# Patient Record
Sex: Female | Born: 1955 | Race: White | Hispanic: No | Marital: Married | Smoking: Current every day smoker
Health system: Southern US, Community
[De-identification: ages and names within clinical notes are randomized; demographics above are authoritative.]

## PROBLEM LIST (undated history)

## (undated) DIAGNOSIS — J302 Other seasonal allergic rhinitis: Secondary | ICD-10-CM

## (undated) DIAGNOSIS — L409 Psoriasis, unspecified: Secondary | ICD-10-CM

## (undated) DIAGNOSIS — Z85828 Personal history of other malignant neoplasm of skin: Secondary | ICD-10-CM

## (undated) DIAGNOSIS — E782 Mixed hyperlipidemia: Secondary | ICD-10-CM

## (undated) DIAGNOSIS — R112 Nausea with vomiting, unspecified: Secondary | ICD-10-CM

## (undated) DIAGNOSIS — E669 Obesity, unspecified: Secondary | ICD-10-CM

## (undated) DIAGNOSIS — F32A Depression, unspecified: Secondary | ICD-10-CM

## (undated) DIAGNOSIS — G35D Multiple sclerosis, unspecified: Secondary | ICD-10-CM

## (undated) DIAGNOSIS — F419 Anxiety disorder, unspecified: Secondary | ICD-10-CM

## (undated) DIAGNOSIS — M1990 Unspecified osteoarthritis, unspecified site: Secondary | ICD-10-CM

## (undated) DIAGNOSIS — T8859XA Other complications of anesthesia, initial encounter: Secondary | ICD-10-CM

## (undated) DIAGNOSIS — G4730 Sleep apnea, unspecified: Secondary | ICD-10-CM

## (undated) DIAGNOSIS — K8020 Calculus of gallbladder without cholecystitis without obstruction: Secondary | ICD-10-CM

## (undated) DIAGNOSIS — G43909 Migraine, unspecified, not intractable, without status migrainosus: Secondary | ICD-10-CM

## (undated) DIAGNOSIS — T7840XA Allergy, unspecified, initial encounter: Secondary | ICD-10-CM

## (undated) DIAGNOSIS — E119 Type 2 diabetes mellitus without complications: Secondary | ICD-10-CM

## (undated) DIAGNOSIS — K219 Gastro-esophageal reflux disease without esophagitis: Secondary | ICD-10-CM

## (undated) DIAGNOSIS — J449 Chronic obstructive pulmonary disease, unspecified: Secondary | ICD-10-CM

## (undated) DIAGNOSIS — R531 Weakness: Secondary | ICD-10-CM

## (undated) DIAGNOSIS — Z9889 Other specified postprocedural states: Secondary | ICD-10-CM

## (undated) DIAGNOSIS — G629 Polyneuropathy, unspecified: Secondary | ICD-10-CM

## (undated) DIAGNOSIS — G35 Multiple sclerosis: Secondary | ICD-10-CM

## (undated) DIAGNOSIS — G709 Myoneural disorder, unspecified: Secondary | ICD-10-CM

## (undated) DIAGNOSIS — R7303 Prediabetes: Secondary | ICD-10-CM

## (undated) DIAGNOSIS — C801 Malignant (primary) neoplasm, unspecified: Secondary | ICD-10-CM

## (undated) HISTORY — DX: Multiple sclerosis, unspecified: G35.D

## (undated) HISTORY — DX: Gastro-esophageal reflux disease without esophagitis: K21.9

## (undated) HISTORY — DX: Mixed hyperlipidemia: E78.2

## (undated) HISTORY — DX: Other seasonal allergic rhinitis: J30.2

## (undated) HISTORY — DX: Migraine, unspecified, not intractable, without status migrainosus: G43.909

## (undated) HISTORY — DX: Calculus of gallbladder without cholecystitis without obstruction: K80.20

## (undated) HISTORY — DX: Chronic obstructive pulmonary disease, unspecified: J44.9

## (undated) HISTORY — DX: Sleep apnea, unspecified: G47.30

## (undated) HISTORY — DX: Obesity, unspecified: E66.9

## (undated) HISTORY — DX: Allergy, unspecified, initial encounter: T78.40XA

## (undated) HISTORY — DX: Multiple sclerosis: G35

## (undated) HISTORY — DX: Depression, unspecified: F32.A

## (undated) HISTORY — PX: OTHER SURGICAL HISTORY: SHX169

## (undated) HISTORY — DX: Prediabetes: R73.03

## (undated) SURGERY — COLONOSCOPY
Anesthesia: Moderate Sedation

---

## 1982-08-11 HISTORY — PX: ABDOMINAL HYSTERECTOMY: SHX81

## 1983-08-12 HISTORY — PX: OTHER SURGICAL HISTORY: SHX169

## 1988-08-11 HISTORY — PX: OTHER SURGICAL HISTORY: SHX169

## 1997-08-11 HISTORY — PX: CHOLECYSTECTOMY: SHX55

## 1998-05-07 ENCOUNTER — Inpatient Hospital Stay (HOSPITAL_COMMUNITY): Admission: EM | Admit: 1998-05-07 | Discharge: 1998-05-10 | Payer: Self-pay | Admitting: Emergency Medicine

## 1998-11-21 ENCOUNTER — Encounter (HOSPITAL_COMMUNITY): Admission: RE | Admit: 1998-11-21 | Discharge: 1999-02-19 | Payer: Self-pay | Admitting: *Deleted

## 2000-07-25 ENCOUNTER — Encounter: Payer: Self-pay | Admitting: *Deleted

## 2000-07-25 ENCOUNTER — Ambulatory Visit (HOSPITAL_COMMUNITY): Admission: RE | Admit: 2000-07-25 | Discharge: 2000-07-25 | Payer: Self-pay | Admitting: *Deleted

## 2001-08-11 HISTORY — PX: KNEE ARTHROSCOPY: SUR90

## 2003-08-25 ENCOUNTER — Ambulatory Visit (HOSPITAL_COMMUNITY): Admission: RE | Admit: 2003-08-25 | Discharge: 2003-08-25 | Payer: Self-pay | Admitting: Internal Medicine

## 2004-08-13 ENCOUNTER — Emergency Department (HOSPITAL_COMMUNITY): Admission: EM | Admit: 2004-08-13 | Discharge: 2004-08-13 | Payer: Self-pay | Admitting: Emergency Medicine

## 2004-09-19 ENCOUNTER — Ambulatory Visit: Payer: Self-pay | Admitting: Family Medicine

## 2004-10-03 ENCOUNTER — Ambulatory Visit: Payer: Self-pay | Admitting: Family Medicine

## 2004-10-08 ENCOUNTER — Ambulatory Visit: Payer: Self-pay | Admitting: Family Medicine

## 2004-10-11 ENCOUNTER — Ambulatory Visit: Payer: Self-pay | Admitting: Family Medicine

## 2004-10-24 ENCOUNTER — Ambulatory Visit: Payer: Self-pay | Admitting: Family Medicine

## 2005-12-10 ENCOUNTER — Encounter: Payer: Self-pay | Admitting: *Deleted

## 2006-08-11 HISTORY — PX: SHOULDER SURGERY: SHX246

## 2006-12-27 ENCOUNTER — Encounter: Admission: RE | Admit: 2006-12-27 | Discharge: 2006-12-27 | Payer: Self-pay | Admitting: Neurology

## 2007-06-17 ENCOUNTER — Ambulatory Visit: Payer: Self-pay | Admitting: *Deleted

## 2007-07-29 ENCOUNTER — Ambulatory Visit: Payer: Self-pay | Admitting: *Deleted

## 2008-08-11 HISTORY — PX: OTHER SURGICAL HISTORY: SHX169

## 2008-08-14 ENCOUNTER — Encounter: Admission: RE | Admit: 2008-08-14 | Discharge: 2008-08-14 | Payer: Self-pay | Admitting: Sports Medicine

## 2008-09-14 ENCOUNTER — Inpatient Hospital Stay (HOSPITAL_COMMUNITY): Admission: RE | Admit: 2008-09-14 | Discharge: 2008-09-17 | Payer: Self-pay | Admitting: Orthopedic Surgery

## 2010-11-26 LAB — CROSSMATCH
ABO/RH(D): O POS
Antibody Screen: NEGATIVE

## 2010-11-26 LAB — COMPREHENSIVE METABOLIC PANEL
Albumin: 3.5 g/dL (ref 3.5–5.2)
BUN: 8 mg/dL (ref 6–23)
Calcium: 9.2 mg/dL (ref 8.4–10.5)
Chloride: 106 mEq/L (ref 96–112)
Creatinine, Ser: 0.87 mg/dL (ref 0.4–1.2)
GFR calc Af Amer: >60 mL/min (ref >60)
Total Bilirubin: 0.5 mg/dL (ref 0.3–1.2)
Total Protein: 5.9 g/dL — ABNORMAL LOW (ref 6.0–8.3)

## 2010-11-26 LAB — BASIC METABOLIC PANEL
BUN: 6 mg/dL (ref 6–23)
BUN: 6 mg/dL (ref 6–23)
Chloride: 100 mEq/L (ref 96–112)
Chloride: 100 mEq/L (ref 96–112)
Chloride: 99 mEq/L (ref 96–112)
Creatinine, Ser: 0.75 mg/dL (ref 0.4–1.2)
Creatinine, Ser: 0.76 mg/dL (ref 0.4–1.2)
GFR calc Af Amer: >60 mL/min (ref >60)
GFR calc non Af Amer: >60 mL/min (ref >60)
Glucose, Bld: 137 mg/dL — ABNORMAL HIGH (ref 70–99)
Potassium: 3.9 mEq/L (ref 3.5–5.1)

## 2010-11-26 LAB — CBC
HCT: 33.6 % — ABNORMAL LOW (ref 36.0–46.0)
HCT: 43.2 % (ref 36.0–46.0)
MCHC: 33.3 g/dL (ref 30.0–36.0)
MCV: 90.5 fL (ref 78.0–100.0)
MCV: 90.5 fL (ref 78.0–100.0)
MCV: 90.6 fL (ref 78.0–100.0)
MCV: 90.9 fL (ref 78.0–100.0)
Platelets: 187 10*3/uL (ref 150–400)
Platelets: 188 10*3/uL (ref 150–400)
Platelets: 208 10*3/uL (ref 150–400)
RBC: 3.6 MIL/uL — ABNORMAL LOW (ref 3.87–5.11)
RDW: 12 % (ref 11.5–15.5)
RDW: 12.4 % (ref 11.5–15.5)
WBC: 7.9 10*3/uL (ref 4.0–10.5)
WBC: 8.8 10*3/uL (ref 4.0–10.5)

## 2010-11-26 LAB — URINALYSIS, ROUTINE W REFLEX MICROSCOPIC
Bilirubin Urine: NEGATIVE
Hgb urine dipstick: NEGATIVE
Protein, ur: NEGATIVE mg/dL
Urobilinogen, UA: 0.2 mg/dL (ref 0.0–1.0)

## 2010-11-26 LAB — DIFFERENTIAL
Basophils Absolute: 0 10*3/uL (ref 0.0–0.1)
Lymphocytes Relative: 33 % (ref 12–46)
Lymphs Abs: 1.9 10*3/uL (ref 0.7–4.0)
Monocytes Absolute: 0.3 10*3/uL (ref 0.1–1.0)
Neutro Abs: 3.3 10*3/uL (ref 1.7–7.7)

## 2010-11-26 LAB — APTT: aPTT: 30 seconds (ref 24–37)

## 2010-12-09 ENCOUNTER — Emergency Department (HOSPITAL_COMMUNITY): Payer: 59

## 2010-12-09 ENCOUNTER — Inpatient Hospital Stay (HOSPITAL_COMMUNITY): Payer: 59

## 2010-12-09 ENCOUNTER — Encounter (HOSPITAL_COMMUNITY): Payer: Self-pay | Admitting: Radiology

## 2010-12-09 ENCOUNTER — Inpatient Hospital Stay (HOSPITAL_COMMUNITY)
Admission: EM | Admit: 2010-12-09 | Discharge: 2010-12-10 | DRG: 313 | Disposition: A | Discharge status: Home / Self Care | Payer: 59 | Attending: Internal Medicine | Admitting: Internal Medicine

## 2010-12-09 DIAGNOSIS — Z7982 Long term (current) use of aspirin: Secondary | ICD-10-CM

## 2010-12-09 DIAGNOSIS — R110 Nausea: Secondary | ICD-10-CM | POA: Diagnosis present

## 2010-12-09 DIAGNOSIS — Z880 Allergy status to penicillin: Secondary | ICD-10-CM

## 2010-12-09 DIAGNOSIS — J4489 Other specified chronic obstructive pulmonary disease: Secondary | ICD-10-CM | POA: Diagnosis present

## 2010-12-09 DIAGNOSIS — K219 Gastro-esophageal reflux disease without esophagitis: Secondary | ICD-10-CM | POA: Diagnosis present

## 2010-12-09 DIAGNOSIS — R0789 Other chest pain: Principal | ICD-10-CM | POA: Diagnosis present

## 2010-12-09 DIAGNOSIS — Z87891 Personal history of nicotine dependence: Secondary | ICD-10-CM

## 2010-12-09 DIAGNOSIS — E7800 Pure hypercholesterolemia, unspecified: Secondary | ICD-10-CM | POA: Diagnosis present

## 2010-12-09 DIAGNOSIS — Z96659 Presence of unspecified artificial knee joint: Secondary | ICD-10-CM

## 2010-12-09 DIAGNOSIS — J449 Chronic obstructive pulmonary disease, unspecified: Secondary | ICD-10-CM | POA: Diagnosis present

## 2010-12-09 LAB — CBC
HCT: 38.1 % (ref 36.0–46.0)
Hemoglobin: 13 g/dL (ref 12.0–15.0)
MCV: 87.8 fL (ref 78.0–100.0)
RBC: 4.34 MIL/uL (ref 3.87–5.11)
RDW: 11.8 % (ref 11.5–15.5)
WBC: 7.9 10*3/uL (ref 4.0–10.5)

## 2010-12-09 LAB — COMPREHENSIVE METABOLIC PANEL
Alkaline Phosphatase: 69 U/L (ref 39–117)
BUN: 9 mg/dL (ref 6–23)
CO2: 25 mEq/L (ref 19–32)
Chloride: 106 mEq/L (ref 96–112)
GFR calc non Af Amer: >60 mL/min (ref >60)
Glucose, Bld: 99 mg/dL (ref 70–99)
Potassium: 3.5 mEq/L (ref 3.5–5.1)
Total Bilirubin: 0.4 mg/dL (ref 0.3–1.2)

## 2010-12-09 LAB — CARDIAC PANEL(CRET KIN+CKTOT+MB+TROPI)
Relative Index: INVALID (ref 0.0–2.5)
Total CK: 34 U/L (ref 7–177)
Troponin I: 0.02 ng/mL (ref 0.00–0.06)

## 2010-12-09 LAB — URINALYSIS, ROUTINE W REFLEX MICROSCOPIC
Glucose, UA: NEGATIVE mg/dL
Hgb urine dipstick: NEGATIVE
Ketones, ur: NEGATIVE mg/dL
Protein, ur: NEGATIVE mg/dL
pH: 8 (ref 5.0–8.0)

## 2010-12-09 LAB — CK TOTAL AND CKMB (NOT AT ARMC): CK, MB: 0.6 ng/mL (ref 0.3–4.0)

## 2010-12-09 LAB — POCT CARDIAC MARKERS: Myoglobin, poc: 43.2 ng/mL (ref 12–200)

## 2010-12-09 LAB — RAPID URINE DRUG SCREEN, HOSP PERFORMED
Barbiturates: NOT DETECTED
Benzodiazepines: NOT DETECTED
Cocaine: NOT DETECTED

## 2010-12-09 LAB — TROPONIN I: Troponin I: 0.01 ng/mL (ref 0.00–0.06)

## 2010-12-09 LAB — HEMOGLOBIN A1C
Hgb A1c MFr Bld: 6.5 % — ABNORMAL HIGH (ref <5.7)
Mean Plasma Glucose: 140 mg/dL — ABNORMAL HIGH (ref <117)

## 2010-12-09 MED ORDER — IOHEXOL 300 MG/ML  SOLN
100.0000 mL | Freq: Once | INTRAMUSCULAR | Status: AC | PRN
  Administered 2010-12-09: 100 mL via INTRAVENOUS

## 2010-12-09 NOTE — H&P (Signed)
[name], [name]               ACCOUNT NO.:  [id number]  MEDICAL RECORD NO.:  [id number]           PATIENT TYPE:  E  LOCATION:  [facility]                         FACILITY:  [facility]  PHYSICIAN:  Michelle Stable, MD   DATE OF BIRTH:  [birth year]  DATE OF ADMISSION:  12/09/2010 DATE OF DISCHARGE:                             HISTORY & PHYSICAL   PRIMARY CARE PHYSICIAN:  [name], M.D. from [facility].  CHIEF COMPLAINT:  Chest pain.  HISTORY OF PRESENT ILLNESS:  [name] is a 55 year old woman with past medical history significant for prior tobacco use (approximately 30 pack year history) who presents with chief complaint of chest pain.  The patient states that pain began on Saturday, two days prior to admission. She states it only lasted a couple of minutes.  This apparently happened on and off throughout the week and up until this morning.  She states this morning she woke up and developed the chest pain.  The chest pain is described as a brick sitting on her chest.  It is currently approximately a 7/10 and has been down as low as 4/10.  Although the waxing and waning has been constant all day today, she has not noticed any aggravating or relieving factors.  She did have some shortness of breath associated this morning.  She also has what she describes to be a funny feeling in her left arm and further described as heaviness.  She did report throwing up her breakfast this morning, but has not had any nausea or vomiting, otherwise.  Of note, the patient has not had these symptoms before.  She has not noticed any aggravating or relieving factors as mentioned.  Furthermore, she denies any current abdominal pain, palpitations, nausea, vomiting, shortness of breath, lower extremity pain.  PAST MEDICAL HISTORY: 1. Osteoarthritis status post right total knee replacement     approximately 2 years ago. 2. Questionable history of MS. 3. Admission to [facility] in  November 2011 with diagnosis of     respiratory failure.  The patient states that her physicians have     told her they think it is COPD and are currently treating her for     this, although there are no PFTs or echocardiogram done per the     patient's report.  MEDICATIONS: 1. Daliresp 500 mcg p.o. daily. 2. Neurontin 600 mg p.o. four times a day. 3. Lasix questionable 20 mg p.o. daily. 4. Nexium 40 mg p.o. daily. 5. Multivitamin p.o. daily.  ALLERGIES:  PENICILLIN.  SOCIAL HISTORY:  The patient is married and lives in [place]. Her husband's name is [name] and his cell phone numbers [phone] and home phone [phone].  The patient has a history of tobacco use, but quit in December 2011.  Again, she smoked a pack per day since her teenage years and has approximately a 30 pack-year history.  She denies any alcohol or drug use.  FAMILY HISTORY:  Positive for atrial fibrillation and question of congestive heart failure on her father side.  Both of her grandmothers (both maternal and paternal) had history of strokes.  REVIEW OF  SYSTEMS:  As per HPI.  All others reviewed and negative.  PHYSICAL EXAMINATION:  VITAL SIGNS:  Temperature 98.2, blood pressure 124/75, heart rate of 66, respirations 20, oxygen saturation 99% on room air. GENERAL:  This is an obese woman lying in bed who appears uncomfortable, but in no acute distress. HEENT:  Head is normocephalic and atraumatic.  Pupils are equally round and reactive to light and accommodation.  Extraocular movements are intact.  Sclerae anicteric.  Mucous membranes are moist. NECK:  Thick and unable to assess for JVD.  There is no carotid bruit. There is no palpable thyromegaly. HEART:  There is a normal S1 and S2 with a regular rate and rhythm. There are no murmurs, gallops, or rubs. LUNGS:  There is good air movement bilaterally and clear to auscultation. ABDOMEN:  Positive bowel sounds, soft, nontender, nondistended  with no guarding, abdomen is obese. EXTREMITIES:  There is no cyanosis, clubbing, or edema.  There is a remote scar over the right knee status post total knee replacement.  Of note, there is no calf tenderness or Homans sign. NEUROLOGIC:  The patient is awake, alert, and oriented x3.  Cranial nerves II through XII are intact.  Motor is intact.  Sensation is intact.  LABORATORY DATA:  WBC 7.9, hemoglobin 13, platelets 219.  Sodium 139, potassium 3.5, chloride 106, bicarb 25, glucose 99, BUN 9, creatinine 0.74.  LFTs are within normal limits.  Point-of-care cardiac enzymes with a CK-MB less than 1.0, troponin-I less than 0.05, myoglobin 43.2. Urinalysis is negative.  IMAGING: 1. Chest x-ray, impression, no acute findings. 2. Category electrocardiogram, impression, normal sinus rhythm without     any acute ischemic findings.  ASSESSMENT AND PLAN: 1. Chest pain.  The patient describes some classic chest pain     description as noted above.  Overall, she is low risk for acute     coronary syndrome given her past medical history, sex, and age.     Currently, patient has had two doses of nitroglycerin with some     improvement, but no resolution of symptoms.  At this point, we will     give her another dose in the emergency department along with some     morphine.  If chest pain fully resolves, we will go ahead and admit     to telemetry bed, to rule out acute coronary syndrome.  If chest     pain does not resolve, we will go ahead and admit to step-down unit     with a heparin drip and nitroglycerin.  We will go ahead and     initiate aspirin 325 mg daily.  We will go ahead and risk stratify     with a D-dimer if she is low risk for PE, but has been having some     shortness of breath she reports for a few months.  I will go ahead     and obtain a 2-D echocardiogram which will assess the left     ventricular function along with the PA pressures given this     reported history of  shortness of breath along with history of     respiratory failure and questionable COPD.  Currently there are no     other clear etiologies to explain the chest pain. 2. Questionable chronic obstructive pulmonary disease.  The patient     reports that she has had this diagnosis, but without any PFTs.  She     is currently on  Daliresp 500 mcg, but she is not on any inhalers     per her report.  At this point, we will hold off on the Daliresp as     we will likely be using nitroglycerin if her chest pain continues. 3. Code Status.  Patient states she has a DNR at home.  However, this     is as an outpatient and in the setting of an irreversible process.      After discussion, she will be a FULL Code while inpatient with limited     trial if reversible process. Husband knows her wishes and has papers     (advance directives) at home and they are agreeable. Her husband's name      is [name] and his cell phone numbers [phone] and home phone [phone].    Michelle Stable, MD     MA/MEDQ  D:  12/09/2010  T:  12/09/2010  Job:  [id number]  cc:   [name], M.D.  Electronically Signed by Michelle Stable MD on 12/09/2010 02:72:53 PM

## 2010-12-10 LAB — LIPID PANEL
Cholesterol: 218 mg/dL — ABNORMAL HIGH (ref 0–200)
HDL: 34 mg/dL — ABNORMAL LOW (ref >39)
LDL Cholesterol: 139 mg/dL — ABNORMAL HIGH (ref 0–99)
Total CHOL/HDL Ratio: 6.4 RATIO
Triglycerides: 225 mg/dL — ABNORMAL HIGH (ref <150)
VLDL: 45 mg/dL — ABNORMAL HIGH (ref 0–40)

## 2010-12-10 LAB — CARDIAC PANEL(CRET KIN+CKTOT+MB+TROPI): Relative Index: INVALID (ref 0.0–2.5)

## 2010-12-20 ENCOUNTER — Encounter: Payer: 59 | Admitting: Cardiovascular Disease

## 2010-12-22 NOTE — Discharge Summary (Signed)
[name], [name]               ACCOUNT NO.:  [id number]  MEDICAL RECORD NO.:  [id number]           PATIENT TYPE:  I  LOCATION:  3729                         FACILITY:  [facility]  PHYSICIAN:  Osvaldo Shipper, MD     DATE OF BIRTH:  [birth year]  DATE OF ADMISSION:  12/09/2010 DATE OF DISCHARGE:  12/10/2010                              DISCHARGE SUMMARY   PRIMARY CARE PHYSICIAN:  Ernestina Penna, MD  No consultations obtained during this admission.  IMAGING STUDIES: 1. Chest x-ray, which showed no acute findings. 2. CT angio of the chest did not show any PE. 3. Echocardiogram was done, however, report is pending.  PERTINENT LABS:  CBC was unremarkable as was the CMET.  HbA1c was 6.5. D-dimer 0.84.  Cardiac enzymes were negative x3.  BNP was 43.  Total cholesterol 218, triglyceride 225, LDL 139, HDL 34.  TSH 3.669.  Urine drug screen negative.  UA was unremarkable.  DISCHARGE DIAGNOSES: 1. Chest pain, etiology unclear, ruled out for acute coronary     syndrome. 2. Hypercholesterolemia requiring treatment. 3. History of acid reflux disease. 4. History of chronic obstructive pulmonary disease.  BRIEF HOSPITAL COURSE:  Briefly, this is a 55 year old Caucasian female who presented to the hospital with complaints of chest pain, which had been ongoing for about 3 days or so.  The patient presented to the ED with these complaints.  The patient underwent an EKG, which did not show any acute abnormalities.  The patient was subsequently admitted because of her few risk factors.  The patient has ruled out acute coronary syndrome.  Her D-dimer was mildly elevated for which she underwent CT angio, which did not show any acute PE.  EKG was repeated, which is essentially unchanged from the first EKG.  Overnight, her pain has resolved.  She got some Phenergan for nausea overnight.  She does have high cholesterol based on the levels reported above.  Because she is ruled out for acute coronary  syndrome and her chest pain is improved, she is considered stable for discharge.  I have asked her to follow up with Dr. Christell Constant so that he can consider referral to a cardiologist for a stress test.  Rest of her medical issues are stable and she tells me that her acid reflux is well controlled with Nexium.  PHYSICAL EXAMINATION:  GENERAL:  On the day of discharge, the patient is feeling better.  She is complaining of feeling tired at times, but denies any chest pain. VITAL SIGNS:  All stable.  Blood pressure is 108/71. LUNGS:  Clear to auscultation bilaterally with no wheezing, rales, or rhonchi. CARDIOVASCULAR:  S1 and S2 is normal.  Regular.  No S3, S4, rubs, murmurs, or bruit. ABDOMEN:  Soft, nontender, and nondistended.  Bowel sounds are present. No masses or organomegaly is appreciated. NEUROLOGIC:  She is alert and oriented x3.  No focal neurological deficits are present.  DISCHARGE MEDICATIONS: 1. Aspirin 81 mg daily. 2. Simvastatin 20 mg daily. 3. Albuterol inhaler 2 puffs every 4 hours as needed for shortness of breath. 4. Daliresp 1 tablet p.o. daily. 5. Gabapentin 600 mg  four times daily. 6. Lasix 20 mg daily. 7. Multivitamins over-the-counter 1 tablet daily. 8. Nexium 40 mg daily.  DIET:  Heart-healthy.  Physical activity as tolerated.  No exertion until seen by Dr. Christell Constant.  Once again Dr. Christell Constant to please note that echocardiogram was done, however, results are not available.  It was done late in the afternoon, so results cannot be available until tomorrow.  Please follow up.  TOTAL TIME ON THIS DISCHARGE ENCOUNTER:  35 minutes.   Osvaldo Shipper, MD     GK/MEDQ  D:  12/10/2010  T:  12/11/2010  Job:  [id number]  cc:   Ernestina Penna, M.D.  Electronically Signed by Osvaldo Shipper MD on 12/22/2010 10:12:31 PM

## 2010-12-24 NOTE — Consult Note (Signed)
VASCULAR SURGERY CONSULTATION   [name], DELEO H  DOB:  21-May-1956                                       06/17/2007  ZOXWR#:[id number]   REFERRING PHYSICIAN:  Ernestina Penna, M.D., [facility].   CHIEF COMPLAINT:  Bilateral numbness of the feet.   HISTORY:  Patient is a 55 year old female who describes a burning pain  and discomfort of the feet along with numbness with ambulation.  This is  more severe in her right leg than the left.  It has been present for  approximately a year and a half.  She can walk approximately 100 yards  prior to developing this discomfort.  The pain is relieved by rest and  sitting down.  She is able to walk with discomfort up to a 1-1/2 to 2  miles.  She does note some ankle swelling.   Risk factors for cardiovascular disease include tobacco use, 3/4 pack  daily.  No history of heart disease, hyperlipidemia, diabetes, or  hypertension.  She does have a history of multiple sclerosis.  Also, a  C2-3 disk problem.   PAST MEDICAL HISTORY:  1. Multiple sclerosis.  2. Degenerative cervical disk disease, C2-3.   MEDICATIONS:  1. Lexapro 10 mg daily.  2. Multivitamin 1 tablet daily.  3. Coenzyme Q 1 tablet daily.  4. Joint Solutions 3 tablets daily.   FAMILY HISTORY:  Mother is living at age 76, generally well.  Father  deceased, age 39, with a history of malabsorption, atrial fibrillation,  and congestive heart failure.  She has two brothers and one sister, all  generally well.   Patient is married with two children.  She is on long term disability  due to MS.  She works part-time as a Research scientist (medical).  She smokes 3/4 of a  pack of cigarettes daily.  Does not consume alcohol.   ALLERGIES:  PENICILLIN, ASPIRIN.   REVIEW OF SYSTEMS:  Positive findings include weight gain, reflux,  intermittent diarrhea, and constipation.  Pain in her feet with lying  flat.  Chronic headaches, joint discomfort, muscle pain.   Depression and  nervousness.  Denies cardiac, pulmonary, genitourinary, hearing, vision,  or bleeding problems.  (Refer to patient encounter form).   PHYSICAL EXAMINATION:  Moderately obese 55 year old female who appears  her stated age.  No acute distress.  No cyanosis, pallor, or jaundice.  Vital signs:  BP 122/73, pulse 83 per minute, respirations 18 per  minute, O2 sat 95%.  HEENT:  Mouth and throat are clear.  Normocephalic.  Extraocular movements are intact.  Neck:  Supple.  No thyromegaly or  adenopathy.  Cardiovascular:  Normal heart sounds without murmurs.  No  carotid bruits.  Lower extremity pulses:  Femoral and popliteal 2+  bilaterally.  Dorsalis pedis and posterior tibial 1+ bilaterally.  Chest:  Air entry equal bilaterally.  No rales or rhonchi.  Distant  breath sounds.  Abdomen:  Soft.  No distention.  No tenderness.  No  masses or organomegaly.  Normal bowel sounds without bruits.  Extremities:  Normal range of motion.  Ankle edema 1+ bilaterally.  Neurologic:  Patient is alert and oriented.  Cranial nerves are normal.  Strength is equal bilaterally.  Reflexes are 1+ bilaterally.   INVESTIGATIONS:  Lower extremity Doppler evaluation carried out today  revealed ankle brachial indices of  0.89, right leg, 0.97, left leg.  Waveforms are biphasic in the tibials bilaterally.  Toe waveforms are  normal bilaterally.   IMPRESSION:  1. Bilateral exercise-induced leg pain and numbness, relieved by rest.  2. Multiple sclerosis.  3. Degenerative cervical disk disease, C2-3.   RECOMMENDATIONS:  Although the patient has normal Doppler and evaluation  at rest, symptoms are very suspicious for possible claudication.  Will  schedule for CT angiogram of the abdomen with bilateral lower runoff  arteriograms.  This can be performed at [facility]  with return visit to see me.  Following this, we will plan intervention  if significant lesion is identified.   Patient counseled  regarding smoking cessation.   Balinda Quails, M.D.  Electronically Signed  PGH/MEDQ  D:  06/17/2007  T:  06/18/2007  Job:  [id number]

## 2010-12-24 NOTE — Op Note (Signed)
[name], [name]               ACCOUNT NO.:  [id number]   MEDICAL RECORD NO.:  [id number]          PATIENT TYPE:  INP   LOCATION:  0001                         [facility]ACILITY[facility]   PHYSICIAN:  John L. Rendall, M.D.  DATE OF BIRTH:  [birth year]   DATE OF PROCEDURE:  09/14/2008  DATE OF DISCHARGE:                               OPERATIVE REPORT   PREOPERATIVE DIAGNOSIS:  End-stage osteoarthritis, right knee.   SURGICAL PROCEDURES:  Right LCS total knee replacement.   POSTOPERATIVE DIAGNOSIS:  End-stage osteoarthritis, right knee.   SURGEON:  John L. Rendall, M.D.   ASSISTANT:  Skip Mayer, [facility]   ANESTHESIA:  Spinal with femoral nerve block.   PATHOLOGY:  The patient has had years of medial knee pain and has  radiographic evidence of bone against bone medially.  This has been  resistant to all conservative measures and pain is the indication for  the procedure.  The computer was used because of the varus seen.   PROCEDURE:  Under spinal anesthesia the right leg was prepared with  DuraPrep and draped as a sterile field with a leg holder.  Standard prep  and drape is done and the sterile tourniquet was placed proximally.  The  leg was wrapped out with the Esmarch and tourniquet is used at 350 mmHg.  Midline incision is made.  The patella was everted.  The knee is  debrided in preparation for computer mapping.  Once the debridement is  complete, two Schanz pins were placed in the superior medial tibia  through accessory stab wounds and in the distal medial femur.  The  arrays were set up, the femoral head was identified.  Medial and lateral  malleoli are identified.  Proximal tibia and distal femur are mapped.  Overall alignment was then checked.  There is a small varus deformity  and small flexion contracture.  At this point the tibial guide is used  for proximal tibial resection 10 mm off the high side.  Once this was  completed the tensioner is used and the ligaments were  balanced within  approximately 1 degree of anatomic alignment.  The first femoral cut was  then made taking off the anterior and posterior flare of the distal  femur.  This is done within 1 degree of anatomic rotation.  The distal  femoral cut is then made, flexion and extension gaps were balanced at a  slightly snug 12.5 mm.  Debridement of the menisci and cruciates was  done.  Spurs were taken off the back of the femoral condyle.  The  recessing guide is then used.  Once the femur is complete, attention was  turned to the tibia.  It is sized as a #4.  Center peg hole with keel  was placed.  Trial reduction of a #4 tibia, 10 bearing and standard plus  femur reveals slight laxity in extension and flexion 12.5 bearing  actually gives excellent stability and alignment, both in flexion and  extension.  It within 1 degree of anatomic alignment on the computer and  within a degree of full extension.  At this  point, the arrays were taken  down, the bony surfaces were prepared with pulse irrigation.  The  patella was osteotomized.  Permanent components were obtained and  cemented in place.  Once the cement was hardened the tourniquet is let  down at approximately an hour and 5 minutes.  Multiple small vessels  were cauterized.  The knee was then closed in layers with #1 Tycron, #1  Vicryl, 2-0 Vicryl and skin clips.  The patient tolerated the procedure  well and returned to recovery in good condition.  A medium Hemovac drain  was inserted.  Blood loss less 100 mL.      John L. Rendall, M.D.  Electronically Signed     JLR/MEDQ  D:  09/14/2008  T:  09/14/2008  Job:  [id number]

## 2010-12-24 NOTE — Assessment & Plan Note (Signed)
OFFICE VISIT   [name], [name]  DOB:  May 31, 1956                                       07/29/2007  ZOXWR#:[id number]   The patient returns today with the results of her CT aortogram with  bilateral lower extremity runoff.  This revealed no evidence of  significant arterial occlusive disease.  Minimal disease is present in  the right common femoral artery.   The patient was informed of these results.   She continues to have some numbness in the right foot.   BP 103/71, pulse 82 per minute and regular, respirations 18 per minute.   I have informed the patient of these results and have recommended no  further workup.  Return p.r.n.   Balinda Quails, M.D.  Electronically Signed   PGH/MEDQ  D:  07/29/2007  T:  07/30/2007  Job:  [id number]   cc:   Ernestina Penna, M.D.

## 2010-12-27 NOTE — H&P (Signed)
[name], [name]                           ACCOUNT NO.:  [id number]   MEDICAL RECORD NO.:  [id number]                  PATIENT TYPE:   LOCATION:                                       FACILITY:   PHYSICIAN:  Lionel December, M.D.                 DATE OF BIRTH:  February 09, 1956   DATE OF ADMISSION:  DATE OF DISCHARGE:                                HISTORY & PHYSICAL   PHYSICIAN:  Self referral.   CHIEF COMPLAINT:  Abdominal pain.   HISTORY OF PRESENT ILLNESS:  [name] is a 55 year old Caucasian female who  has a two- to three-month history of epigastric abdominal pain associated  with abdominal bloating and discomfort.  She has had these symptoms  intermittently for the last 2-3 months.  Symptoms seem to be worse  postprandially.  She also notes some epigastric burning and nausea but no  vomiting.  She has had chronic reflux for more than 5 years.  She is on  Prilosec OTC with good control of her typical reflux symptoms.  Prilosec  seems to help her morning abdominal pain and nausea but symptoms gradually  get worse throughout the day.  She denies any dysphagia, odynophagia,  melena, or rectal bleeding. She typically has anywhere from 0-3 bowel  movements daily.  She feels like she may have hemorrhoids.  Denies any  NSAIDs or aspirin.  She has never had an EGD or colonoscopy.   CURRENT MEDICATIONS:  1. Prilosec OTC one daily.  2. Multivitamin daily.   ALLERGIES:  1. PENICILLIN.  2. ASPIRIN.   PAST MEDICAL HISTORY:  Multiple sclerosis.   PAST SURGICAL HISTORY:  1. Hysterectomy.  2. Left breast lumpectomy, benign.  3. Left carpal tunnel release.  4. She had a multi release from the right wrist.  5. She had a cholecystectomy for possible biliary dyskinesia.  No history of     stones.   FAMILY HISTORY:  Mother has dementia.  Paternal grandmother had rectal  cancer but doing well.  Maternal uncle died of esophageal cancer.  Father  has history of peptic ulcer disease status  post gastrectomy with two-thirds  of his stomach removed.  She has a brother with hepatitis C.   SOCIAL HISTORY:  She has been married for 28 years, has two children.  She  is a homemaker, is also on disability.  She smokes a half a pack of  cigarettes daily and has smoked for over 30 years.  Denies any alcohol use.   REVIEW OF SYSTEMS:  Please see HPI for GI.  GENERAL:  Intentional weight  loss of 50 pounds in the last 6 months, on Weight [employer].  CARDIOPULMONARY:  Denies any chest pain or shortness of breath.   PHYSICAL EXAMINATION:  VITAL SIGNS:  Weight 208, height 5 feet 11 inches.  Temperature 97.5, blood pressure 126/70, pulse 72.  GENERAL:  Pleasant, well-nourished, well-developed Caucasian female in no  acute distress.  SKIN:  Warm and dry, no jaundice.  HEENT:  Conjunctivae are pink, sclerae nonicteric.  Oropharyngeal mucosa  moist and pink; no lesions, erythema, or exudate.  No lymphadenopathy or  thyromegaly.  CHEST:  Lungs are clear to auscultation.  CARDIAC:  Regular rate and rhythm, normal S1, S2.  No murmurs, rubs, or  gallops.  ABDOMEN:  Positive bowel sounds, soft, nondistended.  She has mild  epigastric tenderness to deep palpation.  No organomegaly or masses.  RECTAL:  Revealed small external hemorrhoids.  No masses of the rectal  vault.  Secretions are hemoccult negative.  EXTREMITIES:  No edema.   IMPRESSION:  The patient is a 55 year old Caucasian female with two- to  three-month history of epigastric pain, worse postprandially.  She also has  chronic gastroesophageal reflux disease.  Symptoms may be due to peptic  ulcer disease or poorly controlled reflux.  Symptoms less likely due to a  biliary etiology.  Recommend upper endoscopy at this time to further  evaluate acute symptoms as well as chronic reflux (i.e. Barrett's  esophagus).   She also has a hemorrhoid on examination which is not erythematous and non  thrombosed.   PLAN:  1. EGD in the near  future.  2. CBC, LFTs, lipase.  3. Continue Prilosec for now.  4. Anusol HC suppositories #14 one per rectum q.h.s. for 14 days, 0 refills.  5. Recommend colonoscopy at age 55.     _____________________________________  ___________________________________________  Tana Coast, P.A.                      Lionel December, M.D.   LL/MEDQ  D:  08/22/2003  T:  08/22/2003  Job:  [id number]

## 2010-12-27 NOTE — Discharge Summary (Signed)
NAMEJANELI, [name]               ACCOUNT NO.:  [id number]   MEDICAL RECORD NO.:  [id number]          PATIENT TYPE:  INP   LOCATION:  1614                         FACILITY:  [facility]   PHYSICIAN:  John L. Rendall, M.D.  DATE OF BIRTH:  July 14, 1956   DATE OF ADMISSION:  09/14/2008  DATE OF DISCHARGE:  09/17/2008                               DISCHARGE SUMMARY   ADMISSION DIAGNOSES:  1. End-stage osteoarthritis right knee.  2. History of multiple sclerosis.  3. Gastroesophageal reflux disease.  4. Tobacco abuse.  5. History of migraines.   DISCHARGE DIAGNOSES:  1. End-stage osteoarthritis right knee status post right total knee      arthroplasty.  2. Acute blood loss anemia secondary to surgery.  3. Hypokalemia.  4. History of multiple sclerosis.  5. Gastroesophageal reflux disease.  6. Tobacco abuse.  7. History of migraines.   PROCEDURE:  Arthroplasty with computer navigation by Dr. Jonny Ruiz L.  Rendall assisted by, Skip Mayer, PA-C.  She had an LCS complete  primary femoral component cemented size standard plus right placed with  a DePuy NBT keel tibial tray cemented size 4.  An LCS complete RP insert  size standard plus 12.5-mm thickness and an LCS complete metal back  patella cemented size standard plus.   COMPLICATIONS:  None.   CONSULTS:  1. Physical therapy consult September 15, 2008.  2. Occupational therapy consult September 16, 2008.   HISTORY OF PRESENT ILLNESS:  This 55 year old white female patient  presented to Dr. Priscille Kluver with a 25-year history of sudden onset  progressive right knee pain.  The pain is constant ache diffuse about  the knee without radiation.  It increases with walking and decreases  with rest and elevation.  Occasional ibuprofen does help, but knee pops,  grinds, swells, keeps her up at night.  She has failed conservative  treatment and, because of that, she is presenting for a right knee  replacement.   HOSPITAL COURSE:  Ms. Ruehl tolerated  her surgical procedure well  without immediate complications.  She was transferred to the orthopedic  floor.  On postop day #1 T-max 98, vitals were stable.  Hemoglobin 11.2,  hematocrit 33.6.  Hemovac was draining a fair amount of drainage.  She  was started on therapy per protocol.   Postop day #2 T-max 100.5, vitals stable.  Hemoglobin 11.2, hematocrit  32.9.  Dressing was changed.  Hemovac discontinued.  Continued on  therapy per protocol and switched to p.o. pain meds.   Postop day #3 she was still doing well, had some blurred vision which  she attributed to her multiple sclerosis.  vitals were stable.  Hemoglobin stable 11 with hematocrit of 32.6.  Tolerating therapy and  ambulating well.  Because of that she was discharged home later that  day.   DISCHARGE INSTRUCTIONS:   DIET:  She can resume her regular prehospitalization diet.   MEDICATIONS:  1. She may resume her home meds as follows.  Prilosec 20 mg p.o. q.      a.m.  2. Additional meds at this time include Arixtra 2.5 mg subcu  q. 8 p.m.      with last dose on February 10.  3. She is to begin a daily aspirin baby aspirin once a day on February      11.  4. Robaxin 500 mg 1 p.o. q. 68 hours p.r.n. spasms.  5. Percocet 5/325 one to two p.o. q.4 to 6 h p.r.n. pain.  6. Colace 100 mg p.o. b.i.d.  7. MiraLax for constipation p.r.n.   ACTIVITY:  She out of bed weightbearing as tolerated on the right leg  with use of walker.  No lifting or driving for 6 weeks and increase  activity slowly.  She is to have home CPM 0 to 90 degrees 6 to 8 hours a  day and increase 5 to 10 degrees a day.  Please see the blue total knee  discharge sheet for further activity instructions.   WOUND CARE:  She is to keep the incision clean and dry.  Change the  dressing daily and see the blue discharge sheet for further wound care  instructions.   FOLLOW UP:  She needs to follow up with Dr. Priscille Kluver in our office in 10  to 14 days and is to  call [phone] for that appointment.   LABORATORY DATA:  Hemoglobin/hematocrit ranged from 14.4 and 43.2 on the  1st to 11 and 32.6 on the 7th.  Glucose ranged from 122 on the 1st to  137 on the 5th to 136 on the 7th.  Potassium went to a low of 3.3 on the  7th.   Calcium ranged from 9.2 on the first to 8.2 on the 6th to 8.4 on the 7th  and UA on the 1st showed small leukocyte esterase.  All other laboratory  studies were within normal limits.      Legrand Pitts Duffy, P.A.      John L. Rendall, M.D.  Electronically Signed    KED/MEDQ  D:  10/18/2008  T:  10/18/2008  Job:  [id number]

## 2010-12-27 NOTE — Op Note (Signed)
NAMENAOME, BRIGANDI                         ACCOUNT NO.:  [id number]   MEDICAL RECORD NO.:  [id number]                   PATIENT TYPE:  AMB   LOCATION:  DAY                                  FACILITY:  [facility]   PHYSICIAN:  Lionel December, M.D.                 DATE OF BIRTH:  [birth year]   DATE OF PROCEDURE:  08/25/2003  DATE OF DISCHARGE:                                 OPERATIVE REPORT   PROCEDURE:  Esophagogastroduodenoscopy.   ENDOSCOPIST:  Lionel December, M.D.   INDICATIONS:  [name] is a 55 year old Caucasian female with a several year  history of GERD who has been experiencing epigastric pain for the last 3-4  months.  She is on OTC Prilosec but not any better.  She was seen in the  office recently and had normal CBCs, LFTs, and lipase.  She is status post  cholecystectomy.  She is undergoing diagnostic EGD.  The procedure and risks  were reviewed with the patient and informed consent was obtained.   PREOPERATIVE MEDICATIONS:  Cetacaine spray for oropharyngeal topical  anesthesia, Demerol 50 mg IV and Versed 8 mg IV in divided dose.   FINDINGS:  Procedure performed in endoscopy suite.  The patient's vital  signs and O2 saturation were monitored during the procedure and remained  stable.  The patient was placed in the left lateral recumbent position and  Olympus videoscope was passed via the oropharynx without any difficulty into  the esophagus.   ESOPHAGUS:  Mucosa of the esophagus was normal.  Distally she had a single  erosion, just proximal to the gastroesophageal junction.  The  gastroesophageal junction was wavy with 2 tongues of gastric-type mucosa  less than a centimeter in length.  There was a 3 cm size sliding hiatal  hernia.   STOMACH:  It was empty and distended very well with insufflation.  The folds  of the proximal stomach were normal.  Examination of the mucosa revealed  patchy erythema and granularity at the antrum, but no erosions or ulcers  were noted.  The  pyloric channel was patent.  Angularis, fundus and cardia  were examined by retroflexing the scope and were normal.   DUODENUM:  Examination of the bulb and postbulbar duodenum was normal.   Endoscope was withdrawn.  The patient tolerated the procedure well.   FINAL DIAGNOSES:  1. Erosive reflux esophagitis with small sliding hiatal hernia.  2  Wave GE junction with 2 tongues of gastric-type mucosa.  1. Nonerosive antral gastritis.   RECOMMENDATIONS:  1. H. pylori serology will be checked.  We will increase her Prilosec to 20     mg b.i.d..  2. Levosin SL t.i.d. p.r.n.  3. If H. pylori is negative and she does not improve with this treatment     plan, and she does not improve with therapy we will bring her back for     abdominal CT.  I would also recommend repeating the EGD 5 years from now     to make sure that she has not developed full-blown Barrett's esophagus.      ___________________________________________                                            Lionel December, M.D.   NR/MEDQ  D:  08/25/2003  T:  08/25/2003  Job:  [id number]   cc:   Colon Flattery, MD  [address]  [place]  [place] [address]  Fax: [phone]

## 2011-01-07 ENCOUNTER — Ambulatory Visit (INDEPENDENT_AMBULATORY_CARE_PROVIDER_SITE_OTHER): Payer: 59 | Admitting: Internal Medicine

## 2011-01-07 ENCOUNTER — Other Ambulatory Visit (INDEPENDENT_AMBULATORY_CARE_PROVIDER_SITE_OTHER): Payer: Self-pay | Admitting: Internal Medicine

## 2011-01-07 DIAGNOSIS — R1310 Dysphagia, unspecified: Secondary | ICD-10-CM

## 2011-01-07 DIAGNOSIS — T17308A Unspecified foreign body in larynx causing other injury, initial encounter: Secondary | ICD-10-CM

## 2011-01-10 ENCOUNTER — Ambulatory Visit (HOSPITAL_COMMUNITY)
Admission: RE | Admit: 2011-01-10 | Discharge: 2011-01-10 | Disposition: A | Discharge status: Home / Self Care | Payer: Medicare HMO | Source: Ambulatory Visit | Attending: Internal Medicine | Admitting: Internal Medicine

## 2011-01-10 DIAGNOSIS — K219 Gastro-esophageal reflux disease without esophagitis: Secondary | ICD-10-CM | POA: Insufficient documentation

## 2011-01-10 DIAGNOSIS — R1310 Dysphagia, unspecified: Secondary | ICD-10-CM | POA: Insufficient documentation

## 2011-01-10 DIAGNOSIS — T17308A Unspecified foreign body in larynx causing other injury, initial encounter: Secondary | ICD-10-CM

## 2011-01-21 ENCOUNTER — Ambulatory Visit (HOSPITAL_COMMUNITY)
Admission: RE | Admit: 2011-01-21 | Discharge: 2011-01-21 | Disposition: A | Discharge status: Home / Self Care | Payer: Medicare HMO | Source: Ambulatory Visit | Attending: Internal Medicine | Admitting: Internal Medicine

## 2011-01-21 DIAGNOSIS — IMO0001 Reserved for inherently not codable concepts without codable children: Secondary | ICD-10-CM | POA: Insufficient documentation

## 2011-01-21 DIAGNOSIS — R1310 Dysphagia, unspecified: Secondary | ICD-10-CM | POA: Insufficient documentation

## 2012-03-22 ENCOUNTER — Other Ambulatory Visit: Payer: Self-pay | Admitting: Orthopedic Surgery

## 2012-03-22 ENCOUNTER — Other Ambulatory Visit (HOSPITAL_COMMUNITY): Payer: Self-pay | Admitting: Pulmonary Disease

## 2012-03-22 DIAGNOSIS — J449 Chronic obstructive pulmonary disease, unspecified: Secondary | ICD-10-CM

## 2012-03-22 MED ORDER — BUPIVACAINE 0.25 % ON-Q PUMP SINGLE CATH 300ML: 300.0000 mL | INJECTION | Status: DC

## 2012-03-22 MED ORDER — DEXAMETHASONE SODIUM PHOSPHATE 10 MG/ML IJ SOLN: 10.0000 mg | Freq: Once | INTRAMUSCULAR | Status: DC

## 2012-03-22 NOTE — Progress Notes (Signed)
Preoperative surgical orders have been place into the Epic hospital system for [name] on 03/22/2012, 9:32 AM  by Patrica Duel for surgery on 05/05/2012.  Preop Total Knee orders including Bupivacaine On-Q pump, IV Tylenol, and IV Decadron as long as there are no contraindications to the above medications. Avel Peace, PA-C

## 2012-03-26 ENCOUNTER — Institutional Professional Consult (permissible substitution): Payer: Medicare HMO | Admitting: Emergency Medicine

## 2012-03-30 ENCOUNTER — Ambulatory Visit (INDEPENDENT_AMBULATORY_CARE_PROVIDER_SITE_OTHER): Payer: Medicare Other | Admitting: Cardiology

## 2012-03-30 ENCOUNTER — Encounter: Payer: Self-pay | Admitting: Cardiology

## 2012-03-30 VITALS — BP 109/70 | HR 87 | Ht 70.0 in | Wt 226.0 lb

## 2012-03-30 DIAGNOSIS — Z01810 Encounter for preprocedural cardiovascular examination: Secondary | ICD-10-CM

## 2012-03-30 DIAGNOSIS — Z136 Encounter for screening for cardiovascular disorders: Secondary | ICD-10-CM

## 2012-03-30 DIAGNOSIS — F17210 Nicotine dependence, cigarettes, uncomplicated: Secondary | ICD-10-CM | POA: Insufficient documentation

## 2012-03-30 DIAGNOSIS — E782 Mixed hyperlipidemia: Secondary | ICD-10-CM | POA: Insufficient documentation

## 2012-03-30 DIAGNOSIS — F17200 Nicotine dependence, unspecified, uncomplicated: Secondary | ICD-10-CM

## 2012-03-30 DIAGNOSIS — Z720 Tobacco use: Secondary | ICD-10-CM

## 2012-03-30 NOTE — Assessment & Plan Note (Signed)
Smoking cessation recommended. She states that she has been able to quit in the past.

## 2012-03-30 NOTE — Assessment & Plan Note (Signed)
Patient reports no exertional chest pain or unusual breathlessness, able to achieve greater than 4 METS even with limited ambulation secondary to right knee pain. Her ECG today is nonspecific, QT interval is normal. LVEF was normal echocardiogram from last year. At this point would expect that she should be able to proceed with elective right knee replacement under general anesthesia with an acceptable preoperative cardiovascular risk. We can certainly see her in consultation in the hospital if the need arises.

## 2012-03-30 NOTE — Progress Notes (Signed)
Clinical Summary [name] is a 56 y.o.female referred for cardiology consultation by Dr. Modesto Charon. She is undergoing preoperative evaluation prior to right total knee replacement with Dr. Lequita Halt in late September. She has already been seen Dr. Juanetta Gosling from a pulmonary perspective with PFTs pending this week.  There is some mention of possible prolonged QT interval, however on review of the previous tracings, this has never been definitively the case. There was mention of a borderline prolonged QT interval by tracing from last May. Today's tracing shows normal QT interval with nonspecific ST-T changes.  I see that she had a previous admission to the hospital with chest pain last year, although states that this has not been a recurrent problem. Although limited by her right knee, she does describe activity in excess of 4 METS, no exertional chest pain or breathlessness.  Echocardiogram from May of 2012 demonstrated an LVEF of 60-65% without regional wall motion abnormalities and normal diastolic parameters.  She reports no palpitations, dizziness, sudden syncope.   Allergies  Allergen Reactions  . Penicillins Other (See Comments)    Mouth gets blisters    Current Outpatient Prescriptions  Medication Sig Dispense Refill  . albuterol (VENTOLIN HFA) 108 (90 BASE) MCG/ACT inhaler Inhale 2 puffs into the lungs every 6 (six) hours as needed.      . furosemide (LASIX) 20 MG tablet Take 1 tablet by mouth Once daily as needed.      . gabapentin (NEURONTIN) 600 MG tablet Take 1 tablet by mouth Three times a day.      . metFORMIN (GLUCOPHAGE) 500 MG tablet Take 1 tablet by mouth Daily.      Marland Kitchen omeprazole (PRILOSEC) 20 MG capsule Take 1 capsule by mouth Daily.        Past Medical History  Diagnosis Date  . Multiple sclerosis   . Type 2 diabetes mellitus   . GERD (gastroesophageal reflux disease)   . COPD (chronic obstructive pulmonary disease)   . Mixed hyperlipidemia     Past Surgical  History  Procedure Date  . Right total knee replacement   . Left breast lumpectomy   . Left carpal tunnel release   . Abdominal hysterectomy   . Sinus cyst removal   . Cholecystectomy     Family History  Problem Relation Age of Onset  . Dementia Mother   . Atrial fibrillation Father     Died with pulmonary embolus    Social History [name] reports that she has been smoking Cigarettes.  She has a 32 pack-year smoking history. She has never used smokeless tobacco. [name] reports that she does not drink alcohol.  Review of Systems Chronic knee pain, right worse and left. Reports history of peripheral neuropathy. Stable appetite. No reported bleeding problems. No recent cough or wheezing. Otherwise negative.  Physical Examination Filed Vitals:   03/30/12 1256  BP: 109/70  Pulse: 87   Overweight woman in no acute distress. HEENT: Conjunctiva and lids normal, oropharynx clear with moist mucosa. Neck: Supple, no elevated JVP or carotid bruits, no thyromegaly. Lungs: Clear to auscultation with decreased breath sounds, nonlabored breathing at rest. Cardiac: Regular rate and rhythm, no S3 or significant systolic murmur, no pericardial rub. Abdomen: Soft, nontender, bowel sounds present, no guarding or rebound. Extremities: Right knee brace in place. Also left wrist brace. No pitting edema, distal pulses 2+. Skin: Warm and dry. Musculoskeletal: No kyphosis. Neuropsychiatric: Alert and oriented x3, affect grossly appropriate.    Problem List and Plan  Preoperative cardiovascular examination Patient reports no exertional chest pain or unusual breathlessness, able to achieve greater than 4 METS even with limited ambulation secondary to right knee pain. Her ECG today is nonspecific, QT interval is normal. LVEF was normal echocardiogram from last year. At this point would expect that she should be able to proceed with elective right knee replacement under general anesthesia with  an acceptable preoperative cardiovascular risk. We can certainly see her in consultation in the hospital if the need arises.  Mixed hyperlipidemia Patient states that she was told she needed to start on statin therapy, however still considering the matter.  Tobacco abuse Smoking cessation recommended. She states that she has been able to quit in the past.    Jonelle Sidle, M.D., F.A.C.C.

## 2012-03-30 NOTE — Patient Instructions (Addendum)
Your physician recommends that you continue on your current medications as directed. Please refer to the Current Medication list given to you today.  Follow up as needed. We will contact Dr. Modesto Charon and Despina Hick.

## 2012-03-30 NOTE — Assessment & Plan Note (Signed)
Patient states that she was told she needed to start on statin therapy, however still considering the matter.

## 2012-04-01 ENCOUNTER — Ambulatory Visit (HOSPITAL_COMMUNITY)
Admission: RE | Admit: 2012-04-01 | Discharge: 2012-04-01 | Disposition: A | Discharge status: Home / Self Care | Payer: Medicare Other | Source: Ambulatory Visit | Attending: Pulmonary Disease | Admitting: Pulmonary Disease

## 2012-04-01 DIAGNOSIS — R0609 Other forms of dyspnea: Secondary | ICD-10-CM | POA: Insufficient documentation

## 2012-04-01 DIAGNOSIS — J4489 Other specified chronic obstructive pulmonary disease: Secondary | ICD-10-CM | POA: Insufficient documentation

## 2012-04-01 DIAGNOSIS — R0989 Other specified symptoms and signs involving the circulatory and respiratory systems: Secondary | ICD-10-CM | POA: Insufficient documentation

## 2012-04-01 DIAGNOSIS — J449 Chronic obstructive pulmonary disease, unspecified: Secondary | ICD-10-CM | POA: Insufficient documentation

## 2012-04-01 MED ORDER — ALBUTEROL SULFATE (5 MG/ML) 0.5% IN NEBU
2.5000 mg | INHALATION_SOLUTION | Freq: Once | RESPIRATORY_TRACT | Status: AC
  Administered 2012-04-01: 2.5 mg via RESPIRATORY_TRACT

## 2012-04-01 NOTE — Procedures (Signed)
[name], [name]               ACCOUNT NO.:  [id number]  MEDICAL RECORD NO.:  [id number]  LOCATION:  RESP                          FACILITY:  [facility]  PHYSICIAN:  [name], M.D.DATE OF BIRTH:  [birth year]  DATE OF PROCEDURE: DATE OF DISCHARGE:                           PULMONARY FUNCTION TEST   Reason for PFT is COPD.  1. Spirometry shows a moderate ventilatory defect with evidence of     airflow obstruction. 2. Lung volumes show air trapping. 3. DLCO is mildly decreased with some improvement when ventilation is     taken into account. 4. Airway resistance is elevated confirming the presence of airflow     obstruction. 5. There is improvement with inhaled bronchodilator, but it does not     reach the level of significance.     [name] Whisenant L. [name], M.D.     ELH/MEDQ  D:  04/01/2012  T:  04/01/2012  Job:  [id number]

## 2012-04-28 ENCOUNTER — Encounter (HOSPITAL_COMMUNITY): Payer: Self-pay | Admitting: Pharmacy Technician

## 2012-05-02 ENCOUNTER — Other Ambulatory Visit: Payer: Self-pay | Admitting: Orthopedic Surgery

## 2012-05-02 NOTE — H&P (Signed)
[name]  DOB: [birth year] Married / Language: English / Race: White Female  Date of Admission:  05/05/2012  Chief Complaint:  Right Knee Pain  History of Present Illness The patient is a 56 year old female who comes in today for a preoperative History and Physical. The patient is scheduled for a right total knee revision to be performed by Dr. Gus Rankin. Aluisio, MD at [facility] on 05/05/2012. The patient is a 56 year old female who presents for a recheck of Follow-up Knee. The patient is being followed for their right total knee pain. Symptoms reported today include: pain and instability. The patient feels that they are doing well and report their pain level to be mild to moderate. Current treatment includes: bracing and NSAIDs (ibuprofen or Aleve). The patient has reported improvement of their symptoms with: bracing. She said the right knee continues to bother her. When she braces it, it does make a difference. She said she feels more stable and with the brace also does not have as much discomfort. Ii is recommended that she go ahead and revise the knee since she does not feel like she can wear the brace all the time. They have been treated conservatively in the past for the above stated problem and despite conservative measures, they continue to have progressive pain and severe functional limitations and dysfunction. They have failed non-operative management. It is felt that they would benefit from undergoing total joint replacement. Risks and benefits of the procedure have been discussed with the patient and they elect to proceed with surgery. There are no active contraindications to surgery such as ongoing infection or rapidly progressive neurological disease.   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 Mouth Ulcers   Family History Cancer. grandmother mothers  side Osteoarthritis. mother and sister Chronic Obstructive Lung Disease. father Cerebrovascular Accident. grandfather mothers side Congestive Heart Failure. father Drug / Alcohol Addiction. brother and grandfather fathers side Depression. mother and grandfather fathers side Father. Deceased. age 43: a-fib, pulmonary embolism Mother. Living, Diabetes mellitus, Type I, Dementia. age 60   Social History Marital status. married Living situation. live with spouse Tobacco use. current every day smoker; smoke(d) 1/2 pack(s) per day Illicit drug use. no Pain Contract. no Drug/Alcohol Rehab (Currently). no Drug/Alcohol Rehab (Previously). no Current work status. disabled Number of flights of stairs before winded. 1 Children. 2 Exercise. Exercises weekly; does other Alcohol use. never consumed alcohol Post-Surgical Plans. Plan is to go home. Advance Directives. Living Will, Healthcare POA   Medication History Furosemide ( Oral) Specific dose unknown - Active. Omeprazole ( Oral) Specific dose unknown - Active. Gabapentin (600MG  Tablet, Oral) Active. MetFORMIN HCl (500MG  Tablet, Oral daily) Active. Pravastatin Sodium ( Oral) Specific dose unknown - Active. [employer] (400MCG/ACT Aero Pow Br Act, Inhalation) Active.   Pregnancy / Birth History Pregnant. no   Past Surgical History Hysterectomy. Date: 63. partial (cancerous) Gallbladder Surgery. Date: 19. laporoscopic Other Orthopaedic Surgery Total Knee Replacement. Date: 2010. right Sinus Surgery. Date: 43. Cyst Removal Arthroscopy of Shoulder. Date: 2009. left Arthroscopy of Knee. Date: 2003. right Breast Biopsy. left Dilation and Curettage of Uterus - Multiple Carpal Tunnel Repair. Date: 41. left DeQuervain's Procedure. Date: 30.   Medical History Peripheral Neuropathy Other disease, cancer, significant illness Skin Cancer Sleep Apnea Chronic Obstructive Lung  Disease Gastroesophageal Reflux Disease Osteoarthritis Migraine Headache Asthma Bronchitis COPD Multiple Sclerosis Menopause   Review of Systems General:Present- Night Sweats.  Not Present- Chills, Fever, Fatigue, Weight Gain, Weight Loss and Memory Loss. Skin:Not Present- Hives, Itching, Rash, Eczema and Lesions. HEENT:Not Present- Tinnitus, Headache, Double Vision, Visual Loss, Hearing Loss and Dentures. Respiratory:Present- Shortness of breath with exertion. Not Present- Shortness of breath at rest, Allergies, Coughing up blood and Chronic Cough. Cardiovascular:Not Present- Chest Pain, Racing/skipping heartbeats, Difficulty Breathing Lying Down, Murmur, Swelling and Palpitations. Gastrointestinal:Not Present- Bloody Stool, Heartburn, Abdominal Pain, Vomiting, Nausea, Constipation, Diarrhea, Difficulty Swallowing, Jaundice and Loss of appetitie. Female Genitourinary:Not Present- Blood in Urine, Urinary frequency, Weak urinary stream, Discharge, Flank Pain, Incontinence, Painful Urination, Urgency, Urinary Retention and Urinating at Night. Musculoskeletal:Present- Muscle Weakness, Muscle Pain, Joint Pain and Morning Stiffness. Not Present- Joint Swelling, Back Pain and Spasms. Neurological:Present- Weakness. Not Present- Tremor, Dizziness, Blackout spells, Paralysis and Difficulty with balance. Psychiatric:Not Present- Insomnia.   Vitals Weight: 225 lb Height: 70 in Body Surface Area: 2.25 m Body Mass Index: 32.28 kg/m Pulse: 78 (Regular) Resp.: 14 (Unlabored) BP: 124/78 (Sitting, Left Arm, Standard)  Physical Exam The physical exam findings are as follows:  Note: Patient is a 56 year old female with continued instability of the right knee.   General Mental Status - Alert, cooperative and good historian. General Appearance- pleasant. Not in acute distress. Orientation- Oriented X3. Build & Nutrition- Well nourished and Well developed.   Head  and Neck Head- normocephalic, atraumatic . Neck Global Assessment- supple. no bruit auscultated on the right and no bruit auscultated on the left.   Eye Pupil- Bilateral- Regular and Round. Note: wears glasses Motion- Bilateral- EOMI.   Chest and Lung Exam Auscultation: Breath sounds:- clear at anterior chest wall and - clear at posterior chest wall. Adventitious sounds:- No Adventitious sounds.   Cardiovascular Auscultation:Rhythm- Regular rate and rhythm. Heart Sounds- S1 WNL and S2 WNL. Murmurs & Other Heart Sounds:Auscultation of the heart reveals - No Murmurs.   Abdomen Palpation/Percussion:Tenderness- Abdomen is non-tender to palpation. Rigidity (guarding)- Abdomen is soft. Auscultation:Auscultation of the abdomen reveals - Bowel sounds normal.   Female Genitourinary  Not done, not pertinent to present illness  Musculoskeletal  On exam she is alert and oriented in no apparent distress. Her right knee shows no swelling. Range is hyperextension to 5 degrees down to flexion of 120. There is fairly significant laxity in full extension to varus and valgus stressing and significant laxity at 90 degrees of flexion to AP stressing.  Assessment & Plan [facility] CMPL INTRN ORTH DEV/IMPLANT/GRAFT NEC (996.49) Impression: Right Knee  Note: Patient is for a right total knee revision by Dr. Lequita Halt.  Plan is to go home.  Patient had problems with postoperative nausea and orthostatic hypotension for the first day and a half.  PCP - Dr. Modesto Charon Cards - Dr. Gracy Bruins - Dr. Shaune Pollack - Patient has been seen preoperatively and felt to stable for surgery.  Signed electronically by Roberts Gaudy, PA-C

## 2012-05-03 ENCOUNTER — Encounter (HOSPITAL_COMMUNITY): Payer: Self-pay

## 2012-05-03 ENCOUNTER — Encounter (HOSPITAL_COMMUNITY)
Admission: RE | Admit: 2012-05-03 | Discharge: 2012-05-03 | Disposition: A | Discharge status: Home / Self Care | Payer: Medicare Other | Source: Ambulatory Visit | Attending: Orthopedic Surgery | Admitting: Orthopedic Surgery

## 2012-05-03 HISTORY — DX: Psoriasis, unspecified: L40.9

## 2012-05-03 HISTORY — DX: Other specified postprocedural states: R11.2

## 2012-05-03 HISTORY — DX: Other specified postprocedural states: Z98.890

## 2012-05-03 HISTORY — DX: Weakness: R53.1

## 2012-05-03 HISTORY — DX: Malignant (primary) neoplasm, unspecified: C80.1

## 2012-05-03 HISTORY — DX: Nausea with vomiting, unspecified: R11.2

## 2012-05-03 HISTORY — DX: Unspecified osteoarthritis, unspecified site: M19.90

## 2012-05-03 HISTORY — DX: Polyneuropathy, unspecified: G62.9

## 2012-05-03 LAB — URINALYSIS, ROUTINE W REFLEX MICROSCOPIC
Bilirubin Urine: NEGATIVE
Ketones, ur: NEGATIVE mg/dL
Nitrite: NEGATIVE
Protein, ur: NEGATIVE mg/dL
Urobilinogen, UA: 0.2 mg/dL (ref 0.0–1.0)
pH: 6 (ref 5.0–8.0)

## 2012-05-03 LAB — CBC
MCH: 29.9 pg (ref 26.0–34.0)
MCV: 90.8 fL (ref 78.0–100.0)
Platelets: 241 10*3/uL (ref 150–400)
RBC: 4.79 MIL/uL (ref 3.87–5.11)
RDW: 12 % (ref 11.5–15.5)
WBC: 9.3 10*3/uL (ref 4.0–10.5)

## 2012-05-03 LAB — COMPREHENSIVE METABOLIC PANEL
ALT: 9 U/L (ref 0–35)
Alkaline Phosphatase: 74 U/L (ref 39–117)
BUN: 10 mg/dL (ref 6–23)
CO2: 28 mEq/L (ref 19–32)
Calcium: 9.3 mg/dL (ref 8.4–10.5)
GFR calc Af Amer: >90 mL/min (ref >90)
GFR calc non Af Amer: >90 mL/min (ref >90)
Glucose, Bld: 91 mg/dL (ref 70–99)
Total Protein: 6.7 g/dL (ref 6.0–8.3)

## 2012-05-03 LAB — PROTIME-INR: Prothrombin Time: 13.1 seconds (ref 11.6–15.2)

## 2012-05-03 MED ORDER — CHLORHEXIDINE GLUCONATE 4 % EX LIQD
60.0000 mL | Freq: Once | CUTANEOUS | Status: DC
  Filled 2012-05-03: qty 60

## 2012-05-03 NOTE — Patient Instructions (Addendum)
20 [name]  05/03/2012   Your procedure is scheduled on: 05/05/12 AT 10:15 AM   Report to SHORT STAY CENTER  AT: 7:45 AM.  Call this number if you have problems the morning of surgery: [phone]   Remember:   Do not eat food or drink liquids AFTER MIDNIGHT   Take these medicines the morning of surgery with A SIP OF WATER:GABAPENTIN / OMEPRAZOLE   Do not wear jewelry, make-up   Do not wear lotions, powders, or perfumes.   Do not shave legs or underarms 12 hrs. before surgery (men may shave face)  Do not bring valuables to the hospital.  Contacts, dentures or bridgework may not be worn into surgery.  Leave suitcase in the car. After surgery it may be brought to your room.  For patients admitted to the hospital more than one night, checkout time is 11:00             AM the day of discharge.   Patients discharged the day of surgery will not be allowed to drive home. If going home same day of surgery, must have someone stay with you first  24 hrs at home and arrange for some one to drive you home from hospital.    Special Instructions:   Please read over the following fact sheets that you were given:               1. MRSA  INFORMATION                      2. [place] PREPARING FOR SURGERY SHEET                3. INCENTIVE SPIROMETER                                                 X_________________________________________________________________________     05/03/2012

## 2012-05-04 NOTE — Progress Notes (Signed)
UA faxed to Dr. Aluisio - confirmation recieved 

## 2012-05-05 ENCOUNTER — Encounter (HOSPITAL_COMMUNITY): Payer: Self-pay | Admitting: *Deleted

## 2012-05-05 ENCOUNTER — Encounter (HOSPITAL_COMMUNITY): Payer: Self-pay | Admitting: Anesthesiology

## 2012-05-05 ENCOUNTER — Inpatient Hospital Stay (HOSPITAL_COMMUNITY): Payer: Medicare Other | Admitting: Anesthesiology

## 2012-05-05 ENCOUNTER — Encounter (HOSPITAL_COMMUNITY)
Admission: RE | Disposition: A | Discharge status: Home Health | Payer: Self-pay | Source: Ambulatory Visit | Attending: Orthopedic Surgery

## 2012-05-05 ENCOUNTER — Inpatient Hospital Stay (HOSPITAL_COMMUNITY)
Admission: RE | Admit: 2012-05-05 | Discharge: 2012-05-07 | DRG: 467 | Disposition: A | Discharge status: Home Health | Payer: Medicare Other | Source: Ambulatory Visit | Attending: Orthopedic Surgery | Admitting: Orthopedic Surgery

## 2012-05-05 DIAGNOSIS — F17200 Nicotine dependence, unspecified, uncomplicated: Secondary | ICD-10-CM | POA: Diagnosis present

## 2012-05-05 DIAGNOSIS — T84018A Broken internal joint prosthesis, other site, initial encounter: Secondary | ICD-10-CM

## 2012-05-05 DIAGNOSIS — E871 Hypo-osmolality and hyponatremia: Secondary | ICD-10-CM | POA: Diagnosis not present

## 2012-05-05 DIAGNOSIS — T84099A Other mechanical complication of unspecified internal joint prosthesis, initial encounter: Principal | ICD-10-CM | POA: Diagnosis present

## 2012-05-05 DIAGNOSIS — E1149 Type 2 diabetes mellitus with other diabetic neurological complication: Secondary | ICD-10-CM | POA: Diagnosis present

## 2012-05-05 DIAGNOSIS — E782 Mixed hyperlipidemia: Secondary | ICD-10-CM | POA: Diagnosis present

## 2012-05-05 DIAGNOSIS — Z880 Allergy status to penicillin: Secondary | ICD-10-CM

## 2012-05-05 DIAGNOSIS — Z96659 Presence of unspecified artificial knee joint: Secondary | ICD-10-CM

## 2012-05-05 DIAGNOSIS — E785 Hyperlipidemia, unspecified: Secondary | ICD-10-CM | POA: Diagnosis present

## 2012-05-05 DIAGNOSIS — K219 Gastro-esophageal reflux disease without esophagitis: Secondary | ICD-10-CM | POA: Diagnosis present

## 2012-05-05 DIAGNOSIS — L408 Other psoriasis: Secondary | ICD-10-CM | POA: Diagnosis present

## 2012-05-05 DIAGNOSIS — E1142 Type 2 diabetes mellitus with diabetic polyneuropathy: Secondary | ICD-10-CM | POA: Diagnosis present

## 2012-05-05 DIAGNOSIS — J4489 Other specified chronic obstructive pulmonary disease: Secondary | ICD-10-CM | POA: Diagnosis present

## 2012-05-05 DIAGNOSIS — J449 Chronic obstructive pulmonary disease, unspecified: Secondary | ICD-10-CM | POA: Diagnosis present

## 2012-05-05 DIAGNOSIS — Z79899 Other long term (current) drug therapy: Secondary | ICD-10-CM

## 2012-05-05 DIAGNOSIS — G35 Multiple sclerosis: Secondary | ICD-10-CM | POA: Diagnosis present

## 2012-05-05 DIAGNOSIS — Y831 Surgical operation with implant of artificial internal device as the cause of abnormal reaction of the patient, or of later complication, without mention of misadventure at the time of the procedure: Secondary | ICD-10-CM | POA: Diagnosis present

## 2012-05-05 HISTORY — PX: TOTAL KNEE REVISION: SHX996

## 2012-05-05 LAB — GLUCOSE, CAPILLARY
Glucose-Capillary: 120 mg/dL — ABNORMAL HIGH (ref 70–99)
Glucose-Capillary: 130 mg/dL — ABNORMAL HIGH (ref 70–99)

## 2012-05-05 LAB — TYPE AND SCREEN
ABO/RH(D): O POS
Antibody Screen: NEGATIVE

## 2012-05-05 SURGERY — TOTAL KNEE REVISION
Anesthesia: General | Site: Knee | Laterality: Right | Wound class: Clean

## 2012-05-05 MED ORDER — SCOPOLAMINE 1 MG/3DAYS TD PT72
MEDICATED_PATCH | TRANSDERMAL | Status: DC | PRN
  Administered 2012-05-05: 1 via TRANSDERMAL

## 2012-05-05 MED ORDER — PROMETHAZINE HCL 25 MG/ML IJ SOLN: 6.2500 mg | INTRAMUSCULAR | Status: DC | PRN

## 2012-05-05 MED ORDER — TRAMADOL HCL 50 MG PO TABS
50.0000 mg | ORAL_TABLET | Freq: Four times a day (QID) | ORAL | Status: DC | PRN
  Administered 2012-05-05: 50 mg via ORAL
  Administered 2012-05-06: 100 mg via ORAL
  Filled 2012-05-05: qty 2
  Filled 2012-05-05: qty 1

## 2012-05-05 MED ORDER — FENTANYL CITRATE 0.05 MG/ML IJ SOLN
INTRAMUSCULAR | Status: DC | PRN
  Administered 2012-05-05: 50 ug via INTRAVENOUS
  Administered 2012-05-05: 100 ug via INTRAVENOUS
  Administered 2012-05-05 (×2): 50 ug via INTRAVENOUS

## 2012-05-05 MED ORDER — PHENOL 1.4 % MT LIQD
1.0000 | OROMUCOSAL | Status: DC | PRN
  Filled 2012-05-05: qty 177

## 2012-05-05 MED ORDER — ACETAMINOPHEN 10 MG/ML IV SOLN: 1000.0000 mg | Freq: Once | INTRAVENOUS | Status: DC

## 2012-05-05 MED ORDER — BUPIVACAINE 0.25 % ON-Q PUMP SINGLE CATH 300ML
INJECTION | Status: DC | PRN
  Administered 2012-05-05: 300 mL

## 2012-05-05 MED ORDER — ONDANSETRON HCL 4 MG/2ML IJ SOLN
INTRAMUSCULAR | Status: DC | PRN
  Administered 2012-05-05: 4 mg via INTRAVENOUS

## 2012-05-05 MED ORDER — SODIUM CHLORIDE 0.9 % IR SOLN
Status: DC | PRN
  Administered 2012-05-05: 3000 mL

## 2012-05-05 MED ORDER — 0.9 % SODIUM CHLORIDE (POUR BTL) OPTIME
TOPICAL | Status: DC | PRN
  Administered 2012-05-05: 1000 mL

## 2012-05-05 MED ORDER — ACLIDINIUM BROMIDE 400 MCG/ACT IN AEPB: 1.0000 | INHALATION_SPRAY | Freq: Two times a day (BID) | RESPIRATORY_TRACT | Status: DC

## 2012-05-05 MED ORDER — CEFAZOLIN SODIUM 1-5 GM-% IV SOLN
1.0000 g | Freq: Four times a day (QID) | INTRAVENOUS | Status: AC
  Administered 2012-05-05 (×2): 1 g via INTRAVENOUS
  Filled 2012-05-05 (×2): qty 50

## 2012-05-05 MED ORDER — NEOSTIGMINE METHYLSULFATE 1 MG/ML IJ SOLN
INTRAMUSCULAR | Status: DC | PRN
  Administered 2012-05-05: 3 mg via INTRAVENOUS

## 2012-05-05 MED ORDER — METOCLOPRAMIDE HCL 10 MG PO TABS: 5.0000 mg | ORAL_TABLET | Freq: Three times a day (TID) | ORAL | Status: DC | PRN

## 2012-05-05 MED ORDER — HYDROMORPHONE HCL PF 1 MG/ML IJ SOLN
INTRAMUSCULAR | Status: DC | PRN
  Administered 2012-05-05: 0.5 mg via INTRAVENOUS
  Administered 2012-05-05: 1 mg via INTRAVENOUS
  Administered 2012-05-05: 0.5 mg via INTRAVENOUS

## 2012-05-05 MED ORDER — ACETAMINOPHEN 650 MG RE SUPP: 650.0000 mg | Freq: Four times a day (QID) | RECTAL | Status: DC | PRN

## 2012-05-05 MED ORDER — HYDROCORTISONE SOD SUCCINATE 1000 MG IJ SOLR
INTRAMUSCULAR | Status: DC | PRN
  Administered 2012-05-05: 100 mg via INTRAVENOUS

## 2012-05-05 MED ORDER — MEPERIDINE HCL 50 MG/ML IJ SOLN: 6.2500 mg | INTRAMUSCULAR | Status: DC | PRN

## 2012-05-05 MED ORDER — BUPIVACAINE ON-Q PAIN PUMP (FOR ORDER SET NO CHG)
INJECTION | Status: DC
  Filled 2012-05-05: qty 1

## 2012-05-05 MED ORDER — GLYCOPYRROLATE 0.2 MG/ML IJ SOLN
INTRAMUSCULAR | Status: DC | PRN
  Administered 2012-05-05: .4 mg via INTRAVENOUS
  Administered 2012-05-05: 0.2 mg via INTRAVENOUS

## 2012-05-05 MED ORDER — DIPHENHYDRAMINE HCL 50 MG/ML IJ SOLN: 12.5000 mg | Freq: Four times a day (QID) | INTRAMUSCULAR | Status: DC | PRN

## 2012-05-05 MED ORDER — INSULIN ASPART 100 UNIT/ML ~~LOC~~ SOLN
0.0000 [IU] | Freq: Three times a day (TID) | SUBCUTANEOUS | Status: DC
  Administered 2012-05-05 – 2012-05-06 (×2): 2 [IU] via SUBCUTANEOUS
  Administered 2012-05-06: 3 [IU] via SUBCUTANEOUS

## 2012-05-05 MED ORDER — ONDANSETRON HCL 4 MG PO TABS: 4.0000 mg | ORAL_TABLET | Freq: Four times a day (QID) | ORAL | Status: DC | PRN

## 2012-05-05 MED ORDER — SODIUM CHLORIDE 0.9 % IV SOLN: INTRAVENOUS | Status: DC

## 2012-05-05 MED ORDER — LIDOCAINE HCL (CARDIAC) 20 MG/ML IV SOLN
INTRAVENOUS | Status: DC | PRN
  Administered 2012-05-05: 50 mg via INTRAVENOUS

## 2012-05-05 MED ORDER — LACTATED RINGERS IV SOLN: INTRAVENOUS | Status: DC

## 2012-05-05 MED ORDER — MORPHINE SULFATE (PF) 1 MG/ML IV SOLN
INTRAVENOUS | Status: AC
  Filled 2012-05-05: qty 25

## 2012-05-05 MED ORDER — FUROSEMIDE 20 MG PO TABS
20.0000 mg | ORAL_TABLET | Freq: Every day | ORAL | Status: DC | PRN
  Filled 2012-05-05: qty 1

## 2012-05-05 MED ORDER — ONDANSETRON HCL 4 MG/2ML IJ SOLN
4.0000 mg | Freq: Four times a day (QID) | INTRAMUSCULAR | Status: DC | PRN
  Administered 2012-05-06: 4 mg via INTRAVENOUS
  Filled 2012-05-05: qty 2

## 2012-05-05 MED ORDER — METHOCARBAMOL 500 MG PO TABS
500.0000 mg | ORAL_TABLET | Freq: Four times a day (QID) | ORAL | Status: DC | PRN
  Administered 2012-05-06 – 2012-05-07 (×2): 500 mg via ORAL
  Filled 2012-05-05 (×2): qty 1

## 2012-05-05 MED ORDER — RIVAROXABAN 10 MG PO TABS
10.0000 mg | ORAL_TABLET | Freq: Every day | ORAL | Status: DC
  Administered 2012-05-06 – 2012-05-07 (×2): 10 mg via ORAL
  Filled 2012-05-05 (×3): qty 1

## 2012-05-05 MED ORDER — METOCLOPRAMIDE HCL 5 MG/ML IJ SOLN: 5.0000 mg | Freq: Three times a day (TID) | INTRAMUSCULAR | Status: DC | PRN

## 2012-05-05 MED ORDER — ACETAMINOPHEN 10 MG/ML IV SOLN
1000.0000 mg | Freq: Four times a day (QID) | INTRAVENOUS | Status: AC
  Administered 2012-05-05 – 2012-05-06 (×4): 1000 mg via INTRAVENOUS
  Filled 2012-05-05 (×6): qty 100

## 2012-05-05 MED ORDER — HYDROMORPHONE HCL PF 1 MG/ML IJ SOLN
0.2500 mg | INTRAMUSCULAR | Status: DC | PRN
  Administered 2012-05-05 (×2): 0.25 mg via INTRAVENOUS

## 2012-05-05 MED ORDER — POLYETHYLENE GLYCOL 3350 17 G PO PACK: 17.0000 g | PACK | Freq: Every day | ORAL | Status: DC | PRN

## 2012-05-05 MED ORDER — ALBUTEROL SULFATE HFA 108 (90 BASE) MCG/ACT IN AERS
2.0000 | INHALATION_SPRAY | Freq: Four times a day (QID) | RESPIRATORY_TRACT | Status: DC | PRN
  Filled 2012-05-05: qty 6.7

## 2012-05-05 MED ORDER — MORPHINE SULFATE (PF) 1 MG/ML IV SOLN
INTRAVENOUS | Status: DC
  Administered 2012-05-05: 14:00:00 via INTRAVENOUS

## 2012-05-05 MED ORDER — ACETAMINOPHEN 325 MG PO TABS
650.0000 mg | ORAL_TABLET | Freq: Four times a day (QID) | ORAL | Status: DC | PRN
  Administered 2012-05-07 (×2): 650 mg via ORAL
  Filled 2012-05-05 (×2): qty 2

## 2012-05-05 MED ORDER — POLYVINYL ALCOHOL 1.4 % OP SOLN
1.0000 [drp] | Freq: Every evening | OPHTHALMIC | Status: DC | PRN
  Filled 2012-05-05: qty 15

## 2012-05-05 MED ORDER — MENTHOL 3 MG MT LOZG
1.0000 | LOZENGE | OROMUCOSAL | Status: DC | PRN
  Filled 2012-05-05: qty 9

## 2012-05-05 MED ORDER — NALOXONE HCL 0.4 MG/ML IJ SOLN: 0.4000 mg | INTRAMUSCULAR | Status: DC | PRN

## 2012-05-05 MED ORDER — DEXTROSE 5 % IV SOLN
3.0000 g | INTRAVENOUS | Status: AC
  Administered 2012-05-05: 2 g via INTRAVENOUS
  Filled 2012-05-05: qty 3000

## 2012-05-05 MED ORDER — GABAPENTIN 300 MG PO CAPS
600.0000 mg | ORAL_CAPSULE | Freq: Three times a day (TID) | ORAL | Status: DC
  Administered 2012-05-05 – 2012-05-07 (×5): 600 mg via ORAL
  Filled 2012-05-05 (×8): qty 2

## 2012-05-05 MED ORDER — METOCLOPRAMIDE HCL 5 MG/ML IJ SOLN
INTRAMUSCULAR | Status: DC | PRN
  Administered 2012-05-05: 10 mg via INTRAVENOUS

## 2012-05-05 MED ORDER — METFORMIN HCL 500 MG PO TABS
500.0000 mg | ORAL_TABLET | Freq: Every day | ORAL | Status: DC
  Filled 2012-05-05 (×2): qty 1

## 2012-05-05 MED ORDER — HYPROMELLOSE 0.3 % OP GEL: 1.0000 [drp] | Freq: Every evening | OPHTHALMIC | Status: DC | PRN

## 2012-05-05 MED ORDER — ROCURONIUM BROMIDE 100 MG/10ML IV SOLN
INTRAVENOUS | Status: DC | PRN
  Administered 2012-05-05: 50 mg via INTRAVENOUS

## 2012-05-05 MED ORDER — DIPHENHYDRAMINE HCL 12.5 MG/5ML PO ELIX: 12.5000 mg | ORAL_SOLUTION | Freq: Four times a day (QID) | ORAL | Status: DC | PRN

## 2012-05-05 MED ORDER — ACETAMINOPHEN 10 MG/ML IV SOLN
INTRAVENOUS | Status: DC | PRN
  Administered 2012-05-05: 1000 mg via INTRAVENOUS

## 2012-05-05 MED ORDER — LACTATED RINGERS IV SOLN
INTRAVENOUS | Status: DC | PRN
  Administered 2012-05-05 (×2): via INTRAVENOUS

## 2012-05-05 MED ORDER — MIDAZOLAM HCL 5 MG/5ML IJ SOLN
INTRAMUSCULAR | Status: DC | PRN
  Administered 2012-05-05: 2 mg via INTRAVENOUS

## 2012-05-05 MED ORDER — DOCUSATE SODIUM 100 MG PO CAPS
100.0000 mg | ORAL_CAPSULE | Freq: Two times a day (BID) | ORAL | Status: DC
  Administered 2012-05-05 – 2012-05-07 (×4): 100 mg via ORAL

## 2012-05-05 MED ORDER — FLEET ENEMA 7-19 GM/118ML RE ENEM: 1.0000 | ENEMA | Freq: Once | RECTAL | Status: AC | PRN

## 2012-05-05 MED ORDER — SODIUM CHLORIDE 0.9 % IJ SOLN: 9.0000 mL | INTRAMUSCULAR | Status: DC | PRN

## 2012-05-05 MED ORDER — DEXTROSE-NACL 5-0.9 % IV SOLN
INTRAVENOUS | Status: DC
  Administered 2012-05-05 – 2012-05-06 (×2): via INTRAVENOUS

## 2012-05-05 MED ORDER — PANTOPRAZOLE SODIUM 40 MG PO TBEC
40.0000 mg | DELAYED_RELEASE_TABLET | Freq: Every day | ORAL | Status: DC
  Filled 2012-05-05: qty 1

## 2012-05-05 MED ORDER — OXYCODONE HCL 5 MG PO TABS: 5.0000 mg | ORAL_TABLET | ORAL | Status: DC | PRN

## 2012-05-05 MED ORDER — DIPHENHYDRAMINE HCL 12.5 MG/5ML PO ELIX: 12.5000 mg | ORAL_SOLUTION | ORAL | Status: DC | PRN

## 2012-05-05 MED ORDER — METHOCARBAMOL 100 MG/ML IJ SOLN
500.0000 mg | Freq: Four times a day (QID) | INTRAVENOUS | Status: DC | PRN
  Administered 2012-05-05 – 2012-05-06 (×2): 500 mg via INTRAVENOUS
  Filled 2012-05-05 (×2): qty 5

## 2012-05-05 MED ORDER — PROPOFOL 10 MG/ML IV BOLUS
INTRAVENOUS | Status: DC | PRN
  Administered 2012-05-05: 200 mg via INTRAVENOUS

## 2012-05-05 MED ORDER — BISACODYL 10 MG RE SUPP: 10.0000 mg | Freq: Every day | RECTAL | Status: DC | PRN

## 2012-05-05 MED ORDER — ONDANSETRON HCL 4 MG/2ML IJ SOLN: 4.0000 mg | Freq: Four times a day (QID) | INTRAMUSCULAR | Status: DC | PRN

## 2012-05-05 SURGICAL SUPPLY — 75 items
ADAPTER BOLT FEMORAL +2/-2 (Knees) ×1 IMPLANT
ADPR FEM +2/-2 OFST BOLT (Knees) ×1 IMPLANT
ADPR FEM 5D STRL KN PFC SGM (Orthopedic Implant) ×1 IMPLANT
AUG FEM 4 4 CMB LF KN POST (Knees) ×2 IMPLANT
AUG TIB SZ4 5 REV STP WDG STRL (Knees) ×2 IMPLANT
AUGMENT POSTERIOR PFC SZ4 4MM (Knees) IMPLANT
BAG SPEC THK2 15X12 ZIP CLS (MISCELLANEOUS) ×1
BAG ZIPLOCK 12X15 (MISCELLANEOUS) ×2 IMPLANT
BANDAGE ELASTIC 6 VELCRO ST LF (GAUZE/BANDAGES/DRESSINGS) ×2 IMPLANT
BANDAGE ESMARK 6X9 LF (GAUZE/BANDAGES/DRESSINGS) ×1 IMPLANT
BLADE SAG 18X100X1.27 (BLADE) ×2 IMPLANT
BLADE SAW SGTL 11.0X1.19X90.0M (BLADE) ×2 IMPLANT
BNDG CMPR 9X6 STRL LF SNTH (GAUZE/BANDAGES/DRESSINGS) ×1
BNDG ESMARK 6X9 LF (GAUZE/BANDAGES/DRESSINGS) ×2
BONE CEMENT GENTAMICIN (Cement) ×6 IMPLANT
CATH KIT ON-Q SILVERSOAK 5 (CATHETERS) ×1 IMPLANT
CATH KIT ON-Q SILVERSOAK 5IN (CATHETERS) ×2 IMPLANT
CEMENT RESTRICTOR DEPUY SZ 4 (Cement) ×1 IMPLANT
CLOTH BEACON ORANGE TIMEOUT ST (SAFETY) ×2 IMPLANT
COMP FEM CEM RT SZ4 (Orthopedic Implant) ×2 IMPLANT
COMPONENT FEM CEM RT SZ4 (Orthopedic Implant) IMPLANT
CONT SPECI 4OZ STER CLIK (MISCELLANEOUS) ×2 IMPLANT
CUFF TOURN SGL QUICK 34 (TOURNIQUET CUFF) ×2
CUFF TRNQT CYL 34X4X40X1 (TOURNIQUET CUFF) ×1 IMPLANT
DRAPE EXTREMITY T 121X128X90 (DRAPE) ×2 IMPLANT
DRAPE POUCH INSTRU U-SHP 10X18 (DRAPES) ×2 IMPLANT
DRAPE U-SHAPE 47X51 STRL (DRAPES) ×2 IMPLANT
DRSG ADAPTIC 3X8 NADH LF (GAUZE/BANDAGES/DRESSINGS) ×2 IMPLANT
DRSG PAD ABDOMINAL 8X10 ST (GAUZE/BANDAGES/DRESSINGS) ×1 IMPLANT
DURAPREP 26ML APPLICATOR (WOUND CARE) ×2 IMPLANT
ELECT REM PT RETURN 9FT ADLT (ELECTROSURGICAL) ×2
ELECTRODE REM PT RTRN 9FT ADLT (ELECTROSURGICAL) ×1 IMPLANT
EVACUATOR 1/8 PVC DRAIN (DRAIN) ×2 IMPLANT
FACESHIELD LNG OPTICON STERILE (SAFETY) ×11 IMPLANT
FEMORAL ADAPTER (Orthopedic Implant) ×1 IMPLANT
GLOVE BIO SURGEON STRL SZ8 (GLOVE) ×2 IMPLANT
GLOVE BIOGEL PI IND STRL 8 (GLOVE) ×2 IMPLANT
GLOVE BIOGEL PI INDICATOR 8 (GLOVE) ×2
GLOVE ECLIPSE 8.0 STRL XLNG CF (GLOVE) ×2 IMPLANT
GLOVE SURG SS PI 6.5 STRL IVOR (GLOVE) ×7 IMPLANT
GOWN STRL NON-REIN LRG LVL3 (GOWN DISPOSABLE) ×7 IMPLANT
GOWN STRL REIN XL XLG (GOWN DISPOSABLE) ×2 IMPLANT
HANDPIECE INTERPULSE COAX TIP (DISPOSABLE) ×2
IMMOBILIZER KNEE 20 (SOFTGOODS) ×2
IMMOBILIZER KNEE 20 THIGH 36 (SOFTGOODS) ×1 IMPLANT
INSERT TIBIAL RP TCS SZ 4.0 (Knees) ×1 IMPLANT
KIT BASIN OR (CUSTOM PROCEDURE TRAY) ×2 IMPLANT
MANIFOLD NEPTUNE II (INSTRUMENTS) ×2 IMPLANT
NS IRRIG 1000ML POUR BTL (IV SOLUTION) ×3 IMPLANT
PACK TOTAL JOINT (CUSTOM PROCEDURE TRAY) ×2 IMPLANT
PAD ABD 7.5X8 STRL (GAUZE/BANDAGES/DRESSINGS) ×2 IMPLANT
PADDING CAST COTTON 6X4 STRL (CAST SUPPLIES) ×4 IMPLANT
PATELLA DOME PFC 41MM (Knees) ×1 IMPLANT
POSITIONER SURGICAL ARM (MISCELLANEOUS) ×2 IMPLANT
POSTERIOR AUGMENT PFC SZ4 4MM (Knees) ×4 IMPLANT
SET HNDPC FAN SPRY TIP SCT (DISPOSABLE) ×1 IMPLANT
SLEEVE SURGEON STRL (DRAPES) ×1 IMPLANT
SPONGE GAUZE 4X4 12PLY (GAUZE/BANDAGES/DRESSINGS) ×2 IMPLANT
STAPLER VISISTAT 35W (STAPLE) ×2 IMPLANT
STEM TIBIA PFC 13X30MM (Stem) ×1 IMPLANT
STEM UNIVERSAL REVISION 75X18 (Stem) ×1 IMPLANT
SUCTION FRAZIER 12FR DISP (SUCTIONS) ×2 IMPLANT
SUT VIC AB 2-0 CT1 27 (SUTURE) ×6
SUT VIC AB 2-0 CT1 TAPERPNT 27 (SUTURE) ×3 IMPLANT
SUT VLOC 180 0 24IN GS25 (SUTURE) ×1 IMPLANT
SWAB COLLECTION DEVICE MRSA (MISCELLANEOUS) ×2 IMPLANT
TOWEL OR 17X26 10 PK STRL BLUE (TOWEL DISPOSABLE) ×4 IMPLANT
TOWER CARTRIDGE SMART MIX (DISPOSABLE) ×2 IMPLANT
TRAY FOLEY CATH 14FRSI W/METER (CATHETERS) ×2 IMPLANT
TRAY REVISION SZ 4 (Knees) ×1 IMPLANT
TRAY SLEEVE CEM ML (Knees) ×1 IMPLANT
TUBE ANAEROBIC SPECIMEN COL (MISCELLANEOUS) ×2 IMPLANT
WATER STERILE IRR 1500ML POUR (IV SOLUTION) ×2 IMPLANT
WEDGE SIZE 4 5MM (Knees) ×2 IMPLANT
WRAP KNEE MAXI GEL POST OP (GAUZE/BANDAGES/DRESSINGS) ×4 IMPLANT

## 2012-05-05 NOTE — Anesthesia Postprocedure Evaluation (Signed)
  Anesthesia Post-op Note  Patient: [name]  Procedure(s) Performed: Procedure(s) (LRB): TOTAL KNEE REVISION (Right)  Patient Location: PACU  Anesthesia Type: General  Level of Consciousness: awake and alert   Airway and Oxygen Therapy: Patient Spontanous Breathing  Post-op Pain: mild  Post-op Assessment: Post-op Vital signs reviewed, Patient's Cardiovascular Status Stable, Respiratory Function Stable, Patent Airway and No signs of Nausea or vomiting  Post-op Vital Signs: stable  Complications: No apparent anesthesia complications

## 2012-05-05 NOTE — Brief Op Note (Signed)
05/05/2012  1:01 PM  PATIENT:  [name]  56 y.o. female  PRE-OPERATIVE DIAGNOSIS:  Right Total Knee Arthroplasty Unstable  POST-OPERATIVE DIAGNOSIS:  Right Total Knee Arthroplasty Unstable  PROCEDURE:  Procedure(s) (LRB) with comments: TOTAL KNEE REVISION (Right)  SURGEON:  Surgeon(s) and Role:    * Loanne Drilling, MD - Primary  PHYSICIAN ASSISTANT:   ASSISTANTS: Dimitri Ped, PA-C   ANESTHESIA:   general  EBL:  Total I/O In: 1000 [I.V.:1000] Out: 325 [Urine:225; Blood:100]  BLOOD ADMINISTERED:none  DRAINS: (Medium) Hemovact drain(s) in the right knee with  Suction Open   LOCAL MEDICATIONS USED:  OTHER On- Q marcaine pain pump  COUNTS:  YES  TOURNIQUET:   Total Tourniquet Time Documented: Thigh (Right) - 75 minutes  DICTATION: .Other Dictation: Dictation Number [id number]  PLAN OF CARE: Admit to inpatient   PATIENT DISPOSITION:  PACU - hemodynamically stable.

## 2012-05-05 NOTE — Progress Notes (Signed)
Utilization review completed.  

## 2012-05-05 NOTE — H&P (View-Only) (Signed)
[name]  DOB: 04/12/1956 Married / Language: English / Race: White Female  Date of Admission:  05/05/2012  Chief Complaint:  Right Knee Pain  History of Present Illness The patient is a 56 year old female who comes in today for a preoperative History and Physical. The patient is scheduled for a right total knee revision to be performed by Dr. Frank V. Aluisio, MD at [facility] on 05/05/2012. The patient is a 56 year old female who presents for a recheck of Follow-up Knee. The patient is being followed for their right total knee pain. Symptoms reported today include: pain and instability. The patient feels that they are doing well and report their pain level to be mild to moderate. Current treatment includes: bracing and NSAIDs (ibuprofen or Aleve). The patient has reported improvement of their symptoms with: bracing. She said the right knee continues to bother her. When she braces it, it does make a difference. She said she feels more stable and with the brace also does not have as much discomfort. Ii is recommended that she go ahead and revise the knee since she does not feel like she can wear the brace all the time. They have been treated conservatively in the past for the above stated problem and despite conservative measures, they continue to have progressive pain and severe functional limitations and dysfunction. They have failed non-operative management. It is felt that they would benefit from undergoing total joint replacement. Risks and benefits of the procedure have been discussed with the patient and they elect to proceed with surgery. There are no active contraindications to surgery such as ongoing infection or rapidly progressive neurological disease.   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 Mouth Ulcers   Family History Cancer. grandmother mothers  side Osteoarthritis. mother and sister Chronic Obstructive Lung Disease. father Cerebrovascular Accident. grandfather mothers side Congestive Heart Failure. father Drug / Alcohol Addiction. brother and grandfather fathers side Depression. mother and grandfather fathers side Father. Deceased. age 80: a-fib, pulmonary embolism Mother. Living, Diabetes mellitus, Type I, Dementia. age 84   Social History Marital status. married Living situation. live with spouse Tobacco use. current every day smoker; smoke(d) 1/2 pack(s) per day Illicit drug use. no Pain Contract. no Drug/Alcohol Rehab (Currently). no Drug/Alcohol Rehab (Previously). no Current work status. disabled Number of flights of stairs before winded. 1 Children. 2 Exercise. Exercises weekly; does other Alcohol use. never consumed alcohol Post-Surgical Plans. Plan is to go home. Advance Directives. Living Will, Healthcare POA   Medication History Furosemide ( Oral) Specific dose unknown - Active. Omeprazole ( Oral) Specific dose unknown - Active. Gabapentin (600MG Tablet, Oral) Active. MetFORMIN HCl (500MG Tablet, Oral daily) Active. Pravastatin Sodium ( Oral) Specific dose unknown - Active. Tudorza Pressair (400MCG/ACT Aero Pow Br Act, Inhalation) Active.   Pregnancy / Birth History Pregnant. no   Past Surgical History Hysterectomy. Date: 1984. partial (cancerous) Gallbladder Surgery. Date: 1998. laporoscopic Other Orthopaedic Surgery Total Knee Replacement. Date: 2010. right Sinus Surgery. Date: 1976. Cyst Removal Arthroscopy of Shoulder. Date: 2009. left Arthroscopy of Knee. Date: 2003. right Breast Biopsy. left Dilation and Curettage of Uterus - Multiple Carpal Tunnel Repair. Date: 1993. left DeQuervain's Procedure. Date: 1996.   Medical History Peripheral Neuropathy Other disease, cancer, significant illness Skin Cancer Sleep Apnea Chronic Obstructive Lung  Disease Gastroesophageal Reflux Disease Osteoarthritis Migraine Headache Asthma Bronchitis COPD Multiple Sclerosis Menopause   Review of Systems General:Present- Night Sweats.   Not Present- Chills, Fever, Fatigue, Weight Gain, Weight Loss and Memory Loss. Skin:Not Present- Hives, Itching, Rash, Eczema and Lesions. HEENT:Not Present- Tinnitus, Headache, Double Vision, Visual Loss, Hearing Loss and Dentures. Respiratory:Present- Shortness of breath with exertion. Not Present- Shortness of breath at rest, Allergies, Coughing up blood and Chronic Cough. Cardiovascular:Not Present- Chest Pain, Racing/skipping heartbeats, Difficulty Breathing Lying Down, Murmur, Swelling and Palpitations. Gastrointestinal:Not Present- Bloody Stool, Heartburn, Abdominal Pain, Vomiting, Nausea, Constipation, Diarrhea, Difficulty Swallowing, Jaundice and Loss of appetitie. Female Genitourinary:Not Present- Blood in Urine, Urinary frequency, Weak urinary stream, Discharge, Flank Pain, Incontinence, Painful Urination, Urgency, Urinary Retention and Urinating at Night. Musculoskeletal:Present- Muscle Weakness, Muscle Pain, Joint Pain and Morning Stiffness. Not Present- Joint Swelling, Back Pain and Spasms. Neurological:Present- Weakness. Not Present- Tremor, Dizziness, Blackout spells, Paralysis and Difficulty with balance. Psychiatric:Not Present- Insomnia.   Vitals Weight: 225 lb Height: 70 in Body Surface Area: 2.25 m Body Mass Index: 32.28 kg/m Pulse: 78 (Regular) Resp.: 14 (Unlabored) BP: 124/78 (Sitting, Left Arm, Standard)  Physical Exam The physical exam findings are as follows:  Note: Patient is a 56 year old female with continued instability of the right knee.   General Mental Status - Alert, cooperative and good historian. General Appearance- pleasant. Not in acute distress. Orientation- Oriented X3. Build & Nutrition- Well nourished and Well developed.   Head  and Neck Head- normocephalic, atraumatic . Neck Global Assessment- supple. no bruit auscultated on the right and no bruit auscultated on the left.   Eye Pupil- Bilateral- Regular and Round. Note: wears glasses Motion- Bilateral- EOMI.   Chest and Lung Exam Auscultation: Breath sounds:- clear at anterior chest wall and - clear at posterior chest wall. Adventitious sounds:- No Adventitious sounds.   Cardiovascular Auscultation:Rhythm- Regular rate and rhythm. Heart Sounds- S1 WNL and S2 WNL. Murmurs & Other Heart Sounds:Auscultation of the heart reveals - No Murmurs.   Abdomen Palpation/Percussion:Tenderness- Abdomen is non-tender to palpation. Rigidity (guarding)- Abdomen is soft. Auscultation:Auscultation of the abdomen reveals - Bowel sounds normal.   Female Genitourinary  Not done, not pertinent to present illness  Musculoskeletal  On exam she is alert and oriented in no apparent distress. Her right knee shows no swelling. Range is hyperextension to 5 degrees down to flexion of 120. There is fairly significant laxity in full extension to varus and valgus stressing and significant laxity at 90 degrees of flexion to AP stressing.  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 Aluisio.  Plan is to go home.  Patient had problems with postoperative nausea and orthostatic hypotension for the first day and a half.  PCP - Dr. Wong Cards - Dr. McDowell Pulm - Dr. Ed Hawkins - Patient has been seen preoperatively and felt to stable for surgery.  Signed electronically by DREW L PERKINS, PA-C 

## 2012-05-05 NOTE — Interval H&P Note (Signed)
History and Physical Interval Note:  05/05/2012 10:31 AM  [name]  has presented today for surgery, with the diagnosis of Right Total Knee Arthroplasty Unstable  The various methods of treatment have been discussed with the patient and family. After consideration of risks, benefits and other options for treatment, the patient has consented to  Procedure(s) (LRB) with comments: TOTAL KNEE REVISION (Right) as a surgical intervention .  The patient's history has been reviewed, patient examined, no change in status, stable for surgery.  I have reviewed the patient's chart and labs.  Questions were answered to the patient's satisfaction.     Loanne Drilling

## 2012-05-05 NOTE — Anesthesia Preprocedure Evaluation (Addendum)
Anesthesia Evaluation  Patient identified by MRN, date of birth, ID band Patient awake    Reviewed: Allergy & Precautions, H&P , NPO status , Patient's Chart, lab work & pertinent test results  History of Anesthesia Complications (+) PONV  Airway Mallampati: II TM Distance: >3 FB Neck ROM: Full    Dental No notable dental hx.    Pulmonary COPDCurrent Smoker,  breath sounds clear to auscultation  Pulmonary exam normal       Cardiovascular negative cardio ROS  Rhythm:Regular Rate:Normal     Neuro/Psych Multiple Sclerosis negative neurological ROS  negative psych ROS   GI/Hepatic negative GI ROS, Neg liver ROS,   Endo/Other  diabetes, Type 2, Oral Hypoglycemic Agents  Renal/GU negative Renal ROS  negative genitourinary   Musculoskeletal negative musculoskeletal ROS (+)   Abdominal   Peds negative pediatric ROS (+)  Hematology negative hematology ROS (+)   Anesthesia Other Findings Upper front right crowns  Reproductive/Obstetrics negative OB ROS                          Anesthesia Physical Anesthesia Plan  ASA: III  Anesthesia Plan: General   Post-op Pain Management:    Induction: Intravenous  Airway Management Planned: Oral ETT  Additional Equipment:   Intra-op Plan:   Post-operative Plan: Extubation in OR  Informed Consent: I have reviewed the patients History and Physical, chart, labs and discussed the procedure including the risks, benefits and alternatives for the proposed anesthesia with the patient or authorized representative who has indicated his/her understanding and acceptance.   Dental advisory given  Plan Discussed with: CRNA  Anesthesia Plan Comments: (SAB is relative contraindicated with MS. Will also give peri-op steroids with GA)        Anesthesia Quick Evaluation

## 2012-05-05 NOTE — Transfer of Care (Signed)
Immediate Anesthesia Transfer of Care Note  Patient: [name]  Procedure(s) Performed: Procedure(s) (LRB) with comments: TOTAL KNEE REVISION (Right)  Patient Location: PACU  Anesthesia Type: General  Level of Consciousness: oriented, sedated and patient cooperative  Airway & Oxygen Therapy: Patient Spontanous Breathing and Patient connected to face mask oxygen  Post-op Assessment: Report given to PACU RN, Post -op Vital signs reviewed and stable and Patient moving all extremities  Post vital signs: Reviewed and stable  Complications: No apparent anesthesia complications

## 2012-05-06 ENCOUNTER — Encounter (HOSPITAL_COMMUNITY): Payer: Self-pay | Admitting: Orthopedic Surgery

## 2012-05-06 DIAGNOSIS — E871 Hypo-osmolality and hyponatremia: Secondary | ICD-10-CM

## 2012-05-06 LAB — BASIC METABOLIC PANEL
BUN: 7 mg/dL (ref 6–23)
Chloride: 98 mEq/L (ref 96–112)
GFR calc Af Amer: >90 mL/min (ref >90)
Glucose, Bld: 146 mg/dL — ABNORMAL HIGH (ref 70–99)
Potassium: 4.1 mEq/L (ref 3.5–5.1)

## 2012-05-06 LAB — CBC
HCT: 33.9 % — ABNORMAL LOW (ref 36.0–46.0)
Hemoglobin: 11 g/dL — ABNORMAL LOW (ref 12.0–15.0)
RDW: 12 % (ref 11.5–15.5)
WBC: 11.3 10*3/uL — ABNORMAL HIGH (ref 4.0–10.5)

## 2012-05-06 LAB — GLUCOSE, CAPILLARY
Glucose-Capillary: 149 mg/dL — ABNORMAL HIGH (ref 70–99)
Glucose-Capillary: 153 mg/dL — ABNORMAL HIGH (ref 70–99)
Glucose-Capillary: 114 mg/dL — ABNORMAL HIGH (ref 70–99)

## 2012-05-06 MED ORDER — MORPHINE SULFATE 2 MG/ML IJ SOLN: 1.0000 mg | INTRAMUSCULAR | Status: DC | PRN

## 2012-05-06 MED ORDER — OMEPRAZOLE 20 MG PO CPDR
20.0000 mg | DELAYED_RELEASE_CAPSULE | Freq: Every day | ORAL | Status: DC
  Administered 2012-05-06 – 2012-05-07 (×2): 20 mg via ORAL
  Filled 2012-05-06 (×2): qty 1

## 2012-05-06 MED ORDER — GABAPENTIN 300 MG PO CAPS
600.0000 mg | ORAL_CAPSULE | ORAL | Status: AC
  Administered 2012-05-06: 600 mg via ORAL
  Filled 2012-05-06: qty 2

## 2012-05-06 MED ORDER — NON FORMULARY: 20.0000 mg | Freq: Every day | Status: DC

## 2012-05-06 NOTE — Care Management Note (Unsigned)
    Page 1 of 2   05/06/2012     12:26:08 PM   CARE MANAGEMENT NOTE 05/06/2012  Patient:  [name], [name]   Account Number:  [id number]  Date Initiated:  05/06/2012  Documentation initiated by:  Colleen Can  Subjective/Objective Assessment:   dx unstable total knee arthroplasty-right; revision total knee arthroplasty     Action/Plan:   CM spoke wuth patient. Plans are for patient to return to her home in [place], [place] [facility]) where spouse and other family members will be caregivers. Pt states she has bsc and cane. Will need RW   Anticipated DC Date:  05/08/2012   Anticipated DC Plan:  HOME W HOME HEALTH SERVICES  In-house referral  NA      DC Planning Services  CM consult      [facility] Choice  HOME HEALTH  DURABLE MEDICAL EQUIPMENT   Choice offered to / List presented to:  C-1 Patient   DME arranged  Levan Hurst      DME agency  [facility]        Status of service:  In process, will continue to follow Medicare Important Message given?   (If response is "NO", the following Medicare IM given date fields will be blank) Date Medicare IM given:   Date Additional Medicare IM given:    Discharge Disposition:    Per UR Regulation:    If discussed at Long Length of Stay Meetings, dates discussed:    Comments:  05/06/2012 Raynelle Bring BSN CCM [phone] Pt is requesting [facility] 1st choice. 2nd choice any other network provider of [facility] services. [facility] is unable to provide services for PT in area that patients lives. CM will research for [facility] agency.

## 2012-05-06 NOTE — Evaluation (Signed)
Physical Therapy Evaluation Patient Details Name: [name] MRN: [id number] DOB: 05/18/1956 Today's Date: 05/06/2012 Time: 4098-1191 PT Time Calculation (min): 13 min  PT Assessment / Plan / Recommendation Clinical Impression  Pt s/p revision of R TKR.  Pt would benefit from acute PT services in order to improve independence with transfers, ambulation, and stairs to prepare for d/c home with spouse.     PT Assessment  Patient needs continued PT services    Follow Up Recommendations  Home health PT    Barriers to Discharge        Equipment Recommendations  Rolling walker with 5" wheels    Recommendations for Other Services     Frequency 7X/week    Precautions / Restrictions Precautions Precautions: Knee Required Braces or Orthoses: Knee Immobilizer - Right Knee Immobilizer - Right: Discontinue once straight leg raise with < 10 degree lag Restrictions RLE Weight Bearing: Weight bearing as tolerated   Pertinent Vitals/Pain 4/10 R knee pain, ice applied, repositioned      Mobility  Bed Mobility Bed Mobility: Supine to Sit Supine to Sit: 4: Min assist;HOB elevated Details for Bed Mobility Assistance: assist for R LE, verbal cues for technique Transfers Transfers: Stand to Sit;Sit to Stand Sit to Stand: 4: Min assist;With upper extremity assist;From bed Stand to Sit: 4: Min assist;With upper extremity assist;To chair/3-in-1 Details for Transfer Assistance: verbal cues for technique Ambulation/Gait Ambulation/Gait Assistance: 4: Min guard Ambulation Distance (Feet): 60 Feet Assistive device: Rolling walker Ambulation/Gait Assistance Details: verbal cues for sequence and safe RW distance Gait Pattern: Step-through pattern;Decreased stance time - right;Antalgic    Shoulder Instructions     Exercises     PT Diagnosis: Difficulty walking;Acute pain  PT Problem List: Decreased strength;Decreased range of motion;Decreased activity tolerance;Decreased  mobility;Decreased knowledge of use of DME;Pain PT Treatment Interventions: DME instruction;Gait training;Functional mobility training;Therapeutic activities;Therapeutic exercise;Patient/family education;Stair training   PT Goals Acute Rehab PT Goals PT Goal Formulation: With patient Time For Goal Achievement: 05/13/12 Potential to Achieve Goals: Good Pt will go Supine/Side to Sit: with modified independence PT Goal: Supine/Side to Sit - Progress: Goal set today Pt will go Sit to Supine/Side: with modified independence PT Goal: Sit to Supine/Side - Progress: Goal set today Pt will go Sit to Stand: with modified independence PT Goal: Sit to Stand - Progress: Goal set today Pt will go Stand to Sit: with modified independence PT Goal: Stand to Sit - Progress: Goal set today Pt will Ambulate: >150 feet;with supervision;with least restrictive assistive device Pt will Go Up / Down Stairs: 1-2 stairs;with supervision;with least restrictive assistive device PT Goal: Up/Down Stairs - Progress: Goal set today Pt will Perform Home Exercise Program: with supervision, verbal cues required/provided PT Goal: Perform Home Exercise Program - Progress: Goal set today  Visit Information  Last PT Received On: 05/06/12 Assistance Needed: +1    Subjective Data  Subjective: This is a revision.  I had the first one done 3 yrs ago and it's given me trouble since.   Prior Functioning  Home Living Lives With: Spouse Available Help at Discharge: Family;Friend(s) Type of Home: House Home Access: Stairs to enter [employer] of Steps: 1 and 1 (wide enough to fit RW) Entrance Stairs-Rails: None Home Layout: Able to live on main level with bedroom/bathroom Home Adaptive Equipment: Straight cane;Bedside commode/3-in-1 Prior Function Level of Independence: Independent Communication Communication: No difficulties    Cognition  Overall Cognitive Status: Appears within functional limits for tasks  assessed/performed Arousal/Alertness: Awake/alert Orientation  Level: Appears intact for tasks assessed Behavior During Session: [facility] for tasks performed    Extremity/Trunk Assessment Right Upper Extremity Assessment RUE ROM/Strength/Tone: [facility] for tasks assessed Left Upper Extremity Assessment LUE ROM/Strength/Tone: WFL for tasks assessed Right Lower Extremity Assessment RLE ROM/Strength/Tone: Deficits RLE ROM/Strength/Tone Deficits: good quad contraction, ROM TBA, assist for LE with mobility Left Lower Extremity Assessment LLE ROM/Strength/Tone: WFL for tasks assessed   Balance    End of Session PT - End of Session Equipment Utilized During Treatment: Gait belt;Right knee immobilizer Activity Tolerance: Patient tolerated treatment well Patient left: in chair;with call bell/phone within reach  GP     Ceejay Kegley,[name] 05/06/2012, 11:15 AM Pager: [phone]

## 2012-05-06 NOTE — Progress Notes (Signed)
   Subjective: 1 Day Post-Op Procedure(s) (LRB): TOTAL KNEE REVISION (Right) Patient reports pain as mild.   Patient seen in rounds by Dr. Lequita Halt.  Patient is doing great this morning. Patient is well, and has had no acute complaints or problems We will start therapy today.  Plan is to go Home after hospital stay.  Objective: Vital signs in last 24 hours: Temp:  [97.3 F (36.3 C)-98.4 F (36.9 C)] 98.4 F (36.9 C) (09/26 0553) Pulse Rate:  [54-66] 60  (09/26 0553) Resp:  [7-17] 13  (09/26 0553) BP: (110-152)/(49-73) 152/69 mmHg (09/26 0553) SpO2:  [94 %-100 %] 100 % (09/26 0553) Weight:  [100.245 kg (221 lb)] 100.245 kg (221 lb) (09/25 1700)  Intake/Output from previous day:  Intake/Output Summary (Last 24 hours) at 05/06/12 0752 Last data filed at 05/06/12 0554  Gross per 24 hour  Intake   3940 ml  Output   2670 ml  Net   1270 ml    Intake/Output this shift:    Labs:  Basename 05/06/12 0350 05/03/12 1525  HGB 11.0* 14.3    Basename 05/06/12 0350 05/03/12 1525  WBC 11.3* 9.3  RBC 3.72* 4.79  HCT 33.9* 43.5  PLT 206 241    Basename 05/06/12 0350 05/03/12 1525  NA 134* 140  K 4.1 4.8  CL 98 102  CO2 29 28  BUN 7 10  CREATININE 0.74 0.77  GLUCOSE 146* 91  CALCIUM 8.3* 9.3    Basename 05/03/12 1525  LABPT --  INR 1.00    EXAM General - Patient is Alert, Appropriate and Oriented Extremity - Neurovascular intact Sensation intact distally Dorsiflexion/Plantar flexion intact Dressing - dressing C/D/I Motor Function - intact, moving foot and toes well on exam.  Hemovac pulled without difficulty.  Past Medical History  Diagnosis Date  . Multiple sclerosis   . Type 2 diabetes mellitus   . GERD (gastroesophageal reflux disease)   . COPD (chronic obstructive pulmonary disease)   . Mixed hyperlipidemia   . PONV (postoperative nausea and vomiting)   . Weakness of left side of body     DUE TO MS  . Arthritis   . Psoriasis   . Cancer     SKIN  .  Neuropathy     FEET    Assessment/Plan: 1 Day Post-Op Procedure(s) (LRB): TOTAL KNEE REVISION (Right) Principal Problem:  *Failed total knee arthroplasty Active Problems:  Postop Hyponatremia   Advance diet Up with therapy Plan for discharge tomorrow Discharge home with home health  DVT Prophylaxis - Xarelto Weight-Bearing as tolerated to right leg No vaccines. D/C PCA Morphine, Change to IV push D/C O2 and Pulse OX and try on Room Air  PERKINS, Marlowe Sax 05/06/2012, 7:52 AM

## 2012-05-06 NOTE — Op Note (Signed)
NAMECRESCENTIA, BOUTWELL               ACCOUNT NO.:  [id number]  MEDICAL RECORD NO.:  [id number]  LOCATION:  1605                         FACILITY:  [facility]  PHYSICIAN:  Ollen Gross, M.D.    DATE OF BIRTH:  [birth year]  DATE OF PROCEDURE:  05/05/2012 DATE OF DISCHARGE:                              OPERATIVE REPORT   PREOPERATIVE DIAGNOSIS:  Unstable right total knee arthroplasty.  POSTOPERATIVE DIAGNOSIS:  Unstable right total knee arthroplasty.  PROCEDURE:  Right total knee arthroplasty.  SURGEON:  Ollen Gross, MD  ASSISTANT:  Dimitri Ped, PA-C  ANESTHESIA:  General.  ESTIMATED BLOOD LOSS:  Minimal.  DRAINS:  Hemovac x1.  TOURNIQUET TIME:  42 minutes at 300 mmHg down 10 minutes, then up additional 22 minutes at 300 mmHg.  COMPLICATIONS:  None.  CONDITION:  Stable to recovery.  BRIEF CLINICAL NOTE:  Ms. Berthelot is a 56 year old female who had a total knee arthroplasty done in 2010, never had relief of pain and always had instability about the knee.  She has had infection workup which was negative.  She presents now for revision total knee arthroplasty due to persistent instability and persistent pain associated with the knee.  PROCEDURE IN DETAIL:  After successful administration of general anesthetic, tourniquet was placed high on the right thigh.  Right lower extremity prepped and draped in the usual sterile fashion.  Extremity was wrapped in Esmarch, knee flexed, tourniquet inflated to 300 mmHg.  A midline incision was made with a 10 blade through subcutaneous tissue to the level of the extensor mechanism.  Fresh blade was used to make a medial parapatellar arthrotomy.  We did not encounter any fluid in the joint.  The soft tissue on the proximal medial tibia was then subperiosteally elevated to the joint line with a knife and into the semimembranosus bursa with a Cobb elevator.  Soft tissue laterally was elevated with attention being paid to avoiding the patellar  tendon on the tibial tubercle.  The patella was everted and knee flexed to 90 degrees.  I was easily able to remove the tibial polyethylene.  She had gross laxity in flexion and extension.  I felt as though a thicker insert would not have been appropriate to solve this.  She had LCS prosthesis in and had excess slope of the tibia, although she was bound to get flexion instability even with a thicker insert.  We thus decided to go ahead and revise the knee.  Using osteotomes, I disrupted the interface between the femoral component and bone, removing the femoral component with no bone loss. The tibia was then subluxed forward and retractors are placed.  The tibial component was removed by disrupting interface between the component and bone using an oscillating saw and then using osteotomes to remove it.  The tibial component was removed very easily.  Again there was no bone loss.  I removed the rest of the cement to gain access to the tibia and femoral canals.  The canal was thoroughly irrigated and reamed on the femoral side up to 18 mm on the tibial side to 13 mm.  The tibial retractors were placed again and the extramedullary tibial cutting guide placed.  Referencing proximally at the medial aspect of tibial tubercle distally along the second metatarsal axis and tibial crest, the block was pinned to remove 2 mm from the cut bone surface. Resection was made with an oscillating saw.  Size 4 was the most appropriate tibial component.  We prepared the proximal tibia to modular drill and then also prepared with a 13 x 30 stem extension.  I also brought up repair with a 29 mm sleeve.  The trial was placed which was a size 4 MBT revision tray with a 29 mm sleeve and a 13 x 30 stem extension.  Given that her joint line was already low, I decided to place 5 mm augments on each side medial and lateral in order to build up to appropriate level.  On the femoral side, we then placed the 18 mm  reamer to serve as our intramedullary cutting guide.  The 5-degree right valgus alignment guide was placed, exactly about 2 mm of the distal surface.  Size 4 is the most appropriate femoral component.  The size 4 cutting block was placed in a +2 position to effectively raise the stem and lower the component to the anterior cortex of the femur.  The rotation was marked at the epicondylar axis and confirmed by placing a spacer block with the knee at 90 degrees of flexion to get a rectangular flexion gap.  Block was pinned in this position.  Minimal bone was resected anterior and posterior.  I did with a +4 position to get any resection posterior. Thus 4 mm posterior augments were necessary medial and lateral.  We then removed that block, placed the intercondylar block and made the cut for the TC3.  The trial femoral component was then placed, which is a size 4 TC3 with a 18 x 75 stem extension in a +2 position and 4 mm posterior augments medial and lateral.  A 12.5 insert had been removed before.  I tried a 15 mm insert and 15 has excellent stability with full extension, ranging all the way down through full flexion, no AP or varus valgus laxity in any plane of motion.  I then removed the polyethylene for the LCS patella and then removed the metal backed patella.  Residual thickness is about 14 mm.  I resected down to 12 mm and template for 41, drilled the lug holes, placed the trial patella and tracks normally.  We then released the tourniquet for a total time of 42 minutes.  I inspected throughout the joint.  There is minimal bleeding.  Moist sponge was placed and the knee held in full extension while the components were assembled on the back table.  Tourniquet was down for a total of 10 minutes.  Then we rewrapped the leg in Esmarch and reinflated the tourniquet to 300 mmHg.  The components assembled on the back table are as follows.  On the tibial side, a size 4 MBT revision tray 5  mm medial and lateral augments, 29 sleeve, and a 13 x 30 stem extension.  On the femoral side, size 4 TC3 femur with 18 x 75 stem and a +2 position 5 degrees right valgus with 4 mm medial and lateral posterior augments.  Once tourniquet reinflated, we removed the trials and I sized the cement restrictor and the tibia which was a 4.  It was placed at the appropriate depth in the tibia.  Cut bone surfaces were then prepared with pulsatile lavage and cement mixed.  We used 3 batches  of gentamicin impregnated cement.  Tibial component was cemented first, both in the canal and on the tibial bone surface.  Femoral components cemented distally with a press-fit stem.  Both components were impacted and all extruded cement removed.  Trial 15 mm inserts placed, knee held in full extension and remainder of the extruded cement removed.  The 41 patella was also cemented in place and held with a clamp.  Once cement was fully hardened, the knee was again placed through range of motion with excellent varus-valgus and anterior-posterior stability through all ranges of motion.  Trials removed and a permanent 15 mm TC3 rotating platform insert was placed into the tibial tray.  The knee was reduced again with the same stability.  Wound was copiously irrigated with saline solution and the arthrotomy closed over a Hemovac drain with a running #1 V-Loc suture.  Tourniquet was released for a second tourniquet time of 22 minutes, total of 64 minutes altogether.  The wound was further irrigated and the subcu closed over a second Hemovac drain with interrupted 2-0 Vicryl.  Skin was closed with staples.  Catheter for Marcaine pain pump was placed and pumps initiated.  Incision clean and dried and a bulky sterile dressing applied.  She was placed into a knee immobilizer, awakened, and transported to recovery in stable condition.     Ollen Gross, M.D.     FA/MEDQ  D:  05/05/2012  T:  05/06/2012  Job:   [id number]

## 2012-05-06 NOTE — Progress Notes (Signed)
Physical Therapy Treatment Note   05/06/12 1400  PT Visit Information  Last PT Received On 05/06/12  Assistance Needed +1  PT Time Calculation  PT Start Time 1249  PT Stop Time 1314  PT Time Calculation (min) 25 min  Subjective Data  Subjective I feel the urge to go to the bathroom.  Precautions  Precautions Knee  Required Braces or Orthoses Knee Immobilizer - Right  Knee Immobilizer - Right Discontinue once straight leg raise with < 10 degree lag  Restrictions  RLE Weight Bearing WBAT  Cognition  Overall Cognitive Status Appears within functional limits for tasks assessed/performed  Bed Mobility  Bed Mobility Supine to Sit;Sit to Supine  Supine to Sit 4: Min assist;HOB elevated  Sit to Supine 4: Min assist;HOB elevated  Details for Bed Mobility Assistance assist for R LE, verbal cues for technique  Transfers  Transfers Stand to Sit;Sit to Stand  Sit to Stand 4: Min assist;With upper extremity assist;From bed;From chair/3-in-1  Stand to Sit 4: Min assist;With upper extremity assist;To chair/3-in-1;To bed  Details for Transfer Assistance verbal cues for technique  Ambulation/Gait  Ambulation/Gait Assistance 4: Min guard  Ambulation Distance (Feet) 8 Feet (x2)  Assistive device Rolling walker  Ambulation/Gait Assistance Details pt ambulated to/from bathroom  Gait Pattern Step-through pattern;Decreased stance time - right;Antalgic  Total Joint Exercises  Ankle Circles/Pumps AROM;Both;20 reps  Quad Sets AROM;Both;20 reps  Short Arc [place];Right;20 reps  Heel Slides AAROM;Right;15 reps  Hip ABduction/ADduction AROM;15 reps;Right  Straight Leg Raises AAROM;Right;10 reps  Goniometric ROM R knee AAROM -7-45* limited by pain and guarding  PT - End of Session  Equipment Utilized During Treatment Right knee immobilizer  Activity Tolerance Patient tolerated treatment well  Patient left in bed;with call bell/phone within reach;with family/visitor present  PT - Assessment/Plan    Comments on Treatment Session Pt performed exercises and ambulated to/from bathroom.  PT Plan Discharge plan remains appropriate;Frequency remains appropriate  Follow Up Recommendations Home health PT  Equipment Recommended Rolling walker with 5" wheels  Acute Rehab PT Goals  PT Goal: Supine/Side to Sit - Progress Progressing toward goal  PT Goal: Sit to Supine/Side - Progress Progressing toward goal  PT Goal: Sit to Stand - Progress Progressing toward goal  PT Goal: Stand to Sit - Progress Progressing toward goal  PT Goal: Ambulate - Progress Progressing toward goal  PT Goal: Perform Home Exercise Program - Progress Progressing toward goal  PT General Charges  $$ ACUTE PT VISIT 1 Procedure  PT Treatments  $Gait Training 8-22 mins  $Therapeutic Exercise 8-22 mins    Zenovia Jarred, PT Pager: [phone]

## 2012-05-07 LAB — GLUCOSE, CAPILLARY
Glucose-Capillary: 135 mg/dL — ABNORMAL HIGH (ref 70–99)
Glucose-Capillary: 167 mg/dL — ABNORMAL HIGH (ref 70–99)

## 2012-05-07 LAB — BASIC METABOLIC PANEL
BUN: 5 mg/dL — ABNORMAL LOW (ref 6–23)
Chloride: 100 mEq/L (ref 96–112)
GFR calc Af Amer: 89 mL/min — ABNORMAL LOW (ref >90)
Potassium: 3.6 mEq/L (ref 3.5–5.1)
Sodium: 137 mEq/L (ref 135–145)

## 2012-05-07 LAB — CBC
HCT: 32.5 % — ABNORMAL LOW (ref 36.0–46.0)
Platelets: 191 10*3/uL (ref 150–400)
RDW: 12.1 % (ref 11.5–15.5)
WBC: 12.5 10*3/uL — ABNORMAL HIGH (ref 4.0–10.5)

## 2012-05-07 MED ORDER — AZITHROMYCIN 500 MG PO TABS
500.0000 mg | ORAL_TABLET | Freq: Every day | ORAL | Status: DC
  Filled 2012-05-07: qty 1

## 2012-05-07 MED ORDER — AZITHROMYCIN 250 MG PO TABS: ORAL_TABLET | ORAL | Status: DC

## 2012-05-07 MED ORDER — AZITHROMYCIN 500 MG PO TABS: 500.0000 mg | ORAL_TABLET | Freq: Every day | ORAL | Status: DC

## 2012-05-07 MED ORDER — OXYCODONE HCL 5 MG PO TABS: 5.0000 mg | ORAL_TABLET | ORAL | Status: DC | PRN

## 2012-05-07 MED ORDER — METHOCARBAMOL 500 MG PO TABS: 500.0000 mg | ORAL_TABLET | Freq: Four times a day (QID) | ORAL | Status: DC | PRN

## 2012-05-07 MED ORDER — RIVAROXABAN 10 MG PO TABS: 10.0000 mg | ORAL_TABLET | Freq: Every day | ORAL | Status: DC

## 2012-05-07 MED ORDER — AZITHROMYCIN 250 MG PO TABS: 250.0000 mg | ORAL_TABLET | Freq: Every day | ORAL | Status: DC

## 2012-05-07 NOTE — Progress Notes (Signed)
Occupational Therapy Note Order noted. Spoke to pt and she states she has all DME and assist PRN at discharge. She is familiar with ADL techniques from her previous surgery and declines need for acute OT. Will sign off. Judithann Sauger OTR/L [phone] 05/07/2012

## 2012-05-07 NOTE — Discharge Summary (Signed)
Physician Discharge Summary   Patient ID: [name] MRN: [id number] DOB/AGE: 54-Aug-1957 54 y.o.  Admit date: 05/05/2012 Discharge date: 05/07/2012  Primary Diagnosis: Unstable right total knee arthroplasty.   Admission Diagnoses:  Past Medical History  Diagnosis Date  . Multiple sclerosis   . Type 2 diabetes mellitus   . GERD (gastroesophageal reflux disease)   . COPD (chronic obstructive pulmonary disease)   . Mixed hyperlipidemia   . PONV (postoperative nausea and vomiting)   . Weakness of left side of body     DUE TO MS  . Arthritis   . Psoriasis   . Cancer     SKIN  . Neuropathy     FEET   Discharge Diagnoses:   Principal Problem:  *Failed total knee arthroplasty Active Problems:  Postop Hyponatremia  Procedure:  Procedure(s) (LRB): TOTAL KNEE REVISION (Right)   Consults: None  HPI: [name] is a 56 year old female who had a total  knee arthroplasty done in 2010, never had relief of pain and always had  instability about the knee. She has had infection workup which was  negative. She presents now for revision total knee arthroplasty due to  persistent instability and persistent pain associated with the knee.   Laboratory Data: Hospital Outpatient Visit on 05/03/2012  Component Date Value Range Status  . aPTT 05/03/2012 32  24 - 37 seconds Final  . Sodium 05/03/2012 140  135 - 145 mEq/L Final  . Potassium 05/03/2012 4.8  3.5 - 5.1 mEq/L Final   Comment: SLIGHT HEMOLYSIS                          HEMOLYSIS AT THIS LEVEL MAY AFFECT RESULT  . Chloride 05/03/2012 102  96 - 112 mEq/L Final  . CO2 05/03/2012 28  19 - 32 mEq/L Final  . Glucose, Bld 05/03/2012 91  70 - 99 mg/dL Final  . BUN 62/13/0865 10  6 - 23 mg/dL Final  . Creatinine, Ser 05/03/2012 0.77  0.50 - 1.10 mg/dL Final  . Calcium 78/46/9629 9.3  8.4 - 10.5 mg/dL Final  . Total Protein 05/03/2012 6.7  6.0 - 8.3 g/dL Final  . Albumin 52/84/1324 3.6  3.5 - 5.2 g/dL Final  . AST 40/05/2724 19   0 - 37 U/L Final  . ALT 05/03/2012 9  0 - 35 U/L Final  . Alkaline Phosphatase 05/03/2012 74  39 - 117 U/L Final  . Total Bilirubin 05/03/2012 0.3  0.3 - 1.2 mg/dL Final  . GFR calc non Af Amer 05/03/2012 >90  >90 mL/min Final  . GFR calc Af Amer 05/03/2012 >90  >90 mL/min Final   Comment:                                 The eGFR has been calculated                          using the CKD EPI equation.                          This calculation has not been                          validated in all clinical  situations.                          eGFR's persistently                          <90 mL/min signify                          possible Chronic Kidney Disease.  Marland Kitchen Prothrombin Time 05/03/2012 13.1  11.6 - 15.2 seconds Final  . INR 05/03/2012 1.00  0.00 - 1.49 Final  . WBC 05/03/2012 9.3  4.0 - 10.5 K/uL Final  . RBC 05/03/2012 4.79  3.87 - 5.11 MIL/uL Final  . Hemoglobin 05/03/2012 14.3  12.0 - 15.0 g/dL Final  . HCT 11/91/4782 43.5  36.0 - 46.0 % Final  . MCV 05/03/2012 90.8  78.0 - 100.0 fL Final  . MCH 05/03/2012 29.9  26.0 - 34.0 pg Final  . MCHC 05/03/2012 32.9  30.0 - 36.0 g/dL Final  . RDW 95/62/1308 12.0  11.5 - 15.5 % Final  . Platelets 05/03/2012 241  150 - 400 K/uL Final  . Color, Urine 05/03/2012 YELLOW  YELLOW Final  . APPearance 05/03/2012 CLOUDY* CLEAR Final  . Specific Gravity, Urine 05/03/2012 1.026  1.005 - 1.030 Final  . pH 05/03/2012 6.0  5.0 - 8.0 Final  . Glucose, UA 05/03/2012 NEGATIVE  NEGATIVE mg/dL Final  . Hgb urine dipstick 05/03/2012 NEGATIVE  NEGATIVE Final  . Bilirubin Urine 05/03/2012 NEGATIVE  NEGATIVE Final  . Ketones, ur 05/03/2012 NEGATIVE  NEGATIVE mg/dL Final  . Protein, ur 65/78/4696 NEGATIVE  NEGATIVE mg/dL Final  . Urobilinogen, UA 05/03/2012 0.2  0.0 - 1.0 mg/dL Final  . Nitrite 29/52/8413 NEGATIVE  NEGATIVE Final  . Leukocytes, UA 05/03/2012 MODERATE* NEGATIVE Final  . WBC, UA 05/03/2012 7-10  <3 WBC/hpf Final    . Bacteria, UA 05/03/2012 FEW* RARE Final  . Urine-Other 05/03/2012 MUCOUS PRESENT   Final  . MRSA, PCR 05/03/2012 NEGATIVE  NEGATIVE Final  . Staphylococcus aureus 05/03/2012 NEGATIVE  NEGATIVE Final   Comment:                                 The Xpert SA Assay (FDA                          approved for NASAL specimens                          in patients over 94 years of age),                          is one component of                          a comprehensive surveillance                          program.  Test performance has                          been validated by [employer]  [facility] for patients greater                          than or equal to 45 year old.                          It is not intended                          to diagnose infection nor to                          guide or monitor treatment.    Basename 05/07/12 0400 05/06/12 0350  HGB 10.5* 11.0*    Basename 05/07/12 0400 05/06/12 0350  WBC 12.5* 11.3*  RBC 3.59* 3.72*  HCT 32.5* 33.9*  PLT 191 206    Basename 05/07/12 0400 05/06/12 0350  NA 137 134*  K 3.6 4.1  CL 100 98  CO2 28 29  BUN 5* 7  CREATININE 0.85 0.74  GLUCOSE 155* 146*  CALCIUM 8.5 8.3*   No results found for this basename: LABPT:2,INR:2 in the last 72 hours  X-Rays:No results found.  EKG: Orders placed in visit on 03/30/12  . EKG 12-LEAD     Hospital Course:  [name] is a 56 y.o. who was admitted to [facility]. They were brought to the operating room on 05/05/2012 and underwent Procedure(s): TOTAL KNEE REVISION.  Patient tolerated the procedure well and was later transferred to the recovery room and then to the orthopaedic floor for postoperative care.  They were given PO and IV analgesics for pain control following their surgery.  They were given 24 hours of postoperative antibiotics of  Anti-infectives     Start     Dose/Rate Route Frequency Ordered Stop   05/08/12 1000   azithromycin  (ZITHROMAX) tablet 250 mg  Status:  Discontinued        250 mg Oral Daily 05/07/12 0900 05/07/12 0907   05/07/12 1000   azithromycin (ZITHROMAX) tablet 500 mg  Status:  Discontinued        500 mg Oral Daily 05/07/12 0900 05/07/12 0907   05/07/12 0000   azithromycin (ZITHROMAX) 500 MG tablet        500 mg Oral Daily 05/07/12 0905     05/07/12 0000   azithromycin (ZITHROMAX) 250 MG tablet           05/07/12 0905     05/05/12 1700   ceFAZolin (ANCEF) IVPB 1 g/50 mL premix        1 g 100 mL/hr over 30 Minutes Intravenous Every 6 hours 05/05/12 1511 05/05/12 2250   05/05/12 0741   ceFAZolin (ANCEF) 3 g in dextrose 5 % 50 mL IVPB        3 g 160 mL/hr over 30 Minutes Intravenous 60 min pre-op 05/05/12 0741 05/05/12 1100         and started on DVT prophylaxis in the form of Xarelto.   PT and OT were ordered for total joint protocol.  Discharge planning consulted to help with postop disposition and equipment needs.  Patient had a good night on the evening of surgery and started to get up OOB with therapy on day one.  PCA Morphine was discontinued and they were weaned over to PO meds.  Hemovac drain was pulled without difficulty.  Continued  to work with therapy into day two.  Dressing was changed on day two and the incision was doing well and healing well.  Patient was seen in rounds and was ready to go home later that day since she was doing well.   Discharge Medications: Prior to Admission medications   Medication Sig Start Date End Date Taking? Authorizing Provider  acetaminophen (TYLENOL) 500 MG tablet Take 500 mg by mouth every 6 (six) hours as needed. For pain   Yes Historical Provider, MD  Aclidinium Bromide (TUDORZA PRESSAIR) 400 MCG/ACT AEPB Inhale 1 puff into the lungs 2 (two) times daily.   Yes Historical Provider, MD  albuterol (VENTOLIN HFA) 108 (90 BASE) MCG/ACT inhaler Inhale 2 puffs into the lungs every 6 (six) hours as needed. For shortness of breath   Yes Historical Provider,  MD  furosemide (LASIX) 20 MG tablet Take 20 mg by mouth Once daily as needed. For fluid 12/26/11  Yes Historical Provider, MD  gabapentin (NEURONTIN) 600 MG tablet Take 600 mg by mouth Three times a day.  12/26/11  Yes Historical Provider, MD  Hypromellose (SYSTANE OVERNIGHT THERAPY) 0.3 % GEL Place 1 drop into both eyes at bedtime as needed.   Yes Historical Provider, MD  metFORMIN (GLUCOPHAGE) 500 MG tablet Take 500 mg by mouth Daily.  03/22/12  Yes Historical Provider, MD  omeprazole (PRILOSEC) 20 MG capsule Take 20 mg by mouth daily.  12/26/11  Yes Historical Provider, MD  azithromycin (ZITHROMAX) 250 MG tablet Take as directed 05/07/12   Catherine Oak Julien Girt, PA  azithromycin (ZITHROMAX) 500 MG tablet Take 1 tablet (500 mg total) by mouth daily. 05/07/12   Consuelo Thayne Julien Girt, PA  methocarbamol (ROBAXIN) 500 MG tablet Take 1 tablet (500 mg total) by mouth every 6 (six) hours as needed. 05/07/12   Nason Conradt, PA  oxyCODONE (OXY IR/ROXICODONE) 5 MG immediate release tablet Take 1-2 tablets (5-10 mg total) by mouth every 3 (three) hours as needed. 05/07/12   Sylus Stgermain Julien Girt, PA  rivaroxaban (XARELTO) 10 MG TABS tablet Take 1 tablet (10 mg total) by mouth daily with breakfast. Take Xarelto for two and a half more weeks, then discontinue Xarelto. Once the patient has completed the Xarelto, they may resume the 81 mg Aspirin. 05/07/12   Laurianne Floresca Julien Girt, PA    Diet: Regular diet Activity:WBAT Follow-up:in 2 weeks Disposition - Home Discharged Condition: good   Discharge Orders    Future Orders Please Complete By Expires   Diet general      Call MD / Call 911      Comments:   If you experience chest pain or shortness of breath, CALL 911 and be transported to the hospital emergency room.  If you develope a fever above 101 F, pus (white drainage) or increased drainage or redness at the wound, or calf pain, call your surgeon's office.   Discharge instructions      Comments:   Pick up  stool softner and laxative for home. Do not submerge incision under water. May shower. Continue to use ice for pain and swelling from surgery.  Take Xarelto for two and a half more weeks, then discontinue Xarelto. Once the patient has completed the Xarelto, they may resume the 81 mg Aspirin.   Constipation Prevention      Comments:   Drink plenty of fluids.  Prune juice may be helpful.  You may use a stool softener, such as Colace (over the counter) 100 mg twice a day.  Use MiraLax (over the counter)  for constipation as needed.   Increase activity slowly as tolerated      Patient may shower      Comments:   You may shower without a dressing once there is no drainage.  Do not wash over the wound.  If drainage remains, do not shower until drainage stops.   Driving restrictions      Comments:   No driving until released by the physician.   Lifting restrictions      Comments:   No lifting until released by the physician.   TED hose      Comments:   Use stockings (TED hose) for 3 weeks on both leg(s).  You may remove them at night for sleeping.   Change dressing      Comments:   Change dressing daily with sterile 4 x 4 inch gauze dressing and apply TED hose. Do not submerge the incision under water.   Do not put a pillow under the knee. Place it under the heel.      Do not sit on low chairs, stoools or toilet seats, as it may be difficult to get up from low surfaces          Medication List     As of 05/07/2012  9:06 AM    TAKE these medications         acetaminophen 500 MG tablet   Commonly known as: TYLENOL   Take 500 mg by mouth every 6 (six) hours as needed. For pain      azithromycin 500 MG tablet   Commonly known as: ZITHROMAX   Take 1 tablet (500 mg total) by mouth daily.      azithromycin 250 MG tablet   Commonly known as: ZITHROMAX   Take as directed      furosemide 20 MG tablet   Commonly known as: LASIX   Take 20 mg by mouth Once daily as needed. For fluid        gabapentin 600 MG tablet   Commonly known as: NEURONTIN   Take 600 mg by mouth Three times a day.      metFORMIN 500 MG tablet   Commonly known as: GLUCOPHAGE   Take 500 mg by mouth Daily.      methocarbamol 500 MG tablet   Commonly known as: ROBAXIN   Take 1 tablet (500 mg total) by mouth every 6 (six) hours as needed.      omeprazole 20 MG capsule   Commonly known as: PRILOSEC   Take 20 mg by mouth daily.      oxyCODONE 5 MG immediate release tablet   Commonly known as: Oxy IR/ROXICODONE   Take 1-2 tablets (5-10 mg total) by mouth every 3 (three) hours as needed.      rivaroxaban 10 MG Tabs tablet   Commonly known as: XARELTO   Take 1 tablet (10 mg total) by mouth daily with breakfast. Take Xarelto for two and a half more weeks, then discontinue Xarelto.  Once the patient has completed the Xarelto, they may resume the 81 mg Aspirin.      SYSTANE OVERNIGHT THERAPY 0.3 % Gel   Generic drug: Hypromellose   Place 1 drop into both eyes at bedtime as needed.      TUDORZA PRESSAIR 400 MCG/ACT Aepb   Generic drug: Aclidinium Bromide   Inhale 1 puff into the lungs 2 (two) times daily.      VENTOLIN HFA 108 (90 BASE) MCG/ACT inhaler   Generic drug:  albuterol   Inhale 2 puffs into the lungs every 6 (six) hours as needed. For shortness of breath           Follow-up Information    Follow up with Loanne Drilling, MD. Schedule an appointment as soon as possible for a visit in 2 weeks.   Contact information:   [facility] [address] [place] [address] [phone]          Signed: Patrica Duel 05/07/2012, 9:06 AM

## 2012-05-07 NOTE — Progress Notes (Signed)
Utilization review completed.  

## 2012-05-07 NOTE — Progress Notes (Signed)
Physical Therapy Treatment Patient Details Name: [name] MRN: [id number] DOB: February 08, 1956 Today's Date: 05/07/2012 Time: 4098-1191 PT Time Calculation (min): 29 min  PT Assessment / Plan / Recommendation Comments on Treatment Session  Pt assisted to bathroom and then ambulated and performed step.  Pt also performed exercises and given handout.  Pt had no questions/concerns about d/c home.    Follow Up Recommendations  Home health PT    Barriers to Discharge        Equipment Recommendations  Rolling walker with 5" wheels    Recommendations for Other Services    Frequency     Plan Discharge plan remains appropriate;Frequency remains appropriate    Precautions / Restrictions Precautions Precautions: Knee Required Braces or Orthoses: Knee Immobilizer - Right Knee Immobilizer - Right: Discontinue once straight leg raise with < 10 degree lag Restrictions RLE Weight Bearing: Weight bearing as tolerated   Pertinent Vitals/Pain No pain at rest, ice applied    Mobility  Bed Mobility Bed Mobility: Supine to Sit Supine to Sit: 5: Supervision Details for Bed Mobility Assistance: increased time Transfers Transfers: Stand to Sit;Sit to Stand Sit to Stand: 4: Min guard;With upper extremity assist;From chair/3-in-1;From bed Stand to Sit: 4: Min guard;With upper extremity assist;To chair/3-in-1 Details for Transfer Assistance: verbal cues for technique Ambulation/Gait Ambulation/Gait Assistance: 4: Min guard Ambulation Distance (Feet): 140 Feet Assistive device: Rolling walker Ambulation/Gait Assistance Details: pt doing well with ambulation Gait Pattern: Step-through pattern;Decreased stance time - right;Antalgic Stairs: Yes Stairs Assistance: 4: Min guard Stairs Assistance Details (indicate cue type and reason): verbal cue for sequence and safe technique Stair Management Technique: Step to pattern;Forwards;With walker Number of Stairs: 1     Exercises Total Joint  Exercises Ankle Circles/Pumps: AROM;Both;20 reps Quad Sets: AROM;Both;20 reps Short Arc QuadBarbaraann Rodriguez;Right;20 reps Heel Slides: AAROM;Right;15 reps Hip ABduction/ADduction: AROM;15 reps;Right Straight Leg Raises: AAROM;Right;10 reps   PT Diagnosis:    PT Problem List:   PT Treatment Interventions:     PT Goals Acute Rehab PT Goals PT Goal: Supine/Side to Sit - Progress: Progressing toward goal PT Goal: Sit to Stand - Progress: Progressing toward goal PT Goal: Stand to Sit - Progress: Progressing toward goal PT Goal: Ambulate - Progress: Progressing toward goal PT Goal: Up/Down Stairs - Progress: Progressing toward goal PT Goal: Perform Home Exercise Program - Progress: Progressing toward goal  Visit Information  Last PT Received On: 05/07/12 Assistance Needed: +1    Subjective Data  Subjective: I'm ready for you.   Cognition  Overall Cognitive Status: Appears within functional limits for tasks assessed/performed    Balance     End of Session PT - End of Session Equipment Utilized During Treatment: Right knee immobilizer Activity Tolerance: Patient tolerated treatment well Patient left: in chair;with call bell/phone within reach CPM Right Knee CPM Right Knee: Off   GP     [name],[name] 05/07/2012, 12:13 PM Pager: [phone]

## 2012-05-07 NOTE — Progress Notes (Signed)
   Subjective: 2 Days Post-Op Procedure(s) (LRB): TOTAL KNEE REVISION (Right) Patient reports pain as mild.   Patient seen in rounds for Dr. Lequita Halt. Patient is well, and has had no acute complaints or problems Patient is ready to go home later today.  Objective: Vital signs in last 24 hours: Temp:  [98.6 F (37 C)-100.2 F (37.9 C)] 99.4 F (37.4 C) (09/27 0546) Pulse Rate:  [68-90] 89  (09/27 0546) Resp:  [13-16] 14  (09/27 0546) BP: (114-138)/(61-77) 138/61 mmHg (09/27 0546) SpO2:  [93 %-94 %] 93 % (09/27 0546)  Intake/Output from previous day:  Intake/Output Summary (Last 24 hours) at 05/07/12 0858 Last data filed at 05/07/12 0739  Gross per 24 hour  Intake   1020 ml  Output   3450 ml  Net  -2430 ml    Intake/Output this shift: Total I/O In: -  Out: 350 [Urine:350]  Labs:  Basename 05/07/12 0400 05/06/12 0350  HGB 10.5* 11.0*    Basename 05/07/12 0400 05/06/12 0350  WBC 12.5* 11.3*  RBC 3.59* 3.72*  HCT 32.5* 33.9*  PLT 191 206    Basename 05/07/12 0400 05/06/12 0350  NA 137 134*  K 3.6 4.1  CL 100 98  CO2 28 29  BUN 5* 7  CREATININE 0.85 0.74  GLUCOSE 155* 146*  CALCIUM 8.5 8.3*   No results found for this basename: LABPT:2,INR:2 in the last 72 hours  EXAM: General - Patient is Alert, Appropriate and Oriented Extremity - Neurovascular intact Sensation intact distally Dorsiflexion/Plantar flexion intact No cellulitis present Incision - clean, dry, no drainage, healing Motor Function - intact, moving foot and toes well on exam.   Assessment/Plan: 2 Days Post-Op Procedure(s) (LRB): TOTAL KNEE REVISION (Right) Procedure(s) (LRB): TOTAL KNEE REVISION (Right) Past Medical History  Diagnosis Date  . Multiple sclerosis   . Type 2 diabetes mellitus   . GERD (gastroesophageal reflux disease)   . COPD (chronic obstructive pulmonary disease)   . Mixed hyperlipidemia   . PONV (postoperative nausea and vomiting)   . Weakness of left side of body      DUE TO MS  . Arthritis   . Psoriasis   . Cancer     SKIN  . Neuropathy     FEET   Principal Problem:  *Failed total knee arthroplasty Active Problems:  Postop Hyponatremia   Discharge home with home health Diet - Regular diet Follow up - in 2 weeks Activity - WBAT Disposition - Home Condition Upon Discharge - Good D/C Meds - See DC Summary DVT Prophylaxis - Xarelto  PERKINS, ALEXZANDREW 05/07/2012, 8:58 AM

## 2012-05-07 NOTE — Progress Notes (Signed)
CARE MANAGEMENT NOTE 05/07/2012  Patient:  [name], [name]   Account Number:  [id number]  Date Initiated:  05/06/2012  Documentation initiated by:  Colleen Can  Subjective/Objective Assessment:   dx unstable total knee arthroplasty-right; revision total knee arthroplasty     Action/Plan:   CM spoke wuth patient. Plans are for patient to return to her home in [place], [place] [facility]) where spouse and other family members will be caregivers. Pt states she has bsc and cane. Will need RW   Anticipated DC Date:  05/08/2012   Anticipated DC Plan:  HOME W HOME HEALTH SERVICES  In-house referral  NA      DC Planning Services  CM consult      [facility] Choice  HOME HEALTH  DURABLE MEDICAL EQUIPMENT   Choice offered to / List presented to:  C-1 Patient   DME arranged  Levan Hurst      DME agency  [facility]     HH arranged  HH-2 PT      [facility] agency  [facility] Care   Status of service:  Completed, signed off Medicare Important Message given?  NA - LOS <3 / Initial given by admissions (If response is "NO", the following Medicare IM given date fields will be blank) Date Medicare IM given:   Date Additional Medicare IM given:    Discharge Disposition:  HOME W HOME HEALTH SERVICES Comments:  05/07/2012 Raynelle Bring BSN CCM [phone] [facility] Care can service. pt with HHpt. Orders, op note, face sheet, H&P faxed to karen -[phone].with confirmation-Services will start Monday 05/10/2012

## 2012-05-31 ENCOUNTER — Ambulatory Visit: Payer: Medicare Other | Attending: Orthopedic Surgery | Admitting: Physical Therapy

## 2012-05-31 DIAGNOSIS — M25569 Pain in unspecified knee: Secondary | ICD-10-CM | POA: Insufficient documentation

## 2012-05-31 DIAGNOSIS — R5381 Other malaise: Secondary | ICD-10-CM | POA: Insufficient documentation

## 2012-05-31 DIAGNOSIS — IMO0001 Reserved for inherently not codable concepts without codable children: Secondary | ICD-10-CM | POA: Insufficient documentation

## 2012-05-31 DIAGNOSIS — R262 Difficulty in walking, not elsewhere classified: Secondary | ICD-10-CM | POA: Insufficient documentation

## 2012-05-31 DIAGNOSIS — M25669 Stiffness of unspecified knee, not elsewhere classified: Secondary | ICD-10-CM | POA: Insufficient documentation

## 2012-06-02 ENCOUNTER — Ambulatory Visit: Payer: Medicare Other | Admitting: Physical Therapy

## 2012-06-04 ENCOUNTER — Ambulatory Visit: Payer: Medicare Other | Admitting: Physical Therapy

## 2012-06-07 ENCOUNTER — Ambulatory Visit: Payer: Medicare Other | Admitting: Physical Therapy

## 2012-06-08 ENCOUNTER — Ambulatory Visit: Payer: Medicare Other | Admitting: Physical Therapy

## 2012-06-11 ENCOUNTER — Ambulatory Visit: Payer: Medicare Other | Attending: Orthopedic Surgery | Admitting: Physical Therapy

## 2012-06-11 DIAGNOSIS — IMO0001 Reserved for inherently not codable concepts without codable children: Secondary | ICD-10-CM | POA: Insufficient documentation

## 2012-06-11 DIAGNOSIS — M25669 Stiffness of unspecified knee, not elsewhere classified: Secondary | ICD-10-CM | POA: Insufficient documentation

## 2012-06-11 DIAGNOSIS — R5381 Other malaise: Secondary | ICD-10-CM | POA: Insufficient documentation

## 2012-06-11 DIAGNOSIS — R262 Difficulty in walking, not elsewhere classified: Secondary | ICD-10-CM | POA: Insufficient documentation

## 2012-06-11 DIAGNOSIS — M25569 Pain in unspecified knee: Secondary | ICD-10-CM | POA: Insufficient documentation

## 2012-06-15 ENCOUNTER — Ambulatory Visit: Payer: Medicare Other | Admitting: Physical Therapy

## 2012-06-17 ENCOUNTER — Ambulatory Visit: Payer: Medicare Other | Admitting: Physical Therapy

## 2012-06-18 ENCOUNTER — Ambulatory Visit: Payer: Medicare Other | Admitting: Physical Therapy

## 2012-06-21 ENCOUNTER — Ambulatory Visit: Payer: Medicare Other | Admitting: Physical Therapy

## 2012-06-22 ENCOUNTER — Ambulatory Visit: Payer: Medicare Other | Admitting: Physical Therapy

## 2012-06-23 ENCOUNTER — Ambulatory Visit: Payer: Medicare Other | Admitting: Physical Therapy

## 2012-06-24 ENCOUNTER — Ambulatory Visit: Payer: Medicare Other | Admitting: Physical Therapy

## 2012-06-28 ENCOUNTER — Ambulatory Visit: Payer: Medicare Other | Admitting: Physical Therapy

## 2012-06-29 ENCOUNTER — Ambulatory Visit: Payer: Medicare Other | Admitting: Physical Therapy

## 2012-07-01 ENCOUNTER — Ambulatory Visit: Payer: Medicare Other | Admitting: Physical Therapy

## 2012-07-05 ENCOUNTER — Ambulatory Visit: Payer: Medicare Other | Admitting: Physical Therapy

## 2012-07-06 ENCOUNTER — Encounter: Payer: Medicare Other | Admitting: Physical Therapy

## 2012-07-07 ENCOUNTER — Ambulatory Visit: Payer: Medicare Other | Admitting: Physical Therapy

## 2012-07-12 ENCOUNTER — Ambulatory Visit: Payer: Medicare Other | Attending: Orthopedic Surgery | Admitting: Physical Therapy

## 2012-07-12 DIAGNOSIS — M25669 Stiffness of unspecified knee, not elsewhere classified: Secondary | ICD-10-CM | POA: Insufficient documentation

## 2012-07-12 DIAGNOSIS — R5381 Other malaise: Secondary | ICD-10-CM | POA: Insufficient documentation

## 2012-07-12 DIAGNOSIS — M25569 Pain in unspecified knee: Secondary | ICD-10-CM | POA: Insufficient documentation

## 2012-07-12 DIAGNOSIS — IMO0001 Reserved for inherently not codable concepts without codable children: Secondary | ICD-10-CM | POA: Insufficient documentation

## 2012-07-12 DIAGNOSIS — R262 Difficulty in walking, not elsewhere classified: Secondary | ICD-10-CM | POA: Insufficient documentation

## 2012-07-14 ENCOUNTER — Ambulatory Visit: Payer: Medicare Other | Admitting: *Deleted

## 2012-07-15 ENCOUNTER — Ambulatory Visit: Payer: Medicare Other | Admitting: Physical Therapy

## 2012-07-20 ENCOUNTER — Encounter: Payer: Medicare Other | Admitting: Physical Therapy

## 2012-07-21 ENCOUNTER — Encounter: Payer: Medicare Other | Admitting: Physical Therapy

## 2012-07-22 ENCOUNTER — Ambulatory Visit: Payer: Medicare Other | Admitting: Physical Therapy

## 2012-07-26 ENCOUNTER — Ambulatory Visit: Payer: Medicare Other | Admitting: Physical Therapy

## 2012-11-04 ENCOUNTER — Encounter: Payer: Self-pay | Admitting: *Deleted

## 2013-01-04 ENCOUNTER — Telehealth: Payer: Self-pay | Admitting: Nurse Practitioner

## 2013-01-04 DIAGNOSIS — E119 Type 2 diabetes mellitus without complications: Secondary | ICD-10-CM

## 2013-01-04 DIAGNOSIS — E782 Mixed hyperlipidemia: Secondary | ICD-10-CM

## 2013-01-04 NOTE — Telephone Encounter (Signed)
PT AWARE THAT ORDER IS IN COMPUTER and she was unable to come by Friday so I fixed order for her to come Monday she also need the rest of her labs drawn

## 2013-01-04 NOTE — Telephone Encounter (Signed)
Orders i n chart to have blood work done next [employer]

## 2013-01-04 NOTE — Addendum Note (Signed)
Addended by: Bennie Pierini on: 01/04/2013 05:24 PM   Modules accepted: Orders

## 2013-01-04 NOTE — Telephone Encounter (Signed)
Must be done by Friday Order in computer

## 2013-01-10 ENCOUNTER — Other Ambulatory Visit (INDEPENDENT_AMBULATORY_CARE_PROVIDER_SITE_OTHER): Payer: Medicare Other

## 2013-01-10 DIAGNOSIS — E119 Type 2 diabetes mellitus without complications: Secondary | ICD-10-CM

## 2013-01-10 DIAGNOSIS — E782 Mixed hyperlipidemia: Secondary | ICD-10-CM

## 2013-01-10 LAB — COMPLETE METABOLIC PANEL WITH GFR
Albumin: 3.8 g/dL (ref 3.5–5.2)
Alkaline Phosphatase: 76 U/L (ref 39–117)
BUN: 8 mg/dL (ref 6–23)
CO2: 27 mEq/L (ref 19–32)
Calcium: 9.1 mg/dL (ref 8.4–10.5)
Chloride: 106 mEq/L (ref 96–112)
GFR, Est African American: >89 mL/min
GFR, Est Non African American: 85 mL/min
Glucose, Bld: 108 mg/dL — ABNORMAL HIGH (ref 70–99)
Potassium: 4.4 mEq/L (ref 3.5–5.3)
Sodium: 141 mEq/L (ref 135–145)
Total Protein: 6.3 g/dL (ref 6.0–8.3)

## 2013-01-10 LAB — POCT GLYCOSYLATED HEMOGLOBIN (HGB A1C): Hemoglobin A1C: 6

## 2013-01-10 NOTE — Progress Notes (Signed)
Patient here today for labs only. °

## 2013-01-12 LAB — NMR LIPOPROFILE WITH LIPIDS
HDL Particle Number: 28.2 umol/L — ABNORMAL LOW (ref >=30.5)
HDL Size: 8.4 nm — ABNORMAL LOW (ref >=9.2)
HDL-C: 37 mg/dL — ABNORMAL LOW (ref >=40)
LDL Size: 20.2 nm — ABNORMAL LOW (ref >20.5)
Large HDL-P: 2.2 umol/L — ABNORMAL LOW (ref >=4.8)
Large VLDL-P: 3.6 nmol/L — ABNORMAL HIGH (ref <=2.7)

## 2013-01-17 ENCOUNTER — Ambulatory Visit (INDEPENDENT_AMBULATORY_CARE_PROVIDER_SITE_OTHER): Payer: Medicare Other | Admitting: Nurse Practitioner

## 2013-01-17 ENCOUNTER — Encounter: Payer: Self-pay | Admitting: Nurse Practitioner

## 2013-01-17 VITALS — BP 96/65 | HR 70 | Temp 97.8°F | Wt 212.0 lb

## 2013-01-17 DIAGNOSIS — E782 Mixed hyperlipidemia: Secondary | ICD-10-CM

## 2013-01-17 DIAGNOSIS — E1149 Type 2 diabetes mellitus with other diabetic neurological complication: Secondary | ICD-10-CM

## 2013-01-17 DIAGNOSIS — G35 Multiple sclerosis: Secondary | ICD-10-CM

## 2013-01-17 DIAGNOSIS — K219 Gastro-esophageal reflux disease without esophagitis: Secondary | ICD-10-CM | POA: Insufficient documentation

## 2013-01-17 DIAGNOSIS — J441 Chronic obstructive pulmonary disease with (acute) exacerbation: Secondary | ICD-10-CM

## 2013-01-17 DIAGNOSIS — J449 Chronic obstructive pulmonary disease, unspecified: Secondary | ICD-10-CM | POA: Insufficient documentation

## 2013-01-17 DIAGNOSIS — J4489 Other specified chronic obstructive pulmonary disease: Secondary | ICD-10-CM | POA: Insufficient documentation

## 2013-01-17 MED ORDER — OMEPRAZOLE 20 MG PO CPDR: 20.0000 mg | DELAYED_RELEASE_CAPSULE | Freq: Every day | ORAL | Status: DC

## 2013-01-17 MED ORDER — GABAPENTIN 600 MG PO TABS: 600.0000 mg | ORAL_TABLET | Freq: Four times a day (QID) | ORAL | Status: DC

## 2013-01-17 MED ORDER — METFORMIN HCL 500 MG PO TABS: 500.0000 mg | ORAL_TABLET | Freq: Two times a day (BID) | ORAL | Status: DC

## 2013-01-17 MED ORDER — ALBUTEROL SULFATE HFA 108 (90 BASE) MCG/ACT IN AERS: 2.0000 | INHALATION_SPRAY | Freq: Four times a day (QID) | RESPIRATORY_TRACT | Status: DC | PRN

## 2013-01-17 NOTE — Addendum Note (Signed)
Addended by: Tamera Punt on: 01/17/2013 09:37 AM   Modules accepted: Orders

## 2013-01-17 NOTE — Progress Notes (Signed)
Subjective:    Patient ID: [name], female    DOB: March 20, 1956, 57 y.o.   MRN: [id number]  Hyperlipidemia This is a chronic problem. The current episode started more than 1 year ago. The problem is uncontrolled. Recent lipid tests were reviewed and are high. Exacerbating diseases include diabetes and obesity. There are no known factors aggravating her hyperlipidemia. Pertinent negatives include no focal weakness, leg pain, myalgias or shortness of breath. She is currently on no antihyperlipidemic treatment (Statins make her hurt). The current treatment provides no improvement of lipids. There are no compliance problems.  Risk factors for coronary artery disease include diabetes mellitus, obesity and post-menopausal.  Diabetes She presents for her follow-up diabetic visit. She has type 2 diabetes mellitus. No MedicAlert identification noted. The initial diagnosis of diabetes was made 1 year ago. Her disease course has been stable. There are no hypoglycemic associated symptoms. There are no diabetic associated symptoms. There are no hypoglycemic complications. Symptoms are stable. There are no diabetic complications. Risk factors for coronary artery disease include dyslipidemia, obesity and post-menopausal. Current diabetic treatment includes oral agent (monotherapy). She is compliant with treatment all of the time. Her weight is stable. She is following a diabetic diet. When asked about meal planning, she reported none. She has not had a previous visit with a dietician. She participates in exercise every other day. There is no change in her home blood glucose trend. (Patient has never tested blood sugars.) An ACE inhibitor/angiotensin II receptor blocker is not being taken. She does not see a podiatrist.Eye exam is current (1 year ago- has appointment coming up.).  neuropathy Gabapentin helps with pain in feet but still has some pain. COPD Only uses albuterol and rarely needs to use- Patient still  smoking and said she knows that she needs to quit.   Review of Systems  Respiratory: Negative for shortness of breath.   Musculoskeletal: Negative for myalgias.  Neurological: Negative for focal weakness.  All other systems reviewed and are negative.       Objective:   Physical Exam  Constitutional: She is oriented to person, place, and time. She appears well-developed and well-nourished.  HENT:  Nose: Nose normal.  Mouth/Throat: Oropharynx is clear and moist.  Eyes: EOM are normal.  Neck: Trachea normal, normal range of motion and full passive range of motion without pain. Neck supple. No JVD present. Carotid bruit is not present. No thyromegaly present.  Cardiovascular: Normal rate, regular rhythm, normal heart sounds and intact distal pulses.  Exam reveals no gallop and no friction rub.   No murmur heard. Pulmonary/Chest: Effort normal and breath sounds normal.  Abdominal: Soft. Bowel sounds are normal. She exhibits no distension and no mass. There is no tenderness.  Musculoskeletal: Normal range of motion.  Lymphadenopathy:    She has no cervical adenopathy.  Neurological: She is alert and oriented to person, place, and time. She has normal reflexes.  Skin: Skin is warm and dry.  Psychiatric: She has a normal mood and affect. Her behavior is normal. Judgment and thought content normal.  BP 96/65  Pulse 70  Temp(Src) 97.8 F (36.6 C) (Oral)  Wt 212 lb (96.163 kg)  BMI 30.42 kg/m2  Results for orders placed in visit on 01/10/13  COMPLETE METABOLIC PANEL WITH GFR      Result Value Range   Sodium 141  135 - 145 mEq/L   Potassium 4.4  3.5 - 5.3 mEq/L   Chloride 106  96 - 112 mEq/L  CO2 27  19 - 32 mEq/L   Glucose, Bld 108 (*) 70 - 99 mg/dL   BUN 8  6 - 23 mg/dL   Creat 5.28  4.13 - 2.44 mg/dL   Total Bilirubin 0.5  0.3 - 1.2 mg/dL   Alkaline Phosphatase 76  39 - 117 U/L   AST 11  0 - 37 U/L   ALT 8  0 - 35 U/L   Total Protein 6.3  6.0 - 8.3 g/dL   Albumin 3.8  3.5  - 5.2 g/dL   Calcium 9.1  8.4 - 01.0 mg/dL   GFR, Est African American >89     GFR, Est Non African American 85    NMR LIPOPROFILE WITH LIPIDS      Result Value Range   LDL Particle Number [id number] (*) <1000 nmol/L   LDL (calc) 155 (*) <100 mg/dL   HDL-C 37 (*) >=27 mg/dL   Triglycerides 253  <664 mg/dL   Cholesterol, Total 403 (*) <200 mg/dL   HDL Particle Number [id number] (*) >=30.5 umol/L   Large HDL-P 2.2 (*) >=4.8 umol/L   Large VLDL-P 3.6 (*) <=2.7 nmol/L   Small LDL Particle Number [id number] (*) <=527 nmol/L   LDL Size 20.2 (*) >20.5 nm   HDL Size 8.4 (*) >=9.2 nm   VLDL Size 53.4 (*) <=46.6 nm   LP-IR Score 80 (*) <=45  POCT GLYCOSYLATED HEMOGLOBIN (HGB A1C)      Result Value Range   Hemoglobin A1C 6.0%            Assessment & Plan:  1. Mixed hyperlipidemia *Low fat diet and exerciwswe  2. Type II or unspecified type diabetes mellitus with neurological manifestations, not stated as uncontrolled(250.60) *Low carb diet Patient was told that she could go to 1 per day - metFORMIN (GLUCOPHAGE) 500 MG tablet; Take 1 tablet (500 mg total) by mouth 2 (two) times daily with a meal.  Dispense: 180 tablet; Refill: 1 - gabapentin (NEURONTIN) 600 MG tablet; Take 1 tablet (600 mg total) by mouth 4 (four) times daily.  Dispense: 360 tablet; Refill: 1  3. Multiple sclerosis Keep follow- up with specialist  4. GERD (gastroesophageal reflux disease) Do not eat 2 hours prior to bedtime - omeprazole (PRILOSEC) 20 MG capsule; Take 1 capsule (20 mg total) by mouth daily.  Dispense: 90 capsule; Refill: 1  5. COPD exacerbation STOP SMOKING - albuterol (VENTOLIN HFA) 108 (90 BASE) MCG/ACT inhaler; Inhale 2 puffs into the lungs every 6 (six) hours as needed. For shortness of breath  Dispense: 1 Inhaler; Refill: 1   Mary-Margaret Daphine Deutscher, FNP

## 2013-01-17 NOTE — Patient Instructions (Addendum)

## 2013-01-19 ENCOUNTER — Ambulatory Visit: Payer: Self-pay | Admitting: Family Medicine

## 2013-04-25 ENCOUNTER — Ambulatory Visit: Payer: Medicare Other | Admitting: Nurse Practitioner

## 2013-06-06 ENCOUNTER — Encounter: Payer: Self-pay | Admitting: Nurse Practitioner

## 2013-06-06 ENCOUNTER — Ambulatory Visit (INDEPENDENT_AMBULATORY_CARE_PROVIDER_SITE_OTHER): Payer: Medicare Other | Admitting: Nurse Practitioner

## 2013-06-06 VITALS — BP 123/76 | HR 69 | Temp 98.3°F | Ht 70.0 in | Wt 216.0 lb

## 2013-06-06 DIAGNOSIS — Z1212 Encounter for screening for malignant neoplasm of rectum: Secondary | ICD-10-CM

## 2013-06-06 DIAGNOSIS — Z23 Encounter for immunization: Secondary | ICD-10-CM

## 2013-06-06 DIAGNOSIS — J441 Chronic obstructive pulmonary disease with (acute) exacerbation: Secondary | ICD-10-CM

## 2013-06-06 DIAGNOSIS — K219 Gastro-esophageal reflux disease without esophagitis: Secondary | ICD-10-CM

## 2013-06-06 DIAGNOSIS — E782 Mixed hyperlipidemia: Secondary | ICD-10-CM

## 2013-06-06 DIAGNOSIS — E1149 Type 2 diabetes mellitus with other diabetic neurological complication: Secondary | ICD-10-CM

## 2013-06-06 DIAGNOSIS — G35 Multiple sclerosis: Secondary | ICD-10-CM

## 2013-06-06 LAB — POCT GLYCOSYLATED HEMOGLOBIN (HGB A1C): Hemoglobin A1C: 5.8

## 2013-06-06 NOTE — Patient Instructions (Signed)

## 2013-06-06 NOTE — Progress Notes (Signed)
Subjective:    Patient ID: [name], female    DOB: Feb 18, 1956, 57 y.o.   MRN: [id number]  Hyperlipidemia This is a chronic problem. The current episode started more than 1 year ago. The problem is uncontrolled. Recent lipid tests were reviewed and are normal. Exacerbating diseases include diabetes. She has no history of liver disease or nephrotic syndrome. There are no known factors aggravating her hyperlipidemia. Pertinent negatives include no focal sensory loss, focal weakness, myalgias or shortness of breath. She is currently on no antihyperlipidemic treatment. The current treatment provides no improvement of lipids. Risk factors for coronary artery disease include diabetes mellitus, family history and post-menopausal.  Diabetes She presents for her follow-up diabetic visit. She has type 2 diabetes mellitus. No MedicAlert identification noted. The initial diagnosis of diabetes was made 1 year ago. Her disease course has been stable. There are no hypoglycemic associated symptoms. Pertinent negatives for diabetes include no blurred vision, no foot paresthesias, no polydipsia, no polyphagia, no polyuria, no visual change and no weakness. There are no hypoglycemic complications. Symptoms are stable. There are no diabetic complications. Risk factors for coronary artery disease include dyslipidemia, family history and post-menopausal. Current diabetic treatment includes oral agent (monotherapy) and diet. She is compliant with treatment most of the time. Her weight is stable. She is following a generally healthy diet. When asked about meal planning, she reported none. She has not had a previous visit with a dietician. She rarely participates in exercise. (Patient doesn't check blood sugars at home) An ACE inhibitor/angiotensin II receptor blocker is not being taken. She does not see a podiatrist.Eye exam is not current.  MS Sees Dr Daphane Shepherd in [place] every 6 months- currently under  control GERD omerprazole daily- keeps symptom sunder control bil foot neuropathy Gabapentin helps with bruning senastion- no c/ side effects COPD  No maintenance inhaler- only ues albuterol as needed- hasn't needed in about 2 months Review of Systems  Eyes: Negative for blurred vision.  Respiratory: Negative for shortness of breath.   Endocrine: Negative for polydipsia, polyphagia and polyuria.  Musculoskeletal: Negative for myalgias.  Neurological: Negative for focal weakness and weakness.       Objective:   Physical Exam  Constitutional: She is oriented to person, place, and time. She appears well-developed and well-nourished.  HENT:  Nose: Nose normal.  Mouth/Throat: Oropharynx is clear and moist.  Eyes: EOM are normal.  Neck: Trachea normal, normal range of motion and full passive range of motion without pain. Neck supple. No JVD present. Carotid bruit is not present. No thyromegaly present.  Cardiovascular: Normal rate, regular rhythm, normal heart sounds and intact distal pulses.  Exam reveals no gallop and no friction rub.   No murmur heard. Pulmonary/Chest: Effort normal and breath sounds normal.  Abdominal: Soft. Bowel sounds are normal. She exhibits no distension and no mass. There is no tenderness.  Musculoskeletal: Normal range of motion.  Lymphadenopathy:    She has no cervical adenopathy.  Neurological: She is alert and oriented to person, place, and time. She has normal reflexes.  Skin: Skin is warm and dry.  Psychiatric: She has a normal mood and affect. Her behavior is normal. Judgment and thought content normal.    BP 123/76  Pulse 69  Temp(Src) 98.3 F (36.8 C) (Oral)  Ht 5\' 10"  (1.778 m)  Wt 216 lb (97.977 kg)  BMI 30.99 kg/m2 Results for orders placed in visit on 06/06/13  POCT GLYCOSYLATED HEMOGLOBIN (HGB A1C)  Result Value Range   Hemoglobin A1C 5.8           Assessment & Plan:   1. COPD exacerbation   2. Mixed hyperlipidemia   3.  GERD (gastroesophageal reflux disease)   4. Type II or unspecified type diabetes mellitus with neurological manifestations, not stated as uncontrolled(250.60)   5. Multiple sclerosis    Orders Placed This Encounter  Procedures  . CMP14+EGFR  . Lipid panel  . POCT glycosylated hemoglobin (Hb A1C)   Meds ordered this encounter  Medications  . metFORMIN (GLUCOPHAGE) 500 MG tablet    Sig: Take 500 mg by mouth daily with breakfast.  . loratadine (CLARITIN) 10 MG tablet    Sig: Take 10 mg by mouth daily.  . Probiotic Product (ALIGN PO)    Sig: Take 1 tablet by mouth daily.    Continue all meds Labs pending Diet and exercise encouraged Health maintenance reviewed Follow up in 3 months Flu shot today hemocult cards given to patient  Mary-Margaret Daphine Deutscher, FNP

## 2013-06-07 ENCOUNTER — Encounter: Payer: Self-pay | Admitting: Nurse Practitioner

## 2013-06-07 LAB — LIPID PANEL
Chol/HDL Ratio: 5.5 ratio units — ABNORMAL HIGH (ref 0.0–4.4)
HDL: 44 mg/dL (ref >39)
VLDL Cholesterol Cal: 33 mg/dL (ref 5–40)

## 2013-06-07 LAB — CMP14+EGFR
AST: 20 IU/L (ref 0–40)
Albumin/Globulin Ratio: 2.2 (ref 1.1–2.5)
Calcium: 9.1 mg/dL (ref 8.7–10.2)
Creatinine, Ser: 0.63 mg/dL (ref 0.57–1.00)
GFR calc Af Amer: 118 mL/min/{1.73_m2} (ref >59)
GFR calc non Af Amer: 102 mL/min/{1.73_m2} (ref >59)
Globulin, Total: 1.8 g/dL (ref 1.5–4.5)
Sodium: 143 mmol/L (ref 134–144)
Total Bilirubin: 0.4 mg/dL (ref 0.0–1.2)
Total Protein: 5.7 g/dL — ABNORMAL LOW (ref 6.0–8.5)

## 2013-06-07 NOTE — Addendum Note (Signed)
Addended by: Orma Render F on: 06/07/2013 02:44 PM   Modules accepted: Orders

## 2013-06-10 LAB — FECAL OCCULT BLOOD, IMMUNOCHEMICAL: Fecal Occult Bld: NEGATIVE

## 2013-06-16 ENCOUNTER — Encounter: Payer: Self-pay | Admitting: Nurse Practitioner

## 2013-08-19 ENCOUNTER — Encounter: Payer: Self-pay | Admitting: Physician Assistant

## 2013-08-19 ENCOUNTER — Ambulatory Visit (INDEPENDENT_AMBULATORY_CARE_PROVIDER_SITE_OTHER): Payer: Medicare Other | Admitting: Physician Assistant

## 2013-08-19 ENCOUNTER — Ambulatory Visit (INDEPENDENT_AMBULATORY_CARE_PROVIDER_SITE_OTHER): Payer: Medicare Other

## 2013-08-19 VITALS — BP 125/70 | HR 79 | Temp 99.4°F | Ht 70.0 in | Wt 217.8 lb

## 2013-08-19 DIAGNOSIS — R05 Cough: Secondary | ICD-10-CM

## 2013-08-19 DIAGNOSIS — R059 Cough, unspecified: Secondary | ICD-10-CM

## 2013-08-19 MED ORDER — PREDNISONE (PAK) 10 MG PO TABS: ORAL_TABLET | ORAL | Status: DC

## 2013-08-19 MED ORDER — MAGIC MOUTHWASH: 5.0000 mL | Freq: Three times a day (TID) | ORAL | Status: DC | PRN

## 2013-08-19 MED ORDER — SULFAMETHOXAZOLE-TMP DS 800-160 MG PO TABS: 1.0000 | ORAL_TABLET | Freq: Two times a day (BID) | ORAL | Status: DC

## 2013-08-19 NOTE — Patient Instructions (Signed)

## 2013-08-30 NOTE — Progress Notes (Signed)
   Subjective:    Patient ID: [name], female    DOB: 12/21/1955, 58 y.o.   MRN: [id number]  HPI 58 y/o female presents w/ c/o worsening cough and congestion x 1 wk    Review of Systems  Constitutional: Positive for fever (low grade) and fatigue.  HENT: Positive for congestion. Negative for postnasal drip, rhinorrhea and sinus pressure.   Respiratory: Positive for cough (productive , worse at night ), chest tightness, shortness of breath and wheezing. Negative for apnea and choking.   All other systems reviewed and are negative.       Objective:   Physical Exam  Nursing note and vitals reviewed. Constitutional: She appears well-developed and well-nourished. No distress.  Cardiovascular: Normal rate and regular rhythm.  Exam reveals no gallop and no friction rub.   No murmur heard. Pulmonary/Chest: No respiratory distress. She has wheezes. She exhibits no tenderness.  Skin: She is not diaphoretic.          Assessment & Plan:  1. COPD exacerbation w/ comorbid DM and MS: Prescribed Bactrim DS BID x 10 days, Prednisone dose pack. Advised patient to use prescribed inhalers on a regular basis. Patient requested magic mouth wash because she states that when using inhalers on regular basis she suffers from thrush. Patient should f/u in 2 wks for reassessment.

## 2013-09-06 ENCOUNTER — Ambulatory Visit: Payer: Medicare Other | Admitting: Nurse Practitioner

## 2013-09-12 ENCOUNTER — Encounter: Payer: Self-pay | Admitting: Nurse Practitioner

## 2013-09-12 ENCOUNTER — Ambulatory Visit (INDEPENDENT_AMBULATORY_CARE_PROVIDER_SITE_OTHER): Payer: Medicare Other | Admitting: Nurse Practitioner

## 2013-09-12 VITALS — BP 121/67 | HR 67 | Temp 98.6°F | Ht 70.0 in | Wt 216.0 lb

## 2013-09-12 DIAGNOSIS — E782 Mixed hyperlipidemia: Secondary | ICD-10-CM

## 2013-09-12 DIAGNOSIS — J441 Chronic obstructive pulmonary disease with (acute) exacerbation: Secondary | ICD-10-CM

## 2013-09-12 DIAGNOSIS — Z23 Encounter for immunization: Secondary | ICD-10-CM

## 2013-09-12 DIAGNOSIS — K219 Gastro-esophageal reflux disease without esophagitis: Secondary | ICD-10-CM

## 2013-09-12 DIAGNOSIS — E1149 Type 2 diabetes mellitus with other diabetic neurological complication: Secondary | ICD-10-CM

## 2013-09-12 LAB — POCT GLYCOSYLATED HEMOGLOBIN (HGB A1C): Hemoglobin A1C: 5.9

## 2013-09-12 MED ORDER — GABAPENTIN 600 MG PO TABS: 600.0000 mg | ORAL_TABLET | Freq: Four times a day (QID) | ORAL | Status: DC

## 2013-09-12 MED ORDER — ALBUTEROL SULFATE HFA 108 (90 BASE) MCG/ACT IN AERS: 2.0000 | INHALATION_SPRAY | Freq: Four times a day (QID) | RESPIRATORY_TRACT | Status: DC | PRN

## 2013-09-12 MED ORDER — MELATONIN 5 MG PO CAPS: 10.0000 mg | ORAL_CAPSULE | Freq: Every day | ORAL | Status: DC

## 2013-09-12 MED ORDER — METFORMIN HCL 500 MG PO TABS: 500.0000 mg | ORAL_TABLET | Freq: Every day | ORAL | Status: DC

## 2013-09-12 MED ORDER — FUROSEMIDE 20 MG PO TABS: 20.0000 mg | ORAL_TABLET | Freq: Every day | ORAL | Status: DC

## 2013-09-12 MED ORDER — OMEPRAZOLE 20 MG PO CPDR: 20.0000 mg | DELAYED_RELEASE_CAPSULE | Freq: Every day | ORAL | Status: DC

## 2013-09-12 NOTE — Addendum Note (Signed)
Addended by: Thana Ates on: 09/12/2013 09:16 AM   Modules accepted: Orders

## 2013-09-12 NOTE — Progress Notes (Signed)
Subjective:    Patient ID: [name], female    DOB: 1956/04/15, 58 y.o.   MRN: [id number]  Patient here today for follow up- No changes since last visit. SHe does c/o some fluid retention over the last several months.  Hyperlipidemia This is a chronic problem. The current episode started more than 1 year ago. The problem is uncontrolled. Recent lipid tests were reviewed and are high. Exacerbating diseases include diabetes and obesity. There are no known factors aggravating her hyperlipidemia. Pertinent negatives include no focal weakness, leg pain, myalgias or shortness of breath. She is currently on no antihyperlipidemic treatment (Statins make her hurt). The current treatment provides no improvement of lipids. There are no compliance problems.  Risk factors for coronary artery disease include diabetes mellitus, obesity and post-menopausal.  Diabetes She presents for her follow-up diabetic visit. She has type 2 diabetes mellitus. No MedicAlert identification noted. The initial diagnosis of diabetes was made 1 year ago. Her disease course has been stable. There are no hypoglycemic associated symptoms. There are no diabetic associated symptoms. There are no hypoglycemic complications. Symptoms are stable. There are no diabetic complications. Risk factors for coronary artery disease include dyslipidemia, obesity and post-menopausal. Current diabetic treatment includes oral agent (monotherapy). She is compliant with treatment all of the time. Her weight is stable. She is following a diabetic diet. When asked about meal planning, she reported none. She has not had a previous visit with a dietician. She participates in exercise every other day. There is no change in her home blood glucose trend. (Patient has never tested blood sugars.) An ACE inhibitor/angiotensin II receptor blocker is not being taken. She does not see a podiatrist.Eye exam is current (1 year ago- has appointment coming up.).   neuropathy Gabapentin helps with pain in feet but still has some pain. COPD Only uses albuterol and rarely needs to use- Patient still smoking and said she knows that she needs to quit. Hasn't needed albuterol over the last 2 months.   Review of Systems  Respiratory: Negative for shortness of breath.   Musculoskeletal: Negative for myalgias.  Neurological: Negative for focal weakness.  All other systems reviewed and are negative.       Objective:   Physical Exam  Constitutional: She is oriented to person, place, and time. She appears well-developed and well-nourished.  HENT:  Nose: Nose normal.  Mouth/Throat: Oropharynx is clear and moist.  Eyes: EOM are normal.  Neck: Trachea normal, normal range of motion and full passive range of motion without pain. Neck supple. No JVD present. Carotid bruit is not present. No thyromegaly present.  Cardiovascular: Normal rate, regular rhythm, normal heart sounds and intact distal pulses.  Exam reveals no gallop and no friction rub.   No murmur heard. Pulmonary/Chest: Effort normal and breath sounds normal.  Abdominal: Soft. Bowel sounds are normal. She exhibits no distension and no mass. There is no tenderness.  Musculoskeletal: Normal range of motion.  Lymphadenopathy:    She has no cervical adenopathy.  Neurological: She is alert and oriented to person, place, and time. She has normal reflexes.  Skin: Skin is warm and dry.  Psychiatric: She has a normal mood and affect. Her behavior is normal. Judgment and thought content normal.    BP 121/67  Pulse 67  Temp(Src) 98.6 F (37 C) (Oral)  Ht $R'5\' 10"'XX$  (1.778 m)  Wt 216 lb (97.977 kg)  BMI 30.99 kg/m2 Results for orders placed in visit on 09/12/13  POCT GLYCOSYLATED HEMOGLOBIN (  HGB A1C)      Result Value Range   Hemoglobin A1C 5.9%          Assessment & Plan:   1. Type II or unspecified type diabetes mellitus with neurological manifestations, not stated as uncontrolled(250.60)    2. Mixed hyperlipidemia   3. COPD exacerbation   4. GERD (gastroesophageal reflux disease)   5. Type II or unspecified type diabetes mellitus with neurological manifestations, not stated as uncontrolled    Orders Placed This Encounter  Procedures  . CMP14+EGFR  . NMR, lipoprofile  . POCT glycosylated hemoglobin (Hb A1C)   Meds ordered this encounter  Medications  . Melatonin 5 MG CAPS    Sig: Take 2 capsules (10 mg total) by mouth daily.    Dispense:  90 capsule    Refill:  1    Order Specific Question:  Supervising Provider    Answer:  Chipper Herb [[id number]]  . albuterol (VENTOLIN HFA) 108 (90 BASE) MCG/ACT inhaler    Sig: Inhale 2 puffs into the lungs every 6 (six) hours as needed. For shortness of breath    Dispense:  1 Inhaler    Refill:  1    Order Specific Question:  Supervising Provider    Answer:  Chipper Herb [[id number]]  . omeprazole (PRILOSEC) 20 MG capsule    Sig: Take 1 capsule (20 mg total) by mouth daily.    Dispense:  90 capsule    Refill:  1    Order Specific Question:  Supervising Provider    Answer:  Chipper Herb [[id number]]  . metFORMIN (GLUCOPHAGE) 500 MG tablet    Sig: Take 1 tablet (500 mg total) by mouth daily with breakfast.    Dispense:  90 tablet    Refill:  1    Order Specific Question:  Supervising Provider    Answer:  Chipper Herb [[id number]]  . gabapentin (NEURONTIN) 600 MG tablet    Sig: Take 1 tablet (600 mg total) by mouth 4 (four) times daily.    Dispense:  360 tablet    Refill:  1    Order Specific Question:  Supervising Provider    Answer:  Chipper Herb [[id number]]  . furosemide (LASIX) 20 MG tablet    Sig: Take 1 tablet (20 mg total) by mouth daily.    Dispense:  30 tablet    Refill:  3    Order Specific Question:  Supervising Provider    Answer:  Chipper Herb [[id number]]    Labs pending Health maintenance reviewed Diet and exercise encouraged Continue all meds Follow up  In 3 months   [place], FNP

## 2013-09-12 NOTE — Patient Instructions (Signed)

## 2013-09-13 LAB — CMP14+EGFR
A/G RATIO: 2 (ref 1.1–2.5)
ALBUMIN: 4 g/dL (ref 3.5–5.5)
ALT: 8 IU/L (ref 0–32)
AST: 13 IU/L (ref 0–40)
Alkaline Phosphatase: 73 IU/L (ref 39–117)
BILIRUBIN TOTAL: 0.4 mg/dL (ref 0.0–1.2)
BUN/Creatinine Ratio: 12 (ref 9–23)
BUN: 10 mg/dL (ref 6–24)
CO2: 27 mmol/L (ref 18–29)
CREATININE: 0.81 mg/dL (ref 0.57–1.00)
Calcium: 9.1 mg/dL (ref 8.7–10.2)
Chloride: 103 mmol/L (ref 97–108)
GFR, EST AFRICAN AMERICAN: 95 mL/min/{1.73_m2} (ref >59)
GFR, EST NON AFRICAN AMERICAN: 82 mL/min/{1.73_m2} (ref >59)
GLOBULIN, TOTAL: 2 g/dL (ref 1.5–4.5)
GLUCOSE: 107 mg/dL — AB (ref 65–99)
Potassium: 4.6 mmol/L (ref 3.5–5.2)
Sodium: 143 mmol/L (ref 134–144)
TOTAL PROTEIN: 6 g/dL (ref 6.0–8.5)

## 2013-09-13 LAB — NMR, LIPOPROFILE
CHOLESTEROL: 239 mg/dL — AB (ref <200)
HDL CHOLESTEROL BY NMR: 41 mg/dL (ref >=40)
HDL Particle Number: 31.4 umol/L (ref >=30.5)
LDL Particle Number: 2316 nmol/L — ABNORMAL HIGH (ref <1000)
LDL SIZE: 20.3 nm — AB (ref >20.5)
LDLC SERPL CALC-MCNC: 156 mg/dL — ABNORMAL HIGH (ref <100)
LP-IR Score: 76 — ABNORMAL HIGH (ref <=45)
SMALL LDL PARTICLE NUMBER: 1171 nmol/L — AB (ref <=527)
TRIGLYCERIDES BY NMR: 212 mg/dL — AB (ref <150)

## 2013-11-07 ENCOUNTER — Ambulatory Visit (INDEPENDENT_AMBULATORY_CARE_PROVIDER_SITE_OTHER): Payer: Medicare Other | Admitting: Family Medicine

## 2013-11-07 ENCOUNTER — Encounter: Payer: Self-pay | Admitting: Family Medicine

## 2013-11-07 VITALS — BP 108/61 | HR 75 | Temp 98.5°F | Ht 70.0 in | Wt 218.6 lb

## 2013-11-07 DIAGNOSIS — J209 Acute bronchitis, unspecified: Secondary | ICD-10-CM

## 2013-11-07 MED ORDER — PREDNISONE 10 MG PO TABS: ORAL_TABLET | ORAL | Status: DC

## 2013-11-07 MED ORDER — LEVOFLOXACIN 500 MG PO TABS: 500.0000 mg | ORAL_TABLET | Freq: Every day | ORAL | Status: DC

## 2013-11-07 NOTE — Progress Notes (Signed)
   Subjective:    Patient ID: [name], female    DOB: 1956/07/15, 59 y.o.   MRN: [id number]  HPI This 58 y.o. female presents for evaluation of cough and congestion for 2 days.  She has Been using her nebulizer prn.  She has been coughing and she has been wheezing.  Review of Systems C/o cough and uri sx's.   No chest pain, SOB, HA, dizziness, vision change, N/V, diarrhea, constipation, dysuria, urinary urgency or frequency, myalgias, arthralgias or rash.  Objective:   Physical Exam   Vital signs noted  Well developed well nourished female.  HEENT - Head atraumatic Normocephalic                Eyes - PERRLA, Conjuctiva - clear Sclera- Clear EOMI                Ears - EAC's Wnl TM's Wnl Gross Hearing WNL                Throat - oropharanx wnl Respiratory - Lungs with expiratory wheezes bilateral Cardiac - RRR S1 and S2 without murmur GI - Abdomen soft Nontender and bowel sounds active x 4 Extremities - No edema. Neuro - Grossly intact.     Assessment & Plan:  Acute bronchitis - Plan: levofloxacin (LEVAQUIN) 500 MG tablet, predniSONE (DELTASONE) 10 MG tablet Push po fluids, rest, tylenol and motrin otc prn as directed for fever, arthralgias, and myalgias.  Follow up prn if sx's continue or persist.  Lysbeth Penner FNP

## 2013-12-12 ENCOUNTER — Ambulatory Visit (INDEPENDENT_AMBULATORY_CARE_PROVIDER_SITE_OTHER): Payer: Medicare Other | Admitting: Nurse Practitioner

## 2013-12-12 ENCOUNTER — Encounter: Payer: Self-pay | Admitting: Nurse Practitioner

## 2013-12-12 VITALS — BP 119/61 | HR 66 | Temp 98.0°F | Ht 70.0 in | Wt 217.0 lb

## 2013-12-12 DIAGNOSIS — E782 Mixed hyperlipidemia: Secondary | ICD-10-CM

## 2013-12-12 DIAGNOSIS — E1149 Type 2 diabetes mellitus with other diabetic neurological complication: Secondary | ICD-10-CM

## 2013-12-12 DIAGNOSIS — K219 Gastro-esophageal reflux disease without esophagitis: Secondary | ICD-10-CM

## 2013-12-12 DIAGNOSIS — J441 Chronic obstructive pulmonary disease with (acute) exacerbation: Secondary | ICD-10-CM

## 2013-12-12 DIAGNOSIS — E1140 Type 2 diabetes mellitus with diabetic neuropathy, unspecified: Secondary | ICD-10-CM

## 2013-12-12 DIAGNOSIS — Z720 Tobacco use: Secondary | ICD-10-CM

## 2013-12-12 DIAGNOSIS — G35 Multiple sclerosis: Secondary | ICD-10-CM

## 2013-12-12 DIAGNOSIS — E1142 Type 2 diabetes mellitus with diabetic polyneuropathy: Secondary | ICD-10-CM

## 2013-12-12 DIAGNOSIS — F17200 Nicotine dependence, unspecified, uncomplicated: Secondary | ICD-10-CM

## 2013-12-12 LAB — POCT GLYCOSYLATED HEMOGLOBIN (HGB A1C): Hemoglobin A1C: 6.1

## 2013-12-12 MED ORDER — METFORMIN HCL 500 MG PO TABS: 500.0000 mg | ORAL_TABLET | Freq: Every day | ORAL | Status: DC

## 2013-12-12 MED ORDER — OMEPRAZOLE 20 MG PO CPDR: 20.0000 mg | DELAYED_RELEASE_CAPSULE | Freq: Every day | ORAL | Status: DC

## 2013-12-12 MED ORDER — ALBUTEROL SULFATE 1.25 MG/3ML IN NEBU: 1.0000 | INHALATION_SOLUTION | Freq: Four times a day (QID) | RESPIRATORY_TRACT | Status: DC | PRN

## 2013-12-12 MED ORDER — GABAPENTIN 600 MG PO TABS: 600.0000 mg | ORAL_TABLET | Freq: Four times a day (QID) | ORAL | Status: DC

## 2013-12-12 NOTE — Patient Instructions (Signed)
Diabetes and Foot Care Diabetes may cause you to have problems because of poor blood supply (circulation) to your feet and legs. This may cause the skin on your feet to become thinner, break easier, and heal more slowly. Your skin may become dry, and the skin may peel and crack. You may also have nerve damage in your legs and feet causing decreased feeling in them. You may not notice minor injuries to your feet that could lead to infections or more serious problems. Taking care of your feet is one of the most important things you can do for yourself.  HOME CARE INSTRUCTIONS  Wear shoes at all times, even in the house. Do not go barefoot. Bare feet are easily injured.  Check your feet daily for blisters, cuts, and redness. If you cannot see the bottom of your feet, use a mirror or ask someone for help.  Wash your feet with warm water (do not use hot water) and mild soap. Then pat your feet and the areas between your toes until they are completely dry. Do not soak your feet as this can dry your skin.  Apply a moisturizing lotion or petroleum jelly (that does not contain alcohol and is unscented) to the skin on your feet and to dry, brittle toenails. Do not apply lotion between your toes.  Trim your toenails straight across. Do not dig under them or around the cuticle. File the edges of your nails with an emery board or nail file.  Do not cut corns or calluses or try to remove them with medicine.  Wear clean socks or stockings every day. Make sure they are not too tight. Do not wear knee-high stockings since they may decrease blood flow to your legs.  Wear shoes that fit properly and have enough cushioning. To break in new shoes, wear them for just a few hours a day. This prevents you from injuring your feet. Always look in your shoes before you put them on to be sure there are no objects inside.  Do not cross your legs. This may decrease the blood flow to your feet.  If you find a minor scrape,  cut, or break in the skin on your feet, keep it and the skin around it clean and dry. These areas may be cleansed with mild soap and water. Do not cleanse the area with peroxide, alcohol, or iodine.  When you remove an adhesive bandage, be sure not to damage the skin around it.  If you have a wound, look at it several times a day to make sure it is healing.  Do not use heating pads or hot water bottles. They may burn your skin. If you have lost feeling in your feet or legs, you may not know it is happening until it is too late.  Make sure your health care provider performs a complete foot exam at least annually or more often if you have foot problems. Report any cuts, sores, or bruises to your health care provider immediately. SEEK MEDICAL CARE IF:   You have an injury that is not healing.  You have cuts or breaks in the skin.  You have an ingrown nail.  You notice redness on your legs or feet.  You feel burning or tingling in your legs or feet.  You have pain or cramps in your legs and feet.  Your legs or feet are numb.  Your feet always feel cold. SEEK IMMEDIATE MEDICAL CARE IF:   There is increasing redness,   swelling, or pain in or around a wound.  There is a red line that goes up your leg.  Pus is coming from a wound.  You develop a fever or as directed by your health care provider.  You notice a bad smell coming from an ulcer or wound. Document Released: 07/25/2000 Document Revised: 03/30/2013 Document Reviewed: 01/04/2013 [employer] Patient Information 2014 [employer].  

## 2013-12-12 NOTE — Progress Notes (Signed)
Subjective:    Patient ID: [name], female    DOB: 08/29/1955, 58 y.o.   MRN: [id number]  Patient here today for follow up of chronic medical problems.  Hyperlipidemia This is a chronic problem. The current episode started more than 1 year ago. The problem is uncontrolled. Recent lipid tests were reviewed and are normal. Exacerbating diseases include diabetes. She has no history of liver disease or nephrotic syndrome. There are no known factors aggravating her hyperlipidemia. Pertinent negatives include no focal sensory loss, focal weakness, myalgias or shortness of breath. She is currently on no antihyperlipidemic treatment. The current treatment provides no improvement of lipids. Risk factors for coronary artery disease include diabetes mellitus, family history and post-menopausal.  Diabetes She presents for her follow-up diabetic visit. She has type 2 diabetes mellitus. No MedicAlert identification noted. The initial diagnosis of diabetes was made 1 year ago. Her disease course has been stable. There are no hypoglycemic associated symptoms. Pertinent negatives for diabetes include no blurred vision, no foot paresthesias, no polydipsia, no polyphagia, no polyuria, no visual change and no weakness. There are no hypoglycemic complications. Symptoms are stable. There are no diabetic complications. Risk factors for coronary artery disease include dyslipidemia, family history and post-menopausal. Current diabetic treatment includes oral agent (monotherapy) and diet. She is compliant with treatment most of the time. Her weight is stable. She is following a generally healthy diet. When asked about meal planning, she reported none. She has not had a previous visit with a dietician. She rarely participates in exercise. (Patient doesn't check blood sugars at home) An ACE inhibitor/angiotensin II receptor blocker is not being taken. She does not see a podiatrist.Eye exam is not current.  MS Sees Dr Bjorn Loser  in [place] every 6 months- currently under control GERD omerprazole daily- keeps symptom sunder control bil foot neuropathy Gabapentin helps with bruning senastion- no c/ side effects COPD  No maintenance inhaler- only ues albuterol as needed- hasn't needed in about 2 months Review of Systems  Eyes: Negative for blurred vision.  Respiratory: Negative for shortness of breath.   Endocrine: Negative for polydipsia, polyphagia and polyuria.  Musculoskeletal: Negative for myalgias.  Neurological: Negative for focal weakness and weakness.       Objective:   Physical Exam  Constitutional: She is oriented to person, place, and time. She appears well-developed and well-nourished.  HENT:  Nose: Nose normal.  Mouth/Throat: Oropharynx is clear and moist.  Eyes: EOM are normal.  Neck: Trachea normal, normal range of motion and full passive range of motion without pain. Neck supple. No JVD present. Carotid bruit is not present. No thyromegaly present.  Cardiovascular: Normal rate, regular rhythm, normal heart sounds and intact distal pulses.  Exam reveals no gallop and no friction rub.   No murmur heard. Pulmonary/Chest: Effort normal and breath sounds normal.  Abdominal: Soft. Bowel sounds are normal. She exhibits no distension and no mass. There is no tenderness.  Musculoskeletal: Normal range of motion.  Lymphadenopathy:    She has no cervical adenopathy.  Neurological: She is alert and oriented to person, place, and time. She has normal reflexes.  Skin: Skin is warm and dry.  Psychiatric: She has a normal mood and affect. Her behavior is normal. Judgment and thought content normal.    BP 119/61  Pulse 66  Temp(Src) 98 F (36.7 C) (Oral)  Ht 5' 10" (1.778 m)  Wt 217 lb (98.431 kg)  BMI 31.14 kg/m2 Results for orders placed in visit on 12/12/13  POCT GLYCOSYLATED HEMOGLOBIN (HGB A1C)      Result Value Ref Range   Hemoglobin A1C 6.1%           Assessment & Plan:   1. Mixed  hyperlipidemia   2. Type II or unspecified type diabetes mellitus with neurological manifestations, not stated as uncontrolled(250.60)   3. Multiple sclerosis   4. GERD (gastroesophageal reflux disease)   5. COPD exacerbation   6. Tobacco abuse   7. Diabetic neuropathy   8. Type II or unspecified type diabetes mellitus with neurological manifestations, not stated as uncontrolled    Orders Placed This Encounter  Procedures  . CMP14+EGFR  . NMR, lipoprofile  . POCT glycosylated hemoglobin (Hb A1C)   Meds ordered this encounter  Medications  . albuterol (ACCUNEB) 1.25 MG/3ML nebulizer solution    Sig: Take 3 mLs (1.25 mg total) by nebulization every 6 (six) hours as needed for wheezing.    Dispense:  75 mL    Refill:  12    Order Specific Question:  Supervising Provider    Answer:  Chipper Herb [[id number]]  . omeprazole (PRILOSEC) 20 MG capsule    Sig: Take 1 capsule (20 mg total) by mouth daily.    Dispense:  90 capsule    Refill:  1    Order Specific Question:  Supervising Provider    Answer:  Chipper Herb [[id number]]  . gabapentin (NEURONTIN) 600 MG tablet    Sig: Take 1 tablet (600 mg total) by mouth 4 (four) times daily.    Dispense:  360 tablet    Refill:  1    Order Specific Question:  Supervising Provider    Answer:  Chipper Herb [[id number]]  . metFORMIN (GLUCOPHAGE) 500 MG tablet    Sig: Take 1 tablet (500 mg total) by mouth daily with breakfast.    Dispense:  90 tablet    Refill:  1    Order Specific Question:  Supervising Provider    Answer:  Joycelyn Man   Refuses mammo and colonoscopy Labs pending Health maintenance reviewed Diet and exercise encouraged Continue all meds Follow up  In 3 months   [place], FNP

## 2013-12-13 LAB — CMP14+EGFR
A/G RATIO: 2.3 (ref 1.1–2.5)
ALBUMIN: 4.3 g/dL (ref 3.5–5.5)
ALK PHOS: 73 IU/L (ref 39–117)
ALT: 10 IU/L (ref 0–32)
AST: 12 IU/L (ref 0–40)
BILIRUBIN TOTAL: 0.4 mg/dL (ref 0.0–1.2)
BUN / CREAT RATIO: 16 (ref 9–23)
BUN: 12 mg/dL (ref 6–24)
CHLORIDE: 101 mmol/L (ref 97–108)
CO2: 28 mmol/L (ref 18–29)
Calcium: 9.2 mg/dL (ref 8.7–10.2)
Creatinine, Ser: 0.74 mg/dL (ref 0.57–1.00)
GFR, EST AFRICAN AMERICAN: 105 mL/min/{1.73_m2} (ref >59)
GFR, EST NON AFRICAN AMERICAN: 91 mL/min/{1.73_m2} (ref >59)
Globulin, Total: 1.9 g/dL (ref 1.5–4.5)
Glucose: 113 mg/dL — ABNORMAL HIGH (ref 65–99)
POTASSIUM: 4.5 mmol/L (ref 3.5–5.2)
Sodium: 141 mmol/L (ref 134–144)
Total Protein: 6.2 g/dL (ref 6.0–8.5)

## 2013-12-13 LAB — NMR, LIPOPROFILE
CHOLESTEROL: 223 mg/dL — AB (ref <200)
HDL Cholesterol by NMR: 42 mg/dL (ref >=40)
HDL Particle Number: 31.7 umol/L (ref >=30.5)
LDL PARTICLE NUMBER: 2220 nmol/L — AB (ref <1000)
LDL SIZE: 20.5 nm (ref >20.5)
LDLC SERPL CALC-MCNC: 150 mg/dL — ABNORMAL HIGH (ref <100)
LP-IR SCORE: 80 — AB (ref <=45)
SMALL LDL PARTICLE NUMBER: 1180 nmol/L — AB (ref <=527)
TRIGLYCERIDES BY NMR: 155 mg/dL — AB (ref <150)

## 2014-02-20 ENCOUNTER — Telehealth: Payer: Self-pay | Admitting: Nurse Practitioner

## 2014-02-20 NOTE — Telephone Encounter (Signed)
appt scheduled for tomorrow at 3:30 with [facility]

## 2014-02-21 ENCOUNTER — Ambulatory Visit (INDEPENDENT_AMBULATORY_CARE_PROVIDER_SITE_OTHER): Payer: Medicare Other | Admitting: Nurse Practitioner

## 2014-02-21 ENCOUNTER — Encounter: Payer: Self-pay | Admitting: Nurse Practitioner

## 2014-02-21 VITALS — BP 116/70 | HR 72 | Temp 98.8°F | Ht 70.0 in | Wt 220.8 lb

## 2014-02-21 DIAGNOSIS — R5383 Other fatigue: Secondary | ICD-10-CM

## 2014-02-21 DIAGNOSIS — Z9289 Personal history of other medical treatment: Secondary | ICD-10-CM

## 2014-02-21 DIAGNOSIS — R229 Localized swelling, mass and lump, unspecified: Secondary | ICD-10-CM

## 2014-02-21 DIAGNOSIS — K219 Gastro-esophageal reflux disease without esophagitis: Secondary | ICD-10-CM

## 2014-02-21 DIAGNOSIS — F329 Major depressive disorder, single episode, unspecified: Secondary | ICD-10-CM

## 2014-02-21 DIAGNOSIS — F32A Depression, unspecified: Secondary | ICD-10-CM

## 2014-02-21 DIAGNOSIS — W57XXXA Bitten or stung by nonvenomous insect and other nonvenomous arthropods, initial encounter: Secondary | ICD-10-CM

## 2014-02-21 DIAGNOSIS — T148 Other injury of unspecified body region: Secondary | ICD-10-CM

## 2014-02-21 DIAGNOSIS — R2230 Localized swelling, mass and lump, unspecified upper limb: Secondary | ICD-10-CM | POA: Insufficient documentation

## 2014-02-21 DIAGNOSIS — R5381 Other malaise: Secondary | ICD-10-CM

## 2014-02-21 DIAGNOSIS — F3289 Other specified depressive episodes: Secondary | ICD-10-CM

## 2014-02-21 DIAGNOSIS — R2231 Localized swelling, mass and lump, right upper limb: Secondary | ICD-10-CM

## 2014-02-21 MED ORDER — FLUOXETINE HCL 40 MG PO CAPS: 40.0000 mg | ORAL_CAPSULE | Freq: Every day | ORAL | Status: DC

## 2014-02-21 NOTE — Progress Notes (Signed)
   Subjective:    Patient ID: [name], female    DOB: March 28, 1956, 58 y.o.   MRN: [id number]  Gastrophageal Reflux She complains of heartburn and a sore throat. She reports no belching, no chest pain, no choking, no coughing or no early satiety. This is a new problem. The current episode started 1 to 4 weeks ago. The problem occurs occasionally. The problem has been unchanged. The heartburn duration is several minutes. The heartburn is located in the substernum. The heartburn is of mild intensity. Risk factors include smoking/tobacco exposure, NSAIDs and lack of exercise. The treatment provided mild relief. Past procedures include esophageal pH monitoring.  *Depression: patient reports feeling down and depressed almost all the time. She has no interest in doing anything. She is fatigue and overeats.    *Knot under right arm, she noticed it 3 days ago. Nontender to palpation.   * Multiple tick bites 6 weeks ago. She report feeling fatigue without fever.    Review of Systems  Constitutional: Negative.   HENT: Positive for sore throat.   Eyes: Negative.   Respiratory: Negative for cough and choking.   Cardiovascular: Negative.  Negative for chest pain.  Gastrointestinal: Positive for heartburn.  Endocrine: Negative.   Genitourinary: Negative.   Allergic/Immunologic: Negative.   Neurological: Negative.   Hematological: Negative.        Objective:   Physical Exam  Constitutional: She is oriented to person, place, and time. She appears well-developed and well-nourished.  HENT:  Head: Normocephalic.  Right Ear: Hearing, tympanic membrane, external ear and ear canal normal.  Left Ear: Hearing, tympanic membrane, external ear and ear canal normal.  Nose: Nose normal.  Mouth/Throat: Uvula is midline, oropharynx is clear and moist and mucous membranes are normal.  Eyes: Conjunctivae and EOM are normal. Pupils are equal, round, and reactive to light.  Neck: Normal range of motion.  Neck supple. No JVD present. No thyromegaly present.  Cardiovascular: Normal rate, regular rhythm, normal heart sounds and intact distal pulses.   No murmur heard. Pulmonary/Chest: Effort normal and breath sounds normal. She has no wheezes. She has no rales. She exhibits mass. Right breast exhibits no inverted nipple, no mass, no nipple discharge, no skin change and no tenderness.  Abdominal: She exhibits no mass.  Musculoskeletal: Normal range of motion.  Neurological: She is alert and oriented to person, place, and time. She has normal reflexes.  Skin: Skin is warm and dry.  Psychiatric: She has a normal mood and affect. Her behavior is normal. Judgment and thought content normal.   BP 116/70  Pulse 72  Temp(Src) 98.8 F (37.1 C) (Oral)  Ht _0  (1.778 m)  Wt 220 lb 12.8 oz (100.154 kg)  BMI 31.68 kg/m2        Assessment & Plan:    1. Axillary mass, right - MM Digital Diagnostic Unilat R; Future - US BREAST COMPLETE UNI RIGHT INC AXILLA; Future  2. Depression Stress management - FLUoxetine (PROZAC) 40 MG capsule; Take 1 capsule (40 mg total) by mouth daily.  Dispense: 90 capsule; Refill: 3  3. Gastroesophageal reflux disease without esophagitis Continue current meds  4. History of mammography, screening - MM Digital Screening Unilat L; Future  5. Other malaise and fatigue Labs pending - Thyroid Panel With TSH - CMP14+EGFR - Anemia Profile B  6. Tick bite Labs pending - Lyme Ab/Western Blot Reflex - Rocky mtn spotted fvr abs pnl(IgG+IgM)  Mary-Margaret Hassell Done, FNP

## 2014-02-21 NOTE — Patient Instructions (Signed)
Depression, Adult Depression refers to feeling sad, low, down in the dumps, blue, gloomy, or empty. In general, there are two kinds of depression: 1. Depression that we all experience from time to time because of upsetting life experiences, including the loss of a job or the ending of a relationship (normal sadness or normal grief). This kind of depression is considered normal, is short lived, and resolves within a few days to 2 weeks. (Depression experienced after the loss of a loved one is called bereavement. Bereavement often lasts longer than 2 weeks but normally gets better with time.) 2. Clinical depression, which lasts longer than normal sadness or normal grief or interferes with your ability to function at home, at work, and in school. It also interferes with your personal relationships. It affects almost every aspect of your life. Clinical depression is an illness. Symptoms of depression also can be caused by conditions other than normal sadness and grief or clinical depression. Examples of these conditions are listed as follows:  Physical illness--Some physical illnesses, including underactive thyroid gland (hypothyroidism), severe anemia, specific types of cancer, diabetes, uncontrolled seizures, heart and lung problems, strokes, and chronic pain are commonly associated with symptoms of depression.  Side effects of some prescription medicine--In some people, certain types of prescription medicine can cause symptoms of depression.  Substance abuse--Abuse of alcohol and illicit drugs can cause symptoms of depression. SYMPTOMS Symptoms of normal sadness and normal grief include the following:  Feeling sad or crying for short periods of time.  Not caring about anything (apathy).  Difficulty sleeping or sleeping too much.  No longer able to enjoy the things you used to enjoy.  Desire to be by oneself all the time (social isolation).  Lack of energy or motivation.  Difficulty  concentrating or remembering.  Change in appetite or weight.  Restlessness or agitation. Symptoms of clinical depression include the same symptoms of normal sadness or normal grief and also the following symptoms:  Feeling sad or crying all the time.  Feelings of guilt or worthlessness.  Feelings of hopelessness or helplessness.  Thoughts of suicide or the desire to harm yourself (suicidal ideation).  Loss of touch with reality (psychotic symptoms). Seeing or hearing things that are not real (hallucinations) or having false beliefs about your life or the people around you (delusions and paranoia). DIAGNOSIS  The diagnosis of clinical depression usually is based on the severity and duration of the symptoms. Your caregiver also will ask you questions about your medical history and substance use to find out if physical illness, use of prescription medicine, or substance abuse is causing your depression. Your caregiver also may order blood tests. TREATMENT  Typically, normal sadness and normal grief do not require treatment. However, sometimes antidepressant medicine is prescribed for bereavement to ease the depressive symptoms until they resolve. The treatment for clinical depression depends on the severity of your symptoms but typically includes antidepressant medicine, counseling with a mental health professional, or a combination of both. Your caregiver will help to determine what treatment is best for you. Depression caused by physical illness usually goes away with appropriate medical treatment of the illness. If prescription medicine is causing depression, talk with your caregiver about stopping the medicine, decreasing the dose, or substituting another medicine. Depression caused by abuse of alcohol or illicit drugs abuse goes away with abstinence from these substances. Some adults need professional help in order to stop drinking or using drugs. SEEK IMMEDIATE CARE IF:  You have thoughts  about   hurting yourself or others.  You lose touch with reality (have psychotic symptoms).  You are taking medicine for depression and have a serious side effect. FOR MORE INFORMATION National Alliance on Mental Illness: www.nami.org National Institute of Mental Health: www.nimh.nih.gov Document Released: 07/25/2000 Document Revised: 01/27/2012 Document Reviewed: 10/27/2011 ExitCare Patient Information 2015 [employer]. This information is not intended to replace advice given to you by your health care provider. Make sure you discuss any questions you have with your health care provider.  

## 2014-02-23 LAB — LYME AB/WESTERN BLOT REFLEX
LYME DISEASE AB, QUANT, IGM: <0.8 index (ref 0.00–0.79)
Lyme IgG/IgM Ab: <0.91 {ISR} (ref 0.00–0.90)

## 2014-02-23 LAB — ANEMIA PROFILE B
Basophils Absolute: 0 10*3/uL (ref 0.0–0.2)
Basos: 1 %
EOS: 3 %
Eosinophils Absolute: 0.2 10*3/uL (ref 0.0–0.4)
FERRITIN: 19 ng/mL (ref 15–150)
Folate: 8.5 ng/mL (ref >3.0)
HCT: 38.8 % (ref 34.0–46.6)
Hemoglobin: 12.7 g/dL (ref 11.1–15.9)
IRON SATURATION: 18 % (ref 15–55)
IRON: 61 ug/dL (ref 35–155)
Immature Grans (Abs): 0 10*3/uL (ref 0.0–0.1)
Immature Granulocytes: 0 %
LYMPHS ABS: 3.1 10*3/uL (ref 0.7–3.1)
Lymphs: 37 %
MCH: 28.3 pg (ref 26.6–33.0)
MCHC: 32.7 g/dL (ref 31.5–35.7)
MCV: 86 fL (ref 79–97)
Monocytes Absolute: 0.4 10*3/uL (ref 0.1–0.9)
Monocytes: 5 %
Neutrophils Absolute: 4.7 10*3/uL (ref 1.4–7.0)
Neutrophils Relative %: 54 %
Platelets: 242 10*3/uL (ref 150–379)
RBC: 4.49 x10E6/uL (ref 3.77–5.28)
RDW: 13.7 % (ref 12.3–15.4)
Retic Ct Pct: 0.8 % (ref 0.6–2.6)
TIBC: 344 ug/dL (ref 250–450)
UIBC: 283 ug/dL (ref 150–375)
Vitamin B-12: 199 pg/mL — ABNORMAL LOW (ref 211–946)
WBC: 8.5 10*3/uL (ref 3.4–10.8)

## 2014-02-23 LAB — THYROID PANEL WITH TSH
Free Thyroxine Index: 1.7 (ref 1.2–4.9)
T3 Uptake Ratio: 26 % (ref 24–39)
T4, Total: 6.7 ug/dL (ref 4.5–12.0)
TSH: 3.29 u[IU]/mL (ref 0.450–4.500)

## 2014-02-23 LAB — CMP14+EGFR
ALBUMIN: 4.1 g/dL (ref 3.5–5.5)
ALT: 11 IU/L (ref 0–32)
AST: 9 IU/L (ref 0–40)
Albumin/Globulin Ratio: 2.2 (ref 1.1–2.5)
Alkaline Phosphatase: 66 IU/L (ref 39–117)
BUN/Creatinine Ratio: 17 (ref 9–23)
BUN: 12 mg/dL (ref 6–24)
CO2: 26 mmol/L (ref 18–29)
CREATININE: 0.69 mg/dL (ref 0.57–1.00)
Calcium: 9.2 mg/dL (ref 8.7–10.2)
Chloride: 101 mmol/L (ref 97–108)
GFR calc Af Amer: 113 mL/min/{1.73_m2} (ref >59)
GFR calc non Af Amer: 98 mL/min/{1.73_m2} (ref >59)
GLOBULIN, TOTAL: 1.9 g/dL (ref 1.5–4.5)
GLUCOSE: 124 mg/dL — AB (ref 65–99)
Potassium: 4.1 mmol/L (ref 3.5–5.2)
Sodium: 141 mmol/L (ref 134–144)
TOTAL PROTEIN: 6 g/dL (ref 6.0–8.5)
Total Bilirubin: 0.2 mg/dL (ref 0.0–1.2)

## 2014-02-23 LAB — ROCKY MTN SPOTTED FVR ABS PNL(IGG+IGM)
RMSF IGG: NEGATIVE
RMSF IgM: 0.39 index (ref 0.00–0.89)

## 2014-02-24 ENCOUNTER — Ambulatory Visit (INDEPENDENT_AMBULATORY_CARE_PROVIDER_SITE_OTHER): Payer: Medicare Other | Admitting: *Deleted

## 2014-02-24 DIAGNOSIS — E538 Deficiency of other specified B group vitamins: Secondary | ICD-10-CM

## 2014-02-24 MED ORDER — CYANOCOBALAMIN 1000 MCG/ML IJ SOLN
1000.0000 ug | INTRAMUSCULAR | Status: AC
  Administered 2014-02-24 – 2014-03-20 (×4): 1000 ug via INTRAMUSCULAR

## 2014-02-24 NOTE — Patient Instructions (Signed)
Vitamin B12 Injections Every person needs vitamin B12. A deficiency develops when the body does not get enough of it. One way to overcome this is by getting B12 shots (injections). A B12 shot puts the vitamin directly into muscle tissue. This avoids any problems your body might have in absorbing it from food or a pill. In some people, the body has trouble using the vitamin correctly. This can cause a B12 deficiency. Not consuming enough of the vitamin can also cause a deficiency. Getting enough vitamin B12 can be hard for elderly people. Sometimes, they do not eat a well-balanced diet. The elderly are also more likely than younger people to have medical conditions or take medications that can lead to a deficiency. WHAT DOES VITAMIN B12 DO? Vitamin B12 does many things to help the body work right:  It helps the body make healthy red blood cells.  It helps maintain nerve cells.  It is involved in the body's process of converting food into energy (metabolism).  It is needed to make the genetic material in all cells (DNA). VITAMIN B12 FOOD SOURCES Most people get plenty of vitamin B12 through the foods they eat. It is present in:  Meat, fish, poultry, and eggs.  Milk and milk products.  It also is added when certain foods are made, including some breads, cereals and yogurts. The food is then called "fortified". CAUSES The most common causes of vitamin B12 deficiency are:  Pernicious anemia. The condition develops when the body cannot make enough healthy red blood cells. This stems from a lack of a protein made in the stomach (intrinsic factor). People without this protein cannot absorb enough vitamin B12 from food.  Malabsorption. This is when the body cannot absorb the vitamin. It can be caused by:  Pernicious anemia.  Surgery to remove part or all of the stomach can lead to malabsorption. Removal of part or all of the small intestine can also cause malabsorption.  Vegetarian diet.  People who are strict about not eating foods from animals could have trouble taking in enough vitamin B12 from diet alone.  Medications. Some medicines have been linked to B12 deficiency, such as Metformin (a drug prescribed for type 2 diabetes). Long-term use of stomach acid suppressants also can keep the vitamin from being absorbed.  Intestinal problems such as inflammatory bowel disease. If there are problems in the digestive tract, vitamin B12 may not be absorbed in good enough amounts. SYMPTOMS People who do not get enough B12 can develop problems. These can include:  Anemia. This is when the body has too few red blood cells. Red blood cells carry oxygen to the rest of the body. Without a healthy supply of red blood cells, people can feel:  Tired (fatigued).  Weak.  Severe anemia can cause:  Shortness of breath.  Dizziness.  Rapid heart rate.  Paleness.  Other Vitamin B12 deficiency symptoms include:  Diarrhea.  Numbness or tingling in the hands or feet.  Loss of appetite.  Confusion.  Sores on the tongue or in the mouth. LET YOUR CAREGIVER KNOW ABOUT:  Any allergies. It is very important to know if you are allergic or sensitive to cobalt. Vitamin B12 contains cobalt.  Any history of kidney disease.  All medications you are taking. Include prescription and over-the-counter medicines, herbs and creams.  Whether you are pregnant or breast-feeding.  If you have Leber's disease, a hereditary eye condition, vitamin B12 could make it worse. RISKS AND COMPLICATIONS Reactions to an injection are   usually temporary. They might include:  Pain at the injection site.  Redness, swelling or tenderness at the site.  Headache, dizziness or weakness.  Nausea, upset stomach or diarrhea.  Numbness or tingling.  Fever.  Joint pain.  Itching or rash. If a reaction does not go away in a short while, talk with your healthcare provider. A change in the way the shots are  given, or where they are given, might need to be made. BEFORE AN INJECTION To decide whether B12 injections are right for you, your healthcare provider will probably:  Ask about your medical history.  Ask questions about your diet.  Ask about symptoms such as:  Have you felt weak?  Do you feel unusually tired?  Do you get dizzy?  Order blood tests. These may include a test to:  Check the level of red cells in your blood.  Measure B12 levels.  Check for the presence of intrinsic factor. VITAMIN B12 INJECTIONS How often you will need a vitamin B12 injection will depend on how severe your deficiency is. This also will affect how long you will need to get them. People with pernicious anemia usually get injections for their entire life. Others might get them for a shorter period. For many people, injections are given daily or weekly for several weeks. Then, once B12 levels are normal, injections are given just once a month. If the cause of the deficiency can be fixed, the injections can be stopped. Talk with your healthcare provider about what you should expect. For an injection:  The injection site will be cleaned with an alcohol swab.  Your healthcare provider will insert a needle directly into a muscle. Most any muscle can be used. Most often, an arm muscle is used. A buttocks muscle can also be used. Many people say shots in that area are less painful.  A small adhesive bandage may be put over the injection site. It usually can be taken off in an hour or less. Injections can be given by your healthcare provider. In some cases, family members give them. Sometimes, people give them to themselves. Talk with your healthcare provider about what would be best for you. If someone other than your healthcare provider will be giving the shots, the person will need to be trained to give them correctly. HOME CARE INSTRUCTIONS   You can remove the adhesive bandage within an hour of getting a  shot.  You should be able to go about your normal activities right away.  Avoid drinking large amounts of alcohol while taking vitamin B12 shots. Alcohol can interfere with the body's use of the vitamin. SEEK MEDICAL CARE IF:   Pain, redness, swelling or tenderness at the injection site does not get better or gets worse.  Headache, dizziness or weakness does not go away.  You develop a fever of more than 100.5 F (38.1 C). SEEK IMMEDIATE MEDICAL CARE IF:   You have chest pain.  You develop shortness of breath.  You have muscle weakness that gets worse.  You develop numbness, weakness or tingling on one side or one area of the body.  You have symptoms of an allergic reaction, such as:  Hives.  Difficulty breathing.  Swelling of the lips, face, tongue or throat.  You develop a fever of more than 102.0 F (38.9 C). MAKE SURE YOU:   Understand these instructions.  Will watch your condition.  Will get help right away if you are not doing well or get worse. Document   Released: 10/24/2008 Document Revised: 10/20/2011 Document Reviewed: 10/24/2008 ExitCare Patient Information 2015 [employer]. This information is not intended to replace advice given to you by your health care provider. Make sure you discuss any questions you have with your health care provider.  

## 2014-02-24 NOTE — Progress Notes (Signed)
Patient tolerated well.

## 2014-02-27 ENCOUNTER — Ambulatory Visit: Payer: Self-pay

## 2014-03-02 ENCOUNTER — Ambulatory Visit
Admission: RE | Admit: 2014-03-02 | Discharge: 2014-03-02 | Disposition: A | Discharge status: Home / Self Care | Payer: Medicare Other | Source: Ambulatory Visit | Attending: Nurse Practitioner | Admitting: Nurse Practitioner

## 2014-03-02 DIAGNOSIS — R2231 Localized swelling, mass and lump, right upper limb: Secondary | ICD-10-CM

## 2014-03-03 ENCOUNTER — Ambulatory Visit (INDEPENDENT_AMBULATORY_CARE_PROVIDER_SITE_OTHER): Payer: Medicare Other | Admitting: *Deleted

## 2014-03-03 DIAGNOSIS — E538 Deficiency of other specified B group vitamins: Secondary | ICD-10-CM

## 2014-03-03 NOTE — Patient Instructions (Signed)
Vitamin B12 Injections Every person needs vitamin B12. A deficiency develops when the body does not get enough of it. One way to overcome this is by getting B12 shots (injections). A B12 shot puts the vitamin directly into muscle tissue. This avoids any problems your body might have in absorbing it from food or a pill. In some people, the body has trouble using the vitamin correctly. This can cause a B12 deficiency. Not consuming enough of the vitamin can also cause a deficiency. Getting enough vitamin B12 can be hard for elderly people. Sometimes, they do not eat a well-balanced diet. The elderly are also more likely than younger people to have medical conditions or take medications that can lead to a deficiency. WHAT DOES VITAMIN B12 DO? Vitamin B12 does many things to help the body work right:  It helps the body make healthy red blood cells.  It helps maintain nerve cells.  It is involved in the body's process of converting food into energy (metabolism).  It is needed to make the genetic material in all cells (DNA). VITAMIN B12 FOOD SOURCES Most people get plenty of vitamin B12 through the foods they eat. It is present in:  Meat, fish, poultry, and eggs.  Milk and milk products.  It also is added when certain foods are made, including some breads, cereals and yogurts. The food is then called "fortified". CAUSES The most common causes of vitamin B12 deficiency are:  Pernicious anemia. The condition develops when the body cannot make enough healthy red blood cells. This stems from a lack of a protein made in the stomach (intrinsic factor). People without this protein cannot absorb enough vitamin B12 from food.  Malabsorption. This is when the body cannot absorb the vitamin. It can be caused by:  Pernicious anemia.  Surgery to remove part or all of the stomach can lead to malabsorption. Removal of part or all of the small intestine can also cause malabsorption.  Vegetarian diet.  People who are strict about not eating foods from animals could have trouble taking in enough vitamin B12 from diet alone.  Medications. Some medicines have been linked to B12 deficiency, such as Metformin (a drug prescribed for type 2 diabetes). Long-term use of stomach acid suppressants also can keep the vitamin from being absorbed.  Intestinal problems such as inflammatory bowel disease. If there are problems in the digestive tract, vitamin B12 may not be absorbed in good enough amounts. SYMPTOMS People who do not get enough B12 can develop problems. These can include:  Anemia. This is when the body has too few red blood cells. Red blood cells carry oxygen to the rest of the body. Without a healthy supply of red blood cells, people can feel:  Tired (fatigued).  Weak.  Severe anemia can cause:  Shortness of breath.  Dizziness.  Rapid heart rate.  Paleness.  Other Vitamin B12 deficiency symptoms include:  Diarrhea.  Numbness or tingling in the hands or feet.  Loss of appetite.  Confusion.  Sores on the tongue or in the mouth. LET YOUR CAREGIVER KNOW ABOUT:  Any allergies. It is very important to know if you are allergic or sensitive to cobalt. Vitamin B12 contains cobalt.  Any history of kidney disease.  All medications you are taking. Include prescription and over-the-counter medicines, herbs and creams.  Whether you are pregnant or breast-feeding.  If you have Leber's disease, a hereditary eye condition, vitamin B12 could make it worse. RISKS AND COMPLICATIONS Reactions to an injection are   usually temporary. They might include:  Pain at the injection site.  Redness, swelling or tenderness at the site.  Headache, dizziness or weakness.  Nausea, upset stomach or diarrhea.  Numbness or tingling.  Fever.  Joint pain.  Itching or rash. If a reaction does not go away in a short while, talk with your healthcare provider. A change in the way the shots are  given, or where they are given, might need to be made. BEFORE AN INJECTION To decide whether B12 injections are right for you, your healthcare provider will probably:  Ask about your medical history.  Ask questions about your diet.  Ask about symptoms such as:  Have you felt weak?  Do you feel unusually tired?  Do you get dizzy?  Order blood tests. These may include a test to:  Check the level of red cells in your blood.  Measure B12 levels.  Check for the presence of intrinsic factor. VITAMIN B12 INJECTIONS How often you will need a vitamin B12 injection will depend on how severe your deficiency is. This also will affect how long you will need to get them. People with pernicious anemia usually get injections for their entire life. Others might get them for a shorter period. For many people, injections are given daily or weekly for several weeks. Then, once B12 levels are normal, injections are given just once a month. If the cause of the deficiency can be fixed, the injections can be stopped. Talk with your healthcare provider about what you should expect. For an injection:  The injection site will be cleaned with an alcohol swab.  Your healthcare provider will insert a needle directly into a muscle. Most any muscle can be used. Most often, an arm muscle is used. A buttocks muscle can also be used. Many people say shots in that area are less painful.  A small adhesive bandage may be put over the injection site. It usually can be taken off in an hour or less. Injections can be given by your healthcare provider. In some cases, family members give them. Sometimes, people give them to themselves. Talk with your healthcare provider about what would be best for you. If someone other than your healthcare provider will be giving the shots, the person will need to be trained to give them correctly. HOME CARE INSTRUCTIONS   You can remove the adhesive bandage within an hour of getting a  shot.  You should be able to go about your normal activities right away.  Avoid drinking large amounts of alcohol while taking vitamin B12 shots. Alcohol can interfere with the body's use of the vitamin. SEEK MEDICAL CARE IF:   Pain, redness, swelling or tenderness at the injection site does not get better or gets worse.  Headache, dizziness or weakness does not go away.  You develop a fever of more than 100.5 F (38.1 C). SEEK IMMEDIATE MEDICAL CARE IF:   You have chest pain.  You develop shortness of breath.  You have muscle weakness that gets worse.  You develop numbness, weakness or tingling on one side or one area of the body.  You have symptoms of an allergic reaction, such as:  Hives.  Difficulty breathing.  Swelling of the lips, face, tongue or throat.  You develop a fever of more than 102.0 F (38.9 C). MAKE SURE YOU:   Understand these instructions.  Will watch your condition.  Will get help right away if you are not doing well or get worse. Document   Released: 10/24/2008 Document Revised: 10/20/2011 Document Reviewed: 10/24/2008 ExitCare Patient Information 2015 [employer]. This information is not intended to replace advice given to you by your health care provider. Make sure you discuss any questions you have with your health care provider.  

## 2014-03-03 NOTE — Progress Notes (Signed)
Vitamin b12 given and tolerated back

## 2014-03-13 ENCOUNTER — Ambulatory Visit (INDEPENDENT_AMBULATORY_CARE_PROVIDER_SITE_OTHER): Payer: Medicare Other | Admitting: *Deleted

## 2014-03-13 DIAGNOSIS — E538 Deficiency of other specified B group vitamins: Secondary | ICD-10-CM

## 2014-03-13 NOTE — Patient Instructions (Signed)
Vitamin B12 Injections Every person needs vitamin B12. A deficiency develops when the body does not get enough of it. One way to overcome this is by getting B12 shots (injections). A B12 shot puts the vitamin directly into muscle tissue. This avoids any problems your body might have in absorbing it from food or a pill. In some people, the body has trouble using the vitamin correctly. This can cause a B12 deficiency. Not consuming enough of the vitamin can also cause a deficiency. Getting enough vitamin B12 can be hard for elderly people. Sometimes, they do not eat a well-balanced diet. The elderly are also more likely than younger people to have medical conditions or take medications that can lead to a deficiency. WHAT DOES VITAMIN B12 DO? Vitamin B12 does many things to help the body work right:  It helps the body make healthy red blood cells.  It helps maintain nerve cells.  It is involved in the body's process of converting food into energy (metabolism).  It is needed to make the genetic material in all cells (DNA). VITAMIN B12 FOOD SOURCES Most people get plenty of vitamin B12 through the foods they eat. It is present in:  Meat, fish, poultry, and eggs.  Milk and milk products.  It also is added when certain foods are made, including some breads, cereals and yogurts. The food is then called "fortified". CAUSES The most common causes of vitamin B12 deficiency are:  Pernicious anemia. The condition develops when the body cannot make enough healthy red blood cells. This stems from a lack of a protein made in the stomach (intrinsic factor). People without this protein cannot absorb enough vitamin B12 from food.  Malabsorption. This is when the body cannot absorb the vitamin. It can be caused by:  Pernicious anemia.  Surgery to remove part or all of the stomach can lead to malabsorption. Removal of part or all of the small intestine can also cause malabsorption.  Vegetarian diet.  People who are strict about not eating foods from animals could have trouble taking in enough vitamin B12 from diet alone.  Medications. Some medicines have been linked to B12 deficiency, such as Metformin (a drug prescribed for type 2 diabetes). Long-term use of stomach acid suppressants also can keep the vitamin from being absorbed.  Intestinal problems such as inflammatory bowel disease. If there are problems in the digestive tract, vitamin B12 may not be absorbed in good enough amounts. SYMPTOMS People who do not get enough B12 can develop problems. These can include:  Anemia. This is when the body has too few red blood cells. Red blood cells carry oxygen to the rest of the body. Without a healthy supply of red blood cells, people can feel:  Tired (fatigued).  Weak.  Severe anemia can cause:  Shortness of breath.  Dizziness.  Rapid heart rate.  Paleness.  Other Vitamin B12 deficiency symptoms include:  Diarrhea.  Numbness or tingling in the hands or feet.  Loss of appetite.  Confusion.  Sores on the tongue or in the mouth. LET YOUR CAREGIVER KNOW ABOUT:  Any allergies. It is very important to know if you are allergic or sensitive to cobalt. Vitamin B12 contains cobalt.  Any history of kidney disease.  All medications you are taking. Include prescription and over-the-counter medicines, herbs and creams.  Whether you are pregnant or breast-feeding.  If you have Leber's disease, a hereditary eye condition, vitamin B12 could make it worse. RISKS AND COMPLICATIONS Reactions to an injection are   usually temporary. They might include:  Pain at the injection site.  Redness, swelling or tenderness at the site.  Headache, dizziness or weakness.  Nausea, upset stomach or diarrhea.  Numbness or tingling.  Fever.  Joint pain.  Itching or rash. If a reaction does not go away in a short while, talk with your healthcare provider. A change in the way the shots are  given, or where they are given, might need to be made. BEFORE AN INJECTION To decide whether B12 injections are right for you, your healthcare provider will probably:  Ask about your medical history.  Ask questions about your diet.  Ask about symptoms such as:  Have you felt weak?  Do you feel unusually tired?  Do you get dizzy?  Order blood tests. These may include a test to:  Check the level of red cells in your blood.  Measure B12 levels.  Check for the presence of intrinsic factor. VITAMIN B12 INJECTIONS How often you will need a vitamin B12 injection will depend on how severe your deficiency is. This also will affect how long you will need to get them. People with pernicious anemia usually get injections for their entire life. Others might get them for a shorter period. For many people, injections are given daily or weekly for several weeks. Then, once B12 levels are normal, injections are given just once a month. If the cause of the deficiency can be fixed, the injections can be stopped. Talk with your healthcare provider about what you should expect. For an injection:  The injection site will be cleaned with an alcohol swab.  Your healthcare provider will insert a needle directly into a muscle. Most any muscle can be used. Most often, an arm muscle is used. A buttocks muscle can also be used. Many people say shots in that area are less painful.  A small adhesive bandage may be put over the injection site. It usually can be taken off in an hour or less. Injections can be given by your healthcare provider. In some cases, family members give them. Sometimes, people give them to themselves. Talk with your healthcare provider about what would be best for you. If someone other than your healthcare provider will be giving the shots, the person will need to be trained to give them correctly. HOME CARE INSTRUCTIONS   You can remove the adhesive bandage within an hour of getting a  shot.  You should be able to go about your normal activities right away.  Avoid drinking large amounts of alcohol while taking vitamin B12 shots. Alcohol can interfere with the body's use of the vitamin. SEEK MEDICAL CARE IF:   Pain, redness, swelling or tenderness at the injection site does not get better or gets worse.  Headache, dizziness or weakness does not go away.  You develop a fever of more than 100.5 F (38.1 C). SEEK IMMEDIATE MEDICAL CARE IF:   You have chest pain.  You develop shortness of breath.  You have muscle weakness that gets worse.  You develop numbness, weakness or tingling on one side or one area of the body.  You have symptoms of an allergic reaction, such as:  Hives.  Difficulty breathing.  Swelling of the lips, face, tongue or throat.  You develop a fever of more than 102.0 F (38.9 C). MAKE SURE YOU:   Understand these instructions.  Will watch your condition.  Will get help right away if you are not doing well or get worse. Document   Released: 10/24/2008 Document Revised: 10/20/2011 Document Reviewed: 10/24/2008 ExitCare Patient Information 2015 [employer]. This information is not intended to replace advice given to you by your health care provider. Make sure you discuss any questions you have with your health care provider.  

## 2014-03-13 NOTE — Progress Notes (Signed)
Vitamin b12 injection given and tolerated well.  

## 2014-03-20 ENCOUNTER — Encounter: Payer: Self-pay | Admitting: Nurse Practitioner

## 2014-03-20 ENCOUNTER — Ambulatory Visit (INDEPENDENT_AMBULATORY_CARE_PROVIDER_SITE_OTHER): Payer: Medicare Other | Admitting: Nurse Practitioner

## 2014-03-20 VITALS — BP 110/62 | HR 67 | Temp 99.0°F | Ht 70.0 in | Wt 217.6 lb

## 2014-03-20 DIAGNOSIS — E782 Mixed hyperlipidemia: Secondary | ICD-10-CM

## 2014-03-20 DIAGNOSIS — Z720 Tobacco use: Secondary | ICD-10-CM

## 2014-03-20 DIAGNOSIS — G35 Multiple sclerosis: Secondary | ICD-10-CM

## 2014-03-20 DIAGNOSIS — J449 Chronic obstructive pulmonary disease, unspecified: Secondary | ICD-10-CM

## 2014-03-20 DIAGNOSIS — Z6831 Body mass index (BMI) 31.0-31.9, adult: Secondary | ICD-10-CM

## 2014-03-20 DIAGNOSIS — D518 Other vitamin B12 deficiency anemias: Secondary | ICD-10-CM

## 2014-03-20 DIAGNOSIS — D519 Vitamin B12 deficiency anemia, unspecified: Secondary | ICD-10-CM | POA: Insufficient documentation

## 2014-03-20 DIAGNOSIS — K219 Gastro-esophageal reflux disease without esophagitis: Secondary | ICD-10-CM

## 2014-03-20 DIAGNOSIS — J4489 Other specified chronic obstructive pulmonary disease: Secondary | ICD-10-CM

## 2014-03-20 DIAGNOSIS — E1149 Type 2 diabetes mellitus with other diabetic neurological complication: Secondary | ICD-10-CM

## 2014-03-20 DIAGNOSIS — Z6830 Body mass index (BMI) 30.0-30.9, adult: Secondary | ICD-10-CM | POA: Insufficient documentation

## 2014-03-20 DIAGNOSIS — E538 Deficiency of other specified B group vitamins: Secondary | ICD-10-CM

## 2014-03-20 DIAGNOSIS — F17200 Nicotine dependence, unspecified, uncomplicated: Secondary | ICD-10-CM

## 2014-03-20 DIAGNOSIS — Z713 Dietary counseling and surveillance: Secondary | ICD-10-CM

## 2014-03-20 DIAGNOSIS — E1142 Type 2 diabetes mellitus with diabetic polyneuropathy: Secondary | ICD-10-CM

## 2014-03-20 LAB — POCT GLYCOSYLATED HEMOGLOBIN (HGB A1C): Hemoglobin A1C: 6

## 2014-03-20 NOTE — Patient Instructions (Signed)
Diabetes and Foot Care Diabetes may cause you to have problems because of poor blood supply (circulation) to your feet and legs. This may cause the skin on your feet to become thinner, break easier, and heal more slowly. Your skin may become dry, and the skin may peel and crack. You may also have nerve damage in your legs and feet causing decreased feeling in them. You may not notice minor injuries to your feet that could lead to infections or more serious problems. Taking care of your feet is one of the most important things you can do for yourself.  HOME CARE INSTRUCTIONS  Wear shoes at all times, even in the house. Do not go barefoot. Bare feet are easily injured.  Check your feet daily for blisters, cuts, and redness. If you cannot see the bottom of your feet, use a mirror or ask someone for help.  Wash your feet with warm water (do not use hot water) and mild soap. Then pat your feet and the areas between your toes until they are completely dry. Do not soak your feet as this can dry your skin.  Apply a moisturizing lotion or petroleum jelly (that does not contain alcohol and is unscented) to the skin on your feet and to dry, brittle toenails. Do not apply lotion between your toes.  Trim your toenails straight across. Do not dig under them or around the cuticle. File the edges of your nails with an emery board or nail file.  Do not cut corns or calluses or try to remove them with medicine.  Wear clean socks or stockings every day. Make sure they are not too tight. Do not wear knee-high stockings since they may decrease blood flow to your legs.  Wear shoes that fit properly and have enough cushioning. To break in new shoes, wear them for just a few hours a day. This prevents you from injuring your feet. Always look in your shoes before you put them on to be sure there are no objects inside.  Do not cross your legs. This may decrease the blood flow to your feet.  If you find a minor scrape,  cut, or break in the skin on your feet, keep it and the skin around it clean and dry. These areas may be cleansed with mild soap and water. Do not cleanse the area with peroxide, alcohol, or iodine.  When you remove an adhesive bandage, be sure not to damage the skin around it.  If you have a wound, look at it several times a day to make sure it is healing.  Do not use heating pads or hot water bottles. They may burn your skin. If you have lost feeling in your feet or legs, you may not know it is happening until it is too late.  Make sure your health care provider performs a complete foot exam at least annually or more often if you have foot problems. Report any cuts, sores, or bruises to your health care provider immediately. SEEK MEDICAL CARE IF:   You have an injury that is not healing.  You have cuts or breaks in the skin.  You have an ingrown nail.  You notice redness on your legs or feet.  You feel burning or tingling in your legs or feet.  You have pain or cramps in your legs and feet.  Your legs or feet are numb.  Your feet always feel cold. SEEK IMMEDIATE MEDICAL CARE IF:   There is increasing redness,   swelling, or pain in or around a wound.  There is a red line that goes up your leg.  Pus is coming from a wound.  You develop a fever or as directed by your health care provider.  You notice a bad smell coming from an ulcer or wound. Document Released: 07/25/2000 Document Revised: 03/30/2013 Document Reviewed: 01/04/2013 ExitCare Patient Information 2015 [employer]. This information is not intended to replace advice given to you by your health care provider. Make sure you discuss any questions you have with your health care provider.  

## 2014-03-20 NOTE — Progress Notes (Signed)
Subjective:    Patient ID: [name], female    DOB: [birth year], 58 y.o.   MRN: [id number]  Patient here today for follow up of chronic medical problems.  Hyperlipidemia This is a chronic problem. The current episode started more than 1 year ago. The problem is uncontrolled. Recent lipid tests were reviewed and are normal. Exacerbating diseases include diabetes. She has no history of liver disease or nephrotic syndrome. There are no known factors aggravating her hyperlipidemia. Pertinent negatives include no focal sensory loss, focal weakness, myalgias or shortness of breath. She is currently on no antihyperlipidemic treatment. The current treatment provides no improvement of lipids. Risk factors for coronary artery disease include diabetes mellitus, family history and post-menopausal.  Diabetes She presents for her follow-up diabetic visit. She has type 2 diabetes mellitus. No MedicAlert identification noted. The initial diagnosis of diabetes was made 1 year ago. Her disease course has been stable. There are no hypoglycemic associated symptoms. Pertinent negatives for diabetes include no blurred vision, no foot paresthesias, no polydipsia, no polyphagia, no polyuria, no visual change and no weakness. There are no hypoglycemic complications. Symptoms are stable. There are no diabetic complications. Risk factors for coronary artery disease include dyslipidemia, family history and post-menopausal. Current diabetic treatment includes oral agent (monotherapy) and diet. She is compliant with treatment most of the time. Her weight is stable. She is following a generally healthy diet. When asked about meal planning, she reported none. She has not had a previous visit with a dietician. She rarely participates in exercise. (Patient doesn't check blood sugars at home) An ACE inhibitor/angiotensin II receptor blocker is not being taken. She does not see a podiatrist.Eye exam is not current.  MS Sees Dr Bjorn Loser  in [place] every 6 months- currently under control GERD omerprazole daily- keeps symptom sunder control bil foot neuropathy Gabapentin helps with bruning senastion- no c/ side effects COPD  No maintenance inhaler- only ues albuterol as needed- hasn't needed in about 2 months B 12 def Found at last visit- has been getting injections weekly for the  Last month- goes to [facility] for 2 then [place]- will repeat labs at next visit.  Review of Systems  Eyes: Negative for blurred vision.  Respiratory: Negative for shortness of breath.   Endocrine: Negative for polydipsia, polyphagia and polyuria.  Musculoskeletal: Negative for myalgias.  Neurological: Negative for focal weakness and weakness.       Objective:   Physical Exam  Constitutional: She is oriented to person, place, and time. She appears well-developed and well-nourished.  HENT:  Nose: Nose normal.  Mouth/Throat: Oropharynx is clear and moist.  Eyes: EOM are normal.  Neck: Trachea normal, normal range of motion and full passive range of motion without pain. Neck supple. No JVD present. Carotid bruit is not present. No thyromegaly present.  Cardiovascular: Normal rate, regular rhythm, normal heart sounds and intact distal pulses.  Exam reveals no gallop and no friction rub.   No murmur heard. Pulmonary/Chest: Effort normal and breath sounds normal.  Abdominal: Soft. Bowel sounds are normal. She exhibits no distension and no mass. There is no tenderness.  Musculoskeletal: Normal range of motion.  Lymphadenopathy:    She has no cervical adenopathy.  Neurological: She is alert and oriented to person, place, and time. She has normal reflexes.  Skin: Skin is warm and dry.  Psychiatric: She has a normal mood and affect. Her behavior is normal. Judgment and thought content normal.    BP 110/62  Pulse 67  Temp(Src) 99 F (37.2 C) (Oral)  Ht 5\' 10"  (1.778 m)  Wt 217 lb 9.6 oz (98.703 kg)  BMI 31.22 kg/m2 Results for orders placed in  visit on 03/20/14  POCT GLYCOSYLATED HEMOGLOBIN (HGB A1C)      Result Value Ref Range   Hemoglobin A1C 6.0           Assessment & Plan:

## 2014-03-21 LAB — CMP14+EGFR
A/G RATIO: 2 (ref 1.1–2.5)
ALT: 7 IU/L (ref 0–32)
AST: 15 IU/L (ref 0–40)
Albumin: 4.2 g/dL (ref 3.5–5.5)
Alkaline Phosphatase: 70 IU/L (ref 39–117)
BILIRUBIN TOTAL: 0.4 mg/dL (ref 0.0–1.2)
BUN/Creatinine Ratio: 12 (ref 9–23)
BUN: 10 mg/dL (ref 6–24)
CALCIUM: 9 mg/dL (ref 8.7–10.2)
CO2: 18 mmol/L (ref 18–29)
CREATININE: 0.82 mg/dL (ref 0.57–1.00)
Chloride: 104 mmol/L (ref 97–108)
GFR calc Af Amer: 93 mL/min/{1.73_m2} (ref >59)
GFR, EST NON AFRICAN AMERICAN: 81 mL/min/{1.73_m2} (ref >59)
GLOBULIN, TOTAL: 2.1 g/dL (ref 1.5–4.5)
Glucose: 113 mg/dL — ABNORMAL HIGH (ref 65–99)
Potassium: 4.4 mmol/L (ref 3.5–5.2)
SODIUM: 144 mmol/L (ref 134–144)
TOTAL PROTEIN: 6.3 g/dL (ref 6.0–8.5)

## 2014-03-21 LAB — NMR, LIPOPROFILE
Cholesterol: 217 mg/dL — ABNORMAL HIGH (ref 100–199)
HDL Cholesterol by NMR: 36 mg/dL — ABNORMAL LOW (ref >39)
HDL PARTICLE NUMBER: 27.4 umol/L — AB (ref >=30.5)
LDL Particle Number: 2088 nmol/L — ABNORMAL HIGH (ref <1000)
LDL SIZE: 20.7 nm (ref >20.5)
LDLC SERPL CALC-MCNC: 148 mg/dL — ABNORMAL HIGH (ref 0–99)
LP-IR Score: 81 — ABNORMAL HIGH (ref <=45)
SMALL LDL PARTICLE NUMBER: 1000 nmol/L — AB (ref <=527)
TRIGLYCERIDES BY NMR: 164 mg/dL — AB (ref 0–149)

## 2014-03-23 ENCOUNTER — Other Ambulatory Visit: Payer: Self-pay | Admitting: Nurse Practitioner

## 2014-03-23 MED ORDER — ATORVASTATIN CALCIUM 40 MG PO TABS: 40.0000 mg | ORAL_TABLET | Freq: Every day | ORAL | Status: DC

## 2014-04-04 ENCOUNTER — Ambulatory Visit (INDEPENDENT_AMBULATORY_CARE_PROVIDER_SITE_OTHER): Payer: Medicare Other | Admitting: *Deleted

## 2014-04-04 DIAGNOSIS — D518 Other vitamin B12 deficiency anemias: Secondary | ICD-10-CM

## 2014-04-04 DIAGNOSIS — D519 Vitamin B12 deficiency anemia, unspecified: Secondary | ICD-10-CM

## 2014-04-04 MED ORDER — CYANOCOBALAMIN 1000 MCG/ML IJ SOLN
1000.0000 ug | INTRAMUSCULAR | Status: AC
  Administered 2014-04-04 – 2014-04-24 (×2): 1000 ug via INTRAMUSCULAR

## 2014-04-04 NOTE — Progress Notes (Signed)
Patient ID: [name], female   DOB: May 15, 1956, 58 y.o.   MRN: [id number] Vitamin b12 given and tolerated well

## 2014-04-04 NOTE — Patient Instructions (Signed)
Vitamin B12 Injections Every person needs vitamin B12. A deficiency develops when the body does not get enough of it. One way to overcome this is by getting B12 shots (injections). A B12 shot puts the vitamin directly into muscle tissue. This avoids any problems your body might have in absorbing it from food or a pill. In some people, the body has trouble using the vitamin correctly. This can cause a B12 deficiency. Not consuming enough of the vitamin can also cause a deficiency. Getting enough vitamin B12 can be hard for elderly people. Sometimes, they do not eat a well-balanced diet. The elderly are also more likely than younger people to have medical conditions or take medications that can lead to a deficiency. WHAT DOES VITAMIN B12 DO? Vitamin B12 does many things to help the body work right:  It helps the body make healthy red blood cells.  It helps maintain nerve cells.  It is involved in the body's process of converting food into energy (metabolism).  It is needed to make the genetic material in all cells (DNA). VITAMIN B12 FOOD SOURCES Most people get plenty of vitamin B12 through the foods they eat. It is present in:  Meat, fish, poultry, and eggs.  Milk and milk products.  It also is added when certain foods are made, including some breads, cereals and yogurts. The food is then called "fortified". CAUSES The most common causes of vitamin B12 deficiency are:  Pernicious anemia. The condition develops when the body cannot make enough healthy red blood cells. This stems from a lack of a protein made in the stomach (intrinsic factor). People without this protein cannot absorb enough vitamin B12 from food.  Malabsorption. This is when the body cannot absorb the vitamin. It can be caused by:  Pernicious anemia.  Surgery to remove part or all of the stomach can lead to malabsorption. Removal of part or all of the small intestine can also cause malabsorption.  Vegetarian diet.  People who are strict about not eating foods from animals could have trouble taking in enough vitamin B12 from diet alone.  Medications. Some medicines have been linked to B12 deficiency, such as Metformin (a drug prescribed for type 2 diabetes). Long-term use of stomach acid suppressants also can keep the vitamin from being absorbed.  Intestinal problems such as inflammatory bowel disease. If there are problems in the digestive tract, vitamin B12 may not be absorbed in good enough amounts. SYMPTOMS People who do not get enough B12 can develop problems. These can include:  Anemia. This is when the body has too few red blood cells. Red blood cells carry oxygen to the rest of the body. Without a healthy supply of red blood cells, people can feel:  Tired (fatigued).  Weak.  Severe anemia can cause:  Shortness of breath.  Dizziness.  Rapid heart rate.  Paleness.  Other Vitamin B12 deficiency symptoms include:  Diarrhea.  Numbness or tingling in the hands or feet.  Loss of appetite.  Confusion.  Sores on the tongue or in the mouth. LET YOUR CAREGIVER KNOW ABOUT:  Any allergies. It is very important to know if you are allergic or sensitive to cobalt. Vitamin B12 contains cobalt.  Any history of kidney disease.  All medications you are taking. Include prescription and over-the-counter medicines, herbs and creams.  Whether you are pregnant or breast-feeding.  If you have Leber's disease, a hereditary eye condition, vitamin B12 could make it worse. RISKS AND COMPLICATIONS Reactions to an injection are   usually temporary. They might include:  Pain at the injection site.  Redness, swelling or tenderness at the site.  Headache, dizziness or weakness.  Nausea, upset stomach or diarrhea.  Numbness or tingling.  Fever.  Joint pain.  Itching or rash. If a reaction does not go away in a short while, talk with your healthcare provider. A change in the way the shots are  given, or where they are given, might need to be made. BEFORE AN INJECTION To decide whether B12 injections are right for you, your healthcare provider will probably:  Ask about your medical history.  Ask questions about your diet.  Ask about symptoms such as:  Have you felt weak?  Do you feel unusually tired?  Do you get dizzy?  Order blood tests. These may include a test to:  Check the level of red cells in your blood.  Measure B12 levels.  Check for the presence of intrinsic factor. VITAMIN B12 INJECTIONS How often you will need a vitamin B12 injection will depend on how severe your deficiency is. This also will affect how long you will need to get them. People with pernicious anemia usually get injections for their entire life. Others might get them for a shorter period. For many people, injections are given daily or weekly for several weeks. Then, once B12 levels are normal, injections are given just once a month. If the cause of the deficiency can be fixed, the injections can be stopped. Talk with your healthcare provider about what you should expect. For an injection:  The injection site will be cleaned with an alcohol swab.  Your healthcare provider will insert a needle directly into a muscle. Most any muscle can be used. Most often, an arm muscle is used. A buttocks muscle can also be used. Many people say shots in that area are less painful.  A small adhesive bandage may be put over the injection site. It usually can be taken off in an hour or less. Injections can be given by your healthcare provider. In some cases, family members give them. Sometimes, people give them to themselves. Talk with your healthcare provider about what would be best for you. If someone other than your healthcare provider will be giving the shots, the person will need to be trained to give them correctly. HOME CARE INSTRUCTIONS   You can remove the adhesive bandage within an hour of getting a  shot.  You should be able to go about your normal activities right away.  Avoid drinking large amounts of alcohol while taking vitamin B12 shots. Alcohol can interfere with the body's use of the vitamin. SEEK MEDICAL CARE IF:   Pain, redness, swelling or tenderness at the injection site does not get better or gets worse.  Headache, dizziness or weakness does not go away.  You develop a fever of more than 100.5 F (38.1 C). SEEK IMMEDIATE MEDICAL CARE IF:   You have chest pain.  You develop shortness of breath.  You have muscle weakness that gets worse.  You develop numbness, weakness or tingling on one side or one area of the body.  You have symptoms of an allergic reaction, such as:  Hives.  Difficulty breathing.  Swelling of the lips, face, tongue or throat.  You develop a fever of more than 102.0 F (38.9 C). MAKE SURE YOU:   Understand these instructions.  Will watch your condition.  Will get help right away if you are not doing well or get worse. Document   Released: 10/24/2008 Document Revised: 10/20/2011 Document Reviewed: 10/24/2008 ExitCare Patient Information 2015 [employer]. This information is not intended to replace advice given to you by your health care provider. Make sure you discuss any questions you have with your health care provider.  

## 2014-04-24 ENCOUNTER — Ambulatory Visit (INDEPENDENT_AMBULATORY_CARE_PROVIDER_SITE_OTHER): Payer: Medicare Other | Admitting: *Deleted

## 2014-04-24 DIAGNOSIS — D518 Other vitamin B12 deficiency anemias: Secondary | ICD-10-CM

## 2014-04-24 DIAGNOSIS — D519 Vitamin B12 deficiency anemia, unspecified: Secondary | ICD-10-CM

## 2014-04-24 NOTE — Progress Notes (Signed)
Patient ID: [name], female   DOB: [birth year], 58 y.o.   MRN: [id number] Vitamin b12 given and tolerated well

## 2014-04-24 NOTE — Patient Instructions (Signed)
Vitamin B12 Injections Every person needs vitamin B12. A deficiency develops when the body does not get enough of it. One way to overcome this is by getting B12 shots (injections). A B12 shot puts the vitamin directly into muscle tissue. This avoids any problems your body might have in absorbing it from food or a pill. In some people, the body has trouble using the vitamin correctly. This can cause a B12 deficiency. Not consuming enough of the vitamin can also cause a deficiency. Getting enough vitamin B12 can be hard for elderly people. Sometimes, they do not eat a well-balanced diet. The elderly are also more likely than younger people to have medical conditions or take medications that can lead to a deficiency. WHAT DOES VITAMIN B12 DO? Vitamin B12 does many things to help the body work right:  It helps the body make healthy red blood cells.  It helps maintain nerve cells.  It is involved in the body's process of converting food into energy (metabolism).  It is needed to make the genetic material in all cells (DNA). VITAMIN B12 FOOD SOURCES Most people get plenty of vitamin B12 through the foods they eat. It is present in:  Meat, fish, poultry, and eggs.  Milk and milk products.  It also is added when certain foods are made, including some breads, cereals and yogurts. The food is then called "fortified". CAUSES The most common causes of vitamin B12 deficiency are:  Pernicious anemia. The condition develops when the body cannot make enough healthy red blood cells. This stems from a lack of a protein made in the stomach (intrinsic factor). People without this protein cannot absorb enough vitamin B12 from food.  Malabsorption. This is when the body cannot absorb the vitamin. It can be caused by:  Pernicious anemia.  Surgery to remove part or all of the stomach can lead to malabsorption. Removal of part or all of the small intestine can also cause malabsorption.  Vegetarian diet.  People who are strict about not eating foods from animals could have trouble taking in enough vitamin B12 from diet alone.  Medications. Some medicines have been linked to B12 deficiency, such as Metformin (a drug prescribed for type 2 diabetes). Long-term use of stomach acid suppressants also can keep the vitamin from being absorbed.  Intestinal problems such as inflammatory bowel disease. If there are problems in the digestive tract, vitamin B12 may not be absorbed in good enough amounts. SYMPTOMS People who do not get enough B12 can develop problems. These can include:  Anemia. This is when the body has too few red blood cells. Red blood cells carry oxygen to the rest of the body. Without a healthy supply of red blood cells, people can feel:  Tired (fatigued).  Weak.  Severe anemia can cause:  Shortness of breath.  Dizziness.  Rapid heart rate.  Paleness.  Other Vitamin B12 deficiency symptoms include:  Diarrhea.  Numbness or tingling in the hands or feet.  Loss of appetite.  Confusion.  Sores on the tongue or in the mouth. LET YOUR CAREGIVER KNOW ABOUT:  Any allergies. It is very important to know if you are allergic or sensitive to cobalt. Vitamin B12 contains cobalt.  Any history of kidney disease.  All medications you are taking. Include prescription and over-the-counter medicines, herbs and creams.  Whether you are pregnant or breast-feeding.  If you have Leber's disease, a hereditary eye condition, vitamin B12 could make it worse. RISKS AND COMPLICATIONS Reactions to an injection are   usually temporary. They might include:  Pain at the injection site.  Redness, swelling or tenderness at the site.  Headache, dizziness or weakness.  Nausea, upset stomach or diarrhea.  Numbness or tingling.  Fever.  Joint pain.  Itching or rash. If a reaction does not go away in a short while, talk with your healthcare provider. A change in the way the shots are  given, or where they are given, might need to be made. BEFORE AN INJECTION To decide whether B12 injections are right for you, your healthcare provider will probably:  Ask about your medical history.  Ask questions about your diet.  Ask about symptoms such as:  Have you felt weak?  Do you feel unusually tired?  Do you get dizzy?  Order blood tests. These may include a test to:  Check the level of red cells in your blood.  Measure B12 levels.  Check for the presence of intrinsic factor. VITAMIN B12 INJECTIONS How often you will need a vitamin B12 injection will depend on how severe your deficiency is. This also will affect how long you will need to get them. People with pernicious anemia usually get injections for their entire life. Others might get them for a shorter period. For many people, injections are given daily or weekly for several weeks. Then, once B12 levels are normal, injections are given just once a month. If the cause of the deficiency can be fixed, the injections can be stopped. Talk with your healthcare provider about what you should expect. For an injection:  The injection site will be cleaned with an alcohol swab.  Your healthcare provider will insert a needle directly into a muscle. Most any muscle can be used. Most often, an arm muscle is used. A buttocks muscle can also be used. Many people say shots in that area are less painful.  A small adhesive bandage may be put over the injection site. It usually can be taken off in an hour or less. Injections can be given by your healthcare provider. In some cases, family members give them. Sometimes, people give them to themselves. Talk with your healthcare provider about what would be best for you. If someone other than your healthcare provider will be giving the shots, the person will need to be trained to give them correctly. HOME CARE INSTRUCTIONS   You can remove the adhesive bandage within an hour of getting a  shot.  You should be able to go about your normal activities right away.  Avoid drinking large amounts of alcohol while taking vitamin B12 shots. Alcohol can interfere with the body's use of the vitamin. SEEK MEDICAL CARE IF:   Pain, redness, swelling or tenderness at the injection site does not get better or gets worse.  Headache, dizziness or weakness does not go away.  You develop a fever of more than 100.5 F (38.1 C). SEEK IMMEDIATE MEDICAL CARE IF:   You have chest pain.  You develop shortness of breath.  You have muscle weakness that gets worse.  You develop numbness, weakness or tingling on one side or one area of the body.  You have symptoms of an allergic reaction, such as:  Hives.  Difficulty breathing.  Swelling of the lips, face, tongue or throat.  You develop a fever of more than 102.0 F (38.9 C). MAKE SURE YOU:   Understand these instructions.  Will watch your condition.  Will get help right away if you are not doing well or get worse. Document   Released: 10/24/2008 Document Revised: 10/20/2011 Document Reviewed: 10/24/2008 ExitCare Patient Information 2015 [employer]. This information is not intended to replace advice given to you by your health care provider. Make sure you discuss any questions you have with your health care provider.  

## 2014-05-22 ENCOUNTER — Ambulatory Visit (INDEPENDENT_AMBULATORY_CARE_PROVIDER_SITE_OTHER): Payer: Medicare Other

## 2014-05-22 DIAGNOSIS — E538 Deficiency of other specified B group vitamins: Secondary | ICD-10-CM

## 2014-05-22 MED ORDER — CYANOCOBALAMIN 1000 MCG/ML IJ SOLN
1000.0000 ug | INTRAMUSCULAR | Status: DC
  Administered 2014-05-22 – 2015-01-10 (×8): 1000 ug via INTRAMUSCULAR

## 2014-05-26 ENCOUNTER — Other Ambulatory Visit: Payer: Self-pay

## 2014-06-26 ENCOUNTER — Encounter: Payer: Self-pay | Admitting: Nurse Practitioner

## 2014-06-26 ENCOUNTER — Ambulatory Visit (INDEPENDENT_AMBULATORY_CARE_PROVIDER_SITE_OTHER): Payer: Medicare Other | Admitting: Nurse Practitioner

## 2014-06-26 VITALS — BP 119/68 | HR 63 | Temp 97.8°F | Ht 70.0 in | Wt 212.0 lb

## 2014-06-26 DIAGNOSIS — E782 Mixed hyperlipidemia: Secondary | ICD-10-CM

## 2014-06-26 DIAGNOSIS — E119 Type 2 diabetes mellitus without complications: Secondary | ICD-10-CM

## 2014-06-26 DIAGNOSIS — D519 Vitamin B12 deficiency anemia, unspecified: Secondary | ICD-10-CM

## 2014-06-26 DIAGNOSIS — Z6831 Body mass index (BMI) 31.0-31.9, adult: Secondary | ICD-10-CM

## 2014-06-26 DIAGNOSIS — E1142 Type 2 diabetes mellitus with diabetic polyneuropathy: Secondary | ICD-10-CM

## 2014-06-26 DIAGNOSIS — K219 Gastro-esophageal reflux disease without esophagitis: Secondary | ICD-10-CM

## 2014-06-26 DIAGNOSIS — E538 Deficiency of other specified B group vitamins: Secondary | ICD-10-CM

## 2014-06-26 DIAGNOSIS — G35 Multiple sclerosis: Secondary | ICD-10-CM

## 2014-06-26 LAB — POCT UA - MICROALBUMIN: Microalbumin Ur, POC: 20 mg/L

## 2014-06-26 LAB — POCT GLYCOSYLATED HEMOGLOBIN (HGB A1C): Hemoglobin A1C: 5.9

## 2014-06-26 NOTE — Patient Instructions (Signed)
Diabetes and Exercise Exercising regularly is important. It is not just about losing weight. It has many health benefits, such as:  Improving your overall fitness, flexibility, and endurance.  Increasing your bone density.  Helping with weight control.  Decreasing your body fat.  Increasing your muscle strength.  Reducing stress and tension.  Improving your overall health. People with diabetes who exercise gain additional benefits because exercise:  Reduces appetite.  Improves the body's use of blood sugar (glucose).  Helps lower or control blood glucose.  Decreases blood pressure.  Helps control blood lipids (such as cholesterol and triglycerides).  Improves the body's use of the hormone insulin by:  Increasing the body's insulin sensitivity.  Reducing the body's insulin needs.  Decreases the risk for heart disease because exercising:  Lowers cholesterol and triglycerides levels.  Increases the levels of good cholesterol (such as high-density lipoproteins [HDL]) in the body.  Lowers blood glucose levels. YOUR ACTIVITY PLAN  Choose an activity that you enjoy and set realistic goals. Your health care provider or diabetes educator can help you make an activity plan that works for you. Exercise regularly as directed by your health care provider. This includes:  Performing resistance training twice a week such as push-ups, sit-ups, lifting weights, or using resistance bands.  Performing 150 minutes of cardio exercises each week such as walking, running, or playing sports.  Staying active and spending no more than 90 minutes at one time being inactive. Even short bursts of exercise are good for you. Three 10-minute sessions spread throughout the day are just as beneficial as a single 30-minute session. Some exercise ideas include:  Taking the dog for a walk.  Taking the stairs instead of the elevator.  Dancing to your favorite song.  Doing an exercise  video.  Doing your favorite exercise with a friend. RECOMMENDATIONS FOR EXERCISING WITH TYPE 1 OR TYPE 2 DIABETES   Check your blood glucose before exercising. If blood glucose levels are greater than 240 mg/dL, check for urine ketones. Do not exercise if ketones are present.  Avoid injecting insulin into areas of the body that are going to be exercised. For example, avoid injecting insulin into:  The arms when playing tennis.  The legs when jogging.  Keep a record of:  Food intake before and after you exercise.  Expected peak times of insulin action.  Blood glucose levels before and after you exercise.  The type and amount of exercise you have done.  Review your records with your health care provider. Your health care provider will help you to develop guidelines for adjusting food intake and insulin amounts before and after exercising.  If you take insulin or oral hypoglycemic agents, watch for signs and symptoms of hypoglycemia. They include:  Dizziness.  Shaking.  Sweating.  Chills.  Confusion.  Drink plenty of water while you exercise to prevent dehydration or heat stroke. Body water is lost during exercise and must be replaced.  Talk to your health care provider before starting an exercise program to make sure it is safe for you. Remember, almost any type of activity is better than none. Document Released: 10/18/2003 Document Revised: 12/12/2013 Document Reviewed: 01/04/2013 ExitCare Patient Information 2015 [employer]. This information is not intended to replace advice given to you by your health care provider. Make sure you discuss any questions you have with your health care provider.  

## 2014-06-26 NOTE — Progress Notes (Signed)
Subjective:    Patient ID: [name], female    DOB: 1956/05/22, 58 y.o.   MRN: [id number]  Patient here today for follow up of chronic medical problems. No acute problem.   Hyperlipidemia This is a chronic problem. The current episode started more than 1 year ago. Recent lipid tests were reviewed and are high. Pertinent negatives include no chest pain, myalgias or shortness of breath. Risk factors for coronary artery disease include diabetes mellitus.  Diabetes She presents for her follow-up diabetic visit. She has type 2 diabetes mellitus. Pertinent negatives for diabetes include no chest pain, no polydipsia, no polyphagia, no polyuria, no visual change and no weakness. Symptoms are stable. Risk factors for coronary artery disease include diabetes mellitus, dyslipidemia, hypertension and post-menopausal. She is compliant with treatment all of the time. Her weight is stable. There is no change in her home blood glucose trend.  MS Sees Dr Bjorn Loser in [place] every 6 months- currently under control GERD omerprazole daily- keeps symptom sunder control bil foot neuropathy Gabapentin helps with bruning senastion- no c/ side effects COPD  No maintenance inhaler- only ues albuterol as needed- hasn't needed in about 2 months B 12 def Found at last visit- has been getting injections weekly for the  Last month- once a month B12 injection.   *she reports seeing an orthopedic doctor for her neck, she will start viseral injection on 11/25.   Review of Systems  Respiratory: Negative for shortness of breath.   Cardiovascular: Negative for chest pain.  Endocrine: Negative for polydipsia, polyphagia and polyuria.  Musculoskeletal: Negative for myalgias.  Neurological: Negative for weakness.  All other systems reviewed and are negative.      Objective:   Physical Exam  Constitutional: She is oriented to person, place, and time. She appears well-developed and well-nourished.  HENT:  Head:  Normocephalic and atraumatic.  Eyes: Pupils are equal, round, and reactive to light.  Neck: Normal range of motion.  Cardiovascular: Normal rate and regular rhythm.  Exam reveals no gallop and no friction rub.   No murmur heard. Pulmonary/Chest: Effort normal and breath sounds normal. No respiratory distress. She has no wheezes. She has no rales. She exhibits no tenderness.  Abdominal: Soft.  Neurological: She is alert and oriented to person, place, and time. She has normal reflexes.  Skin: Skin is warm.  Psychiatric: She has a normal mood and affect. Her behavior is normal. Judgment and thought content normal.    BP 119/68 mmHg  Pulse 63  Temp(Src) 97.8 F (36.6 C) (Oral)  Ht _0  (1.778 m)  Wt 212 lb (96.163 kg)  BMI 30.42 kg/m2  Results for orders placed or performed in visit on 06/26/14  POCT glycosylated hemoglobin (Hb A1C)  Result Value Ref Range   Hemoglobin A1C 5.9   POCT UA - Microalbumin  Result Value Ref Range   Microalbumin Ur, POC 20 mg/L          Assessment & Plan:   1. Type 2 diabetes mellitus without complication Continue carb counting - POCT glycosylated hemoglobin (Hb A1C) - CMP14+EGFR - POCT UA - Microalbumin - Microalbumin, urine  2. Mixed hyperlipidemia Low fat diet - NMR, lipoprofile  3. Multiple sclerosis Keep follow up with specialist  4. Diabetic polyneuropathy associated with type 2 diabetes mellitus Diabetic foot care 5. Gastroesophageal reflux disease without esophagitis Avoid spicy and fatty foods  6. BMI 31.0-31.9,adult Discussed diet and exercise for person with BMI >25 Will recheck weight in 3-6 months  7. B12 deficiency anemia - Vitamin B12   Patient was in a hurry today- will give hemocult cards at next visit- will get tetanus shot at next visit. Labs pending Health maintenance reviewed Diet and exercise encouraged Continue all meds Follow up  In 3 months   [place], FNP

## 2014-06-27 LAB — NMR, LIPOPROFILE
Cholesterol: 230 mg/dL — ABNORMAL HIGH (ref 100–199)
HDL Cholesterol by NMR: 47 mg/dL (ref >39)
HDL Particle Number: 31.3 umol/L (ref >=30.5)
LDL PARTICLE NUMBER: 1733 nmol/L — AB (ref <1000)
LDL Size: 21 nm (ref >20.5)
LDL-C: 160 mg/dL — ABNORMAL HIGH (ref 0–99)
LP-IR Score: 62 — ABNORMAL HIGH (ref <=45)
SMALL LDL PARTICLE NUMBER: 357 nmol/L (ref <=527)
Triglycerides by NMR: 116 mg/dL (ref 0–149)

## 2014-06-27 LAB — CMP14+EGFR
A/G RATIO: 1.8 (ref 1.1–2.5)
ALK PHOS: 72 IU/L (ref 39–117)
ALT: 11 IU/L (ref 0–32)
AST: 12 IU/L (ref 0–40)
Albumin: 4 g/dL (ref 3.5–5.5)
BUN/Creatinine Ratio: 17 (ref 9–23)
BUN: 12 mg/dL (ref 6–24)
CHLORIDE: 102 mmol/L (ref 97–108)
CO2: 25 mmol/L (ref 18–29)
Calcium: 8.9 mg/dL (ref 8.7–10.2)
Creatinine, Ser: 0.71 mg/dL (ref 0.57–1.00)
GFR calc Af Amer: 110 mL/min/{1.73_m2} (ref >59)
GFR calc non Af Amer: 96 mL/min/{1.73_m2} (ref >59)
GLUCOSE: 102 mg/dL — AB (ref 65–99)
Globulin, Total: 2.2 g/dL (ref 1.5–4.5)
POTASSIUM: 4.2 mmol/L (ref 3.5–5.2)
Sodium: 143 mmol/L (ref 134–144)
Total Bilirubin: 0.4 mg/dL (ref 0.0–1.2)
Total Protein: 6.2 g/dL (ref 6.0–8.5)

## 2014-06-27 LAB — VITAMIN B12: Vitamin B-12: 623 pg/mL (ref 211–946)

## 2014-06-27 LAB — MICROALBUMIN, URINE: MICROALBUM., U, RANDOM: 7.9 ug/mL (ref 0.0–17.0)

## 2014-06-30 ENCOUNTER — Telehealth: Payer: Self-pay

## 2014-06-30 NOTE — Telephone Encounter (Signed)
-----   Message from [facility] sent at 06/27/2014  3:47 PM EST ----- Hgba1c discussed at appointment microalbimun good Kidney and liver function stable ldl particle numbers and LDL ar elevated- really needs to be on a statin- will agree to take? B12 normal Continue current meds- low fat diet and exercise and recheck in 3 months

## 2014-06-30 NOTE — Telephone Encounter (Signed)
Pt aware of lab results; Does not want to take a statin

## 2014-07-24 ENCOUNTER — Ambulatory Visit (INDEPENDENT_AMBULATORY_CARE_PROVIDER_SITE_OTHER): Payer: Medicare Other | Admitting: *Deleted

## 2014-07-24 DIAGNOSIS — D519 Vitamin B12 deficiency anemia, unspecified: Secondary | ICD-10-CM

## 2014-07-24 DIAGNOSIS — E538 Deficiency of other specified B group vitamins: Secondary | ICD-10-CM

## 2014-07-24 NOTE — Patient Instructions (Signed)

## 2014-07-24 NOTE — Progress Notes (Signed)
Pt given b12 injection IM left deltoid. Pt tolerated injection well.

## 2014-08-25 ENCOUNTER — Ambulatory Visit (INDEPENDENT_AMBULATORY_CARE_PROVIDER_SITE_OTHER): Payer: Medicare Other | Admitting: *Deleted

## 2014-08-25 DIAGNOSIS — D519 Vitamin B12 deficiency anemia, unspecified: Secondary | ICD-10-CM

## 2014-08-25 DIAGNOSIS — E538 Deficiency of other specified B group vitamins: Secondary | ICD-10-CM

## 2014-08-25 NOTE — Patient Instructions (Signed)

## 2014-08-25 NOTE — Progress Notes (Signed)
Pt given B12 injection IM right deltoid, pt tolerated injection well. 

## 2014-09-06 ENCOUNTER — Other Ambulatory Visit: Payer: Self-pay | Admitting: Orthopedic Surgery

## 2014-09-06 DIAGNOSIS — M1712 Unilateral primary osteoarthritis, left knee: Secondary | ICD-10-CM

## 2014-09-11 ENCOUNTER — Ambulatory Visit
Admission: RE | Admit: 2014-09-11 | Discharge: 2014-09-11 | Disposition: A | Discharge status: Home / Self Care | Payer: Medicare Other | Source: Ambulatory Visit | Attending: Orthopedic Surgery | Admitting: Orthopedic Surgery

## 2014-09-11 DIAGNOSIS — M1712 Unilateral primary osteoarthritis, left knee: Secondary | ICD-10-CM

## 2014-09-14 ENCOUNTER — Telehealth: Payer: Self-pay | Admitting: Nurse Practitioner

## 2014-09-14 DIAGNOSIS — E119 Type 2 diabetes mellitus without complications: Secondary | ICD-10-CM

## 2014-09-14 MED ORDER — GABAPENTIN 600 MG PO TABS: 600.0000 mg | ORAL_TABLET | Freq: Four times a day (QID) | ORAL | Status: DC

## 2014-09-14 MED ORDER — METFORMIN HCL 500 MG PO TABS: 500.0000 mg | ORAL_TABLET | Freq: Every day | ORAL | Status: DC

## 2014-09-14 MED ORDER — OMEPRAZOLE 20 MG PO CPDR: 20.0000 mg | DELAYED_RELEASE_CAPSULE | Freq: Every day | ORAL | Status: DC

## 2014-09-14 NOTE — Telephone Encounter (Signed)
done

## 2014-09-26 ENCOUNTER — Ambulatory Visit (INDEPENDENT_AMBULATORY_CARE_PROVIDER_SITE_OTHER): Payer: Medicare Other | Admitting: *Deleted

## 2014-09-26 DIAGNOSIS — E538 Deficiency of other specified B group vitamins: Secondary | ICD-10-CM

## 2014-09-26 DIAGNOSIS — D519 Vitamin B12 deficiency anemia, unspecified: Secondary | ICD-10-CM

## 2014-09-26 NOTE — Patient Instructions (Signed)

## 2014-09-26 NOTE — Progress Notes (Signed)
Vitamin b12 injection given and tolerated well.  

## 2014-10-02 ENCOUNTER — Encounter: Payer: Self-pay | Admitting: Nurse Practitioner

## 2014-10-02 ENCOUNTER — Ambulatory Visit (INDEPENDENT_AMBULATORY_CARE_PROVIDER_SITE_OTHER): Payer: Medicare Other | Admitting: Nurse Practitioner

## 2014-10-02 VITALS — BP 137/74 | HR 67 | Temp 97.8°F | Ht 70.0 in | Wt 219.0 lb

## 2014-10-02 DIAGNOSIS — Z01818 Encounter for other preprocedural examination: Secondary | ICD-10-CM | POA: Diagnosis not present

## 2014-10-02 DIAGNOSIS — E782 Mixed hyperlipidemia: Secondary | ICD-10-CM | POA: Diagnosis not present

## 2014-10-02 DIAGNOSIS — E119 Type 2 diabetes mellitus without complications: Secondary | ICD-10-CM

## 2014-10-02 LAB — POCT GLYCOSYLATED HEMOGLOBIN (HGB A1C): HEMOGLOBIN A1C: 5.9

## 2014-10-02 NOTE — Patient Instructions (Signed)
Exercise to Lose Weight Exercise and a healthy diet may help you lose weight. Your doctor may suggest specific exercises. EXERCISE IDEAS AND TIPS  Choose low-cost things you enjoy doing, such as walking, bicycling, or exercising to workout videos.  Take stairs instead of the elevator.  Walk during your lunch break.  Park your car further away from work or school.  Go to a gym or an exercise class.  Start with 5 to 10 minutes of exercise each day. Build up to 30 minutes of exercise 4 to 6 days a week.  Wear shoes with good support and comfortable clothes.  Stretch before and after working out.  Work out until you breathe harder and your heart beats faster.  Drink extra water when you exercise.  Do not do so much that you hurt yourself, feel dizzy, or get very short of breath. Exercises that burn about 150 calories:  Running 1  miles in 15 minutes.  Playing volleyball for 45 to 60 minutes.  Washing and waxing a car for 45 to 60 minutes.  Playing touch football for 45 minutes.  Walking 1  miles in 35 minutes.  Pushing a stroller 1  miles in 30 minutes.  Playing basketball for 30 minutes.  Raking leaves for 30 minutes.  Bicycling 5 miles in 30 minutes.  Walking 2 miles in 30 minutes.  Dancing for 30 minutes.  Shoveling snow for 15 minutes.  Swimming laps for 20 minutes.  Walking up stairs for 15 minutes.  Bicycling 4 miles in 15 minutes.  Gardening for 30 to 45 minutes.  Jumping rope for 15 minutes.  Washing windows or floors for 45 to 60 minutes. Document Released: 08/30/2010 Document Revised: 10/20/2011 Document Reviewed: 08/30/2010 ExitCare Patient Information 2015 [employer]. This information is not intended to replace advice given to you by your health care provider. Make sure you discuss any questions you have with your health care provider.  

## 2014-10-02 NOTE — Progress Notes (Signed)
Subjective:    Patient ID: [name], female    DOB: August 25, 1955, 59 y.o.   MRN: [id number]  Patient here today for follow up of chronic medical problems. No acute problem. Needs surgical clearance for [facility] orthopedics.  Hyperlipidemia This is a chronic problem. The current episode started more than 1 year ago. Recent lipid tests were reviewed and are high. Pertinent negatives include no chest pain, myalgias or shortness of breath. Current antihyperlipidemic treatment includes statins. The current treatment provides moderate improvement of lipids. Compliance problems include adherence to diet and adherence to exercise.  Risk factors for coronary artery disease include diabetes mellitus.  Diabetes She presents for her follow-up diabetic visit. She has type 2 diabetes mellitus. There are no hypoglycemic associated symptoms. Pertinent negatives for diabetes include no chest pain, no polydipsia, no polyphagia, no polyuria, no visual change and no weakness. There are no hypoglycemic complications. There are no diabetic complications. Risk factors for coronary artery disease include diabetes mellitus, dyslipidemia, hypertension and post-menopausal. She is compliant with treatment all of the time. Her weight is stable. She is following a generally healthy diet. When asked about meal planning, she reported none. She has not had a previous visit with a dietitian. She rarely participates in exercise. There is no change in her home blood glucose trend. Her breakfast blood glucose is taken between 9-10 am. Her breakfast blood glucose range is generally 130-140 mg/dl. An ACE inhibitor/angiotensin II receptor blocker is not being taken. She does not see a podiatrist.Eye exam is not current.  MS Sees Dr Bjorn Loser in [place] every 6 months- currently under control GERD omerprazole daily- keeps symptom sunder control bil foot neuropathy Gabapentin helps with bruning senastion- no c/ side effects COPD  No  maintenance inhaler- only ues albuterol as needed- hasn't needed in about 2 months B 12 def Found at last visit- has been getting injections weekly for the  Last month- once a month B12 injection.     Review of Systems  Respiratory: Negative for shortness of breath.   Cardiovascular: Negative for chest pain.  Endocrine: Negative for polydipsia, polyphagia and polyuria.  Musculoskeletal: Negative for myalgias.  Neurological: Negative for weakness.  All other systems reviewed and are negative.      Objective:   Physical Exam  Constitutional: She is oriented to person, place, and time. She appears well-developed and well-nourished.  HENT:  Head: Normocephalic and atraumatic.  Eyes: Pupils are equal, round, and reactive to light.  Neck: Normal range of motion.  Cardiovascular: Normal rate and regular rhythm.  Exam reveals no gallop and no friction rub.   No murmur heard. Pulmonary/Chest: Effort normal and breath sounds normal. No respiratory distress. She has no wheezes. She has no rales. She exhibits no tenderness.  Abdominal: Soft.  Neurological: She is alert and oriented to person, place, and time. She has normal reflexes.  Skin: Skin is warm.  Psychiatric: She has a normal mood and affect. Her behavior is normal. Judgment and thought content normal.    BP 137/74 mmHg  Pulse 67  Temp(Src) 97.8 F (36.6 C) (Oral)  Ht 5' 10"  (1.778 m)  Wt 219 lb (99.338 kg)  BMI 31.42 kg/m2   Results for orders placed or performed in visit on 10/02/14  POCT glycosylated hemoglobin (Hb A1C)  Result Value Ref Range   Hemoglobin A1C 5.9%     EKG-NSr- unchanged from Larae Grooms, FNP        Assessment & Plan:   1. Type 2  diabetes mellitus without complication Carb counting encouraged - POCT glycosylated hemoglobin (Hb A1C) - CMP14+EGFR  2. Mixed hyperlipidemia Low fat diet - NMR, lipoprofile  3. Preoperative examination - EKG 12-Lead   hemoccult cards  given to patient with directions Labs pending Health maintenance reviewed Diet and exercise encouraged Continue all meds Follow up  In 3 month   [place], FNP

## 2014-10-03 LAB — NMR, LIPOPROFILE
Cholesterol: 233 mg/dL — ABNORMAL HIGH (ref 100–199)
HDL CHOLESTEROL BY NMR: 51 mg/dL (ref >39)
HDL PARTICLE NUMBER: 31 umol/L (ref >=30.5)
LDL Particle Number: 1701 nmol/L — ABNORMAL HIGH (ref <1000)
LDL SIZE: 20.9 nm (ref >20.5)
LDL-C: 158 mg/dL — ABNORMAL HIGH (ref 0–99)
LP-IR Score: 55 — ABNORMAL HIGH (ref <=45)
SMALL LDL PARTICLE NUMBER: 781 nmol/L — AB (ref <=527)
Triglycerides by NMR: 120 mg/dL (ref 0–149)

## 2014-10-03 LAB — CMP14+EGFR
ALT: 11 IU/L (ref 0–32)
AST: 17 IU/L (ref 0–40)
Albumin/Globulin Ratio: 1.9 (ref 1.1–2.5)
Albumin: 3.7 g/dL (ref 3.5–5.5)
Alkaline Phosphatase: 68 IU/L (ref 39–117)
BUN/Creatinine Ratio: 11 (ref 9–23)
BUN: 8 mg/dL (ref 6–24)
Bilirubin Total: 0.4 mg/dL (ref 0.0–1.2)
CO2: 25 mmol/L (ref 18–29)
Calcium: 8.7 mg/dL (ref 8.7–10.2)
Chloride: 102 mmol/L (ref 97–108)
Creatinine, Ser: 0.72 mg/dL (ref 0.57–1.00)
GFR calc Af Amer: 108 mL/min/1.73
GFR calc non Af Amer: 94 mL/min/1.73
Globulin, Total: 1.9 g/dL (ref 1.5–4.5)
Glucose: 104 mg/dL — ABNORMAL HIGH (ref 65–99)
Potassium: 4.3 mmol/L (ref 3.5–5.2)
Sodium: 143 mmol/L (ref 134–144)
Total Protein: 5.6 g/dL — ABNORMAL LOW (ref 6.0–8.5)

## 2014-10-20 ENCOUNTER — Telehealth: Payer: Self-pay | Admitting: Nurse Practitioner

## 2014-10-20 ENCOUNTER — Telehealth: Payer: Medicare Other | Admitting: Family

## 2014-10-20 DIAGNOSIS — J069 Acute upper respiratory infection, unspecified: Secondary | ICD-10-CM

## 2014-10-20 DIAGNOSIS — R059 Cough, unspecified: Secondary | ICD-10-CM

## 2014-10-20 DIAGNOSIS — R05 Cough: Secondary | ICD-10-CM

## 2014-10-20 MED ORDER — DOXYCYCLINE HYCLATE 100 MG PO TABS: 100.0000 mg | ORAL_TABLET | Freq: Two times a day (BID) | ORAL | Status: DC

## 2014-10-20 MED ORDER — BENZONATATE 200 MG PO CAPS: 200.0000 mg | ORAL_CAPSULE | Freq: Three times a day (TID) | ORAL | Status: DC | PRN

## 2014-10-20 NOTE — Telephone Encounter (Signed)
Patient advised about e-visits and will use that tonight.

## 2014-10-20 NOTE — Progress Notes (Signed)
We are sorry that you are not feeling well.  Here is how we plan to help!  Based on what you have shared with me it looks like you have upper respiratory tract inflammation that has resulted in a significant cough.  Inflammation and infection in the upper respiratory tract is commonly called bronchitis and has four common causes:  Allergies, Viral Infections, Acid Reflux and Bacterial Infections.  Allergies, viruses and acid reflux are treated by controlling symptoms or eliminating the cause. An example might be a cough caused by taking certain blood pressure medications. You stop the cough by changing the medication. Another example might be a cough caused by acid reflux. Controlling the reflux helps control the cough.  Based on your presentation I believe you most likely have A cough due to bacteria.  When patients have a fever and a productive cough with a change in color or increased sputum production, we are concerned about bacterial bronchitis.  If left untreated it can progress to pneumonia.  If your symptoms do not improve with your treatment plan it is important that you contact your provider.   I hve prescribed Doxycycline 100 mg twice a day for 7 days    In addition you may use A non-prescription cough medication called Robitussin DAC. Take 2 teaspoons every 8 hours or Delsym: take 2 teaspoons every 12 hours., A non-prescription cough medication called Mucinex DM: take 2 tablets every 12 hours. and A prescription cough medication called Tessalon Perles 100mg . You may take 1-2 capsules every 8 hours as needed for your cough.    HOME CARE . Only take medications as instructed by your medical team. . Complete the entire course of an antibiotic. . Drink plenty of fluids and get plenty of rest. . Avoid close contacts especially the very young and the elderly . Cover your mouth if you cough or cough into your sleeve. . Always remember to wash your hands . A steam or ultrasonic humidifier can help  congestion.    GET HELP RIGHT AWAY IF: . You develop worsening fever. . You become short of breath . You cough up blood. . Your symptoms persist after you have completed your treatment plan MAKE SURE YOU   Understand these instructions.  Will watch your condition.  Will get help right away if you are not doing well or get worse.  Your e-visit answers were reviewed by a board certified advanced clinical practitioner to complete your personal care plan.  Depending on the condition, your plan could have included both over the counter or prescription medications.  If there is a problem please reply  once you have received a response from your provider.  Your safety is important to [place].  If you have drug allergies check your prescription carefully.    You can use MyChart to ask questions about today's visit, request a non-urgent call back, or ask for a work or school excuse.  You will get an e-mail in the next two days asking about your experience.  I hope that your e-visit has been valuable and will speed your recovery. Thank you for using e-visits.

## 2014-10-26 ENCOUNTER — Ambulatory Visit (INDEPENDENT_AMBULATORY_CARE_PROVIDER_SITE_OTHER): Payer: Medicare Other | Admitting: *Deleted

## 2014-10-26 DIAGNOSIS — E538 Deficiency of other specified B group vitamins: Secondary | ICD-10-CM

## 2014-10-26 DIAGNOSIS — D519 Vitamin B12 deficiency anemia, unspecified: Secondary | ICD-10-CM

## 2014-10-26 DIAGNOSIS — Z1212 Encounter for screening for malignant neoplasm of rectum: Secondary | ICD-10-CM

## 2014-10-26 NOTE — Progress Notes (Signed)
Vitamin b12 injection given and tolerated well.  

## 2014-10-26 NOTE — Patient Instructions (Signed)

## 2014-10-28 LAB — FECAL OCCULT BLOOD, IMMUNOCHEMICAL: FECAL OCCULT BLD: NEGATIVE

## 2014-11-27 ENCOUNTER — Ambulatory Visit (INDEPENDENT_AMBULATORY_CARE_PROVIDER_SITE_OTHER): Payer: Medicare Other | Admitting: *Deleted

## 2014-11-27 DIAGNOSIS — E538 Deficiency of other specified B group vitamins: Secondary | ICD-10-CM | POA: Diagnosis not present

## 2014-11-27 DIAGNOSIS — D519 Vitamin B12 deficiency anemia, unspecified: Secondary | ICD-10-CM

## 2014-11-27 NOTE — Progress Notes (Signed)
Pt given B12 injection IM left deltoid and tolerated well. °

## 2014-11-27 NOTE — Patient Instructions (Signed)

## 2014-12-26 ENCOUNTER — Ambulatory Visit: Payer: Self-pay | Admitting: Orthopedic Surgery

## 2014-12-26 NOTE — H&P (Signed)
Willaim Rodriguez DOB: 06-10-1956 Married / Language: English / Race: White Female Date of Admission: 01/22/2015 CC: Left Knee Pain History of Present Illness The patient is a 59 year old female who comes in for a preoperative History and Physical. The patient is scheduled for a left uni knee replacement to be performed by Dr. Dione Rodriguez. Aluisio, MD at [facility] on 01-22-2015. The patient is a 59 year old female who presents today for follow up of their knee. The patient is being followed for their left knee pain and osteoarthritis. They are now 6 week(s) out from Euflexxa series. Symptoms reported today include: pain, aching and difficulty ambulating. The patient feels that they are doing poorly and report their pain level to be mild to moderate. Current treatment includes: relative rest. The patient has reported improvement of their symptoms with: viscosupplementation (helped some, but not a significant improvement). She has considerable pain along the medial aspect of that left knee. The Euflexxa did not provide a tremendous amount of benefit. It is getting harder for her to get around and do things she desires. She has significant medial compartment arthritis with really worsening symptoms. At this point now the most predictable means of improving her pain and function would be arthroplasty. I feel she is an excellent candidate for unicompartmental replacement as everything is isolated to the medial side, she is young, she wants to be more active, and has intact ligaments. I recommend going ahead and doing a unicompartmental replacement. We discussed this in detail, including differences between a unicompartmental and a total knee. She would like to go ahead and proceed with the unicompartmental replacement. They have been treated conservatively in the past for the above stated problem and despite conservative measures, they continue to have progressive pain and severe functional limitations  and dysfunction. They have failed non-operative management including home exercise, medications, and injections. It is felt that they would benefit from undergoing total joint replacement. Risks and benefits of the procedure have been discussed with the patient and they elect to proceed with surgery. There are no active contraindications to surgery such as ongoing infection or rapidly progressive neurological disease.  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) Primary osteoarthritis of left knee (M17.12) Peripheral Neuropathy Skin Cancer Sleep Apnea Achilles tendinitis (M76.60) Tight heel cords, acquired (M67.00) Chronic Obstructive Lung Disease Migraine Headache Osteoarthritis Asthma Bronchitis Menopause Multiple Sclerosis COPD Migraine Headache Anxiety Disorder Depression Hypercholesterolemia Hiatal Hernia Gastroesophageal Reflux Disease Hemorrhoids Non-Insulin Dependent Diabetes Mellitus Measles Mumps  Allergies Penicillin V *PENICILLINS* Mouth Ulcers Statins Flu-like symptoms and aches  Family History Chronic Obstructive Lung Disease father Congestive Heart Failure father Mother Living, Diabetes mellitus, Type I, Dementia. age 58 Drug / Alcohol Addiction brother and grandfather fathers side Osteoarthritis mother and sister Father Deceased. 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 Marital status married  Medication History B Complex Vitamins (  Injection) Active. Gabapentin (Oral) Specific dose unknown - Active. Ibuprofen (200MG  Capsule, Oral as needed) Active. Melatonin (10MG  Capsule, Oral) Active. MetFORMIN HCl (500MG  Tablet, Oral daily) Active. Omeprazole (Oral) Specific dose unknown - Active.  Past Surgical History  Dilation and Curettage of Uterus - Multiple Breast Biopsy left DeQuervain's Procedure Date: 1996. Gallbladder Surgery Date: 1998. laporoscopic Arthroscopy of Shoulder Date: 2009. left Carpal Tunnel Repair Date: 1993. left Sinus Surgery Date: 51. Cyst Removal Hysterectomy Date: 1984. partial (cancerous) Arthroscopy of Knee Date: 2003. right Total Knee Replacement Date: 2010. right Revision Right Total Knee Arthroplasty Date: 04/2012.  Review of Systems General Present- Night Sweats. Not Present- Chills, Fatigue, Fever, Memory Loss, Weight Gain and Weight Loss. Skin Not Present- Eczema, Hives, Itching, Lesions and Rash. HEENT Not Present- Dentures, Double Vision, Headache, Hearing Loss, Tinnitus and Visual Loss. Respiratory Present- Allergies and Shortness of breath with exertion. Not Present- Chronic Cough, Coughing up blood and Shortness of breath at rest. Cardiovascular Not Present- Chest Pain, Difficulty Breathing Lying Down, Murmur, Palpitations, Racing/skipping heartbeats and Swelling. Gastrointestinal Not Present- Abdominal Pain, Bloody Stool, Constipation, Diarrhea, Difficulty Swallowing, Heartburn, Jaundice, Loss of appetitie, Nausea and Vomiting. Female Genitourinary Not Present- Blood in Urine, Discharge, Flank Pain, Incontinence, Painful Urination, Urgency, Urinary frequency, Urinary  Retention, Urinating at Night and Weak urinary stream. Musculoskeletal Present- Joint Pain, Muscle Pain and Muscle Weakness. Not Present- Back Pain, Joint Swelling, Morning Stiffness and Spasms. Neurological Not Present- Blackout spells, Difficulty with balance, Dizziness, Paralysis, Tremor and Weakness. Psychiatric Not Present- Insomnia.  Vitals  Weight: 213 lb Height: 69in Weight was reported by patient. Height was reported by patient. Body Surface Area: 2.12 m Body Mass Index: 31.45 kg/m  BP: 142/76 (Sitting, Right Arm, Standard)  Physical Exam General Mental Status -Alert, cooperative and good historian. General Appearance-pleasant, Not in acute distress. Orientation-Oriented X3. Build & Nutrition-Well nourished and Well developed.  Head and Neck Head-normocephalic, atraumatic . Neck Global Assessment - supple, no bruit auscultated on the right, no bruit auscultated on the left.  Eye Vision-Wears corrective lenses. Pupil - Bilateral-Regular and Round. Motion - Bilateral-EOMI.  Chest and Lung Exam Auscultation Breath sounds - clear at anterior chest wall and clear at posterior chest wall. Adventitious sounds - No Adventitious sounds.  Cardiovascular Auscultation Rhythm - Regular rate and rhythm. Heart Sounds - S1 WNL and S2 WNL. Murmurs & Other Heart Sounds - Auscultation of the heart reveals - No Murmurs.  Abdomen Palpation/Percussion Tenderness - Abdomen is non-tender to palpation. Rigidity (guarding) - Abdomen is soft. Auscultation Auscultation of the abdomen reveals - Bowel sounds normal.  Female Genitourinary Note: Not done, not pertinent to present illness  Musculoskeletal Note: She is alert and oriented, in no apparent distress. Her hips show normal range of motion with no discomfort. Right knee with no effusion, range 0 to 125, no tenderness or instability. Left knee with no effusion, range about 5 to 125, slight crepitus on range  of motion, tender medially greater than laterally with no instability noted. Pulses, sensation, and motor are intact.  RADIOGRAPHS: AP of both knees and lateral of the left show essentially bone-on-bone arthritis in the medial compartment of the left knee with minimal to no involvement lateral and patellofemoral.  Assessment & Plan Primary osteoarthritis of left knee (M17.12) Note:Surgical Plans: Left Unicompartmental Knee Replacement  Disposition: Home  PCP: Dr. Laurance Flatten, [facility]  IV TXA  Anesthesia Issues: Postoperative Nausea and Low Blood Pressures in the past.  Signed electronically by Joelene Millin,  III PA-C

## 2014-12-26 NOTE — H&P (Signed)
[name] DOB: 11-02-1955 Married / Language: English / Race: White Female Date of Admission:  01/22/2015 CC:  Left Knee Pain History of Present Illness The patient is a 59 year old female who comes in for a preoperative History and Physical. The patient is scheduled for a left uni knee replacement to be performed by Dr. Dione Plover. Aluisio, MD at [facility] on 01-22-2015. The patient is a 59 year old female who presents today for follow up of their knee. The patient is being followed for their left knee pain and osteoarthritis. They are now 6 week(s) out from Euflexxa series. Symptoms reported today include: pain, aching and difficulty ambulating. The patient feels that they are doing poorly and report their pain level to be mild to moderate. Current treatment includes: relative rest. The patient has reported improvement of their symptoms with: viscosupplementation (helped some, but not a significant improvement). She has considerable pain along the medial aspect of that left knee. The Euflexxa did not provide a tremendous amount of benefit. It is getting harder for her to get around and do things she desires. She has significant medial compartment arthritis with really worsening symptoms. At this point now the most predictable means of improving her pain and function would be arthroplasty. I feel she is an excellent candidate for unicompartmental replacement as everything is isolated to the medial side, she is young, she wants to be more active, and has intact ligaments. I recommend going ahead and doing a unicompartmental replacement. We discussed this in detail, including differences between a unicompartmental and a total knee. She would like to go ahead and proceed with the unicompartmental replacement. They have been treated conservatively in the past for the above stated problem and despite conservative measures, they continue to have progressive pain and severe functional limitations  and dysfunction. They have failed non-operative management including home exercise, medications, and injections. It is felt that they would benefit from undergoing total joint replacement. Risks and benefits of the procedure have been discussed with the patient and they elect to proceed with surgery. There are no active contraindications to surgery such as ongoing infection or rapidly progressive neurological disease.  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) Primary osteoarthritis of left knee (M17.12) Peripheral Neuropathy Skin Cancer Sleep Apnea Achilles tendinitis (M76.60) Tight heel cords, acquired (M67.00) Chronic Obstructive Lung Disease Migraine Headache Osteoarthritis Asthma Bronchitis Menopause Multiple Sclerosis COPD Migraine Headache Anxiety Disorder Depression Hypercholesterolemia Hiatal Hernia Gastroesophageal Reflux Disease Hemorrhoids Non-Insulin Dependent Diabetes Mellitus Measles Mumps  Allergies Penicillin V *PENICILLINS* Mouth Ulcers Statins Flu-like symptoms and aches  Family History Chronic Obstructive Lung Disease father Congestive Heart Failure father Mother Living, Diabetes mellitus, Type I, Dementia. age 85 Drug / Alcohol Addiction brother and grandfather fathers side Osteoarthritis mother and sister Father Deceased. 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 Marital status married  Medication History B  Complex Vitamins (Injection) Active. Gabapentin (Oral) Specific dose unknown - Active. Ibuprofen (200MG  Capsule, Oral as needed) Active. Melatonin (10MG  Capsule, Oral) Active. MetFORMIN HCl (500MG  Tablet, Oral daily) Active. Omeprazole (Oral) Specific dose unknown - Active.  Past Surgical History  Dilation and Curettage of Uterus - Multiple Breast Biopsy left DeQuervain's Procedure Date: 1996. Gallbladder Surgery Date: 1998. laporoscopic Arthroscopy of Shoulder Date: 2009. left Carpal Tunnel Repair Date: 1993. left Sinus Surgery Date: 49. Cyst Removal Hysterectomy Date: 1984. partial (cancerous) Arthroscopy of Knee Date: 2003. right Total Knee Replacement Date: 2010. right Revision Right Total Knee Arthroplasty Date: 04/2012.  Review of Systems General Present- Night Sweats. Not Present- Chills, Fatigue, Fever, Memory Loss, Weight Gain and Weight Loss. Skin Not Present- Eczema, Hives, Itching, Lesions and Rash. HEENT Not Present- Dentures, Double Vision, Headache, Hearing Loss, Tinnitus and Visual Loss. Respiratory Present- Allergies and Shortness of breath with exertion. Not Present- Chronic Cough, Coughing up blood and Shortness of breath at rest. Cardiovascular Not Present- Chest Pain, Difficulty Breathing Lying Down, Murmur, Palpitations, Racing/skipping heartbeats and Swelling. Gastrointestinal Not Present- Abdominal Pain, Bloody Stool, Constipation, Diarrhea, Difficulty Swallowing, Heartburn, Jaundice, Loss of appetitie, Nausea and Vomiting. Female Genitourinary Not Present- Blood in Urine, Discharge, Flank Pain, Incontinence, Painful Urination, Urgency, Urinary frequency, Urinary Retention, Urinating at  Night and Weak urinary stream. Musculoskeletal Present- Joint Pain, Muscle Pain and Muscle Weakness. Not Present- Back Pain, Joint Swelling, Morning Stiffness and Spasms. Neurological Not Present- Blackout spells, Difficulty with balance, Dizziness, Paralysis, Tremor and Weakness. Psychiatric Not Present- Insomnia.  Vitals  Weight: 213 lb Height: 69in Weight was reported by patient. Height was reported by patient. Body Surface Area: 2.12 m Body Mass Index: 31.45 kg/m  BP: 142/76 (Sitting, Right Arm, Standard)  Physical Exam General Mental Status -Alert, cooperative and good historian. General Appearance-pleasant, Not in acute distress. Orientation-Oriented X3. Build & Nutrition-Well nourished and Well developed.  Head and Neck Head-normocephalic, atraumatic . Neck Global Assessment - supple, no bruit auscultated on the right, no bruit auscultated on the left.  Eye Vision-Wears corrective lenses. Pupil - Bilateral-Regular and Round. Motion - Bilateral-EOMI.  Chest and Lung Exam Auscultation Breath sounds - clear at anterior chest wall and clear at posterior chest wall. Adventitious sounds - No Adventitious sounds.  Cardiovascular Auscultation Rhythm - Regular rate and rhythm. Heart Sounds - S1 WNL and S2 WNL. Murmurs & Other Heart Sounds - Auscultation of the heart reveals - No Murmurs.  Abdomen Palpation/Percussion Tenderness - Abdomen is non-tender to palpation. Rigidity (guarding) - Abdomen is soft. Auscultation Auscultation of the abdomen reveals - Bowel sounds normal.  Female Genitourinary Note: Not done, not pertinent to present illness  Musculoskeletal Note: She is alert and oriented, in no apparent distress. Her hips show normal range of motion with no discomfort. Right knee with no effusion, range 0 to 125, no tenderness or instability. Left knee with no effusion, range about 5 to 125, slight crepitus on range of motion, tender  medially greater than laterally with no instability noted. Pulses, sensation, and motor are intact.  RADIOGRAPHS: AP of both knees and lateral of the left show essentially bone-on-bone arthritis in the medial compartment of the left knee with minimal to no involvement lateral and patellofemoral.  Assessment & Plan Primary osteoarthritis of left knee (M17.12) Note:Surgical Plans: Left Unicompartmental Knee Replacement  Disposition: Home  PCP: Dr. Laurance Flatten, [facility]  IV TXA  Anesthesia Issues: Postoperative Nausea and Low Blood Pressures in the past.  Signed electronically by Jenisa Monty  Monika Salk, III PA-C

## 2014-12-27 NOTE — Progress Notes (Signed)
Please put orders in Epic surgery 01-22-15 pre op 01-12-15 Thanks

## 2015-01-04 ENCOUNTER — Ambulatory Visit: Payer: Self-pay | Admitting: Orthopedic Surgery

## 2015-01-04 NOTE — Progress Notes (Signed)
Preoperative surgical orders have been place into the Epic hospital system for [name] on 01/04/2015, 7:58 AM  by Mickel Crow for surgery on 01-22-2015.  Preop Uni Knee orders including Experal, IV Tylenol, and IV Decadron as long as there are no contraindications to the above medications. Arlee Muslim, PA-C

## 2015-01-09 ENCOUNTER — Ambulatory Visit: Payer: Medicare Other

## 2015-01-10 ENCOUNTER — Ambulatory Visit (INDEPENDENT_AMBULATORY_CARE_PROVIDER_SITE_OTHER): Payer: Medicare Other | Admitting: Nurse Practitioner

## 2015-01-10 ENCOUNTER — Encounter: Payer: Self-pay | Admitting: Nurse Practitioner

## 2015-01-10 VITALS — BP 130/72 | HR 77 | Temp 99.5°F | Ht 70.0 in | Wt 224.0 lb

## 2015-01-10 DIAGNOSIS — Z720 Tobacco use: Secondary | ICD-10-CM

## 2015-01-10 DIAGNOSIS — E782 Mixed hyperlipidemia: Secondary | ICD-10-CM | POA: Diagnosis not present

## 2015-01-10 DIAGNOSIS — G35 Multiple sclerosis: Secondary | ICD-10-CM | POA: Diagnosis not present

## 2015-01-10 DIAGNOSIS — E538 Deficiency of other specified B group vitamins: Secondary | ICD-10-CM | POA: Diagnosis not present

## 2015-01-10 DIAGNOSIS — K219 Gastro-esophageal reflux disease without esophagitis: Secondary | ICD-10-CM | POA: Diagnosis not present

## 2015-01-10 DIAGNOSIS — E1142 Type 2 diabetes mellitus with diabetic polyneuropathy: Secondary | ICD-10-CM

## 2015-01-10 DIAGNOSIS — J449 Chronic obstructive pulmonary disease, unspecified: Secondary | ICD-10-CM

## 2015-01-10 DIAGNOSIS — D519 Vitamin B12 deficiency anemia, unspecified: Secondary | ICD-10-CM

## 2015-01-10 DIAGNOSIS — Z6831 Body mass index (BMI) 31.0-31.9, adult: Secondary | ICD-10-CM | POA: Diagnosis not present

## 2015-01-10 LAB — POCT GLYCOSYLATED HEMOGLOBIN (HGB A1C): Hemoglobin A1C: 6

## 2015-01-10 NOTE — Patient Instructions (Signed)
Exercise to Stay Healthy Exercise helps you become and stay healthy. EXERCISE IDEAS AND TIPS Choose exercises that:  You enjoy.  Fit into your day. You do not need to exercise really hard to be healthy. You can do exercises at a slow or medium level and stay healthy. You can:  Stretch before and after working out.  Try yoga, Pilates, or tai chi.  Lift weights.  Walk fast, swim, jog, run, climb stairs, bicycle, dance, or rollerskate.  Take aerobic classes. Exercises that burn about 150 calories:  Running 1  miles in 15 minutes.  Playing volleyball for 45 to 60 minutes.  Washing and waxing a car for 45 to 60 minutes.  Playing touch football for 45 minutes.  Walking 1  miles in 35 minutes.  Pushing a stroller 1  miles in 30 minutes.  Playing basketball for 30 minutes.  Raking leaves for 30 minutes.  Bicycling 5 miles in 30 minutes.  Walking 2 miles in 30 minutes.  Dancing for 30 minutes.  Shoveling snow for 15 minutes.  Swimming laps for 20 minutes.  Walking up stairs for 15 minutes.  Bicycling 4 miles in 15 minutes.  Gardening for 30 to 45 minutes.  Jumping rope for 15 minutes.  Washing windows or floors for 45 to 60 minutes. Document Released: 08/30/2010 Document Revised: 10/20/2011 Document Reviewed: 08/30/2010 Exit[employer] Patient Information 2015 [employer]. This information is not intended to replace advice given to you by your health care provider. Make sure you discuss any questions you have with your health care provider.  

## 2015-01-10 NOTE — Progress Notes (Signed)
Subjective:    Patient ID: [name], female    DOB: [birth year], 57 y.o.   MRN: [id number]  Patient here today for follow up of chronic medical problems. No acute problem.  Hyperlipidemia This is a chronic problem. The current episode started more than 1 year ago. Recent lipid tests were reviewed and are high. Pertinent negatives include no chest pain, myalgias or shortness of breath. Current antihyperlipidemic treatment includes statins. The current treatment provides moderate improvement of lipids. Compliance problems include adherence to diet and adherence to exercise.  Risk factors for coronary artery disease include diabetes mellitus.  Diabetes She presents for her follow-up diabetic visit. She has type 2 diabetes mellitus. There are no hypoglycemic associated symptoms. Pertinent negatives for diabetes include no chest pain, no polydipsia, no polyphagia, no polyuria, no visual change and no weakness. There are no hypoglycemic complications. There are no diabetic complications. Risk factors for coronary artery disease include diabetes mellitus, dyslipidemia, hypertension and post-menopausal. She is compliant with treatment all of the time. Her weight is stable. She is following a generally healthy diet. When asked about meal planning, she reported none. She has not had a previous visit with a dietitian. She rarely participates in exercise. There is no change in her home blood glucose trend. Her breakfast blood glucose is taken between 9-10 am. Her breakfast blood glucose range is generally 130-140 mg/dl. An ACE inhibitor/angiotensin II receptor blocker is not being taken. She does not see a podiatrist.Eye exam is not current.  MS Sees Dr Bjorn Loser in [place] every 6 months- currently under control GERD omerprazole daily- keeps symptom sunder control bil foot neuropathy Gabapentin helps with bruning senastion- no c/ side effects COPD  No maintenance inhaler- only ues albuterol as needed- hasn't  needed in about 2 months B 12 def Found at last visit- has been getting injections weekly for the  Last month- once a month B12 injection.     Review of Systems  Respiratory: Negative for shortness of breath.   Cardiovascular: Negative for chest pain.  Endocrine: Negative for polydipsia, polyphagia and polyuria.  Musculoskeletal: Negative for myalgias.  Neurological: Negative for weakness.  All other systems reviewed and are negative.      Objective:   Physical Exam  Constitutional: She is oriented to person, place, and time. She appears well-developed and well-nourished.  HENT:  Head: Normocephalic and atraumatic.  Eyes: Pupils are equal, round, and reactive to light.  Neck: Normal range of motion.  Cardiovascular: Normal rate and regular rhythm.  Exam reveals no gallop and no friction rub.   No murmur heard. Pulmonary/Chest: Effort normal and breath sounds normal. No respiratory distress. She has no wheezes. She has no rales. She exhibits no tenderness.  Abdominal: Soft.  Neurological: She is alert and oriented to person, place, and time. She has normal reflexes.  Skin: Skin is warm.  Psychiatric: She has a normal mood and affect. Her behavior is normal. Judgment and thought content normal.    BP 130/72 mmHg  Pulse 77  Temp(Src) 99.5 F (37.5 C) (Oral)  Ht 5' 10" (1.778 m)  Wt 224 lb (101.606 kg)  BMI 32.14 kg/m2   Results for orders placed or performed in visit on 01/10/15  POCT glycosylated hemoglobin (Hb A1C)  Result Value Ref Range   Hemoglobin A1C 6.0           Assessment & Plan:   1. COPD with chronic bronchitis  2. Gastroesophageal reflux disease without esophagitis Avoid spicy foods Do  not eat 2 hours prior to bedtime   3. Type 2 diabetes mellitus with diabetic polyneuropathy Watch carbs in diet - POCT glycosylated hemoglobin (Hb A1C)  4. Multiple sclerosis  5. Diabetic polyneuropathy associated with type 2 diabetes mellitus  6. Tobacco  abuse  7. Mixed hyperlipidemia - CMP14+EGFR - NMR, lipoprofile  8. B12 deficiency anemia b12 shot today  9. BMI 31.0-31.9,adult Discussed diet and exercise for person with BMI >25 Will recheck weight in 3-6 months     Labs pending Health maintenance reviewed Diet and exercise encouraged Continue all meds Follow up  In 3 month   [place], FNP

## 2015-01-11 LAB — NMR, LIPOPROFILE
Cholesterol: 244 mg/dL — ABNORMAL HIGH (ref 100–199)
HDL CHOLESTEROL BY NMR: 43 mg/dL (ref >39)
HDL Particle Number: 29 umol/L — ABNORMAL LOW (ref >=30.5)
LDL Particle Number: 2433 nmol/L — ABNORMAL HIGH (ref <1000)
LDL Size: 20.4 nm (ref >20.5)
LDL-C: 177 mg/dL — ABNORMAL HIGH (ref 0–99)
LP-IR Score: 76 — ABNORMAL HIGH (ref <=45)
Small LDL Particle Number: 1435 nmol/L — ABNORMAL HIGH (ref <=527)
Triglycerides by NMR: 121 mg/dL (ref 0–149)

## 2015-01-11 LAB — CMP14+EGFR
ALK PHOS: 64 IU/L (ref 39–117)
ALT: 16 IU/L (ref 0–32)
AST: 17 IU/L (ref 0–40)
Albumin/Globulin Ratio: 1.8 (ref 1.1–2.5)
Albumin: 4 g/dL (ref 3.5–5.5)
BUN/Creatinine Ratio: 13 (ref 9–23)
BUN: 10 mg/dL (ref 6–24)
Bilirubin Total: 0.6 mg/dL (ref 0.0–1.2)
CO2: 27 mmol/L (ref 18–29)
CREATININE: 0.75 mg/dL (ref 0.57–1.00)
Calcium: 9.1 mg/dL (ref 8.7–10.2)
Chloride: 100 mmol/L (ref 97–108)
GFR calc Af Amer: 103 mL/min/{1.73_m2} (ref >59)
GFR, EST NON AFRICAN AMERICAN: 89 mL/min/{1.73_m2} (ref >59)
GLOBULIN, TOTAL: 2.2 g/dL (ref 1.5–4.5)
Glucose: 113 mg/dL — ABNORMAL HIGH (ref 65–99)
Potassium: 4.3 mmol/L (ref 3.5–5.2)
Sodium: 142 mmol/L (ref 134–144)
TOTAL PROTEIN: 6.2 g/dL (ref 6.0–8.5)

## 2015-01-11 NOTE — Patient Instructions (Addendum)
YOUR PROCEDURE IS SCHEDULED ON : 01/22/15  REPORT TO [facility] MAIN ENTRANCE FOLLOW SIGNS TO SHORT STAY CENTER AT :  6:30 AM  CALL THIS NUMBER IF YOU HAVE PROBLEMS THE MORNING OF SURGERY [phone]  REMEMBER:  DO NOT EAT FOOD OR DRINK LIQUIDS AFTER MIDNIGHT  TAKE THESE MEDICINES THE MORNING OF SURGERY: GABAPENTIN / OMEPRAZOLE   YOU MAY NOT HAVE ANY METAL ON YOUR BODY INCLUDING HAIR PINS AND PIERCING'S. DO NOT WEAR JEWELRY, MAKEUP, LOTIONS, POWDERS OR PERFUMES. DO NOT WEAR NAIL POLISH. DO NOT SHAVE 48 HRS PRIOR TO SURGERY. MEN MAY SHAVE FACE AND NECK.  DO NOT Adia Crammer. [facility] IS NOT RESPONSIBLE FOR VALUABLES.  CONTACTS, DENTURES OR PARTIALS MAY NOT BE WORN TO SURGERY. LEAVE SUITCASE IN CAR. CAN BE BROUGHT TO ROOM AFTER SURGERY.  PATIENTS DISCHARGED THE DAY OF SURGERY WILL NOT BE ALLOWED TO DRIVE HOME.  PLEASE READ OVER THE FOLLOWING INSTRUCTION SHEETS _________________________________________________________________________________                                          [facility] - PREPARING FOR SURGERY  Before surgery, you can play an important role.  Because skin is not sterile, your skin needs to be as free of germs as possible.  You can reduce the number of germs on your skin by washing with CHG (chlorahexidine gluconate) soap before surgery.  CHG is an antiseptic cleaner which kills germs and bonds with the skin to continue killing germs even after washing. Please DO NOT use if you have an allergy to CHG or antibacterial soaps.  If your skin becomes reddened/irritated stop using the CHG and inform your nurse when you arrive at Short Stay. Do not shave (including legs and underarms) for at least 48 hours prior to the first CHG shower.  You may shave your face. Please follow these instructions carefully:   1.  Shower with CHG Soap the night before surgery and the  morning of Surgery.   2.  If you choose to wash your hair, wash  your hair first as usual with your  normal  Shampoo.   3.  After you shampoo, rinse your hair and body thoroughly to remove the  shampoo.                                         4.  Use CHG as you would any other liquid soap.  You can apply chg directly  to the skin and wash . Gently wash with scrungie or clean wascloth    5.  Apply the CHG Soap to your body ONLY FROM THE NECK DOWN.   Do not use on open                           Wound or open sores. Avoid contact with eyes, ears mouth and genitals (private parts).                        Genitals (private parts) with your normal soap.              6.  Wash thoroughly, paying special attention to the area where your surgery  will be performed.  7.  Thoroughly rinse your body with warm water from the neck down.   8.  DO NOT shower/wash with your normal soap after using and rinsing off  the CHG Soap .                9.  Pat yourself dry with a clean towel.             10.  Wear clean night clothes to bed after shower             11.  Place clean sheets on your bed the night of your first shower and do not  sleep with pets.  Day of Surgery : Do not apply any lotions/deodorants the morning of surgery.  Please wear clean clothes to the hospital/surgery center.  FAILURE TO FOLLOW THESE INSTRUCTIONS MAY RESULT IN THE CANCELLATION OF YOUR SURGERY    PATIENT SIGNATURE_________________________________  ______________________________________________________________________     Adam Phenix  An incentive spirometer is a tool that can help keep your lungs clear and active. This tool measures how well you are filling your lungs with each breath. Taking long deep breaths may help reverse or decrease the chance of developing breathing (pulmonary) problems (especially infection) following:  A long period of time when you are unable to move or be active. BEFORE THE PROCEDURE   If the spirometer includes an indicator to show your best  effort, your nurse or respiratory therapist will set it to a desired goal.  If possible, sit up straight or lean slightly forward. Try not to slouch.  Hold the incentive spirometer in an upright position. INSTRUCTIONS FOR USE   Sit on the edge of your bed if possible, or sit up as far as you can in bed or on a chair.  Hold the incentive spirometer in an upright position.  Breathe out normally.  Place the mouthpiece in your mouth and seal your lips tightly around it.  Breathe in slowly and as deeply as possible, raising the piston or the ball toward the top of the column.  Hold your breath for 3-5 seconds or for as long as possible. Allow the piston or ball to fall to the bottom of the column.  Remove the mouthpiece from your mouth and breathe out normally.  Rest for a few seconds and repeat Steps 1 through 7 at least 10 times every 1-2 hours when you are awake. Take your time and take a few normal breaths between deep breaths.  The spirometer may include an indicator to show your best effort. Use the indicator as a goal to work toward during each repetition.  After each set of 10 deep breaths, practice coughing to be sure your lungs are clear. If you have an incision (the cut made at the time of surgery), support your incision when coughing by placing a pillow or rolled up towels firmly against it. Once you are able to get out of bed, walk around indoors and cough well. You may stop using the incentive spirometer when instructed by your caregiver.  RISKS AND COMPLICATIONS  Take your time so you do not get dizzy or light-headed.  If you are in pain, you may need to take or ask for pain medication before doing incentive spirometry. It is harder to take a deep breath if you are having pain. AFTER USE  Rest and breathe slowly and easily.  It can be helpful to keep track of a log of your progress. Your caregiver can provide you  with a simple table to help with this. If you are using  the spirometer at home, follow these instructions: [place] IF:   You are having difficultly using the spirometer.  You have trouble using the spirometer as often as instructed.  Your pain medication is not giving enough relief while using the spirometer.  You develop fever of 100.5 F (38.1 C) or higher. SEEK IMMEDIATE MEDICAL CARE IF:   You cough up bloody sputum that had not been present before.  You develop fever of 102 F (38.9 C) or greater.  You develop worsening pain at or near the incision site. MAKE SURE YOU:   Understand these instructions.  Will watch your condition.  Will get help right away if you are not doing well or get worse. Document Released: 12/08/2006 Document Revised: 10/20/2011 Document Reviewed: 02/08/2007 Exit[employer] Patient Information 2014 [employer], [place].   ________________________________________________________________________  WHAT IS A BLOOD TRANSFUSION? Blood Transfusion Information  A transfusion is the replacement of blood or some of its parts. Blood is made up of multiple cells which provide different functions.  Red blood cells carry oxygen and are used for blood loss replacement.  White blood cells fight against infection.  Platelets control bleeding.  Plasma helps clot blood.  Other blood products are available for specialized needs, such as hemophilia or other clotting disorders. BEFORE THE TRANSFUSION  Who gives blood for transfusions?   Healthy volunteers who are fully evaluated to make sure their blood is safe. This is blood bank blood. Transfusion therapy is the safest it has ever been in the practice of medicine. Before blood is taken from a donor, a complete history is taken to make sure that person has no history of diseases nor engages in risky social behavior (examples are intravenous drug use or sexual activity with multiple partners). The donor's travel history is screened to minimize risk of transmitting  infections, such as malaria. The donated blood is tested for signs of infectious diseases, such as HIV and hepatitis. The blood is then tested to be sure it is compatible with you in order to minimize the chance of a transfusion reaction. If you or a relative donates blood, this is often done in anticipation of surgery and is not appropriate for emergency situations. It takes many days to process the donated blood. RISKS AND COMPLICATIONS Although transfusion therapy is very safe and saves many lives, the main dangers of transfusion include:   Getting an infectious disease.  Developing a transfusion reaction. This is an allergic reaction to something in the blood you were given. Every precaution is taken to prevent this. The decision to have a blood transfusion has been considered carefully by your caregiver before blood is given. Blood is not given unless the benefits outweigh the risks. AFTER THE TRANSFUSION  Right after receiving a blood transfusion, you will usually feel much better and more energetic. This is especially true if your red blood cells have gotten low (anemic). The transfusion raises the level of the red blood cells which carry oxygen, and this usually causes an energy increase.  The nurse administering the transfusion will monitor you carefully for complications. HOME CARE INSTRUCTIONS  No special instructions are needed after a transfusion. You may find your energy is better. Speak with your caregiver about any limitations on activity for underlying diseases you may have. SEEK MEDICAL CARE IF:   Your condition is not improving after your transfusion.  You develop redness or irritation at the intravenous (IV) site.  SEEK IMMEDIATE MEDICAL CARE IF:  Any of the following symptoms occur over the next 12 hours:  Shaking chills.  You have a temperature by mouth above 102 F (38.9 C), not controlled by medicine.  Chest, back, or muscle pain.  People around you feel you are  not acting correctly or are confused.  Shortness of breath or difficulty breathing.  Dizziness and fainting.  You get a rash or develop hives.  You have a decrease in urine output.  Your urine turns a dark color or changes to pink, red, or brown. Any of the following symptoms occur over the next 10 days:  You have a temperature by mouth above 102 F (38.9 C), not controlled by medicine.  Shortness of breath.  Weakness after normal activity.  The white part of the eye turns yellow (jaundice).  You have a decrease in the amount of urine or are urinating less often.  Your urine turns a dark color or changes to pink, red, or brown. Document Released: 07/25/2000 Document Revised: 10/20/2011 Document Reviewed: 03/13/2008 [facility] Patient Information 2014 [place], [place].  _______________________________________________________________________

## 2015-01-12 ENCOUNTER — Encounter (HOSPITAL_COMMUNITY): Payer: Self-pay

## 2015-01-12 ENCOUNTER — Encounter (HOSPITAL_COMMUNITY)
Admission: RE | Admit: 2015-01-12 | Discharge: 2015-01-12 | Disposition: A | Discharge status: Home / Self Care | Payer: Medicare Other | Source: Ambulatory Visit | Attending: Orthopedic Surgery | Admitting: Orthopedic Surgery

## 2015-01-12 DIAGNOSIS — Z01812 Encounter for preprocedural laboratory examination: Secondary | ICD-10-CM | POA: Diagnosis present

## 2015-01-12 DIAGNOSIS — M1712 Unilateral primary osteoarthritis, left knee: Secondary | ICD-10-CM | POA: Insufficient documentation

## 2015-01-12 HISTORY — DX: Anxiety disorder, unspecified: F41.9

## 2015-01-12 HISTORY — DX: Personal history of other malignant neoplasm of skin: Z85.828

## 2015-01-12 HISTORY — DX: Myoneural disorder, unspecified: G70.9

## 2015-01-12 LAB — URINE MICROSCOPIC-ADD ON

## 2015-01-12 LAB — URINALYSIS, ROUTINE W REFLEX MICROSCOPIC
Bilirubin Urine: NEGATIVE
Glucose, UA: NEGATIVE mg/dL
Ketones, ur: NEGATIVE mg/dL
Nitrite: NEGATIVE
PH: 5 (ref 5.0–8.0)
PROTEIN: NEGATIVE mg/dL
Specific Gravity, Urine: 1.019 (ref 1.005–1.030)
Urobilinogen, UA: 0.2 mg/dL (ref 0.0–1.0)

## 2015-01-12 LAB — CBC
HCT: 40.7 % (ref 36.0–46.0)
HEMOGLOBIN: 13 g/dL (ref 12.0–15.0)
MCH: 28.7 pg (ref 26.0–34.0)
MCHC: 31.9 g/dL (ref 30.0–36.0)
MCV: 89.8 fL (ref 78.0–100.0)
Platelets: 226 10*3/uL (ref 150–400)
RBC: 4.53 MIL/uL (ref 3.87–5.11)
RDW: 12.9 % (ref 11.5–15.5)
WBC: 9.2 10*3/uL (ref 4.0–10.5)

## 2015-01-12 LAB — PROTIME-INR
INR: 1.11 (ref 0.00–1.49)
Prothrombin Time: 14.5 seconds (ref 11.6–15.2)

## 2015-01-12 LAB — SURGICAL PCR SCREEN
MRSA, PCR: NEGATIVE
Staphylococcus aureus: NEGATIVE

## 2015-01-12 LAB — APTT: APTT: 30 s (ref 24–37)

## 2015-01-15 NOTE — Progress Notes (Signed)
Abnormal UA faxed to Dr.Aluisio 

## 2015-01-21 MED ORDER — VANCOMYCIN HCL 10 G IV SOLR
1500.0000 mg | INTRAVENOUS | Status: AC
  Administered 2015-01-22: 1500 mg via INTRAVENOUS
  Filled 2015-01-21: qty 1500

## 2015-01-22 ENCOUNTER — Inpatient Hospital Stay (HOSPITAL_COMMUNITY)
Admission: RE | Admit: 2015-01-22 | Discharge: 2015-01-23 | DRG: 470 | Disposition: A | Discharge status: Home Health | Payer: Medicare Other | Source: Ambulatory Visit | Attending: Orthopedic Surgery | Admitting: Orthopedic Surgery

## 2015-01-22 ENCOUNTER — Encounter (HOSPITAL_COMMUNITY): Payer: Self-pay | Admitting: *Deleted

## 2015-01-22 ENCOUNTER — Encounter (HOSPITAL_COMMUNITY)
Admission: RE | Disposition: A | Discharge status: Home Health | Payer: Self-pay | Source: Ambulatory Visit | Attending: Orthopedic Surgery

## 2015-01-22 ENCOUNTER — Inpatient Hospital Stay (HOSPITAL_COMMUNITY): Payer: Medicare Other | Admitting: Anesthesiology

## 2015-01-22 DIAGNOSIS — M1712 Unilateral primary osteoarthritis, left knee: Secondary | ICD-10-CM | POA: Diagnosis present

## 2015-01-22 DIAGNOSIS — Z01812 Encounter for preprocedural laboratory examination: Secondary | ICD-10-CM | POA: Diagnosis not present

## 2015-01-22 DIAGNOSIS — Z96651 Presence of right artificial knee joint: Secondary | ICD-10-CM | POA: Diagnosis present

## 2015-01-22 DIAGNOSIS — F17210 Nicotine dependence, cigarettes, uncomplicated: Secondary | ICD-10-CM | POA: Diagnosis present

## 2015-01-22 DIAGNOSIS — E119 Type 2 diabetes mellitus without complications: Secondary | ICD-10-CM | POA: Diagnosis present

## 2015-01-22 DIAGNOSIS — J449 Chronic obstructive pulmonary disease, unspecified: Secondary | ICD-10-CM | POA: Diagnosis present

## 2015-01-22 DIAGNOSIS — Z79899 Other long term (current) drug therapy: Secondary | ICD-10-CM | POA: Diagnosis not present

## 2015-01-22 DIAGNOSIS — G4730 Sleep apnea, unspecified: Secondary | ICD-10-CM | POA: Diagnosis present

## 2015-01-22 DIAGNOSIS — Z8541 Personal history of malignant neoplasm of cervix uteri: Secondary | ICD-10-CM | POA: Diagnosis not present

## 2015-01-22 DIAGNOSIS — F419 Anxiety disorder, unspecified: Secondary | ICD-10-CM | POA: Diagnosis present

## 2015-01-22 DIAGNOSIS — M25562 Pain in left knee: Secondary | ICD-10-CM | POA: Diagnosis present

## 2015-01-22 DIAGNOSIS — M179 Osteoarthritis of knee, unspecified: Secondary | ICD-10-CM | POA: Diagnosis present

## 2015-01-22 DIAGNOSIS — G35 Multiple sclerosis: Secondary | ICD-10-CM | POA: Diagnosis present

## 2015-01-22 DIAGNOSIS — R001 Bradycardia, unspecified: Secondary | ICD-10-CM | POA: Diagnosis not present

## 2015-01-22 DIAGNOSIS — M1710 Unilateral primary osteoarthritis, unspecified knee: Secondary | ICD-10-CM | POA: Diagnosis present

## 2015-01-22 DIAGNOSIS — Z833 Family history of diabetes mellitus: Secondary | ICD-10-CM | POA: Diagnosis not present

## 2015-01-22 HISTORY — PX: PARTIAL KNEE ARTHROPLASTY: SHX2174

## 2015-01-22 LAB — GLUCOSE, CAPILLARY
GLUCOSE-CAPILLARY: 147 mg/dL — AB (ref 65–99)
Glucose-Capillary: 114 mg/dL — ABNORMAL HIGH (ref 65–99)
Glucose-Capillary: 133 mg/dL — ABNORMAL HIGH (ref 65–99)
Glucose-Capillary: 245 mg/dL — ABNORMAL HIGH (ref 65–99)
Glucose-Capillary: 208 mg/dL — ABNORMAL HIGH (ref 65–99)

## 2015-01-22 LAB — TYPE AND SCREEN
ABO/RH(D): O POS
ANTIBODY SCREEN: NEGATIVE

## 2015-01-22 SURGERY — ARTHROPLASTY, KNEE, UNICOMPARTMENTAL
Anesthesia: General | Site: Knee | Laterality: Left

## 2015-01-22 MED ORDER — METFORMIN HCL 500 MG PO TABS
500.0000 mg | ORAL_TABLET | Freq: Every day | ORAL | Status: DC
  Administered 2015-01-23: 500 mg via ORAL
  Filled 2015-01-22 (×2): qty 1

## 2015-01-22 MED ORDER — PROMETHAZINE HCL 25 MG/ML IJ SOLN: 6.2500 mg | Freq: Four times a day (QID) | INTRAMUSCULAR | Status: DC | PRN

## 2015-01-22 MED ORDER — HYDROMORPHONE HCL 1 MG/ML IJ SOLN
INTRAMUSCULAR | Status: DC | PRN
  Administered 2015-01-22: 1 mg via INTRAVENOUS

## 2015-01-22 MED ORDER — MIDAZOLAM HCL 5 MG/5ML IJ SOLN
INTRAMUSCULAR | Status: DC | PRN
  Administered 2015-01-22: 2 mg via INTRAVENOUS

## 2015-01-22 MED ORDER — FENTANYL CITRATE (PF) 100 MCG/2ML IJ SOLN
INTRAMUSCULAR | Status: DC | PRN
  Administered 2015-01-22: 100 ug via INTRAVENOUS

## 2015-01-22 MED ORDER — SODIUM CHLORIDE 0.9 % IJ SOLN
INTRAMUSCULAR | Status: DC | PRN
  Administered 2015-01-22: 30 mL

## 2015-01-22 MED ORDER — SODIUM CHLORIDE 0.9 % IV SOLN: INTRAVENOUS | Status: DC

## 2015-01-22 MED ORDER — CHLORHEXIDINE GLUCONATE 4 % EX LIQD: 60.0000 mL | Freq: Once | CUTANEOUS | Status: DC

## 2015-01-22 MED ORDER — TRANEXAMIC ACID 1000 MG/10ML IV SOLN
1000.0000 mg | INTRAVENOUS | Status: AC
  Administered 2015-01-22: 1000 mg via INTRAVENOUS
  Filled 2015-01-22: qty 10

## 2015-01-22 MED ORDER — DEXAMETHASONE SODIUM PHOSPHATE 10 MG/ML IJ SOLN
INTRAMUSCULAR | Status: AC
  Filled 2015-01-22: qty 1

## 2015-01-22 MED ORDER — INSULIN ASPART 100 UNIT/ML ~~LOC~~ SOLN
0.0000 [IU] | Freq: Three times a day (TID) | SUBCUTANEOUS | Status: DC
  Administered 2015-01-22: 2 [IU] via SUBCUTANEOUS
  Administered 2015-01-22: 5 [IU] via SUBCUTANEOUS

## 2015-01-22 MED ORDER — DIPHENHYDRAMINE HCL 12.5 MG/5ML PO ELIX: 12.5000 mg | ORAL_SOLUTION | ORAL | Status: DC | PRN

## 2015-01-22 MED ORDER — HYDROMORPHONE HCL 1 MG/ML IJ SOLN
0.2500 mg | INTRAMUSCULAR | Status: DC | PRN
  Administered 2015-01-22: 0.25 mg via INTRAVENOUS

## 2015-01-22 MED ORDER — BUPIVACAINE LIPOSOME 1.3 % IJ SUSP
INTRAMUSCULAR | Status: DC | PRN
  Administered 2015-01-22: 20 mL

## 2015-01-22 MED ORDER — METHOCARBAMOL 500 MG PO TABS: 500.0000 mg | ORAL_TABLET | Freq: Four times a day (QID) | ORAL | Status: DC | PRN

## 2015-01-22 MED ORDER — ALBUTEROL SULFATE (2.5 MG/3ML) 0.083% IN NEBU: 3.0000 mL | INHALATION_SOLUTION | Freq: Four times a day (QID) | RESPIRATORY_TRACT | Status: DC | PRN

## 2015-01-22 MED ORDER — HYDROMORPHONE HCL 1 MG/ML IJ SOLN
INTRAMUSCULAR | Status: AC
  Filled 2015-01-22: qty 1

## 2015-01-22 MED ORDER — SUCCINYLCHOLINE CHLORIDE 20 MG/ML IJ SOLN
INTRAMUSCULAR | Status: DC | PRN
  Administered 2015-01-22: 100 mg via INTRAVENOUS

## 2015-01-22 MED ORDER — BISACODYL 10 MG RE SUPP: 10.0000 mg | Freq: Every day | RECTAL | Status: DC | PRN

## 2015-01-22 MED ORDER — SODIUM CHLORIDE 0.9 % IJ SOLN
INTRAMUSCULAR | Status: AC
  Filled 2015-01-22: qty 50

## 2015-01-22 MED ORDER — VANCOMYCIN HCL IN DEXTROSE 1-5 GM/200ML-% IV SOLN
1000.0000 mg | Freq: Two times a day (BID) | INTRAVENOUS | Status: AC
  Administered 2015-01-22: 1000 mg via INTRAVENOUS
  Filled 2015-01-22: qty 200

## 2015-01-22 MED ORDER — ONDANSETRON HCL 4 MG/2ML IJ SOLN
INTRAMUSCULAR | Status: AC
  Filled 2015-01-22: qty 2

## 2015-01-22 MED ORDER — BUPIVACAINE LIPOSOME 1.3 % IJ SUSP
20.0000 mL | Freq: Once | INTRAMUSCULAR | Status: DC
  Filled 2015-01-22: qty 20

## 2015-01-22 MED ORDER — BUPIVACAINE HCL (PF) 0.25 % IJ SOLN
INTRAMUSCULAR | Status: DC | PRN
  Administered 2015-01-22: 20 mL

## 2015-01-22 MED ORDER — LIDOCAINE HCL (PF) 2 % IJ SOLN
INTRAMUSCULAR | Status: DC | PRN
  Administered 2015-01-22: 40 mg via INTRADERMAL

## 2015-01-22 MED ORDER — ACETAMINOPHEN 10 MG/ML IV SOLN
INTRAVENOUS | Status: AC
  Filled 2015-01-22: qty 100

## 2015-01-22 MED ORDER — DEXAMETHASONE SODIUM PHOSPHATE 10 MG/ML IJ SOLN
10.0000 mg | Freq: Once | INTRAMUSCULAR | Status: AC
  Administered 2015-01-23: 10 mg via INTRAVENOUS
  Filled 2015-01-22: qty 1

## 2015-01-22 MED ORDER — SODIUM CHLORIDE 0.9 % IV SOLN
INTRAVENOUS | Status: DC
  Administered 2015-01-22: 12:00:00 via INTRAVENOUS

## 2015-01-22 MED ORDER — LACTATED RINGERS IV SOLN
INTRAVENOUS | Status: DC
  Administered 2015-01-22: 10:00:00 via INTRAVENOUS
  Administered 2015-01-22: 1000 mL via INTRAVENOUS

## 2015-01-22 MED ORDER — BUPIVACAINE HCL (PF) 0.25 % IJ SOLN
INTRAMUSCULAR | Status: AC
  Filled 2015-01-22: qty 30

## 2015-01-22 MED ORDER — DOCUSATE SODIUM 100 MG PO CAPS
100.0000 mg | ORAL_CAPSULE | Freq: Two times a day (BID) | ORAL | Status: DC
  Administered 2015-01-22 – 2015-01-23 (×2): 100 mg via ORAL

## 2015-01-22 MED ORDER — MENTHOL 3 MG MT LOZG: 1.0000 | LOZENGE | OROMUCOSAL | Status: DC | PRN

## 2015-01-22 MED ORDER — MIDAZOLAM HCL 2 MG/2ML IJ SOLN
INTRAMUSCULAR | Status: AC
  Filled 2015-01-22: qty 2

## 2015-01-22 MED ORDER — PHENOL 1.4 % MT LIQD
1.0000 | OROMUCOSAL | Status: DC | PRN
  Filled 2015-01-22: qty 177

## 2015-01-22 MED ORDER — GABAPENTIN 600 MG PO TABS
600.0000 mg | ORAL_TABLET | Freq: Four times a day (QID) | ORAL | Status: DC
  Filled 2015-01-22 (×3): qty 1

## 2015-01-22 MED ORDER — ONDANSETRON HCL 4 MG PO TABS: 4.0000 mg | ORAL_TABLET | Freq: Four times a day (QID) | ORAL | Status: DC | PRN

## 2015-01-22 MED ORDER — PROPOFOL 10 MG/ML IV BOLUS
INTRAVENOUS | Status: AC
  Filled 2015-01-22: qty 20

## 2015-01-22 MED ORDER — POLYETHYLENE GLYCOL 3350 17 G PO PACK: 17.0000 g | PACK | Freq: Every day | ORAL | Status: DC | PRN

## 2015-01-22 MED ORDER — ACETAMINOPHEN 325 MG PO TABS: 650.0000 mg | ORAL_TABLET | Freq: Four times a day (QID) | ORAL | Status: DC | PRN

## 2015-01-22 MED ORDER — GABAPENTIN 300 MG PO CAPS
600.0000 mg | ORAL_CAPSULE | Freq: Four times a day (QID) | ORAL | Status: DC
  Administered 2015-01-22 – 2015-01-23 (×4): 600 mg via ORAL
  Filled 2015-01-22 (×7): qty 2

## 2015-01-22 MED ORDER — LIDOCAINE HCL (CARDIAC) 20 MG/ML IV SOLN
INTRAVENOUS | Status: AC
  Filled 2015-01-22: qty 5

## 2015-01-22 MED ORDER — DEXTROSE 5 % IV SOLN
500.0000 mg | Freq: Four times a day (QID) | INTRAVENOUS | Status: DC | PRN
  Administered 2015-01-22: 500 mg via INTRAVENOUS
  Filled 2015-01-22 (×2): qty 5

## 2015-01-22 MED ORDER — ACETAMINOPHEN 650 MG RE SUPP: 650.0000 mg | Freq: Four times a day (QID) | RECTAL | Status: DC | PRN

## 2015-01-22 MED ORDER — SCOPOLAMINE 1 MG/3DAYS TD PT72
MEDICATED_PATCH | TRANSDERMAL | Status: AC
  Filled 2015-01-22: qty 1

## 2015-01-22 MED ORDER — HYDROMORPHONE HCL 2 MG/ML IJ SOLN
INTRAMUSCULAR | Status: AC
  Filled 2015-01-22: qty 1

## 2015-01-22 MED ORDER — PANTOPRAZOLE SODIUM 40 MG PO TBEC
40.0000 mg | DELAYED_RELEASE_TABLET | Freq: Every day | ORAL | Status: DC
  Administered 2015-01-23: 40 mg via ORAL
  Filled 2015-01-22 (×2): qty 1

## 2015-01-22 MED ORDER — FENTANYL CITRATE (PF) 100 MCG/2ML IJ SOLN
INTRAMUSCULAR | Status: AC
  Filled 2015-01-22: qty 2

## 2015-01-22 MED ORDER — PROPOFOL 10 MG/ML IV BOLUS
INTRAVENOUS | Status: DC | PRN
  Administered 2015-01-22: 130 mg via INTRAVENOUS

## 2015-01-22 MED ORDER — SCOPOLAMINE 1 MG/3DAYS TD PT72
1.0000 | MEDICATED_PATCH | TRANSDERMAL | Status: DC
  Administered 2015-01-22: 1.5 mg via TRANSDERMAL
  Filled 2015-01-22: qty 1

## 2015-01-22 MED ORDER — TRAMADOL HCL 50 MG PO TABS
50.0000 mg | ORAL_TABLET | Freq: Four times a day (QID) | ORAL | Status: DC | PRN
Start: 2015-01-22 — End: 2015-01-23

## 2015-01-22 MED ORDER — ONDANSETRON HCL 4 MG/2ML IJ SOLN: 4.0000 mg | Freq: Four times a day (QID) | INTRAMUSCULAR | Status: DC | PRN

## 2015-01-22 MED ORDER — METOCLOPRAMIDE HCL 10 MG PO TABS: 5.0000 mg | ORAL_TABLET | Freq: Three times a day (TID) | ORAL | Status: DC | PRN

## 2015-01-22 MED ORDER — FLEET ENEMA 7-19 GM/118ML RE ENEM: 1.0000 | ENEMA | Freq: Once | RECTAL | Status: AC | PRN

## 2015-01-22 MED ORDER — RIVAROXABAN 10 MG PO TABS
10.0000 mg | ORAL_TABLET | Freq: Every day | ORAL | Status: DC
  Administered 2015-01-23: 10 mg via ORAL
  Filled 2015-01-22 (×2): qty 1

## 2015-01-22 MED ORDER — LACTATED RINGERS IV SOLN: INTRAVENOUS | Status: DC

## 2015-01-22 MED ORDER — OXYCODONE HCL 5 MG PO TABS
5.0000 mg | ORAL_TABLET | ORAL | Status: DC | PRN
  Administered 2015-01-23: 10 mg via ORAL
  Filled 2015-01-22: qty 2

## 2015-01-22 MED ORDER — ACETAMINOPHEN 500 MG PO TABS
1000.0000 mg | ORAL_TABLET | Freq: Four times a day (QID) | ORAL | Status: DC
  Administered 2015-01-22 – 2015-01-23 (×2): 1000 mg via ORAL
  Filled 2015-01-22 (×3): qty 2

## 2015-01-22 MED ORDER — ONDANSETRON HCL 4 MG/2ML IJ SOLN
INTRAMUSCULAR | Status: DC | PRN
  Administered 2015-01-22: 4 mg via INTRAVENOUS

## 2015-01-22 MED ORDER — DEXAMETHASONE SODIUM PHOSPHATE 10 MG/ML IJ SOLN
10.0000 mg | Freq: Once | INTRAMUSCULAR | Status: AC
  Administered 2015-01-22: 10 mg via INTRAVENOUS

## 2015-01-22 MED ORDER — METOCLOPRAMIDE HCL 5 MG/ML IJ SOLN: 5.0000 mg | Freq: Three times a day (TID) | INTRAMUSCULAR | Status: DC | PRN

## 2015-01-22 MED ORDER — MORPHINE SULFATE 2 MG/ML IJ SOLN
1.0000 mg | INTRAMUSCULAR | Status: DC | PRN
  Administered 2015-01-22: 1 mg via INTRAVENOUS
  Filled 2015-01-22: qty 1

## 2015-01-22 MED ORDER — ALBUTEROL SULFATE 1.25 MG/3ML IN NEBU
1.0000 | INHALATION_SOLUTION | Freq: Four times a day (QID) | RESPIRATORY_TRACT | Status: DC | PRN
  Filled 2015-01-22: qty 3

## 2015-01-22 MED ORDER — PROMETHAZINE HCL 25 MG PO TABS: 25.0000 mg | ORAL_TABLET | Freq: Four times a day (QID) | ORAL | Status: DC | PRN

## 2015-01-22 MED ORDER — ACETAMINOPHEN 10 MG/ML IV SOLN
1000.0000 mg | Freq: Once | INTRAVENOUS | Status: AC
  Administered 2015-01-22: 1000 mg via INTRAVENOUS
  Filled 2015-01-22: qty 100

## 2015-01-22 SURGICAL SUPPLY — 56 items
BAG DECANTER FOR FLEXI CONT (MISCELLANEOUS) ×3 IMPLANT
BAG SPEC THK2 15X12 ZIP CLS (MISCELLANEOUS)
BAG ZIPLOCK 12X15 (MISCELLANEOUS) IMPLANT
BANDAGE ELASTIC 6 VELCRO ST LF (GAUZE/BANDAGES/DRESSINGS) ×3 IMPLANT
BANDAGE ESMARK 6X9 LF (GAUZE/BANDAGES/DRESSINGS) ×1 IMPLANT
BLADE SAW RECIPROCATING 77.5 (BLADE) ×3 IMPLANT
BLADE SAW SGTL 13.0X1.19X90.0M (BLADE) ×3 IMPLANT
BNDG CMPR 9X6 STRL LF SNTH (GAUZE/BANDAGES/DRESSINGS) ×1
BNDG ESMARK 6X9 LF (GAUZE/BANDAGES/DRESSINGS) ×3
BOWL SMART MIX CTS (DISPOSABLE) ×3 IMPLANT
BUR OVAL CARBIDE 4.0 (BURR) ×3 IMPLANT
CAPT KNEE PARTIAL 2 ×3 IMPLANT
CEMENT HV SMART SET (Cement) ×3 IMPLANT
CLOSURE WOUND 1/2 X4 (GAUZE/BANDAGES/DRESSINGS) ×1
CUFF TOURN SGL QUICK 34 (TOURNIQUET CUFF) ×3
CUFF TRNQT CYL 34X4X40X1 (TOURNIQUET CUFF) ×1 IMPLANT
DRAPE EXTREMITY T 121X128X90 (DRAPE) ×3 IMPLANT
DRAPE POUCH INSTRU U-SHP 10X18 (DRAPES) ×3 IMPLANT
DRSG ADAPTIC 3X8 NADH LF (GAUZE/BANDAGES/DRESSINGS) ×3 IMPLANT
DRSG PAD ABDOMINAL 8X10 ST (GAUZE/BANDAGES/DRESSINGS) ×3 IMPLANT
DURAPREP 26ML APPLICATOR (WOUND CARE) ×3 IMPLANT
ELECT REM PT RETURN 9FT ADLT (ELECTROSURGICAL) ×3
ELECTRODE REM PT RTRN 9FT ADLT (ELECTROSURGICAL) ×1 IMPLANT
EVACUATOR 1/8 PVC DRAIN (DRAIN) ×3 IMPLANT
FACESHIELD WRAPAROUND (MASK) ×15 IMPLANT
FACESHIELD WRAPAROUND OR TEAM (MASK) ×5 IMPLANT
GAUZE SPONGE 4X4 12PLY STRL (GAUZE/BANDAGES/DRESSINGS) ×3 IMPLANT
GLOVE BIO SURGEON STRL SZ7.5 (GLOVE) ×1 IMPLANT
GLOVE BIO SURGEON STRL SZ8 (GLOVE) ×3 IMPLANT
GLOVE BIOGEL PI IND STRL 8 (GLOVE) ×2 IMPLANT
GLOVE BIOGEL PI INDICATOR 8 (GLOVE) ×4
GOWN STRL REUS W/TWL LRG LVL3 (GOWN DISPOSABLE) ×3 IMPLANT
GOWN STRL REUS W/TWL XL LVL3 (GOWN DISPOSABLE) ×3 IMPLANT
HANDPIECE INTERPULSE COAX TIP (DISPOSABLE) ×3
IMMOBILIZER KNEE 20 (SOFTGOODS) ×5 IMPLANT
IMMOBILIZER KNEE 20 THIGH 36 (SOFTGOODS) ×1 IMPLANT
KIT BASIN OR (CUSTOM PROCEDURE TRAY) ×3 IMPLANT
KIT IMPL STRL TIB IPOLY IUNI IMPLANT
MANIFOLD NEPTUNE II (INSTRUMENTS) ×3 IMPLANT
NDL SAFETY ECLIPSE 18X1.5 (NEEDLE) ×2 IMPLANT
NEEDLE HYPO 18GX1.5 SHARP (NEEDLE) ×6
PACK TOTAL JOINT (CUSTOM PROCEDURE TRAY) ×3 IMPLANT
PADDING CAST COTTON 6X4 STRL (CAST SUPPLIES) ×4 IMPLANT
POSITIONER SURGICAL ARM (MISCELLANEOUS) ×3 IMPLANT
SET HNDPC FAN SPRY TIP SCT (DISPOSABLE) ×1 IMPLANT
STRIP CLOSURE SKIN 1/2X4 (GAUZE/BANDAGES/DRESSINGS) ×3 IMPLANT
SUCTION FRAZIER 12FR DISP (SUCTIONS) ×3 IMPLANT
SUT MNCRL AB 4-0 PS2 18 (SUTURE) ×3 IMPLANT
SUT VIC AB 2-0 CT1 27 (SUTURE) ×6
SUT VIC AB 2-0 CT1 TAPERPNT 27 (SUTURE) ×2 IMPLANT
SUT VLOC 180 0 24IN GS25 (SUTURE) ×3 IMPLANT
SYR 20CC LL (SYRINGE) ×3 IMPLANT
SYR 50ML LL SCALE MARK (SYRINGE) ×3 IMPLANT
TOWEL OR 17X26 10 PK STRL BLUE (TOWEL DISPOSABLE) ×3 IMPLANT
TRAY FOLEY W/METER SILVER 14FR (SET/KITS/TRAYS/PACK) ×3 IMPLANT
WRAP KNEE MAXI GEL POST OP (GAUZE/BANDAGES/DRESSINGS) ×2 IMPLANT

## 2015-01-22 NOTE — Anesthesia Procedure Notes (Signed)
Procedure Name: Intubation Date/Time: 01/22/2015 9:08 AM Performed by: Lajuana Carry E Pre-anesthesia Checklist: Patient identified, Emergency Drugs available, Suction available and Patient being monitored Patient Re-evaluated:Patient Re-evaluated prior to inductionOxygen Delivery Method: Circle System Utilized Preoxygenation: Pre-oxygenation with 100% oxygen Intubation Type: IV induction Ventilation: Mask ventilation without difficulty Laryngoscope Size: Miller and 2 Grade View: Grade I Tube type: Oral Tube size: 7.0 mm Number of attempts: 1 Airway Equipment and Method: Stylet Placement Confirmation: ETT inserted through vocal cords under direct vision,  positive ETCO2 and breath sounds checked- equal and bilateral Secured at: 21 cm Tube secured with: Tape Dental Injury: Teeth and Oropharynx as per pre-operative assessment

## 2015-01-22 NOTE — Evaluation (Signed)
Physical Therapy Evaluation Patient Details Name: [name] MRN: [id number] DOB: 1956/01/15 Today's Date: 01/22/2015   History of Present Illness   s/p L UKR  Clinical Impression  Pt admitted with above diagnosis. Pt currently with functional limitations due to the deficits listed below (see PT Problem List).  Pt will benefit from skilled PT to increase their independence and safety with mobility to allow discharge to the venue listed below.       Follow Up Recommendations Home health PT    Equipment Recommendations  None recommended by PT    Recommendations for Other Services       Precautions / Restrictions Precautions Precautions: Knee      Mobility  Bed Mobility Overal bed mobility: Needs Assistance Bed Mobility: Supine to Sit     Supine to sit: Min assist     General bed mobility comments: cues for technique and min assist with LLE  Transfers Overall transfer level: Needs assistance Equipment used: Rolling walker (2 wheeled) Transfers: Sit to/from Stand Sit to Stand: Min assist         General transfer comment: cues for hand placement  Ambulation/Gait Ambulation/Gait assistance: Min assist;Min guard Ambulation Distance (Feet): 120 Feet Assistive device: Rolling walker (2 wheeled) Gait Pattern/deviations: Step-to pattern;Trunk flexed     General Gait Details: cues for sequence  Stairs            Wheelchair Mobility    Modified Rankin (Stroke Patients Only)       Balance                                             Pertinent Vitals/Pain Pain Assessment: 0-10 Pain Score: 4  Pain Location: L knee Pain Descriptors / Indicators: Sore;Aching Pain Intervention(s): Limited activity within patient's tolerance;Monitored during session;Premedicated before session;Repositioned;Ice applied    Home Living Family/patient expects to be discharged to:: Private residence Living Arrangements: Spouse/significant  other Available Help at Discharge: Family Type of Home: House Home Access: Stairs to enter Entrance Stairs-Rails: None Entrance Stairs-Number of Steps: 2 Home Layout: Two level;Able to live on main level with bedroom/bathroom Home Equipment: [name] - single point;Walker - 2 wheels;Bedside commode      Prior Function Level of Independence: Independent               Hand Dominance        Extremity/Trunk Assessment   Upper Extremity Assessment: Defer to OT evaluation           Lower Extremity Assessment: LLE deficits/detail   LLE Deficits / Details: ankle WFL; hip flexion and knee extension 2+/5; AAROM ~5 to 40*     Communication   Communication: No difficulties  Cognition Arousal/Alertness: Awake/alert Behavior During Therapy: WFL for tasks assessed/performed Overall Cognitive Status: Within Functional Limits for tasks assessed                      General Comments      Exercises Total Joint Exercises Ankle Circles/Pumps: AROM;5 reps;Both Quad Sets: AROM;5 reps;Both      Assessment/Plan    PT Assessment Patient needs continued PT services  PT Diagnosis Difficulty walking   PT Problem List Decreased range of motion;Decreased activity tolerance;Decreased strength;Decreased mobility  PT Treatment Interventions DME instruction;Gait training;Stair training;Functional mobility training;Therapeutic activities;Therapeutic exercise;Patient/family education   PT Goals (Current goals can be found in the  Care Plan section) Acute Rehab PT Goals Patient Stated Goal: home asap PT Goal Formulation: With patient Time For Goal Achievement: 01/24/15 Potential to Achieve Goals: Good    Frequency 7X/week   Barriers to discharge        Co-evaluation               End of Session Equipment Utilized During Treatment: Gait belt Activity Tolerance: Patient tolerated treatment well Patient left: in chair;with call bell/phone within reach;with family/visitor  present Nurse Communication: Mobility status         Time: 6659-9357 PT Time Calculation (min) (ACUTE ONLY): 20 min   Charges:   PT Evaluation $Initial PT Evaluation Tier I: 1 Procedure     PT G CodesKenyon Rodriguez 01-25-2015, 4:53 PM

## 2015-01-22 NOTE — Anesthesia Postprocedure Evaluation (Signed)
  Anesthesia Post-op Note  Patient: [name]  Procedure(s) Performed: Procedure(s) (LRB): LEFT KNEE MEDIAL UNICOMPARTMENTAL KNEE (Left)  Patient Location: PACU  Anesthesia Type: General  Level of Consciousness: awake and alert   Airway and Oxygen Therapy: Patient Spontanous Breathing  Post-op Pain: mild  Post-op Assessment: Post-op Vital signs reviewed, Patient's Cardiovascular Status Stable, Respiratory Function Stable, Patent Airway and No signs of Nausea or vomiting  Last Vitals:  Filed Vitals:   01/22/15 1130  BP: 118/54  Pulse: 65  Temp: 36.9 C  Resp: 15    Post-op Vital Signs: stable   Complications: No apparent anesthesia complications

## 2015-01-22 NOTE — Interval H&P Note (Signed)
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 After consideration of risks, benefits and other options for treatment, the patient has consented to  Procedure(s): LEFT KNEE MEDIAL UNICOMPARTMENTAL KNEE (Left) as a surgical intervention .  The patient's history has been reviewed, patient examined, no change in status, stable for surgery.  I have reviewed the patient's chart and labs.  Questions were answered to the patient's satisfaction.     Gearlean Alf

## 2015-01-22 NOTE — Op Note (Signed)
OPERATIVE REPORT  PREOPERATIVE DIAGNOSIS: Medial compartment osteoarthritis, Left knee  POSTOPERATIVE DIAGNOSIS: Medial compartment osteoarthritis, Left knee  PROCEDURE:Left knee medial unicompartmental arthroplasty.   SURGEON: Gaynelle Arabian, MD   ASSISTANT: Molli Barrows, PA-C  ANESTHESIA:  General.   ESTIMATED BLOOD LOSS: Minimal.   DRAINS: Hemovac x1.   TOURNIQUET TIME: 32 minutes at 300 mmHg.   COMPLICATIONS: None.   CONDITION: Stable to recovery.   BRIEF CLINICAL NOTE:[name] is a 59 y.o. female, who has  significant isolated medial compartment arthritis of the Left knee. She has had nonoperative management including injections. She has had  cortisone and viscous supplements. Unfortunately, the pain persists.  Radiograph showed isolated medial compartment bone-on-bone arthritis  with normal-appearing patellofemoral and lateral compartments. She  presents now for left knee unicompartmental arthroplasty.   PROCEDURE IN DETAIL: After successful administration of  General anesthetic, a tourniquet was placed high on the  Left thigh and left lower extremity prepped and draped in usual sterile fashion. Extremity was wrapped in an Esmarch, knee flexed, and tourniquet inflated to 300 mmHg. A midline incision was made with a 10 blade through subcutaneous  tissue to the extensor mechanism. A fresh blade was used to make a  medial parapatellar arthrotomy. Soft tissue on the proximal medial  tibia subperiosteally elevated to the joint line with a knife and into  the semimembranosus bursa with a Cobb elevator. The patella was  subluxed laterally, and the knee flexed 90 degrees. The ACL was intact.  The marginal osteophytes on the medial femur and tibia were removed with  a rongeur. The medial meniscus was also removed. The femoral cutting  block where the conformis unicompartmental knee system was placed along  the femur. There was excellent fit. I traced the  outline. We then  removed any remaining cartilage within this outline. We then placed the  cutting block again and pinned in position. The posterior femoral cut  was made, it was approximately 5 mm. The lug holes for the femoral  component were then drilled through the cutting block. The cutting  block was subsequently removed. We then utilized the high speed burr to  create a small trough at the superior aspect of the components that of  the inset and would not overhang the cartilage. The trial was placed,  it had excellent fit. The trial was subsequently removed.       The trial was placed again and the B chip was placed. There was  excellent balance throughout full motion. Also with excellent fit on  her tibia. This was removed as was the femoral trial. A curette was  used to remove any remaining cartilage from the tibia. The tibial  cutting block was then placed and there was a perfect fit on the tibial  surface. The appropriate slope was placed and it was pinned in  position. The reciprocating saw was used to make the central cut and  then the oscillating saw used to make the horizontal cut. The bone  fragment was then removed. The tibial trial was placed and had perfect  fit on the tibia. We then drilled the 2 lug holes and did the keel punch.  We then placed tibia trial femur, and a 8 mm trial insert. There was  excellent stability throughout full range of motion and no impingement.  The trial was then removed. We drilled small holes in the distal  femur in order to create more conduits for the cement. The cut bone  surfaces were  thoroughly irrigated with pulsatile lavage while the  cement was mixed on the back table. We then cemented the tibial  component into place, impacted it and removed the extruded cement. The  same was done for the femoral component. Trial 8-mm inserts placed,  knee held in full extension, and all extruded cement removed. While the  cement was hardening, I  injected the extensor mechanism, periosteum of  the femur and subcu tissues, a total of 20 mL of Exparel mixed with 30  mL of saline and then did an additional injection of 20 mL of 0.25%  Marcaine into the same tissues. When the cement had fully hardened,  then the permanent polyethylene was placed in tibial tray. There was  excellent stability throughout full range of motion with no lift off the  component and no evidence of any impingement. Wound was copiously  irrigated with saline solution, and the arthrotomy closed over a Hemovac  drain with a running #1 V-Loc suture. The subcutaneous was closed with  interrupted 2-0 Vicryl and subcuticular running 4-0 Monocryl. The drain  was hooked to suction. Incision cleaned and dried and Steri-Strips and  a bulky sterile dressing applied. The tourniquet was released after a  total time of 32 minutes. This was done after closing the extensor  mechanism. The wound was closed and a bulky sterile dressing was  applied. She was placed into a knee immobilizer, awakened and  transported to recovery room in stable condition.  Please note that a surgical assistant was a medical necessity for this  procedure in order to perform it in a safe and expeditious manner.  Assistance was necessary for retracting vital ligaments, neurovascular  structures, as well as for proper positioning of the limb to allow for  appropriate bone cuts and appropriate placement of the prosthesis.    Dione Plover Shuna Tabor, MD

## 2015-01-22 NOTE — Transfer of Care (Signed)
Immediate Anesthesia Transfer of Care Note  Patient: [name]  Procedure(s) Performed: Procedure(s): LEFT KNEE MEDIAL UNICOMPARTMENTAL KNEE (Left)  Patient Location: PACU  Anesthesia Type:General  Level of Consciousness: awake, alert  and oriented  Airway & Oxygen Therapy: Patient Spontanous Breathing and Patient connected to face mask oxygen  Post-op Assessment: Report given to RN and Post -op Vital signs reviewed and stable  Post vital signs: Reviewed and stable  Last Vitals:  Filed Vitals:   01/22/15 0622  BP: 131/66  Pulse: 73  Temp: 36.9 C  Resp: 18    Complications: No apparent anesthesia complications

## 2015-01-22 NOTE — Anesthesia Preprocedure Evaluation (Addendum)
Anesthesia Evaluation  Patient identified by MRN, date of birth, ID band Patient awake    Reviewed: Allergy & Precautions, H&P , NPO status , Patient's Chart, lab work & pertinent test results  History of Anesthesia Complications (+) PONV and history of anesthetic complications  Airway Mallampati: II  TM Distance: >3 FB Neck ROM: Full    Dental  (+) Dental Advisory Given, Caps, Implants Upper right front and all teeth lateral to it are capped or implant:   Pulmonary COPD COPD inhaler, Current Smoker,  breath sounds clear to auscultation  Pulmonary exam normal       Cardiovascular negative cardio ROS Normal cardiovascular examRhythm:Regular Rate:Normal     Neuro/Psych Anxiety Multiple Sclerosis negative neurological ROS  negative psych ROS   GI/Hepatic negative GI ROS, Neg liver ROS,   Endo/Other  diabetes, Type 2, Oral Hypoglycemic Agents  Renal/GU negative Renal ROS  negative genitourinary   Musculoskeletal negative musculoskeletal ROS (+)   Abdominal   Peds negative pediatric ROS (+)  Hematology negative hematology ROS (+)   Anesthesia Other Findings Upper front right crowns  Reproductive/Obstetrics negative OB ROS                            Anesthesia Physical Anesthesia Plan  ASA: III  Anesthesia Plan: General   Post-op Pain Management:    Induction: Intravenous  Airway Management Planned: Oral ETT  Additional Equipment:   Intra-op Plan:   Post-operative Plan: Extubation in OR  Informed Consent:   Plan Discussed with: Surgeon  Anesthesia Plan Comments:         Anesthesia Quick Evaluation

## 2015-01-23 LAB — BASIC METABOLIC PANEL
Anion gap: 7 (ref 5–15)
BUN: 10 mg/dL (ref 6–20)
CO2: 27 mmol/L (ref 22–32)
CREATININE: 0.69 mg/dL (ref 0.44–1.00)
Calcium: 8.6 mg/dL — ABNORMAL LOW (ref 8.9–10.3)
Chloride: 106 mmol/L (ref 101–111)
GFR calc non Af Amer: >60 mL/min (ref >60)
Glucose, Bld: 160 mg/dL — ABNORMAL HIGH (ref 65–99)
Potassium: 4.4 mmol/L (ref 3.5–5.1)
Sodium: 140 mmol/L (ref 135–145)

## 2015-01-23 LAB — CBC
HCT: 39 % (ref 36.0–46.0)
HEMOGLOBIN: 12.4 g/dL (ref 12.0–15.0)
MCH: 28.6 pg (ref 26.0–34.0)
MCHC: 31.8 g/dL (ref 30.0–36.0)
MCV: 90.1 fL (ref 78.0–100.0)
PLATELETS: 240 10*3/uL (ref 150–400)
RBC: 4.33 MIL/uL (ref 3.87–5.11)
RDW: 12.7 % (ref 11.5–15.5)
WBC: 14.1 10*3/uL — ABNORMAL HIGH (ref 4.0–10.5)

## 2015-01-23 LAB — GLUCOSE, CAPILLARY: Glucose-Capillary: 128 mg/dL — ABNORMAL HIGH (ref 65–99)

## 2015-01-23 MED ORDER — TRAMADOL HCL 50 MG PO TABS: 50.0000 mg | ORAL_TABLET | Freq: Four times a day (QID) | ORAL | Status: DC | PRN

## 2015-01-23 MED ORDER — METHOCARBAMOL 500 MG PO TABS: 500.0000 mg | ORAL_TABLET | Freq: Four times a day (QID) | ORAL | Status: DC | PRN

## 2015-01-23 MED ORDER — ONDANSETRON HCL 4 MG PO TABS: 4.0000 mg | ORAL_TABLET | Freq: Four times a day (QID) | ORAL | Status: DC | PRN

## 2015-01-23 MED ORDER — RIVAROXABAN 10 MG PO TABS: 10.0000 mg | ORAL_TABLET | Freq: Every day | ORAL | Status: DC

## 2015-01-23 MED ORDER — OXYCODONE HCL 5 MG PO TABS: 5.0000 mg | ORAL_TABLET | ORAL | Status: DC | PRN

## 2015-01-23 NOTE — Progress Notes (Signed)
Physical Therapy Treatment Patient Details Name: [name] MRN: [id number] DOB: 1955-12-04 Today's Date: 01/23/2015    History of Present Illness s/p L UKR    PT Comments    POD # 1 pt dressed and eager to D/C to home.  Assisted with amb in hallway, practiced one step and reviewed HEP.  Instructed on use of ICE and KI use.  All other mobility questions addressed.    Follow Up Recommendations  Home health PT     Equipment Recommendations  None recommended by PT    Recommendations for Other Services       Precautions / Restrictions Precautions Precautions: Knee Restrictions Weight Bearing Restrictions: No    Mobility  Bed Mobility               General bed mobility comments: Pt OOB in recliner  Transfers Overall transfer level: Needs assistance Equipment used: Rolling walker (2 wheeled) Transfers: Sit to/from Stand Sit to Stand: Modified independent (Device/Increase time)         General transfer comment: good safety cognition  Ambulation/Gait Ambulation/Gait assistance: Modified independent (Device/Increase time);Supervision Ambulation Distance (Feet): 215 Feet Assistive device: Rolling walker (2 wheeled) Gait Pattern/deviations: Step-through pattern;Decreased stride length Gait velocity: decreased but functional   General Gait Details: caution to L toe catch on floor while wearing L KI   Stairs Stairs: Yes Stairs assistance: Supervision Stair Management: No rails;Step to pattern;Forwards Number of Stairs: 1 General stair comments: one initial VC on proper tech and safety with walker placement  Wheelchair Mobility    Modified Rankin (Stroke Patients Only)       Balance                                    Cognition Arousal/Alertness: Awake/alert Behavior During Therapy: WFL for tasks assessed/performed Overall Cognitive Status: Within Functional Limits for tasks assessed                      Exercises  given  handout HEP and reviewed    General Comments        Pertinent Vitals/Pain Pain Assessment: 0-10 Pain Score: 5  Pain Location: L knee Pain Descriptors / Indicators: Aching;Sore Pain Intervention(s): Monitored during session;Repositioned;Ice applied    Home Living                      Prior Function            PT Goals (current goals can now be found in the care plan section) Progress towards PT goals: Progressing toward goals    Frequency  7X/week    PT Plan      Co-evaluation             End of Session Equipment Utilized During Treatment: Gait belt Activity Tolerance: Patient tolerated treatment well Patient left: in chair;with call bell/phone within reach;with family/visitor present     Time: 0935-1000 PT Time Calculation (min) (ACUTE ONLY): 25 min  Charges:  $Gait Training: 8-22 mins $Therapeutic Activity: 8-22 mins                    G Codes:      [name]  PTA [facility]  Acute  Rehab Pager      [phone]

## 2015-01-23 NOTE — Discharge Instructions (Signed)
Dr. Gaynelle Arabian Total Joint Specialist [facility] [address], [place], [place] [address] [phone]  UNI KNEE REPLACEMENT POSTOPERATIVE DIRECTIONS   Knee Rehabilitation, Guidelines Following Surgery  Results after knee surgery are often greatly improved when you follow the exercise, range of motion and muscle strengthening exercises prescribed by your doctor. Safety measures are also important to protect the knee from further injury. Any time any of these exercises cause you to have increased pain or swelling in your knee joint, decrease the amount until you are comfortable again and slowly increase them. If you have problems or questions, call your caregiver or physical therapist for advice.   HOME CARE INSTRUCTIONS  Remove items at home which could result in a fall. This includes throw rugs or furniture in walking pathways.  Continue medications as instructed at time of discharge. You may have some home medications which will be placed on hold until you complete the course of blood thinner medication.  You may start showering once you are discharged home but do not submerge the incision under water. Just pat the incision dry and apply a dry gauze dressing on daily. Walk with walker as instructed.  You may resume a sexual relationship in one month or when given the OK by  your doctor.   Use walker as long as suggested by your caregivers.  Avoid periods of inactivity such as sitting longer than an hour when not asleep. This helps prevent blood clots.  You may return to work once you are cleared by your doctor.  Do not drive a car for 6 weeks or until released by you surgeon.   Do not drive while taking narcotics.  Wear the elastic stockings for three weeks following surgery during the day but you may remove then at night. Make sure you keep all of your appointments after your operation with all of your doctors and caregivers. You should call the office  at the above phone number and make an appointment for approximately two weeks after the date of your surgery. Change the dressing daily and reapply a dry dressing each time. Please pick up a stool softener and laxative for home use as long as you are requiring pain medications.  ICE to the affected knee every three hours for 30 minutes at a time and then as needed for pain and swelling.  Continue to use ice on the knee for pain and swelling from surgery. You may notice swelling that will progress down to the foot and ankle.  This is normal after surgery.  Elevate the leg when you are not up walking on it.   It is important for you to complete the blood thinner medication as prescribed by your doctor.  Continue to use the breathing machine which will help keep your temperature down.  It is common for your temperature to cycle up and down following surgery, especially at night when you are not up moving around and exerting yourself.  The breathing machine keeps your lungs expanded and your temperature down.  RANGE OF MOTION AND STRENGTHENING EXERCISES  Rehabilitation of the knee is important following a knee injury or an operation. After just a few days of immobilization, the muscles of the thigh which control the knee become weakened and shrink (atrophy). Knee exercises are designed to build up the tone and strength of the thigh muscles and to improve knee motion. Often times heat used for twenty to thirty minutes before working out will loosen up your  tissues and help with improving the range of motion but do not use heat for the first two weeks following surgery. These exercises can be done on a training (exercise) mat, on the floor, on a table or on a bed. Use what ever works the best and is most comfortable for you Knee exercises include:  Leg Lifts - While your knee is still immobilized in a splint or cast, you can do straight leg raises. Lift the leg to 60 degrees, hold for 3 sec, and slowly lower  the leg. Repeat 10-20 times 2-3 times daily. Perform this exercise against resistance later as your knee gets better.  Quad and Hamstring Sets - Tighten up the muscle on the front of the thigh (Quad) and hold for 5-10 sec. Repeat this 10-20 times hourly. Hamstring sets are done by pushing the foot backward against an object and holding for 5-10 sec. Repeat as with quad sets.  A rehabilitation program following serious knee injuries can speed recovery and prevent re-injury in the future due to weakened muscles. Contact your doctor or a physical therapist for more information on knee rehabilitation.   SKILLED REHAB INSTRUCTIONS: If the patient is transferred to a skilled rehab facility following release from the hospital, a list of the current medications will be sent to the facility for the patient to continue.  When discharged from the skilled rehab facility, please have the facility set up the patient's [place] prior to being released. Also, the skilled facility will be responsible for providing the patient with their medications at time of release from the facility to include their pain medication, the muscle relaxants, and their blood thinner medication. If the patient is still at the rehab facility at time of the two week follow up appointment, the skilled rehab facility will also need to assist the patient in arranging follow up appointment in our office and any transportation needs.  MAKE SURE YOU:  Understand these instructions.  Will watch your condition.  Will get help right away if you are not doing well or get worse.    Pick up stool softner and laxative for home use following surgery while on pain medications. Do not submerge incision under water. Please use good hand washing techniques while changing dressing each day. May shower starting three days after surgery. Please use a clean towel to pat the incision dry following showers. Continue to use ice for pain and  swelling after surgery. Do not use any lotions or creams on the incision until instructed by your surgeon.  Take Xarelto for a total of 10 days, then discontinue Xarelto. After completing the Xarelto, take an 81 mg Aspirin for three more weeks.

## 2015-01-23 NOTE — Progress Notes (Signed)
OT Cancellation Note  Patient Details Name: [name] MRN: [id number] DOB: [birth year]   Cancelled Treatment:    Reason Eval/Treat Not Completed: OT screened, no needs identified, will sign off . This is pt's third knee sx and she feels comfortable with ADLs/toilet transfers.  Has tub shower which she will use eventually.  Kier Smead 01/23/2015, 10:39 AM  Lesle Chris, OTR/L [phone] 01/23/2015

## 2015-01-23 NOTE — Care Management Note (Signed)
Case Management Note  Patient Details  Name: JAELY SILMAN MRN: [id number] Date of Birth: January 25, 1956  Subjective/Objective:                  inpatient surg  Action/Plan: home  Expected Discharge Date:                  Expected Discharge Plan:  Home/Self Care  In-House Referral:  NA  Discharge planning Services  CM Consult  Post Acute Care Choice:  NA Choice offered to:  NA  DME Arranged:  N/A DME Agency:  NA  HH Arranged:  NA HH Agency:     Status of Service:     Medicare Important Message Given:    Date Medicare IM Given:    Medicare IM give by:    Date Additional Medicare IM Given:    Additional Medicare Important Message give by:     If discussed at [place] of Stay Meetings, dates discussed:    Additional Comments:  Leeroy Cha, RN 01/23/2015, 10:55 AM

## 2015-01-23 NOTE — Progress Notes (Signed)
Date:  January 23, 2015 U.R. performed for needs and level of care. Will continue to follow for Case Management needs.  Velva Harman, RN, BSN, [place]   [phone]

## 2015-01-23 NOTE — Progress Notes (Signed)
Date:  January 23, 2015 U.R. performed for needs and level of care. Will continue to follow for Case Management needs.  Velva Harman, RN, BSN, [place]   [phone]

## 2015-01-23 NOTE — Progress Notes (Signed)
   Subjective: 1 Day Post-Op Procedure(s) (LRB): LEFT KNEE MEDIAL UNICOMPARTMENTAL KNEE (Left) Patient reports pain as mild.   Plan is to go Home after hospital stay.  Objective: Vital signs in last 24 hours: Temp:  [97.3 F (36.3 C)-98.4 F (36.9 C)] 97.8 F (36.6 C) (06/14 0545) Pulse Rate:  [42-86] 46 (06/14 0545) Resp:  [12-21] 12 (06/14 0545) BP: (102-146)/(43-70) 109/52 mmHg (06/14 0545) SpO2:  [98 %-100 %] 99 % (06/14 0545)  Intake/Output from previous day:  Intake/Output Summary (Last 24 hours) at 01/23/15 0750 Last data filed at 01/23/15 9622  Gross per 24 hour  Intake   3620 ml  Output   3920 ml  Net   -300 ml    Intake/Output this shift:    Labs:  Recent Labs  01/23/15 0421  HGB 12.4    Recent Labs  01/23/15 0421  WBC 14.1*  RBC 4.33  HCT 39.0  PLT 240    Recent Labs  01/23/15 0421  NA 140  K 4.4  CL 106  CO2 27  BUN 10  CREATININE 0.69  GLUCOSE 160*  CALCIUM 8.6*   No results for input(s): LABPT, INR in the last 72 hours.  EXAM General - Patient is Alert, Appropriate and Oriented Extremity - Neurologically intact Neurovascular intact No cellulitis present Compartment soft Dressing - dressing C/D/I Motor Function - intact, moving foot and toes well on exam.  Hemovac pulled without difficulty.  Past Medical History  Diagnosis Date  . Multiple sclerosis   . Type 2 diabetes mellitus   . GERD (gastroesophageal reflux disease)   . COPD (chronic obstructive pulmonary disease)   . Mixed hyperlipidemia   . PONV (postoperative nausea and vomiting)   . Weakness of left side of body     DUE TO MS  . Arthritis   . Psoriasis   . Neuropathy     FEET  . Neuromuscular disorder     MIGRAINES  . History of skin cancer   . Anxiety   . Cancer     SKIN / "BORDERLINE CERVICAL CANCER"    Assessment/Plan: 1 Day Post-Op Procedure(s) (LRB): LEFT KNEE MEDIAL UNICOMPARTMENTAL KNEE (Left) Principal Problem:   OA (osteoarthritis) of  knee Patient was bradycardic throughout night but asymptomatic and BP was normal. If any symptoms today with PT will evaluate further  Advance diet Up with therapy D/C IV fluids Discharge home with home health  DVT Prophylaxis - Xarelto Weight-Bearing as tolerated to left leg   Shonteria Abeln V 01/23/2015, 7:50 AM

## 2015-01-23 NOTE — Discharge Summary (Signed)
Physician Discharge Summary   Patient ID: [name] MRN: [id number] DOB/AGE: [birth year] 57 y.o.  Admit date: 01/22/2015 Discharge date: 01/23/2015  Primary Diagnosis: Primary osteoarthritis, medial compartment left knee   Admission Diagnoses:  Past Medical History  Diagnosis Date  . Multiple sclerosis   . Type 2 diabetes mellitus   . GERD (gastroesophageal reflux disease)   . COPD (chronic obstructive pulmonary disease)   . Mixed hyperlipidemia   . PONV (postoperative nausea and vomiting)   . Weakness of left side of body     DUE TO MS  . Arthritis   . Psoriasis   . Neuropathy     FEET  . Neuromuscular disorder     MIGRAINES  . History of skin cancer   . Anxiety   . Cancer     SKIN / "BORDERLINE CERVICAL CANCER"   Discharge Diagnoses:   Principal Problem:   OA (osteoarthritis) of knee  Estimated body mass index is 32.28 kg/(m^2) as calculated from the following:   Height as of this encounter: 5' 10"  (1.778 m).   Weight as of this encounter: 102.059 kg (225 lb).  Procedure:  Procedure(s) (LRB): LEFT KNEE MEDIAL UNICOMPARTMENTAL KNEE (Left)   Consults: None  HPI: She has considerable pain along the medial aspect of that left knee. The Euflexxa did not provide a tremendous amount of benefit. It is getting harder for her to get around and do things she desires.She has significant medial compartment arthritis with really worsening symptoms. At this point now the most predictable means of improving her pain and function would be arthroplasty. I feel she is an excellent candidate for unicompartmental replacement as everything is isolated to the medial side, she is young, she wants to be more active, and has intact ligaments. I recommend going ahead and doing a unicompartmental replacement. We discussed this in detail, including differences between a unicompartmental and a total knee. She would like to go ahead and proceed with the unicompartmental replacement. They have  been treated conservatively in the past for the above stated problem and despite conservative measures, they continue to have progressive pain and severe functional limitations and dysfunction. They have failed non-operative management including home exercise, medications, and injections. It is felt that they would benefit from undergoing total joint replacement. Risks and benefits of the procedure have been discussed with the patient and they elect to proceed with surgery. There are no active contraindications to surgery such as ongoing infection or rapidly progressive neurological disease.  Laboratory Data: Admission on 01/22/2015, Discharged on 01/23/2015  Component Date Value Ref Range Status  . ABO/RH(D) 01/22/2015 O POS   Final  . Antibody Screen 01/22/2015 NEG   Final  . Sample Expiration 01/22/2015 01/25/2015   Final  . Glucose-Capillary 01/22/2015 114* 65 - 99 mg/dL Final  . Comment 1 01/22/2015 Notify RN   Final  . Glucose-Capillary 01/22/2015 133* 65 - 99 mg/dL Final  . Glucose-Capillary 01/22/2015 147* 65 - 99 mg/dL Final  . WBC 01/23/2015 14.1* 4.0 - 10.5 K/uL Final  . RBC 01/23/2015 4.33  3.87 - 5.11 MIL/uL Final  . Hemoglobin 01/23/2015 12.4  12.0 - 15.0 g/dL Final  . HCT 01/23/2015 39.0  36.0 - 46.0 % Final  . MCV 01/23/2015 90.1  78.0 - 100.0 fL Final  . MCH 01/23/2015 28.6  26.0 - 34.0 pg Final  . MCHC 01/23/2015 31.8  30.0 - 36.0 g/dL Final  . RDW 01/23/2015 12.7  11.5 - 15.5 % Final  .  Platelets 01/23/2015 240  150 - 400 K/uL Final  . Sodium 01/23/2015 140  135 - 145 mmol/L Final  . Potassium 01/23/2015 4.4  3.5 - 5.1 mmol/L Final  . Chloride 01/23/2015 106  101 - 111 mmol/L Final  . CO2 01/23/2015 27  22 - 32 mmol/L Final  . Glucose, Bld 01/23/2015 160* 65 - 99 mg/dL Final  . BUN 01/23/2015 10  6 - 20 mg/dL Final  . Creatinine, Ser 01/23/2015 0.69  0.44 - 1.00 mg/dL Final  . Calcium 01/23/2015 8.6* 8.9 - 10.3 mg/dL Final  . GFR calc non Af Amer 01/23/2015 >60  >60  mL/min Final  . GFR calc Af Amer 01/23/2015 >60  >60 mL/min Final   Comment: (NOTE) The eGFR has been calculated using the CKD EPI equation. This calculation has not been validated in all clinical situations. eGFR's persistently <60 mL/min signify possible Chronic Kidney Disease.   . Anion gap 01/23/2015 7  5 - 15 Final  . Glucose-Capillary 01/22/2015 245* 65 - 99 mg/dL Final  . Glucose-Capillary 01/22/2015 208* 65 - 99 mg/dL Final  . Glucose-Capillary 01/23/2015 128* 65 - 99 mg/dL Final  Hospital Outpatient Visit on 01/12/2015  Component Date Value Ref Range Status  . MRSA, PCR 01/12/2015 NEGATIVE  NEGATIVE Final  . Staphylococcus aureus 01/12/2015 NEGATIVE  NEGATIVE Final   Comment:        The Xpert SA Assay (FDA approved for NASAL specimens in patients over 56 years of age), is one component of a comprehensive surveillance program.  Test performance has been validated by [facility] for patients greater than or equal to 71 year old. It is not intended to diagnose infection nor to guide or monitor treatment.   Marland Kitchen aPTT 01/12/2015 30  24 - 37 seconds Final  . WBC 01/12/2015 9.2  4.0 - 10.5 K/uL Final  . RBC 01/12/2015 4.53  3.87 - 5.11 MIL/uL Final  . Hemoglobin 01/12/2015 13.0  12.0 - 15.0 g/dL Final  . HCT 01/12/2015 40.7  36.0 - 46.0 % Final  . MCV 01/12/2015 89.8  78.0 - 100.0 fL Final  . MCH 01/12/2015 28.7  26.0 - 34.0 pg Final  . MCHC 01/12/2015 31.9  30.0 - 36.0 g/dL Final  . RDW 01/12/2015 12.9  11.5 - 15.5 % Final  . Platelets 01/12/2015 226  150 - 400 K/uL Final  . Prothrombin Time 01/12/2015 14.5  11.6 - 15.2 seconds Final  . INR 01/12/2015 1.11  0.00 - 1.49 Final  . Color, Urine 01/12/2015 YELLOW  YELLOW Final  . APPearance 01/12/2015 CLOUDY* CLEAR Final  . Specific Gravity, Urine 01/12/2015 1.019  1.005 - 1.030 Final  . pH 01/12/2015 5.0  5.0 - 8.0 Final  . Glucose, UA 01/12/2015 NEGATIVE  NEGATIVE mg/dL Final  . Hgb urine dipstick 01/12/2015 TRACE* NEGATIVE  Final  . Bilirubin Urine 01/12/2015 NEGATIVE  NEGATIVE Final  . Ketones, ur 01/12/2015 NEGATIVE  NEGATIVE mg/dL Final  . Protein, ur 01/12/2015 NEGATIVE  NEGATIVE mg/dL Final  . Urobilinogen, UA 01/12/2015 0.2  0.0 - 1.0 mg/dL Final  . Nitrite 01/12/2015 NEGATIVE  NEGATIVE Final  . Leukocytes, UA 01/12/2015 LARGE* NEGATIVE Final  . Squamous Epithelial / LPF 01/12/2015 FEW* RARE Final  . WBC, UA 01/12/2015 TOO NUMEROUS TO COUNT  <3 WBC/hpf Final  . Bacteria, UA 01/12/2015 MANY* RARE Final  . Urine-Other 01/12/2015 MUCOUS PRESENT   Final  Office Visit on 01/10/2015  Component Date Value Ref Range Status  . Glucose 01/10/2015 113*  65 - 99 mg/dL Final  . BUN 01/10/2015 10  6 - 24 mg/dL Final  . Creatinine, Ser 01/10/2015 0.75  0.57 - 1.00 mg/dL Final  . GFR calc non Af Amer 01/10/2015 89  >59 mL/min/1.73 Final  . GFR calc Af Amer 01/10/2015 103  >59 mL/min/1.73 Final  . BUN/Creatinine Ratio 01/10/2015 13  9 - 23 Final  . Sodium 01/10/2015 142  134 - 144 mmol/L Final  . Potassium 01/10/2015 4.3  3.5 - 5.2 mmol/L Final  . Chloride 01/10/2015 100  97 - 108 mmol/L Final  . CO2 01/10/2015 27  18 - 29 mmol/L Final  . Calcium 01/10/2015 9.1  8.7 - 10.2 mg/dL Final  . Total Protein 01/10/2015 6.2  6.0 - 8.5 g/dL Final  . Albumin 01/10/2015 4.0  3.5 - 5.5 g/dL Final  . Globulin, Total 01/10/2015 2.2  1.5 - 4.5 g/dL Final  . Albumin/Globulin Ratio 01/10/2015 1.8  1.1 - 2.5 Final  . Bilirubin Total 01/10/2015 0.6  0.0 - 1.2 mg/dL Final  . Alkaline Phosphatase 01/10/2015 64  39 - 117 IU/L Final  . AST 01/10/2015 17  0 - 40 IU/L Final  . ALT 01/10/2015 16  0 - 32 IU/L Final  . LDL Particle Number 01/10/2015 2433* <1000 nmol/L Final   Comment:                           Low                   < 1000                           Moderate         1000 - 1299                           Borderline-High  1300 - 1599                           High             1600 - 2000                           Very High              > 2000   . LDL-C 01/10/2015 177* 0 - 99 mg/dL Final   Comment:                           Optimal               <  100                           Above optimal     100 -  129                           Borderline        130 -  159                           High              160 -  189                           Very high             >  189 LDL-C is inaccurate if patient is non-fasting.   Marland Kitchen HDL Cholesterol by NMR 01/10/2015 43  >39 mg/dL Final  . Triglycerides by NMR 01/10/2015 121  0 - 149 mg/dL Final  . Cholesterol 01/10/2015 244* 100 - 199 mg/dL Final  . HDL Particle Number 01/10/2015 29.0* >=30.5 umol/L Final  . Small LDL Particle Number 01/10/2015 1435* <=527 nmol/L Final  . LDL Size 01/10/2015 20.4  >20.5 nm Final   Comment:  ----------------------------------------------------------                  ** INTERPRETATIVE INFORMATION**                  PARTICLE CONCENTRATION AND SIZE                     <--Lower CVD Risk   Higher CVD Risk-->   LDL AND HDL PARTICLES   Percentile in Reference Population   HDL-P (total)        High     75th    50th    25th   Low                        >34.9    34.9    30.5    26.7   <26.7   Small LDL-P          Low      25th    50th    75th   High                        <117     117     527     839    >839   LDL Size   <-Large (Pattern A)->    <-Small (Pattern B)->                     23.0    20.6           20.5      19.0  ---------------------------------------------------------- Small LDL-P and LDL Size are associated with CVD risk, but not after LDL-P is taken into account. These assays were developed and their performance characteristics determined by [employer]. These assays have not been cleared by the [place] Food and Drug Administration. The clinical utility o                          f these laboratory values have not been fully established.   Marland Kitchen LP-IR Score 01/10/2015 76* <=45 Final   Comment: INSULIN RESISTANCE MARKER     <--Insulin  Sensitive    Insulin Resistant-->            Percentile in Reference Population Insulin Resistance Score LP-IR Score   Low   25th   50th   75th   High               <27   27     45     63     >63 LP-IR Score is inaccurate if patient is non-fasting. The LP-IR score is a laboratory developed index that has been associated with  insulin resistance and diabetes risk and should be used as one component of a physician's clinical assessment. The LP-IR score listed above has not been cleared by the [place] Food and Drug Administration.   . Hemoglobin A1C 01/10/2015 6.0   Final   4.8-5.6% NORMAL RANGE      Hospital Course: [name] is a 59 y.o. who was admitted to [facility]. They were brought to the operating room on 01/22/2015 and underwent Procedure(s): LEFT KNEE MEDIAL UNICOMPARTMENTAL KNEE.  Patient tolerated the procedure well and was later transferred to the recovery room and then to the orthopaedic floor for postoperative care.  They were given PO and IV analgesics for pain control following their surgery.  They were given 24 hours of postoperative antibiotics of  Anti-infectives    Start     Dose/Rate Route Frequency Ordered Stop   01/22/15 2000  vancomycin (VANCOCIN) IVPB 1000 mg/200 mL premix     1,000 mg 200 mL/hr over 60 Minutes Intravenous Every 12 hours 01/22/15 1157 01/22/15 2118   01/22/15 0600  vancomycin (VANCOCIN) 1,500 mg in sodium chloride 0.9 % 500 mL IVPB     1,500 mg 250 mL/hr over 120 Minutes Intravenous On call to O.R. 01/21/15 1813 01/22/15 1009     and started on DVT prophylaxis in the form of Xarelto.   PT and OT were ordered.  Discharge planning consulted to help with postop disposition and equipment needs.  Patient had a good night on the evening of surgery.  They started to get up OOB with therapy on day one. Hemovac drain was pulled without difficulty. Patient was seen in rounds and was ready to go home after completion of PT.   Diet: Diabetic  diet Activity:WBAT Follow-up:in 2 weeks Disposition - Home Discharged Condition: stable   Discharge Instructions    Call MD / Call 911    Complete by:  As directed   If you experience chest pain or shortness of breath, CALL 911 and be transported to the hospital emergency room.  If you develope a fever above 101 F, pus (white drainage) or increased drainage or redness at the wound, or calf pain, call your surgeon's office.     Constipation Prevention    Complete by:  As directed   Drink plenty of fluids.  Prune juice may be helpful.  You may use a stool softener, such as Colace (over the counter) 100 mg twice a day.  Use MiraLax (over the counter) for constipation as needed.     Diet Carb Modified    Complete by:  As directed      Increase activity slowly as tolerated    Complete by:  As directed             Medication List    STOP taking these medications        acetaminophen 500 MG tablet  Commonly known as:  TYLENOL     ALIGN PO     Melatonin 5 MG Caps      TAKE these medications        albuterol 108 (90 BASE) MCG/ACT inhaler  Commonly known as:  VENTOLIN HFA  Inhale 2 puffs into the lungs every 6 (six) hours as needed. For shortness of breath     albuterol 1.25 MG/3ML nebulizer solution  Commonly known as:  ACCUNEB  Take 3 mLs (1.25 mg total) by nebulization every 6 (six) hours as needed for wheezing.     gabapentin 600 MG  tablet  Commonly known as:  NEURONTIN  Take 1 tablet (600 mg total) by mouth 4 (four) times daily.     metFORMIN 500 MG tablet  Commonly known as:  GLUCOPHAGE  Take 1 tablet (500 mg total) by mouth daily with breakfast.     methocarbamol 500 MG tablet  Commonly known as:  ROBAXIN  Take 1 tablet (500 mg total) by mouth every 6 (six) hours as needed for muscle spasms.     omeprazole 20 MG capsule  Commonly known as:  PRILOSEC  Take 1 capsule (20 mg total) by mouth daily.     ondansetron 4 MG tablet  Commonly known as:  ZOFRAN  Take 1  tablet (4 mg total) by mouth every 6 (six) hours as needed for nausea.     oxyCODONE 5 MG immediate release tablet  Commonly known as:  Oxy IR/ROXICODONE  Take 1-2 tablets (5-10 mg total) by mouth every 3 (three) hours as needed for breakthrough pain.     rivaroxaban 10 MG Tabs tablet  Commonly known as:  XARELTO  Take 1 tablet (10 mg total) by mouth daily with breakfast.     SYSTANE OVERNIGHT THERAPY 0.3 % Gel ophthalmic ointment  Generic drug:  hypromellose  Place 1 drop into both eyes at bedtime as needed.     traMADol 50 MG tablet  Commonly known as:  ULTRAM  Take 1-2 tablets (50-100 mg total) by mouth every 6 (six) hours as needed for moderate pain.           Follow-up Information    Follow up with Gearlean Alf, MD. Schedule an appointment as soon as possible for a visit on 02/06/2015.   Specialty:  Orthopedic Surgery   Why:  Call [phone] tomorrow to make the appointment   Contact information:   [address] [place] [address] [phone]       Signed: Ardeen Jourdain, PA-C Orthopaedic Surgery 01/23/2015, 12:29 PM

## 2015-01-24 NOTE — Care Management Note (Signed)
Case Management Note  Patient Details  Name: [name] MRN: [id number] Date of Birth: 09-03-1955  Subjective/Objective:                   PROCEDURE:Left knee medial unicompartmental arthroplasty.  Action/Plan:  Discharge planning Expected Discharge Date:  01/24/15               Expected Discharge Plan:  [place]  In-House Referral:  NA  Discharge planning Services  CM Consult  Post Acute Care Choice:  NA Choice offered to:  NA  DME Arranged:  N/A DME Agency:  NA  HH Arranged:  NA, PT HH Agency:  [place]  Status of Service:  Completed, signed off  Medicare Important Message Given:    Date Medicare IM Given:    Medicare IM give by:    Date Additional Medicare IM Given:    Additional Medicare Important Message give by:     If discussed at [place] of Stay Meetings, dates discussed:    Additional Comments: CM received notice from charge RN pt has not been seen by [name] yet.  CM has placed a call to [facility] rep, [name] to have immediate [name] for HHPT.  No other CM needs were communicated. [name], RN 01/24/2015, 9:47 AM

## 2015-02-09 ENCOUNTER — Telehealth: Payer: Self-pay | Admitting: *Deleted

## 2015-02-09 DIAGNOSIS — R001 Bradycardia, unspecified: Secondary | ICD-10-CM

## 2015-02-09 NOTE — Telephone Encounter (Signed)
Patient complains that blood pressure and pulse have been running low since her knee surgery 3 weeks ago.  A nurse from her insurance company was at her home yesterday and her blood pressure was 100/70 with a pulse of 50. States today that her pulse if 45 and that she feels week. She is drinking plenty of water and doesn't feel dehydrated.  What do you suggest.?

## 2015-02-09 NOTE — Telephone Encounter (Signed)
Referral done for cardiology and patient aware.

## 2015-02-09 NOTE — Telephone Encounter (Signed)
Please send to cardiology

## 2015-02-13 ENCOUNTER — Ambulatory Visit (INDEPENDENT_AMBULATORY_CARE_PROVIDER_SITE_OTHER): Payer: Medicare Other | Admitting: *Deleted

## 2015-02-13 DIAGNOSIS — D519 Vitamin B12 deficiency anemia, unspecified: Secondary | ICD-10-CM | POA: Diagnosis not present

## 2015-02-13 MED ORDER — CYANOCOBALAMIN 1000 MCG/ML IJ SOLN
1000.0000 ug | INTRAMUSCULAR | Status: AC
  Administered 2015-02-13 – 2015-07-20 (×6): 1000 ug via INTRAMUSCULAR

## 2015-02-13 NOTE — Patient Instructions (Signed)

## 2015-02-13 NOTE — Progress Notes (Signed)
Pt given B12 injection IM left deltoid and tolerated well. °

## 2015-03-19 ENCOUNTER — Ambulatory Visit (INDEPENDENT_AMBULATORY_CARE_PROVIDER_SITE_OTHER): Payer: Medicare Other | Admitting: *Deleted

## 2015-03-19 DIAGNOSIS — D519 Vitamin B12 deficiency anemia, unspecified: Secondary | ICD-10-CM | POA: Diagnosis not present

## 2015-03-19 NOTE — Progress Notes (Signed)
Pt tolerated inj well

## 2015-03-21 ENCOUNTER — Other Ambulatory Visit: Payer: Self-pay | Admitting: Nurse Practitioner

## 2015-03-21 DIAGNOSIS — E119 Type 2 diabetes mellitus without complications: Secondary | ICD-10-CM

## 2015-03-21 MED ORDER — OMEPRAZOLE 20 MG PO CPDR: 20.0000 mg | DELAYED_RELEASE_CAPSULE | Freq: Every day | ORAL | Status: DC

## 2015-03-21 MED ORDER — METFORMIN HCL 500 MG PO TABS: 500.0000 mg | ORAL_TABLET | Freq: Every day | ORAL | Status: DC

## 2015-03-21 MED ORDER — GABAPENTIN 600 MG PO TABS: 600.0000 mg | ORAL_TABLET | Freq: Four times a day (QID) | ORAL | Status: DC

## 2015-03-21 NOTE — Telephone Encounter (Signed)
Rx sent to [facility]  

## 2015-03-28 ENCOUNTER — Encounter: Payer: Self-pay | Admitting: Family Medicine

## 2015-03-28 ENCOUNTER — Ambulatory Visit (INDEPENDENT_AMBULATORY_CARE_PROVIDER_SITE_OTHER): Payer: Medicare Other

## 2015-03-28 ENCOUNTER — Ambulatory Visit (INDEPENDENT_AMBULATORY_CARE_PROVIDER_SITE_OTHER): Payer: Medicare Other | Admitting: Family Medicine

## 2015-03-28 VITALS — BP 107/61 | HR 88 | Temp 97.6°F

## 2015-03-28 DIAGNOSIS — J441 Chronic obstructive pulmonary disease with (acute) exacerbation: Secondary | ICD-10-CM | POA: Diagnosis not present

## 2015-03-28 DIAGNOSIS — Z720 Tobacco use: Secondary | ICD-10-CM

## 2015-03-28 MED ORDER — LEVALBUTEROL HCL 0.63 MG/3ML IN NEBU: 0.6300 mg | INHALATION_SOLUTION | Freq: Once | RESPIRATORY_TRACT | Status: DC

## 2015-03-28 MED ORDER — BETAMETHASONE SOD PHOS & ACET 6 (3-3) MG/ML IJ SUSP
6.0000 mg | Freq: Once | INTRAMUSCULAR | Status: AC
  Administered 2015-03-28: 6 mg via INTRAMUSCULAR

## 2015-03-28 MED ORDER — PREDNISONE 10 MG PO TABS: ORAL_TABLET | ORAL | Status: DC

## 2015-03-28 MED ORDER — FLUTICASONE PROPIONATE HFA 110 MCG/ACT IN AERO: 2.0000 | INHALATION_SPRAY | Freq: Two times a day (BID) | RESPIRATORY_TRACT | Status: DC

## 2015-03-28 NOTE — Progress Notes (Signed)
Subjective:  Patient ID: [name], female    DOB: [birth year]  Age: 59 y.o. MRN: [id number]  CC: COPD   HPI ANIYLA HARLING presents for Dyspneic for several days. "Big attack today." Used neb 4 hours ago with some relief. Inhaler had been effective. C meds below. MUscles feel like jello. Feels weak all over. She has been told she has COPD off and on for years but no one has actually established the diagnosis apparently. She denies cough. There has been no upper respiratory congestion. No earaches no runny nose no sore throat. No fever but she has felt hot. However, she relates that back to the temperature outside.  History Eleasha has a past medical history of Multiple sclerosis; Type 2 diabetes mellitus; GERD (gastroesophageal reflux disease); COPD (chronic obstructive pulmonary disease); Mixed hyperlipidemia; PONV (postoperative nausea and vomiting); Weakness of left side of body; Arthritis; Psoriasis; Neuropathy; Neuromuscular disorder; History of skin cancer; Anxiety; and Cancer.   She has past surgical history that includes Right total knee replacement (2010); Left breast lumpectomy; Left carpal tunnel release; Abdominal hysterectomy; Sinus cyst removal; Cholecystectomy; Wrist surgery; Shoulder surgery; Knee arthroscopy; Total knee revision (05/05/2012); and Partial knee arthroplasty (Left, 01/22/2015).   Her family history includes Atrial fibrillation in her father; Dementia in her mother.She reports that she has been smoking Cigarettes.  She has a 20 pack-year smoking history. She has never used smokeless tobacco. She reports that she does not drink alcohol or use illicit drugs.  Outpatient Prescriptions Prior to Visit  Medication Sig Dispense Refill  . albuterol (ACCUNEB) 1.25 MG/3ML nebulizer solution Take 3 mLs (1.25 mg total) by nebulization every 6 (six) hours as needed for wheezing. 75 mL 12  . albuterol (VENTOLIN HFA) 108 (90 BASE) MCG/ACT inhaler Inhale 2 puffs into the lungs  every 6 (six) hours as needed. For shortness of breath 1 Inhaler 1  . gabapentin (NEURONTIN) 600 MG tablet Take 1 tablet (600 mg total) by mouth 4 (four) times daily. 360 tablet 0  . Hypromellose (SYSTANE OVERNIGHT THERAPY) 0.3 % GEL Place 1 drop into both eyes at bedtime as needed.    . metFORMIN (GLUCOPHAGE) 500 MG tablet Take 1 tablet (500 mg total) by mouth daily with breakfast. 90 tablet 0  . omeprazole (PRILOSEC) 20 MG capsule Take 1 capsule (20 mg total) by mouth daily. 90 capsule 0  . methocarbamol (ROBAXIN) 500 MG tablet Take 1 tablet (500 mg total) by mouth every 6 (six) hours as needed for muscle spasms. (Patient not taking: Reported on 03/28/2015) 40 tablet 1  . ondansetron (ZOFRAN) 4 MG tablet Take 1 tablet (4 mg total) by mouth every 6 (six) hours as needed for nausea. (Patient not taking: Reported on 03/28/2015) 30 tablet 0  . oxyCODONE (OXY IR/ROXICODONE) 5 MG immediate release tablet Take 1-2 tablets (5-10 mg total) by mouth every 3 (three) hours as needed for breakthrough pain. (Patient not taking: Reported on 03/28/2015) 60 tablet 0  . rivaroxaban (XARELTO) 10 MG TABS tablet Take 1 tablet (10 mg total) by mouth daily with breakfast. (Patient not taking: Reported on 03/28/2015) 10 tablet 0  . traMADol (ULTRAM) 50 MG tablet Take 1-2 tablets (50-100 mg total) by mouth every 6 (six) hours as needed for moderate pain. (Patient not taking: Reported on 03/28/2015) 60 tablet 0   Facility-Administered Medications Prior to Visit  Medication Dose Route Frequency Provider Last Rate Last Dose  . cyanocobalamin ((VITAMIN B-12)) injection 1,000 mcg  1,000 mcg Intramuscular Q30 days  Mary-Margaret Hassell Done, FNP   1,000 mcg at 03/19/15 1101    ROS Review of Systems  Constitutional: Positive for activity change and fatigue. Negative for fever, chills, diaphoresis, appetite change and unexpected weight change.  HENT: Negative for congestion, ear pain, hearing loss, postnasal drip, rhinorrhea, sneezing,  sore throat and trouble swallowing.   Eyes: Negative for pain.  Respiratory: Positive for chest tightness, shortness of breath and wheezing. Negative for cough.   Cardiovascular: Negative for chest pain and palpitations.  Gastrointestinal: Negative for nausea, vomiting, abdominal pain, diarrhea and constipation.  Musculoskeletal: Positive for joint swelling (recent left knee replacement) and arthralgias.  Skin: Negative for rash.  Neurological: Negative for dizziness, weakness, numbness and headaches.  Psychiatric/Behavioral: Negative for dysphoric mood and agitation.    Objective:  BP 107/61 mmHg  Pulse 88  Temp(Src) 97.6 F (36.4 C) (Oral)  SpO2 98%  BP Readings from Last 3 Encounters:  03/28/15 107/61  01/23/15 117/47  01/12/15 117/56    Wt Readings from Last 3 Encounters:  01/22/15 225 lb (102.059 kg)  01/12/15 225 lb (102.059 kg)  01/10/15 224 lb (101.606 kg)     Physical Exam  Constitutional: She appears well-developed and well-nourished.  HENT:  Head: Normocephalic and atraumatic.  Right Ear: Tympanic membrane and external ear normal. No decreased hearing is noted.  Left Ear: Tympanic membrane and external ear normal. No decreased hearing is noted.  Nose: Mucosal edema present. Right sinus exhibits no frontal sinus tenderness. Left sinus exhibits no frontal sinus tenderness.  Mouth/Throat: No oropharyngeal exudate or posterior oropharyngeal erythema.  Eyes: Conjunctivae are normal. Pupils are equal, round, and reactive to light.  Neck: Neck supple. No Brudzinski's sign noted. No thyromegaly present.  Cardiovascular: Normal rate, regular rhythm and normal heart sounds.   No murmur heard. Pulmonary/Chest: Effort normal. No respiratory distress. She has wheezes. She has no rales. She exhibits no tenderness.  Lymphadenopathy:       Head (right side): No preauricular adenopathy present.       Head (left side): No preauricular adenopathy present.       Right cervical:  No superficial cervical adenopathy present.      Left cervical: No superficial cervical adenopathy present.    Lab Results  Component Value Date   HGBA1C 6.0 01/10/2015   HGBA1C 5.9 10/02/2014   HGBA1C 5.9 06/26/2014    Lab Results  Component Value Date   WBC 14.1* 01/23/2015   HGB 12.4 01/23/2015   HCT 39.0 01/23/2015   PLT 240 01/23/2015   GLUCOSE 160* 01/23/2015   CHOL 244* 01/10/2015   TRIG 121 01/10/2015   HDL 43 01/10/2015   LDLCALC 148* 03/20/2014   ALT 16 01/10/2015   AST 17 01/10/2015   NA 140 01/23/2015   K 4.4 01/23/2015   CL 106 01/23/2015   CREATININE 0.69 01/23/2015   BUN 10 01/23/2015   CO2 27 01/23/2015   TSH 3.290 02/21/2014   INR 1.11 01/12/2015   HGBA1C 6.0 01/10/2015    No results found.  Assessment & Plan:   Zanya was seen today for copd.  Diagnoses and all orders for this visit:  COPD exacerbation -     DG Chest 2 View; Future -     betamethasone acetate-betamethasone sodium phosphate (CELESTONE) injection 6 mg; Inject 1 mL (6 mg total) into the muscle once. -     levalbuterol (XOPENEX) nebulizer solution 0.63 mg; Take 3 mLs (0.63 mg total) by nebulization once.  Tobacco abuse  Other orders -  predniSONE (DELTASONE) 10 MG tablet; Take 5 daily for 3 days followed by 4,3,2 and 1 for 3 days each. -     fluticasone (FLOVENT HFA) 110 MCG/ACT inhaler; Inhale 2 puffs into the lungs 2 (two) times daily.   I have discontinued Ms. Pettingill's oxyCODONE, traMADol, rivaroxaban, ondansetron, and methocarbamol. I am also having her start on predniSONE and fluticasone. Additionally, I am having her maintain her hypromellose, albuterol, albuterol, omeprazole, metFORMIN, and gabapentin. We administered betamethasone acetate-betamethasone sodium phosphate. We will continue to administer cyanocobalamin and levalbuterol.  Meds ordered this encounter  Medications  . predniSONE (DELTASONE) 10 MG tablet    Sig: Take 5 daily for 3 days followed by 4,3,2 and  1 for 3 days each.    Dispense:  45 tablet    Refill:  0  . fluticasone (FLOVENT HFA) 110 MCG/ACT inhaler    Sig: Inhale 2 puffs into the lungs 2 (two) times daily.    Dispense:  1 Inhaler    Refill:  12  . betamethasone acetate-betamethasone sodium phosphate (CELESTONE) injection 6 mg    Sig:   . levalbuterol (XOPENEX) nebulizer solution 0.63 mg    Sig:      Follow-up: Return if symptoms worsen or fail to improve.  Claretta Fraise, M.D.

## 2015-03-30 MED ORDER — LEVALBUTEROL HCL 1.25 MG/3ML IN NEBU
1.2500 mg | INHALATION_SOLUTION | Freq: Once | RESPIRATORY_TRACT | Status: AC
  Administered 2015-03-28: 1.25 mg via RESPIRATORY_TRACT

## 2015-03-30 NOTE — Addendum Note (Signed)
Addended by: Marin Olp on: 03/30/2015 05:34 PM   Modules accepted: Orders, SmartSet

## 2015-04-20 ENCOUNTER — Ambulatory Visit (INDEPENDENT_AMBULATORY_CARE_PROVIDER_SITE_OTHER): Payer: Medicare Other | Admitting: *Deleted

## 2015-04-20 DIAGNOSIS — D519 Vitamin B12 deficiency anemia, unspecified: Secondary | ICD-10-CM

## 2015-04-20 NOTE — Progress Notes (Signed)
Pt tolerated inj well

## 2015-04-30 ENCOUNTER — Ambulatory Visit (INDEPENDENT_AMBULATORY_CARE_PROVIDER_SITE_OTHER): Payer: Medicare Other | Admitting: Nurse Practitioner

## 2015-04-30 ENCOUNTER — Encounter: Payer: Self-pay | Admitting: Nurse Practitioner

## 2015-04-30 VITALS — BP 111/62 | HR 74 | Temp 97.8°F | Ht 70.0 in | Wt 224.0 lb

## 2015-04-30 DIAGNOSIS — K219 Gastro-esophageal reflux disease without esophagitis: Secondary | ICD-10-CM | POA: Diagnosis not present

## 2015-04-30 DIAGNOSIS — D519 Vitamin B12 deficiency anemia, unspecified: Secondary | ICD-10-CM

## 2015-04-30 DIAGNOSIS — E1142 Type 2 diabetes mellitus with diabetic polyneuropathy: Secondary | ICD-10-CM | POA: Diagnosis not present

## 2015-04-30 DIAGNOSIS — E782 Mixed hyperlipidemia: Secondary | ICD-10-CM | POA: Diagnosis not present

## 2015-04-30 DIAGNOSIS — Z6831 Body mass index (BMI) 31.0-31.9, adult: Secondary | ICD-10-CM | POA: Diagnosis not present

## 2015-04-30 DIAGNOSIS — J449 Chronic obstructive pulmonary disease, unspecified: Secondary | ICD-10-CM

## 2015-04-30 LAB — POCT GLYCOSYLATED HEMOGLOBIN (HGB A1C): Hemoglobin A1C: 6.3

## 2015-04-30 NOTE — Progress Notes (Signed)
Subjective:    Patient ID: [name], female    DOB: [birth year], 57 y.o.   MRN: [id number]  Patient here today for follow up of chronic medical problems. No acute problem.  Hyperlipidemia This is a chronic problem. The current episode started more than 1 year ago. Recent lipid tests were reviewed and are high. Exacerbating diseases include obesity. Pertinent negatives include no chest pain, myalgias or shortness of breath. Current antihyperlipidemic treatment includes statins. The current treatment provides moderate improvement of lipids. Compliance problems include adherence to diet and adherence to exercise.  Risk factors for coronary artery disease include diabetes mellitus.  Diabetes She presents for her follow-up diabetic visit. She has type 2 diabetes mellitus. There are no hypoglycemic associated symptoms. Pertinent negatives for diabetes include no chest pain, no polydipsia, no polyphagia, no polyuria, no visual change and no weakness. There are no hypoglycemic complications. There are no diabetic complications. Risk factors for coronary artery disease include diabetes mellitus, dyslipidemia, hypertension and post-menopausal. She is compliant with treatment all of the time. Her weight is stable. She is following a generally healthy diet. When asked about meal planning, she reported none. She has not had a previous visit with a dietitian. She rarely participates in exercise. There is no change in her home blood glucose trend. Her breakfast blood glucose is taken between 9-10 am. Her breakfast blood glucose range is generally 130-140 mg/dl. An ACE inhibitor/angiotensin II receptor blocker is not being taken. She does not see a podiatrist.Eye exam is current (July 16).  MS States she only goes to see Dr. Bjorn Loser if needed, and has not been in a couple of years  GERD omerprazole daily- keeps symptom sunder control  Biateral foot neuropathy Gabapentin helps with bruning senastion- no c/ side  effects  COPD No maintenance inhaler- only use albuterol as needed- only used twice this past mont   B 12 def Has been getting injections weekly for the  Last month- once a month B12 injection.     Review of Systems  Respiratory: Negative for shortness of breath.   Cardiovascular: Negative for chest pain, palpitations and leg swelling.  Endocrine: Negative for polydipsia, polyphagia and polyuria.  Musculoskeletal: Negative for myalgias.  Neurological: Negative for weakness.  All other systems reviewed and are negative.      Objective:   Physical Exam  Constitutional: She is oriented to person, place, and time. She appears well-developed and well-nourished.  HENT:  Head: Normocephalic and atraumatic.  Mouth/Throat: Oropharynx is clear and moist.  Eyes: Pupils are equal, round, and reactive to light.  Neck: Normal range of motion.  Cardiovascular: Normal rate and regular rhythm.  Exam reveals no gallop and no friction rub.   No murmur heard. Pulmonary/Chest: Effort normal and breath sounds normal. No respiratory distress. She has no wheezes. She has no rales. She exhibits no tenderness.  Abdominal: Soft.  Neurological: She is alert and oriented to person, place, and time. She has normal reflexes.  Skin: Skin is warm.  Psychiatric: She has a normal mood and affect. Her behavior is normal. Judgment and thought content normal.      BP 111/62 mmHg  Pulse 74  Temp(Src) 97.8 F (36.6 C) (Oral)  Ht _0  (1.778 m)  Wt 224 lb (101.606 kg)  BMI 32.14 kg/m2  Results for orders placed or performed in visit on 04/30/15  POCT glycosylated hemoglobin (Hb A1C)  Result Value Ref Range   Hemoglobin A1C 6.3     Assessment &  Plan:   1. Type 2 diabetes mellitus with diabetic polyneuropathy Continue with medications, and diet managment - POCT glycosylated hemoglobin (Hb A1C) - CMP14+EGFR  2. Mixed hyperlipidemia Low fat diet, and exercise - Lipid panel  3. COPD with chronic  bronchitis Continue with inhaler as needed, follow up sooner if needed or change in ADLs related to breathing   4. Gastroesophageal reflux disease without esophagitis Avoid spicy foods, and eating 2 hours prior to bedtime  5. Diabetic polyneuropathy associated with type 2 diabetes mellitus Check bilateral feet on daily basis for decreased sensation or skin break down.  6. B12 deficiency anemia   7. BMI 31.0-31.9,adult Diet and exercise counseling.    Continue all meds Labs pending Health Maintenance reviewed Diet and exercise encouraged RTO 3 months  Michelle Hassell Done, FNP     Labs pending Health maintenance reviewed Diet and exercise encouraged Continue all meds Follow up  In 3 month   [place], FNP

## 2015-04-30 NOTE — Patient Instructions (Signed)
Bone Health Our bones do many things. They provide structure, protect organs, anchor muscles, and store calcium. Adequate calcium in your diet and weight-bearing physical activity help build strong bones, improve bone amounts, and may reduce the risk of weakening of bones (osteoporosis) later in life. PEAK BONE MASS By age 57, the average woman has acquired most of her skeletal bone mass. A large decline occurs in older adults which increases the risk of osteoporosis. In women this occurs around the time of menopause. It is important for young girls to reach their peak bone mass in order to maintain bone health throughout life. A person with high bone mass as a young adult will be more likely to have a higher bone mass later in life. Not enough calcium consumption and physical activity early on could result in a failure to achieve optimum bone mass in adulthood. OSTEOPOROSIS Osteoporosis is a disease of the bones. It is defined as low bone mass with deterioration of bone structure. Osteoporosis leads to an increase risk of fractures with falls. These fractures commonly happen in the wrist, hip, and spine. While men and women of all ages and background can develop osteoporosis, some of the risk factors for osteoporosis are:  Female.  White.  Postmenopausal.  Older adults.  Small in body size.  Eating a diet low in calcium.  Physically inactive.  Smoking.  Use of some medications.  Family history. CALCIUM Calcium is a mineral needed by the body for healthy bones, teeth, and proper function of the heart, muscles, and nerves. The body cannot produce calcium so it must be absorbed through food. Good sources of calcium include:  Dairy products (low fat or nonfat milk, cheese, and yogurt).  Dark green leafy vegetables (bok choy and broccoli).  Calcium fortified foods (orange juice, cereal, bread, soy beverages, and tofu products).  Nuts (almonds). Recommended amounts of calcium vary  for individuals. RECOMMENDED CALCIUM INTAKES Age and Amount in mg per day  Children 1 to 3 years / 700 mg  Children 4 to 8 years / 1,000 mg  Children 9 to 13 years / 1,300 mg  Teens 14 to 18 years / 1,300 mg  Adults 19 to 50 years / 1,000 mg  Adult women 51 to 70 years / 1,200 mg  Adults 71 years and older / 1,200 mg  Pregnant and breastfeeding teens / 1,300 mg  Pregnant and breastfeeding adults / 1,000 mg Vitamin D also plays an important role in healthy bone development. Vitamin D helps in the absorption of calcium. WEIGHT-BEARING PHYSICAL ACTIVITY Regular physical activity has many positive health benefits. Benefits include strong bones. Weight-bearing physical activity early in life is important in reaching peak bone mass. Weight-bearing physical activities cause muscles and bones to work against gravity. Some examples of weight bearing physical activities include:  Walking, jogging, or running.  Field Hockey.  Jumping rope.  Dancing.  Soccer.  Tennis or Racquetball.  Stair climbing.  Basketball.  Hiking.  Weight lifting.  Aerobic fitness classes. Including weight-bearing physical activity into an exercise plan is a great way to keep bones healthy. Adults: Engage in at least 30 minutes of moderate physical activity on most, preferably all, days of the week. Children: Engage in at least 60 minutes of moderate physical activity on most, preferably all, days of the week. FOR MORE INFORMATION [place] Department of Agriculture, Center for Nutrition Policy and Promotion: www.cnpp.usda.gov National Osteoporosis Foundation: www.nof.org Document Released: 10/18/2003 Document Revised: 11/22/2012 Document Reviewed: 01/17/2009 Exit[employer] Patient Information   2015 [employer]. This information is not intended to replace advice given to you by your health care provider. Make sure you discuss any questions you have with your health care provider.  

## 2015-05-01 LAB — LIPID PANEL
Chol/HDL Ratio: 5.3 ratio units — ABNORMAL HIGH (ref 0.0–4.4)
Cholesterol, Total: 223 mg/dL — ABNORMAL HIGH (ref 100–199)
HDL: 42 mg/dL (ref >39)
LDL CALC: 150 mg/dL — AB (ref 0–99)
Triglycerides: 157 mg/dL — ABNORMAL HIGH (ref 0–149)
VLDL CHOLESTEROL CAL: 31 mg/dL (ref 5–40)

## 2015-05-01 LAB — CMP14+EGFR
ALBUMIN: 3.8 g/dL (ref 3.5–5.5)
ALT: 8 IU/L (ref 0–32)
AST: 9 IU/L (ref 0–40)
Albumin/Globulin Ratio: 1.7 (ref 1.1–2.5)
Alkaline Phosphatase: 79 IU/L (ref 39–117)
BUN / CREAT RATIO: 11 (ref 9–23)
BUN: 9 mg/dL (ref 6–24)
Bilirubin Total: 0.4 mg/dL (ref 0.0–1.2)
CO2: 27 mmol/L (ref 18–29)
CREATININE: 0.8 mg/dL (ref 0.57–1.00)
Calcium: 9.2 mg/dL (ref 8.7–10.2)
Chloride: 100 mmol/L (ref 97–108)
GFR calc non Af Amer: 83 mL/min/{1.73_m2} (ref >59)
GFR, EST AFRICAN AMERICAN: 95 mL/min/{1.73_m2} (ref >59)
GLUCOSE: 108 mg/dL — AB (ref 65–99)
Globulin, Total: 2.3 g/dL (ref 1.5–4.5)
Potassium: 4.6 mmol/L (ref 3.5–5.2)
Sodium: 142 mmol/L (ref 134–144)
Total Protein: 6.1 g/dL (ref 6.0–8.5)

## 2015-05-03 ENCOUNTER — Other Ambulatory Visit: Payer: Self-pay | Admitting: Nurse Practitioner

## 2015-05-03 MED ORDER — COLESEVELAM HCL 625 MG PO TABS: 1875.0000 mg | ORAL_TABLET | Freq: Two times a day (BID) | ORAL | Status: DC

## 2015-05-21 ENCOUNTER — Ambulatory Visit (INDEPENDENT_AMBULATORY_CARE_PROVIDER_SITE_OTHER): Payer: Medicare Other | Admitting: *Deleted

## 2015-05-21 DIAGNOSIS — D519 Vitamin B12 deficiency anemia, unspecified: Secondary | ICD-10-CM

## 2015-05-21 NOTE — Patient Instructions (Signed)

## 2015-05-21 NOTE — Progress Notes (Signed)
Vitamin b12 injection given and tolerated well.  

## 2015-06-08 ENCOUNTER — Encounter: Payer: Self-pay | Admitting: Family Medicine

## 2015-06-08 ENCOUNTER — Ambulatory Visit (INDEPENDENT_AMBULATORY_CARE_PROVIDER_SITE_OTHER): Payer: Medicare Other | Admitting: Family Medicine

## 2015-06-08 ENCOUNTER — Ambulatory Visit (HOSPITAL_COMMUNITY)
Admission: RE | Admit: 2015-06-08 | Discharge: 2015-06-08 | Disposition: A | Discharge status: Home / Self Care | Payer: Medicare Other | Source: Ambulatory Visit | Attending: Family Medicine | Admitting: Family Medicine

## 2015-06-08 VITALS — BP 125/63 | HR 87 | Temp 98.2°F | Ht 70.0 in | Wt 226.2 lb

## 2015-06-08 DIAGNOSIS — Z90710 Acquired absence of both cervix and uterus: Secondary | ICD-10-CM | POA: Insufficient documentation

## 2015-06-08 DIAGNOSIS — R140 Abdominal distension (gaseous): Secondary | ICD-10-CM | POA: Insufficient documentation

## 2015-06-08 DIAGNOSIS — Z9049 Acquired absence of other specified parts of digestive tract: Secondary | ICD-10-CM | POA: Diagnosis not present

## 2015-06-08 DIAGNOSIS — R1033 Periumbilical pain: Secondary | ICD-10-CM

## 2015-06-08 DIAGNOSIS — K5900 Constipation, unspecified: Secondary | ICD-10-CM | POA: Insufficient documentation

## 2015-06-08 LAB — POCT URINALYSIS DIPSTICK
Bilirubin, UA: NEGATIVE
GLUCOSE UA: NEGATIVE
Ketones, UA: NEGATIVE
NITRITE UA: NEGATIVE
Protein, UA: NEGATIVE
Spec Grav, UA: >=1.03
UROBILINOGEN UA: NEGATIVE
pH, UA: 5

## 2015-06-08 LAB — POCT UA - MICROSCOPIC ONLY
BACTERIA, U MICROSCOPIC: NEGATIVE
Casts, Ur, LPF, POC: NEGATIVE
Crystals, Ur, HPF, POC: NEGATIVE
Yeast, UA: NEGATIVE

## 2015-06-08 MED ORDER — IOHEXOL 300 MG/ML  SOLN
100.0000 mL | Freq: Once | INTRAMUSCULAR | Status: AC | PRN
  Administered 2015-06-08: 100 mL via INTRAVENOUS

## 2015-06-08 MED ORDER — IOHEXOL 300 MG/ML  SOLN
50.0000 mL | Freq: Once | INTRAMUSCULAR | Status: AC | PRN
  Administered 2015-06-08: 50 mL via ORAL

## 2015-06-08 NOTE — Progress Notes (Signed)
BP 125/63 mmHg  Pulse 87  Temp(Src) 98.2 F (36.8 C) (Oral)  Ht 5' 10"  (1.778 m)  Wt 226 lb 3.2 oz (102.604 kg)  BMI 32.46 kg/m2   Subjective:    Patient ID: [name], female    DOB: 07-26-1956, 59 y.o.   MRN: [id number]  HPI: [name] is a 59 y.o. female presenting on 06/08/2015 for Abdominal Pain and Constipation & bloating   HPI Abdominal pain Patient complains of constipation central abdominal pain and bloating that started 1 month ago. Around the same time she started [facility] and she thinks that may have been what triggered this. She has not had previously similar to this. She has abdominal pain radiating to her right flank as well. She has generalized uncomfortableness or inability to sit still because the pain. She denies fevers or chills. She had constipation initially but now has developed some looser stools. She denies blood in her stool. She denies any blood in urine or burning when she needs.  Relevant past medical, surgical, family and social history reviewed and updated as indicated. Interim medical history since our last visit reviewed. Allergies and medications reviewed and updated.  Review of Systems  Constitutional: Negative for fever and chills.  HENT: Negative for congestion, ear discharge and ear pain.   Eyes: Negative for redness and visual disturbance.  Respiratory: Negative for chest tightness and shortness of breath.   Cardiovascular: Negative for chest pain and leg swelling.  Gastrointestinal: Positive for nausea, abdominal pain and diarrhea. Negative for vomiting, constipation, blood in stool and anal bleeding.  Genitourinary: Negative for dysuria and difficulty urinating.  Musculoskeletal: Negative for back pain and gait problem.  Skin: Negative for rash.  Neurological: Negative for light-headedness and headaches.  Psychiatric/Behavioral: Negative for behavioral problems and agitation.  All other systems reviewed and are  negative.   Per HPI unless specifically indicated above     Medication List       This list is accurate as of: 06/08/15  3:56 PM.  Always use your most recent med list.               albuterol 108 (90 BASE) MCG/ACT inhaler  Commonly known as:  VENTOLIN HFA  Inhale 2 puffs into the lungs every 6 (six) hours as needed. For shortness of breath     albuterol 1.25 MG/3ML nebulizer solution  Commonly known as:  ACCUNEB  Take 3 mLs (1.25 mg total) by nebulization every 6 (six) hours as needed for wheezing.     colesevelam 625 MG tablet  Commonly known as:  WELCHOL  Take 3 tablets (1,875 mg total) by mouth 2 (two) times daily with a meal.     diclofenac sodium 1 % Gel  Commonly known as:  VOLTAREN  Apply topically 4 (four) times daily.     furosemide 20 MG tablet  Commonly known as:  LASIX  Take 20 mg by mouth. As needed     gabapentin 600 MG tablet  Commonly known as:  NEURONTIN  Take 1 tablet (600 mg total) by mouth 4 (four) times daily.     metFORMIN 500 MG tablet  Commonly known as:  GLUCOPHAGE  Take 1 tablet (500 mg total) by mouth daily with breakfast.     omeprazole 20 MG capsule  Commonly known as:  PRILOSEC  Take 1 capsule (20 mg total) by mouth daily.     RESTASIS 0.05 % ophthalmic emulsion  Generic drug:  cycloSPORINE  SYSTANE OVERNIGHT THERAPY 0.3 % Gel ophthalmic ointment  Generic drug:  hypromellose  Place 1 drop into both eyes at bedtime as needed.           Objective:    BP 125/63 mmHg  Pulse 87  Temp(Src) 98.2 F (36.8 C) (Oral)  Ht 5' 10"  (1.778 m)  Wt 226 lb 3.2 oz (102.604 kg)  BMI 32.46 kg/m2  Wt Readings from Last 3 Encounters:  06/08/15 226 lb 3.2 oz (102.604 kg)  04/30/15 224 lb (101.606 kg)  01/22/15 225 lb (102.059 kg)    Physical Exam  Constitutional: She is oriented to person, place, and time. She appears well-developed and well-nourished. No distress.  Eyes: Conjunctivae and EOM are normal. Pupils are equal, round, and  reactive to light.  Neck: Neck supple. No thyromegaly present.  Cardiovascular: Normal rate, regular rhythm, normal heart sounds and intact distal pulses.   No murmur heard. Pulmonary/Chest: Effort normal and breath sounds normal. No respiratory distress. She has no wheezes.  Abdominal: Soft. Bowel sounds are normal. She exhibits no distension and no mass. There is no hepatosplenomegaly. There is tenderness in the periumbilical area. There is CVA tenderness (right sided). There is no rigidity, no rebound and no guarding.  Musculoskeletal: Normal range of motion. She exhibits no edema or tenderness.  Lymphadenopathy:    She has no cervical adenopathy.  Neurological: She is alert and oriented to person, place, and time. Coordination normal.  Skin: Skin is warm and dry. No rash noted. She is not diaphoretic.  Psychiatric: She has a normal mood and affect. Her behavior is normal.  Vitals reviewed.   Results for orders placed or performed in visit on 06/08/15  POCT urinalysis dipstick  Result Value Ref Range   Color, UA YELLOW    Clarity, UA CLEAR    Glucose, UA NEG    Bilirubin, UA NEG    Ketones, UA NEG    Spec Grav, UA >=1.030    Blood, UA TRACE    pH, UA 5.0    Protein, UA NEG    Urobilinogen, UA negative    Nitrite, UA NEG    Leukocytes, UA Trace (A) Negative  POCT UA - Microscopic Only  Result Value Ref Range   WBC, Ur, HPF, POC 1-5    RBC, urine, microscopic 5-7    Bacteria, U Microscopic NEG    Mucus, UA OCC    Epithelial cells, urine per micros OCC    Crystals, Ur, HPF, POC NEG    Casts, Ur, LPF, POC NEG    Yeast, UA NEG       Assessment & Plan:   Problem List Items Addressed This Visit    None    Visit Diagnoses    Periumbilical abdominal pain    -  Primary    with right flank pain, concern for renal stone versus diverticulitis. We'll send for CT abdomen pelvis and do labs    Relevant Orders    POCT urinalysis dipstick (Completed)    POCT UA - Microscopic Only  (Completed)    Lipase    CMP14+EGFR    CBC with Differential/Platelet    CT Abdomen Pelvis Wo Contrast    CT Abdomen Pelvis W Contrast        Follow up plan: Return in about 2 weeks (around 06/22/2015), or if symptoms worsen or fail to improve, for f/u abd pain.  Caryl Pina, MD [facility] 06/08/2015, 3:56 PM

## 2015-06-09 ENCOUNTER — Other Ambulatory Visit: Payer: Self-pay | Admitting: Nurse Practitioner

## 2015-06-09 DIAGNOSIS — R1013 Epigastric pain: Secondary | ICD-10-CM

## 2015-06-09 LAB — CBC WITH DIFFERENTIAL/PLATELET
Basophils Absolute: 0 10*3/uL (ref 0.0–0.2)
Basos: 1 %
EOS (ABSOLUTE): 0.2 10*3/uL (ref 0.0–0.4)
EOS: 2 %
HEMATOCRIT: 39.7 % (ref 34.0–46.6)
Hemoglobin: 13.2 g/dL (ref 11.1–15.9)
IMMATURE GRANS (ABS): 0 10*3/uL (ref 0.0–0.1)
IMMATURE GRANULOCYTES: 0 %
LYMPHS: 41 %
Lymphocytes Absolute: 3.4 10*3/uL — ABNORMAL HIGH (ref 0.7–3.1)
MCH: 28.4 pg (ref 26.6–33.0)
MCHC: 33.2 g/dL (ref 31.5–35.7)
MCV: 86 fL (ref 79–97)
MONOS ABS: 0.4 10*3/uL (ref 0.1–0.9)
Monocytes: 5 %
NEUTROS PCT: 51 %
Neutrophils Absolute: 4.3 10*3/uL (ref 1.4–7.0)
PLATELETS: 286 10*3/uL (ref 150–379)
RBC: 4.64 x10E6/uL (ref 3.77–5.28)
RDW: 13.6 % (ref 12.3–15.4)
WBC: 8.3 10*3/uL (ref 3.4–10.8)

## 2015-06-09 LAB — CMP14+EGFR
ALK PHOS: 75 IU/L (ref 39–117)
ALT: 9 IU/L (ref 0–32)
AST: 13 IU/L (ref 0–40)
Albumin/Globulin Ratio: 1.9 (ref 1.1–2.5)
Albumin: 4 g/dL (ref 3.5–5.5)
BUN/Creatinine Ratio: 11 (ref 9–23)
BUN: 10 mg/dL (ref 6–24)
Bilirubin Total: <0.2 mg/dL (ref 0.0–1.2)
CALCIUM: 9.3 mg/dL (ref 8.7–10.2)
CO2: 27 mmol/L (ref 18–29)
Chloride: 100 mmol/L (ref 97–106)
Creatinine, Ser: 0.89 mg/dL (ref 0.57–1.00)
GFR calc Af Amer: 84 mL/min/{1.73_m2} (ref >59)
GFR calc non Af Amer: 73 mL/min/{1.73_m2} (ref >59)
GLUCOSE: 96 mg/dL (ref 65–99)
Globulin, Total: 2.1 g/dL (ref 1.5–4.5)
Potassium: 4.8 mmol/L (ref 3.5–5.2)
SODIUM: 142 mmol/L (ref 136–144)
Total Protein: 6.1 g/dL (ref 6.0–8.5)

## 2015-06-09 LAB — LIPASE: Lipase: 38 U/L (ref 0–59)

## 2015-06-09 MED ORDER — PANTOPRAZOLE SODIUM 40 MG PO TBEC: 40.0000 mg | DELAYED_RELEASE_TABLET | Freq: Every day | ORAL | Status: DC

## 2015-06-11 ENCOUNTER — Telehealth: Payer: Self-pay | Admitting: Nurse Practitioner

## 2015-06-11 NOTE — Telephone Encounter (Signed)
Pt aware of Dr. Merita Norton advice

## 2015-06-11 NOTE — Telephone Encounter (Signed)
I think it would be okay to still take her metformin, she can also take an Imodium to help control the diarrhea. Hopefully the GI referral given some better answers. Caryl Pina, MD [facility] 06/11/2015, 10:17 AM

## 2015-06-14 ENCOUNTER — Other Ambulatory Visit: Payer: Self-pay | Admitting: Nurse Practitioner

## 2015-06-14 DIAGNOSIS — E119 Type 2 diabetes mellitus without complications: Secondary | ICD-10-CM

## 2015-06-14 MED ORDER — METFORMIN HCL 500 MG PO TABS: 500.0000 mg | ORAL_TABLET | Freq: Every day | ORAL | Status: DC

## 2015-06-14 MED ORDER — GABAPENTIN 600 MG PO TABS: 600.0000 mg | ORAL_TABLET | Freq: Four times a day (QID) | ORAL | Status: DC

## 2015-06-15 NOTE — Telephone Encounter (Signed)
Go ahead and send refills, the approve all button does not show up for me to check it. Caryl Pina, MD [facility] 06/15/2015, 7:43 AM

## 2015-06-20 ENCOUNTER — Ambulatory Visit (INDEPENDENT_AMBULATORY_CARE_PROVIDER_SITE_OTHER): Payer: Medicare Other | Admitting: *Deleted

## 2015-06-20 DIAGNOSIS — D519 Vitamin B12 deficiency anemia, unspecified: Secondary | ICD-10-CM

## 2015-06-20 NOTE — Progress Notes (Signed)
Vitamin b12 injection given and tolerated well.  

## 2015-06-20 NOTE — Patient Instructions (Signed)

## 2015-06-25 ENCOUNTER — Telehealth (INDEPENDENT_AMBULATORY_CARE_PROVIDER_SITE_OTHER): Payer: Self-pay | Admitting: *Deleted

## 2015-06-25 ENCOUNTER — Other Ambulatory Visit (INDEPENDENT_AMBULATORY_CARE_PROVIDER_SITE_OTHER): Payer: Self-pay | Admitting: Internal Medicine

## 2015-06-25 ENCOUNTER — Encounter (INDEPENDENT_AMBULATORY_CARE_PROVIDER_SITE_OTHER): Payer: Self-pay | Admitting: Internal Medicine

## 2015-06-25 ENCOUNTER — Ambulatory Visit (INDEPENDENT_AMBULATORY_CARE_PROVIDER_SITE_OTHER): Payer: Medicare Other | Admitting: Internal Medicine

## 2015-06-25 VITALS — BP 110/62 | HR 65 | Temp 98.3°F | Ht 70.0 in | Wt 225.9 lb

## 2015-06-25 DIAGNOSIS — R1013 Epigastric pain: Secondary | ICD-10-CM | POA: Diagnosis not present

## 2015-06-25 DIAGNOSIS — Z800 Family history of malignant neoplasm of digestive organs: Secondary | ICD-10-CM | POA: Diagnosis not present

## 2015-06-25 DIAGNOSIS — Z1211 Encounter for screening for malignant neoplasm of colon: Secondary | ICD-10-CM

## 2015-06-25 MED ORDER — PEG 3350-KCL-NA BICARB-NACL 420 G PO SOLR: 4000.0000 mL | Freq: Once | ORAL | Status: DC

## 2015-06-25 NOTE — Telephone Encounter (Signed)
Patient needs trilyte 

## 2015-06-25 NOTE — Patient Instructions (Signed)
The risks and benefits such as perforation, bleeding, and infection were reviewed with the patient and is agreeable. 

## 2015-06-25 NOTE — Progress Notes (Signed)
Subjective:    Patient ID: [name], female    DOB: 1956/02/03, 59 y.o.   MRN: [id number]  HPI Referred by Ronnald Collum FNP for epigastric pain. The pain started 06/02/2015 after started the [facility]. The pain progressively worsened. She stopped the Welchol 2 1/2 weeks after starting. She says her epigastric pain is better now. She had epigastric pain lasted for about 4 weeks. CT scan normal. She says she was only drinking Ensure for a week due to the fullness. She says she felt full if she ate.  She is eating anything she wants now.  There has been no weight loss.  She does not have any acid reflux. She says she still has some bloating but is better. She says her stool is extremely hard. She is taking Miralax for the constipation. She is having a BM daily. No melena or BRRB.  Appetite is good. No dypsphagia. She has never undergone a colonoscopy. Grandmother had colorectal cancer in her 76s. Uncle died of esophageal cancer.   06-22-15 CT abdomen/pelvis with CM;  Abdominal pain, bloating.   Marland Kitchen  IMPRESSION: No evidence of bowel obstruction. Normal appendix. No evidence of diverticulitis.  No renal, ureteral, or bladder calculi. No hydronephrosis.  Status post cholecystectomy and hysterectomy.  No CT findings to account for the patient's abdominal pain.     Review of Systems Past Medical History  Diagnosis Date  . Multiple sclerosis ([place])   . Type 2 diabetes mellitus ([place])   . GERD (gastroesophageal reflux disease)   . COPD (chronic obstructive pulmonary disease) ([place])   . Mixed hyperlipidemia   . PONV (postoperative nausea and vomiting)   . Weakness of left side of body     DUE TO MS  . Arthritis   . Psoriasis   . Neuropathy ([place])     FEET  . Neuromuscular disorder (HCC)     MIGRAINES  . History of skin cancer   . Anxiety   . Cancer ([place])     SKIN / "BORDERLINE CERVICAL CANCER"    Past Surgical History  Procedure Laterality Date  . Right total knee  replacement  2010  . Left breast lumpectomy    . Left carpal tunnel release    . Abdominal hysterectomy    . Sinus cyst removal    . Cholecystectomy    . Wrist surgery      rt  . Shoulder surgery      RT  . Knee arthroscopy      RT  . Total knee revision  05/05/2012    Procedure: TOTAL KNEE REVISION;  Surgeon: Gearlean Alf, MD;  Location: [facility];  Service: Orthopedics;  Laterality: Right;  . Partial knee arthroplasty Left 01/22/2015    Procedure: LEFT KNEE MEDIAL UNICOMPARTMENTAL KNEE;  Surgeon: Gaynelle Arabian, MD;  Location: [facility];  Service: Orthopedics;  Laterality: Left;    Allergies  Allergen Reactions  . Fenofibrate     unknown  . Pravastatin Other (See Comments)    "flu like symptoms"   . Penicillins Other (See Comments)    Mouth gets blisters    Current Outpatient Prescriptions on File Prior to Visit  Medication Sig Dispense Refill  . albuterol (ACCUNEB) 1.25 MG/3ML nebulizer solution Take 3 mLs (1.25 mg total) by nebulization every 6 (six) hours as needed for wheezing. 75 mL 12  . albuterol (VENTOLIN HFA) 108 (90 BASE) MCG/ACT inhaler Inhale 2 puffs into the lungs every 6 (six) hours as needed. For  shortness of breath 1 Inhaler 1  . diclofenac sodium (VOLTAREN) 1 % GEL Apply topically 4 (four) times daily.    . furosemide (LASIX) 20 MG tablet Take 20 mg by mouth. As needed    . gabapentin (NEURONTIN) 600 MG tablet Take 1 tablet (600 mg total) by mouth 4 (four) times daily. 360 tablet 0  . Hypromellose (SYSTANE OVERNIGHT THERAPY) 0.3 % GEL Place 1 drop into both eyes at bedtime as needed.    . metFORMIN (GLUCOPHAGE) 500 MG tablet Take 1 tablet (500 mg total) by mouth daily with breakfast. 90 tablet 0  . pantoprazole (PROTONIX) 40 MG tablet Take 1 tablet (40 mg total) by mouth daily. 30 tablet 3  . RESTASIS 0.05 % ophthalmic emulsion   3   Current Facility-Administered Medications on File Prior to Visit  Medication Dose Route Frequency Provider Last Rate Last Dose  .  cyanocobalamin ((VITAMIN B-12)) injection 1,000 mcg  1,000 mcg Intramuscular Q30 days Mary-Margaret Hassell Done, FNP   1,000 mcg at 06/20/15 1502        Objective:   Physical Exam Blood pressure 110/62, pulse 65, temperature 98.3 F (36.8 C), height 5\' 10"  (1.778 m), weight 225 lb 14.4 oz (102.468 kg).  Alert and oriented. Skin warm and dry. Oral mucosa is moist.   . Sclera anicteric, conjunctivae is pink. Thyroid not enlarged. No cervical lymphadenopathy. Lungs clear. Heart regular rate and rhythm.  Abdomen is soft. Bowel sounds are positive. No hepatomegaly. No abdominal masses felt. No tenderness.  No edema to lower extremities. Patient is alert and oriented.      Assessment & Plan:  Epigastric pain, bloating. Most of her symptoms have resolved since d/c Welchol. Family hx of colon cancer. Needs a  colonoscopy. The risks and benefits such as perforation, bleeding, and infection were reviewed with the patient and is agreeable.

## 2015-07-02 ENCOUNTER — Ambulatory Visit (INDEPENDENT_AMBULATORY_CARE_PROVIDER_SITE_OTHER): Payer: Medicare Other | Admitting: Family Medicine

## 2015-07-02 ENCOUNTER — Encounter: Payer: Self-pay | Admitting: Family Medicine

## 2015-07-02 VITALS — BP 124/78 | HR 86 | Temp 98.0°F | Ht 70.0 in | Wt 223.2 lb

## 2015-07-02 DIAGNOSIS — J441 Chronic obstructive pulmonary disease with (acute) exacerbation: Secondary | ICD-10-CM

## 2015-07-02 MED ORDER — PREDNISONE 20 MG PO TABS: ORAL_TABLET | ORAL | Status: DC

## 2015-07-02 MED ORDER — AZITHROMYCIN 250 MG PO TABS: ORAL_TABLET | ORAL | Status: DC

## 2015-07-02 MED ORDER — FLUTICASONE PROPIONATE 50 MCG/ACT NA SUSP: 1.0000 | Freq: Two times a day (BID) | NASAL | Status: DC | PRN

## 2015-07-02 NOTE — Progress Notes (Signed)
BP 124/78 mmHg  Pulse 86  Temp(Src) 98 F (36.7 C) (Oral)  Ht 5' 10"  (1.778 m)  Wt 223 lb 3.2 oz (101.243 kg)  BMI 32.03 kg/m2   Subjective:    Patient ID: [name], female    DOB: 1956-01-27, 59 y.o.   MRN: [id number]  HPI: [name] is a 59 y.o. female presenting on 07/02/2015 for Cough and Bronchitis   HPI Cough and congestion and wheezing Patient has a three-day history of cough and congestion and wheezing that is worsening. She denies fevers or chills. She does complain of shortness of breath on exertion. She has coughing spells that are worse at night and early morning. Albuterol inhaler and Claritin D and they're helping some but not completely. She continues to smoke over a half pack per day per her. She is not ready to quit today. She has had 2 exacerbations like this already this winter but usually does not have more than one year and is able to prevent them from with Claritin-D. She also complains of sinus pressure and congestion and postnasal drainage.  Relevant past medical, surgical, family and social history reviewed and updated as indicated. Interim medical history since our last visit reviewed. Allergies and medications reviewed and updated.  Review of Systems  Constitutional: Negative for fever and chills.  HENT: Positive for congestion, postnasal drip, rhinorrhea, sinus pressure and sore throat. Negative for ear discharge, ear pain and sneezing.   Eyes: Negative for pain, redness and visual disturbance.  Respiratory: Positive for cough, chest tightness, shortness of breath and wheezing.   Cardiovascular: Negative for chest pain, palpitations and leg swelling.  Genitourinary: Negative for dysuria and difficulty urinating.  Musculoskeletal: Negative for back pain and gait problem.  Skin: Negative for rash.  Neurological: Negative for light-headedness and headaches.  Psychiatric/Behavioral: Negative for behavioral problems and agitation.  All other  systems reviewed and are negative.   Per HPI unless specifically indicated above     Medication List       This list is accurate as of: 07/02/15  9:59 AM.  Always use your most recent med list.               albuterol 108 (90 BASE) MCG/ACT inhaler  Commonly known as:  VENTOLIN HFA  Inhale 2 puffs into the lungs every 6 (six) hours as needed. For shortness of breath     albuterol 1.25 MG/3ML nebulizer solution  Commonly known as:  ACCUNEB  Take 3 mLs (1.25 mg total) by nebulization every 6 (six) hours as needed for wheezing.     azithromycin 250 MG tablet  Commonly known as:  ZITHROMAX  Take 2 the first day and then one each day after.     cyanocobalamin 1000 MCG tablet  Inject 100 mcg into the skin every 30 (thirty) days.     diclofenac sodium 1 % Gel  Commonly known as:  VOLTAREN  Apply topically 4 (four) times daily.     fluticasone 50 MCG/ACT nasal spray  Commonly known as:  FLONASE  Place 1 spray into both nostrils 2 (two) times daily as needed for allergies or rhinitis.     furosemide 20 MG tablet  Commonly known as:  LASIX  Take 20 mg by mouth. As needed     gabapentin 600 MG tablet  Commonly known as:  NEURONTIN  Take 1 tablet (600 mg total) by mouth 4 (four) times daily.     loratadine 10 MG tablet  Commonly known as:  CLARITIN  Take 10 mg by mouth daily.     metFORMIN 500 MG tablet  Commonly known as:  GLUCOPHAGE  Take 1 tablet (500 mg total) by mouth daily with breakfast.     pantoprazole 40 MG tablet  Commonly known as:  PROTONIX  Take 1 tablet (40 mg total) by mouth daily.     predniSONE 20 MG tablet  Commonly known as:  DELTASONE  2 po at same time daily for 5 days     RESTASIS 0.05 % ophthalmic emulsion  Generic drug:  cycloSPORINE     SYSTANE OVERNIGHT THERAPY 0.3 % Gel ophthalmic ointment  Generic drug:  hypromellose  Place 1 drop into both eyes at bedtime as needed.           Objective:    BP 124/78 mmHg  Pulse 86   Temp(Src) 98 F (36.7 C) (Oral)  Ht 5' 10"  (1.778 m)  Wt 223 lb 3.2 oz (101.243 kg)  BMI 32.03 kg/m2  Wt Readings from Last 3 Encounters:  07/02/15 223 lb 3.2 oz (101.243 kg)  06/25/15 225 lb 14.4 oz (102.468 kg)  06/08/15 226 lb 3.2 oz (102.604 kg)    Physical Exam  Constitutional: She is oriented to person, place, and time. She appears well-developed and well-nourished. No distress.  HENT:  Right Ear: Tympanic membrane, external ear and ear canal normal.  Left Ear: Tympanic membrane, external ear and ear canal normal.  Nose: Mucosal edema and rhinorrhea present. No epistaxis. Right sinus exhibits maxillary sinus tenderness. Right sinus exhibits no frontal sinus tenderness. Left sinus exhibits maxillary sinus tenderness. Left sinus exhibits no frontal sinus tenderness.  Mouth/Throat: Uvula is midline and mucous membranes are normal. Posterior oropharyngeal edema and posterior oropharyngeal erythema present. No oropharyngeal exudate or tonsillar abscesses.  Eyes: Conjunctivae and EOM are normal.  Cardiovascular: Normal rate, regular rhythm, normal heart sounds and intact distal pulses.   No murmur heard. Pulmonary/Chest: Effort normal. No accessory muscle usage. No tachypnea. No respiratory distress. She has no decreased breath sounds. She has wheezes. She has no rhonchi. She has no rales.  Musculoskeletal: Normal range of motion. She exhibits no edema or tenderness.  Neurological: She is alert and oriented to person, place, and time. Coordination normal.  Skin: Skin is warm and dry. No rash noted. She is not diaphoretic.  Psychiatric: She has a normal mood and affect. Her behavior is normal.  Vitals reviewed.   Results for orders placed or performed in visit on 06/08/15  Lipase  Result Value Ref Range   Lipase 38 0 - 59 U/L  CMP14+EGFR  Result Value Ref Range   Glucose 96 65 - 99 mg/dL   BUN 10 6 - 24 mg/dL   Creatinine, Ser 0.89 0.57 - 1.00 mg/dL   GFR calc non Af Amer 73 >59  mL/min/1.73   GFR calc Af Amer 84 >59 mL/min/1.73   BUN/Creatinine Ratio 11 9 - 23   Sodium 142 136 - 144 mmol/L   Potassium 4.8 3.5 - 5.2 mmol/L   Chloride 100 97 - 106 mmol/L   CO2 27 18 - 29 mmol/L   Calcium 9.3 8.7 - 10.2 mg/dL   Total Protein 6.1 6.0 - 8.5 g/dL   Albumin 4.0 3.5 - 5.5 g/dL   Globulin, Total 2.1 1.5 - 4.5 g/dL   Albumin/Globulin Ratio 1.9 1.1 - 2.5   Bilirubin Total <0.2 0.0 - 1.2 mg/dL   Alkaline Phosphatase 75 39 - 117 IU/L  AST 13 0 - 40 IU/L   ALT 9 0 - 32 IU/L  CBC with Differential/Platelet  Result Value Ref Range   WBC 8.3 3.4 - 10.8 x10E3/uL   RBC 4.64 3.77 - 5.28 x10E6/uL   Hemoglobin 13.2 11.1 - 15.9 g/dL   Hematocrit 39.7 34.0 - 46.6 %   MCV 86 79 - 97 fL   MCH 28.4 26.6 - 33.0 pg   MCHC 33.2 31.5 - 35.7 g/dL   RDW 13.6 12.3 - 15.4 %   Platelets 286 150 - 379 x10E3/uL   Neutrophils 51 %   Lymphs 41 %   Monocytes 5 %   Eos 2 %   Basos 1 %   Neutrophils Absolute 4.3 1.4 - 7.0 x10E3/uL   Lymphocytes Absolute 3.4 (H) 0.7 - 3.1 x10E3/uL   Monocytes Absolute 0.4 0.1 - 0.9 x10E3/uL   EOS (ABSOLUTE) 0.2 0.0 - 0.4 x10E3/uL   Basophils Absolute 0.0 0.0 - 0.2 x10E3/uL   Immature Granulocytes 0 %   Immature Grans (Abs) 0.0 0.0 - 0.1 x10E3/uL  POCT urinalysis dipstick  Result Value Ref Range   Color, UA YELLOW    Clarity, UA CLEAR    Glucose, UA NEG    Bilirubin, UA NEG    Ketones, UA NEG    Spec Grav, UA >=1.030    Blood, UA TRACE    pH, UA 5.0    Protein, UA NEG    Urobilinogen, UA negative    Nitrite, UA NEG    Leukocytes, UA Trace (A) Negative  POCT UA - Microscopic Only  Result Value Ref Range   WBC, Ur, HPF, POC 1-5    RBC, urine, microscopic 5-7    Bacteria, U Microscopic NEG    Mucus, UA OCC    Epithelial cells, urine per micros OCC    Crystals, Ur, HPF, POC NEG    Casts, Ur, LPF, POC NEG    Yeast, UA NEG       Assessment & Plan:   Problem List Items Addressed This Visit    None    Visit Diagnoses    COPD exacerbation  (HCC)    -  Primary    Relevant Medications    fluticasone (FLONASE) 50 MCG/ACT nasal spray    azithromycin (ZITHROMAX) 250 MG tablet    predniSONE (DELTASONE) 20 MG tablet        Follow up plan: Return in about 4 weeks (around 07/30/2015), or if symptoms worsen or fail to improve, for Recheck COPD when healthy.  Counseling provided for all of the vaccine components No orders of the defined types were placed in this encounter.    Caryl Pina, MD [facility] 07/02/2015, 9:59 AM

## 2015-07-20 ENCOUNTER — Ambulatory Visit (INDEPENDENT_AMBULATORY_CARE_PROVIDER_SITE_OTHER): Payer: Medicare Other | Admitting: *Deleted

## 2015-07-20 DIAGNOSIS — D519 Vitamin B12 deficiency anemia, unspecified: Secondary | ICD-10-CM | POA: Diagnosis not present

## 2015-07-20 NOTE — Progress Notes (Signed)
Pt given B12 injection IM right deltoid and tolerated well. 

## 2015-07-20 NOTE — Patient Instructions (Signed)

## 2015-07-30 ENCOUNTER — Encounter: Payer: Self-pay | Admitting: Pediatrics

## 2015-07-30 ENCOUNTER — Ambulatory Visit (INDEPENDENT_AMBULATORY_CARE_PROVIDER_SITE_OTHER): Payer: Medicare Other | Admitting: Pediatrics

## 2015-07-30 VITALS — BP 114/55 | HR 72 | Temp 98.2°F | Ht 70.0 in | Wt 230.2 lb

## 2015-07-30 DIAGNOSIS — F411 Generalized anxiety disorder: Secondary | ICD-10-CM | POA: Diagnosis not present

## 2015-07-30 DIAGNOSIS — Z6831 Body mass index (BMI) 31.0-31.9, adult: Secondary | ICD-10-CM

## 2015-07-30 DIAGNOSIS — E1142 Type 2 diabetes mellitus with diabetic polyneuropathy: Secondary | ICD-10-CM | POA: Diagnosis not present

## 2015-07-30 DIAGNOSIS — J449 Chronic obstructive pulmonary disease, unspecified: Secondary | ICD-10-CM

## 2015-07-30 DIAGNOSIS — Z720 Tobacco use: Secondary | ICD-10-CM | POA: Diagnosis not present

## 2015-07-30 DIAGNOSIS — D519 Vitamin B12 deficiency anemia, unspecified: Secondary | ICD-10-CM | POA: Diagnosis not present

## 2015-07-30 MED ORDER — VENLAFAXINE HCL ER 37.5 MG PO CP24: 37.5000 mg | ORAL_CAPSULE | Freq: Every day | ORAL | Status: DC

## 2015-07-30 NOTE — Progress Notes (Signed)
Subjective:    Patient ID: [name], female    DOB: [birth year], 59 y.o.   MRN: [id number]  CC: Follow-up DM2 and multiple med problems  HPI: [name] is a 59 y.o. female presenting for Follow-up  DM2: Lots of cookies and candy recently with holidays  Neuropathy: on gabapentin 600mg  four times a day. Still has symptoms  Menopause: hot flashes continue, come and go  Depression: Tried cymbalta, had suicidal thoughts. Tried prozac in the past. Was seen by psychiatrist years ago  L knee tendonisitis: L knee replaced. Voltaren gel didn't help. Trying tea tree oil over past two days, has helped  Tobacco use: husband smokes in tha car, both she and husband smoke outside. She is to 3/4 pack a day now.  BMI 31 Tries to walk some, up to 1/2 a mile, limited by pain in knee and foot neuropathy pain  No chest pain with exertion, no SOB, though she can tell after being sick breathing takes a while to come back to normal   Depression screen [facility] 2/9 07/30/2015 07/02/2015 06/08/2015 04/30/2015 03/28/2015  Decreased Interest 0 0 0 0 3  Down, Depressed, Hopeless 0 0 0 0 2  PHQ - 2 Score 0 0 0 0 5  Altered sleeping - - - - 1  Tired, decreased energy - - - - 3  Change in appetite - - - - 3  Feeling bad or failure about yourself  - - - - 2  Trouble concentrating - - - - 2  Moving slowly or fidgety/restless - - - - 0  Suicidal thoughts - - - - 0  PHQ-9 Score - - - - 16     Relevant past medical, surgical, family and social history reviewed and updated as indicated. Interim medical history since our last visit reviewed. Allergies and medications reviewed and updated.    ROS: Per HPI unless specifically indicated above  Past Medical History Patient Active Problem List   Diagnosis Date Noted  . Generalized anxiety disorder 07/30/2015  . OA (osteoarthritis) of knee 01/22/2015  . B12 deficiency anemia 03/20/2014  . BMI 31.0-31.9,adult 03/20/2014  . Gastroesophageal reflux  disease without esophagitis 02/21/2014  . Diabetic neuropathy ([place]) 12/12/2013  . Diabetes ([place]) 01/17/2013  . Multiple sclerosis ([place]) 01/17/2013  . COPD with chronic bronchitis ([place]) 01/17/2013  . Mixed hyperlipidemia 03/30/2012  . Tobacco abuse 03/30/2012      Objective:    BP 114/55 mmHg  Pulse 72  Temp(Src) 98.2 F (36.8 C) (Oral)  Ht 5\' 10"  (1.778 m)  Wt 230 lb 3.2 oz (104.418 kg)  BMI 33.03 kg/m2  Wt Readings from Last 3 Encounters:  07/30/15 230 lb 3.2 oz (104.418 kg)  07/02/15 223 lb 3.2 oz (101.243 kg)  06/25/15 225 lb 14.4 oz (102.468 kg)     Gen: NAD, alert, cooperative with exam, NCAT EYES: EOMI, no scleral injection or icterus ENT:  TMs pearly gray b/l, OP without erythema LYMPH: no cervical LAD CV: NRRR, normal S1/S2, no murmur, DP  pulses 2+ b/l Resp: CTABL, no wheezes, normal WOB Abd: +BS, soft, NTND. no guarding or organomegaly Ext: No edema, warm Neuro: Alert and oriented, strength equal b/l UE and LE, coordination grossly normal MSK: normal muscle bulk Skin: 2-5th toes with decreased feeling on sole side b/l. Otherwise feeling intact to monofilament b/l     Assessment & Plan:    Buddy was seen today for follow-up multiple med problems  Diagnoses and all  orders for this visit:  Type 2 diabetes mellitus with diabetic polyneuropathy, without long-term current use of insulin ([place]) More indiscretions recently per pt, last HgA1c 6.3 three months ago. Will check below, on metformin alone now. Continue. -     Microalbumin/Creatinine Ratio, Urine -     POCT glycosylated hemoglobin (Hb A1C)  COPD with chronic bronchitis (HCC) Not needing albuteorl regularly. Needs to stop smoking.  Tobacco use disorder More than 3 minutes spent discussing smoking cessation strategies.  Diabetic polyneuropathy associated with type 2 diabetes mellitus (HCC) Continue gabapentin. Use capsaicin when needed. Start venlafaxine. -     venlafaxine XR (EFFEXOR-XR) 37.5 MG 24 hr  capsule; Take 1 capsule (37.5 mg total) by mouth daily with breakfast for one week, then increase to two caps daily.  B12 deficiency anemia Had B12 shot last week  BMI 31.0-31.9,adult  Discussed lifestyle and diet changes.  Generalized anxiety disorder Venlafaxine as above. Gave a list of cousnelors for the area, strongly encouraged f/u with therapy.    Follow up plan: 6 weeks  Assunta Found, MD [facility] 07/30/2015, 8:41 AM

## 2015-07-30 NOTE — Patient Instructions (Addendum)
Take one pill for first week (37.5mg ). Then increase to 2 pills (75mg ).  Come back in 6 weeks for recheck symptoms, sooner if needed.  Try capsacin pepper cream for neuropathy  24 hour a day CRISIS NUMBER: [phone]   List of local counseling services:  [facility] [address] [place] ,[place] [address] [phone] Children limited to anxiety and depression- NO ADD/ADHD Does not accept Medicaid  [facility] [address]. [place], [place] Does see children Does accept medicaid Will assess for Autism but not treat  [facility] [place]. [address] [place] [phone] Does see children  Does accept Medicaid Medication management- substance abuse- bipolar- grief- family-marriage- OCD- Anxiety- PTSD  [facility] Does see children Does accept medicaid They do perform psychological testing  [place] [address] [place] Schedule through [place]. [phone] Patient must call and make own appointment Does se children Does accept Medicaid  [facility] [place], [place] 7-10 accompanied by an adult, 11 and up by themselves Does accept Medicaid Will see patients with- substance abuse-ADHD-ADD-Bipolar-Domestic violence-Marriage counseling- Family Counseling and sexual abuse  [facility] [address] [place] [phone] Does see children Does accept [facility] [place] [phone]  Dr. Sabra Heck-  Psychiatrist [address] [place], [place] Specializes in ADHD and addictions They do ADHD testing Suboxone clinic  [facility] [address] [place] Does Child psychological testing  [facility] [address] Dr. Dellia [place] [phone] Does Accept Medicaid Evaluates for Autism  [facility] [place] [phone] Does Not accept Medicaid Does do adult ADD evaluations  Dr. Lorenza Evangelist [address] [place] [phone] Does not Take Medicaid Sees ADD and ADHD for treatment      [facility] [address] [place], [place] [address] [phone] Takes Medicaid WIll see children as young as 51

## 2015-07-31 ENCOUNTER — Telehealth: Payer: Self-pay | Admitting: Nurse Practitioner

## 2015-07-31 LAB — MICROALBUMIN / CREATININE URINE RATIO
CREATININE, UR: 103.4 mg/dL
MICROALB/CREAT RATIO: 14.8 mg/g{creat} (ref 0.0–30.0)
Microalbumin, Urine: 15.3 ug/mL

## 2015-07-31 MED ORDER — VENLAFAXINE HCL ER 75 MG PO CP24: 75.0000 mg | ORAL_CAPSULE | Freq: Every day | ORAL | Status: DC

## 2015-07-31 NOTE — Telephone Encounter (Signed)
[employer] would only allow 30 capsules of Venlafaxine 37.5mg  [facility] 75mg  Okayed per Dr Evette Doffing

## 2015-08-01 ENCOUNTER — Other Ambulatory Visit: Payer: Self-pay

## 2015-08-02 ENCOUNTER — Encounter (HOSPITAL_COMMUNITY)
Admission: RE | Disposition: A | Discharge status: Home / Self Care | Payer: Self-pay | Source: Ambulatory Visit | Attending: Internal Medicine

## 2015-08-02 ENCOUNTER — Ambulatory Visit (HOSPITAL_COMMUNITY)
Admission: RE | Admit: 2015-08-02 | Discharge: 2015-08-02 | Disposition: A | Discharge status: Home / Self Care | Payer: Medicare Other | Source: Ambulatory Visit | Attending: Internal Medicine | Admitting: Internal Medicine

## 2015-08-02 ENCOUNTER — Encounter (HOSPITAL_COMMUNITY): Payer: Self-pay

## 2015-08-02 DIAGNOSIS — Z8371 Family history of colonic polyps: Secondary | ICD-10-CM | POA: Insufficient documentation

## 2015-08-02 DIAGNOSIS — Z880 Allergy status to penicillin: Secondary | ICD-10-CM | POA: Diagnosis not present

## 2015-08-02 DIAGNOSIS — M1990 Unspecified osteoarthritis, unspecified site: Secondary | ICD-10-CM | POA: Diagnosis not present

## 2015-08-02 DIAGNOSIS — J449 Chronic obstructive pulmonary disease, unspecified: Secondary | ICD-10-CM | POA: Diagnosis not present

## 2015-08-02 DIAGNOSIS — F17200 Nicotine dependence, unspecified, uncomplicated: Secondary | ICD-10-CM | POA: Diagnosis not present

## 2015-08-02 DIAGNOSIS — E782 Mixed hyperlipidemia: Secondary | ICD-10-CM | POA: Insufficient documentation

## 2015-08-02 DIAGNOSIS — G35 Multiple sclerosis: Secondary | ICD-10-CM | POA: Diagnosis not present

## 2015-08-02 DIAGNOSIS — K644 Residual hemorrhoidal skin tags: Secondary | ICD-10-CM | POA: Diagnosis not present

## 2015-08-02 DIAGNOSIS — Z800 Family history of malignant neoplasm of digestive organs: Secondary | ICD-10-CM | POA: Diagnosis present

## 2015-08-02 DIAGNOSIS — E119 Type 2 diabetes mellitus without complications: Secondary | ICD-10-CM | POA: Diagnosis not present

## 2015-08-02 DIAGNOSIS — Z90710 Acquired absence of both cervix and uterus: Secondary | ICD-10-CM | POA: Insufficient documentation

## 2015-08-02 DIAGNOSIS — K219 Gastro-esophageal reflux disease without esophagitis: Secondary | ICD-10-CM | POA: Insufficient documentation

## 2015-08-02 DIAGNOSIS — K6389 Other specified diseases of intestine: Secondary | ICD-10-CM | POA: Diagnosis not present

## 2015-08-02 DIAGNOSIS — Z1211 Encounter for screening for malignant neoplasm of colon: Secondary | ICD-10-CM | POA: Diagnosis not present

## 2015-08-02 DIAGNOSIS — F419 Anxiety disorder, unspecified: Secondary | ICD-10-CM | POA: Diagnosis not present

## 2015-08-02 DIAGNOSIS — G629 Polyneuropathy, unspecified: Secondary | ICD-10-CM | POA: Insufficient documentation

## 2015-08-02 DIAGNOSIS — Z85828 Personal history of other malignant neoplasm of skin: Secondary | ICD-10-CM | POA: Insufficient documentation

## 2015-08-02 DIAGNOSIS — Z7984 Long term (current) use of oral hypoglycemic drugs: Secondary | ICD-10-CM | POA: Diagnosis not present

## 2015-08-02 DIAGNOSIS — Z79899 Other long term (current) drug therapy: Secondary | ICD-10-CM | POA: Insufficient documentation

## 2015-08-02 HISTORY — PX: COLONOSCOPY: SHX5424

## 2015-08-02 LAB — GLUCOSE, CAPILLARY: Glucose-Capillary: 99 mg/dL (ref 65–99)

## 2015-08-02 SURGERY — COLONOSCOPY
Anesthesia: Moderate Sedation

## 2015-08-02 MED ORDER — MIDAZOLAM HCL 5 MG/5ML IJ SOLN
INTRAMUSCULAR | Status: DC | PRN
  Administered 2015-08-02: 3 mg via INTRAVENOUS
  Administered 2015-08-02 (×3): 2 mg via INTRAVENOUS

## 2015-08-02 MED ORDER — MEPERIDINE HCL 50 MG/ML IJ SOLN
INTRAMUSCULAR | Status: DC | PRN
  Administered 2015-08-02 (×2): 25 mg via INTRAVENOUS

## 2015-08-02 MED ORDER — SODIUM CHLORIDE 0.9 % IV SOLN
INTRAVENOUS | Status: DC
  Administered 2015-08-02: 13:00:00 via INTRAVENOUS

## 2015-08-02 MED ORDER — CEFAZOLIN SODIUM-DEXTROSE 2-3 GM-% IV SOLR
2.0000 g | Freq: Once | INTRAVENOUS | Status: AC
  Administered 2015-08-02: 2 g via INTRAVENOUS
  Filled 2015-08-02: qty 50

## 2015-08-02 MED ORDER — SODIUM CHLORIDE 0.9 % IV SOLN: INTRAVENOUS | Status: DC

## 2015-08-02 MED ORDER — LEVOFLOXACIN IN D5W 500 MG/100ML IV SOLN
500.0000 mg | INTRAVENOUS | Status: DC
  Administered 2015-08-02: 500 mg via INTRAVENOUS
  Filled 2015-08-02: qty 100

## 2015-08-02 MED ORDER — MEPERIDINE HCL 50 MG/ML IJ SOLN
INTRAMUSCULAR | Status: AC
  Filled 2015-08-02: qty 1

## 2015-08-02 MED ORDER — STERILE WATER FOR IRRIGATION IR SOLN
Status: DC | PRN
  Administered 2015-08-02: 14:00:00

## 2015-08-02 MED ORDER — MIDAZOLAM HCL 5 MG/5ML IJ SOLN
INTRAMUSCULAR | Status: AC
  Filled 2015-08-02: qty 10

## 2015-08-02 NOTE — H&P (Signed)
[name] is an 59 y.o. female.   Chief Complaint: Patient is here for colonoscopy. HPI: This 59 year old Caucasian female who is in for screening colonoscopy. She denies rectal bleeding or melena. She developed constipation alcohol but has been discontinued. Family history significant for CRC in maternal grandmother who was 42 at the time of diagnosis and internal uncle had esophageal cancer. Her father had colonic polyps in her sister has colonic polyps.  Past Medical History  Diagnosis Date  . Multiple sclerosis ([place])   . Type 2 diabetes mellitus ([place])   . GERD (gastroesophageal reflux disease)   . COPD (chronic obstructive pulmonary disease) ([place])   . Mixed hyperlipidemia   . PONV (postoperative nausea and vomiting)   . Weakness of left side of body     DUE TO MS  . Arthritis   . Psoriasis   . Neuropathy ([place])     FEET  . Neuromuscular disorder (HCC)     MIGRAINES  . History of skin cancer   . Anxiety   . Cancer ([place])     SKIN / "BORDERLINE CERVICAL CANCER"    Past Surgical History  Procedure Laterality Date  . Right total knee replacement  2010  . Left breast lumpectomy    . Left carpal tunnel release    . Abdominal hysterectomy    . Sinus cyst removal    . Cholecystectomy    . Wrist surgery      rt  . Shoulder surgery      RT  . Knee arthroscopy      RT  . Total knee revision  05/05/2012    Procedure: TOTAL KNEE REVISION;  Surgeon: Gearlean Alf, MD;  Location: [facility];  Service: Orthopedics;  Laterality: Right;  . Partial knee arthroplasty Left 01/22/2015    Procedure: LEFT KNEE MEDIAL UNICOMPARTMENTAL KNEE;  Surgeon: Gaynelle Arabian, MD;  Location: [facility];  Service: Orthopedics;  Laterality: Left;    Family History  Problem Relation Age of Onset  . Dementia Mother   . Atrial fibrillation Father     Died with pulmonary embolus   Social History:  reports that she has been smoking Cigarettes.  She has a 20 pack-year smoking history. She has never used  smokeless tobacco. She reports that she does not drink alcohol or use illicit drugs.  Allergies:  Allergies  Allergen Reactions  . Fenofibrate     unknown  . Pravastatin Other (See Comments)    "flu like symptoms"   . Penicillins Other (See Comments)    Mouth gets blisters    Medications Prior to Admission  Medication Sig Dispense Refill  . albuterol (ACCUNEB) 1.25 MG/3ML nebulizer solution Take 3 mLs (1.25 mg total) by nebulization every 6 (six) hours as needed for wheezing. 75 mL 12  . albuterol (VENTOLIN HFA) 108 (90 BASE) MCG/ACT inhaler Inhale 2 puffs into the lungs every 6 (six) hours as needed. For shortness of breath 1 Inhaler 1  . cyanocobalamin 1000 MCG tablet Inject 100 mcg into the skin every 30 (thirty) days.    Marland Kitchen gabapentin (NEURONTIN) 600 MG tablet Take 1 tablet (600 mg total) by mouth 4 (four) times daily. 360 tablet 0  . Hypromellose (SYSTANE OVERNIGHT THERAPY) 0.3 % GEL Place 1 drop into both eyes at bedtime as needed.    . loratadine (CLARITIN) 10 MG tablet Take 10 mg by mouth daily.    . metFORMIN (GLUCOPHAGE) 500 MG tablet Take 1 tablet (500 mg total) by mouth  daily with breakfast. 90 tablet 0  . pantoprazole (PROTONIX) 40 MG tablet Take 1 tablet (40 mg total) by mouth daily. 30 tablet 3  . RESTASIS 0.05 % ophthalmic emulsion   3  . furosemide (LASIX) 20 MG tablet Take 20 mg by mouth. As needed    . venlafaxine XR (EFFEXOR XR) 75 MG 24 hr capsule Take 1 capsule (75 mg total) by mouth daily with breakfast. 30 capsule 1  . venlafaxine XR (EFFEXOR-XR) 37.5 MG 24 hr capsule Take 1 capsule (37.5 mg total) by mouth daily with breakfast. For 1 week, then increase to 2 caps daily. 60 capsule 1    No results found for this or any previous visit (from the past 48 hour(s)). No results found.  ROS  Blood pressure 143/73, pulse 71, temperature 98.2 F (36.8 C), temperature source Oral, resp. rate 15, SpO2 100 %. Physical Exam  Constitutional: She appears well-developed  and well-nourished.  HENT:  Mouth/Throat: Oropharynx is clear and moist.  Eyes: Conjunctivae are normal. No scleral icterus.  Neck: No thyromegaly present.  Cardiovascular: Normal rate, regular rhythm and normal heart sounds.   No murmur heard. Respiratory: Effort normal and breath sounds normal.  GI: Soft. She exhibits no distension and no mass. There is no tenderness.  Musculoskeletal: She exhibits no edema.  Lymphadenopathy:    She has no cervical adenopathy.  Neurological: She is alert.  Skin: Skin is warm and dry.     Assessment/Plan Average risk screening colonoscopy. History of colonic polyps in father(diceased) and sister.  REHMAN,NAJEEB U 08/02/2015, 2:19 PM

## 2015-08-02 NOTE — Op Note (Signed)
COLONOSCOPY PROCEDURE REPORT  PATIENT:  [name]  MR#:  [id number] Birthdate:  [birth year], 59 y.o., female Endoscopist:  Dr. Rogene Houston, MD Referred By: Ms. Chevis Pretty FNP  Procedure Date: 08/02/2015  Procedure:   Colonoscopy  Indications:  Average risk screening colonoscopy.  Informed Consent:  The procedure and risks were reviewed with the patient and informed consent was obtained.  Medications:  Demerol 50 mg IV Versed 10 mg IV  Description of procedure:  After a digital rectal exam was performed, that colonoscope was advanced from the anus through the rectum and colon to the area of the cecum, ileocecal valve and appendiceal orifice. The cecum was deeply intubated. These structures were well-seen and photographed for the record. From the level of the cecum and ileocecal valve, the scope was slowly and cautiously withdrawn. The mucosal surfaces were carefully surveyed utilizing scope tip to flexion to facilitate fold flattening as needed. The scope was pulled down into the rectum where a thorough exam including retroflexion was performed.  Findings:   Prep excellent. Normal mucosa of cecum, ascending colon, hepatic flexure, transverse colon, splenic flexure, descending and sigmoid colon. Normal rectal mucosa. Small hemorrhoids below the dentate line along with tiny anal papilla.   Therapeutic/Diagnostic Maneuvers Performed:  See  None  Complications:  None  EBL: None  Cecal Withdrawal Time:  9 minutes  Impression:  Examination performed to cecum. Small external hemorrhoids and anal papilla otherwise normal colonoscopy.  Recommendations:  Standard instructions given. Unless patient's family history changes she can wait 10 years before next screening exam.  Bayan Kushnir U  08/02/2015 2:50 PM  CC: Dr. Chevis Pretty, FNP & Dr. Rayne Du ref. provider found

## 2015-08-02 NOTE — Discharge Instructions (Signed)
Resume usual medications and diet. No driving for 24 hours. Unless family history changes next screening exam in 10 years..    Colonoscopy, Care After These instructions give you information on caring for yourself after your procedure. Your doctor may also give you more specific instructions. Call your doctor if you have any problems or questions after your procedure. HOME CARE  Do not drive for 24 hours.  Do not sign important papers or use machinery for 24 hours.  You may shower.  You may go back to your usual activities, but go slower for the first 24 hours.  Take rest breaks often during the first 24 hours.  Walk around or use warm packs on your belly (abdomen) if you have belly cramping or gas.  Drink enough fluids to keep your pee (urine) clear or pale yellow.  Resume your normal diet. Avoid heavy or fried foods.  Avoid drinking alcohol for 24 hours or as told by your doctor.  Only take medicines as told by your doctor. If a tissue sample (biopsy) was taken during the procedure:   Do not take aspirin or blood thinners for 7 days, or as told by your doctor.  Do not drink alcohol for 7 days, or as told by your doctor.  Eat soft foods for the first 24 hours. GET HELP IF: You still have a small amount of blood in your poop (stool) 2-3 days after the procedure. GET HELP RIGHT AWAY IF:  You have more than a small amount of blood in your poop.  You see clumps of tissue (blood clots) in your poop.  Your belly is puffy (swollen).  You feel sick to your stomach (nauseous) or throw up (vomit).  You have a fever.  You have belly pain that gets worse and medicine does not help. MAKE SURE YOU:  Understand these instructions.  Will watch your condition.  Will get help right away if you are not doing well or get worse.   This information is not intended to replace advice given to you by your health care provider. Make sure you discuss any questions you have with your  health care provider.   Document Released: 08/30/2010 Document Revised: 08/02/2013 Document Reviewed: 04/04/2013 Elsevier Interactive Patient Education 2016 [employer].   Hemorrhoids Hemorrhoids are swollen veins around the rectum or anus. There are two types of hemorrhoids:   Internal hemorrhoids. These occur in the veins just inside the rectum. They may poke through to the outside and become irritated and painful.  External hemorrhoids. These occur in the veins outside the anus and can be felt as a painful swelling or hard lump near the anus. CAUSES  Pregnancy.   Obesity.   Constipation or diarrhea.   Straining to have a bowel movement.   Sitting for long periods on the toilet.  Heavy lifting or other activity that caused you to strain.  Anal intercourse. SYMPTOMS   Pain.   Anal itching or irritation.   Rectal bleeding.   Fecal leakage.   Anal swelling.   One or more lumps around the anus.  DIAGNOSIS  Your caregiver may be able to diagnose hemorrhoids by visual examination. Other examinations or tests that may be performed include:   Examination of the rectal area with a gloved hand (digital rectal exam).   Examination of anal canal using a small tube (scope).   A blood test if you have lost a significant amount of blood.  A test to look inside the colon (sigmoidoscopy  or colonoscopy). TREATMENT Most hemorrhoids can be treated at home. However, if symptoms do not seem to be getting better or if you have a lot of rectal bleeding, your caregiver may perform a procedure to help make the hemorrhoids get smaller or remove them completely. Possible treatments include:   Placing a rubber band at the base of the hemorrhoid to cut off the circulation (rubber band ligation).   Injecting a chemical to shrink the hemorrhoid (sclerotherapy).   Using a tool to burn the hemorrhoid (infrared light therapy).   Surgically removing the hemorrhoid  (hemorrhoidectomy).   Stapling the hemorrhoid to block blood flow to the tissue (hemorrhoid stapling).  HOME CARE INSTRUCTIONS   Eat foods with fiber, such as whole grains, beans, nuts, fruits, and vegetables. Ask your doctor about taking products with added fiber in them (fibersupplements).  Increase fluid intake. Drink enough water and fluids to keep your urine clear or pale yellow.   Exercise regularly.   Go to the bathroom when you have the urge to have a bowel movement. Do not wait.   Avoid straining to have bowel movements.   Keep the anal area dry and clean. Use wet toilet paper or moist towelettes after a bowel movement.   Medicated creams and suppositories may be used or applied as directed.   Only take over-the-counter or prescription medicines as directed by your caregiver.   Take warm sitz baths for 15-20 minutes, 3-4 times a day to ease pain and discomfort.   Place ice packs on the hemorrhoids if they are tender and swollen. Using ice packs between sitz baths may be helpful.   Put ice in a plastic bag.   Place a towel between your skin and the bag.   Leave the ice on for 15-20 minutes, 3-4 times a day.   Do not use a donut-shaped pillow or sit on the toilet for long periods. This increases blood pooling and pain.  SEEK MEDICAL CARE IF:  You have increasing pain and swelling that is not controlled by treatment or medicine.  You have uncontrolled bleeding.  You have difficulty or you are unable to have a bowel movement.  You have pain or inflammation outside the area of the hemorrhoids. MAKE SURE YOU:  Understand these instructions.  Will watch your condition.  Will get help right away if you are not doing well or get worse.   This information is not intended to replace advice given to you by your health care provider. Make sure you discuss any questions you have with your health care provider.   Document Released: 07/25/2000 Document  Revised: 07/14/2012 Document Reviewed: 06/01/2012 Elsevier Interactive Patient Education [employer].

## 2015-08-08 ENCOUNTER — Encounter (HOSPITAL_COMMUNITY): Payer: Self-pay | Admitting: Internal Medicine

## 2015-08-20 ENCOUNTER — Ambulatory Visit (INDEPENDENT_AMBULATORY_CARE_PROVIDER_SITE_OTHER): Payer: Medicare Other | Admitting: *Deleted

## 2015-08-20 DIAGNOSIS — E538 Deficiency of other specified B group vitamins: Secondary | ICD-10-CM

## 2015-08-20 MED ORDER — CYANOCOBALAMIN 1000 MCG/ML IJ SOLN
1000.0000 ug | INTRAMUSCULAR | Status: AC
  Administered 2015-08-20 – 2016-01-16 (×6): 1000 ug via INTRAMUSCULAR

## 2015-08-20 NOTE — Patient Instructions (Signed)

## 2015-08-20 NOTE — Progress Notes (Signed)
Pt given B12 injection IM left deltoid and tolerated well. °

## 2015-09-04 ENCOUNTER — Other Ambulatory Visit: Payer: Self-pay | Admitting: Nurse Practitioner

## 2015-09-10 ENCOUNTER — Encounter: Payer: Self-pay | Admitting: Pediatrics

## 2015-09-10 ENCOUNTER — Ambulatory Visit (INDEPENDENT_AMBULATORY_CARE_PROVIDER_SITE_OTHER): Payer: Medicare Other | Admitting: Pediatrics

## 2015-09-10 VITALS — BP 125/68 | HR 80 | Temp 97.5°F | Ht 70.0 in | Wt 231.8 lb

## 2015-09-10 DIAGNOSIS — K219 Gastro-esophageal reflux disease without esophagitis: Secondary | ICD-10-CM | POA: Diagnosis not present

## 2015-09-10 DIAGNOSIS — F411 Generalized anxiety disorder: Secondary | ICD-10-CM

## 2015-09-10 DIAGNOSIS — E1142 Type 2 diabetes mellitus with diabetic polyneuropathy: Secondary | ICD-10-CM

## 2015-09-10 DIAGNOSIS — Z6831 Body mass index (BMI) 31.0-31.9, adult: Secondary | ICD-10-CM | POA: Diagnosis not present

## 2015-09-10 LAB — POCT GLYCOSYLATED HEMOGLOBIN (HGB A1C): Hemoglobin A1C: 6.1

## 2015-09-10 MED ORDER — CITALOPRAM HYDROBROMIDE 20 MG PO TABS: 20.0000 mg | ORAL_TABLET | Freq: Every day | ORAL | Status: DC

## 2015-09-10 MED ORDER — PANTOPRAZOLE SODIUM 40 MG PO TBEC: DELAYED_RELEASE_TABLET | ORAL | Status: DC

## 2015-09-10 NOTE — Patient Instructions (Signed)
Take half a tab of citalopram for 8 days, then take full tab  20-30 minutes of exercise 3-4 times a week

## 2015-09-10 NOTE — Progress Notes (Signed)
Subjective:    Patient ID: [name], female    DOB: 02/17/1956, 60 y.o.   MRN: [id number]  CC: medication check   HPI: [name] is a 60 y.o. female presenting for medication check  Took venlafaxine for two weeks then stopped because having headaches On the days she didn't take it she had a headache  Depression is a little bit better. Recently back from [facility], was a good time with family Is more depressed usually in winter [place]xiety [place] lots of people knocking kids over  Decreased smoking some Getting out more, [place] Between 1/2 to 3/4  Neuropathy: taking gabapentin 600mg  four times a day  GER: takes every day, as long as on it does not have symptoms   Depression screen [facility] 2/9 09/10/2015 07/30/2015 07/02/2015 06/08/2015 04/30/2015  Decreased Interest 0 0 0 0 0  Down, Depressed, Hopeless 0 0 0 0 0  PHQ - 2 Score 0 0 0 0 0  Altered sleeping - - - - -  Tired, decreased energy - - - - -  Change in appetite - - - - -  Feeling bad or failure about yourself  - - - - -  Trouble concentrating - - - - -  Moving slowly or fidgety/restless - - - - -  Suicidal thoughts - - - - -  PHQ-9 Score - - - - -     Relevant past medical, surgical, family and social history reviewed and updated as indicated. Interim medical history since our last visit reviewed. Allergies and medications reviewed and updated.    ROS: Per HPI unless specifically indicated above  History  Smoking status  . Current Every Day Smoker -- 0.50 packs/day for 40 years  . Types: Cigarettes  Smokeless tobacco  . Never Used    Comment: 3/4 pack a day sicne age 14    Past Medical History Patient Active Problem List   Diagnosis Date Noted  . Generalized anxiety disorder 07/30/2015  . OA (osteoarthritis) of knee 01/22/2015  . B12 deficiency anemia 03/20/2014  . BMI 31.0-31.9,adult 03/20/2014  . Gastroesophageal reflux disease without esophagitis 02/21/2014  . Diabetic  neuropathy ([place]) 12/12/2013  . Diabetes ([place]) 01/17/2013  . Multiple sclerosis ([place]) 01/17/2013  . COPD with chronic bronchitis ([place]) 01/17/2013  . Mixed hyperlipidemia 03/30/2012  . Tobacco abuse 03/30/2012    Current Outpatient Prescriptions  Medication Sig Dispense Refill  . albuterol (ACCUNEB) 1.25 MG/3ML nebulizer solution Take 3 mLs (1.25 mg total) by nebulization every 6 (six) hours as needed for wheezing. 75 mL 12  . albuterol (VENTOLIN HFA) 108 (90 BASE) MCG/ACT inhaler Inhale 2 puffs into the lungs every 6 (six) hours as needed. For shortness of breath 1 Inhaler 1  . cyanocobalamin 1000 MCG tablet Inject 100 mcg into the skin every 30 (thirty) days.    . furosemide (LASIX) 20 MG tablet Take 20 mg by mouth. As needed    . gabapentin (NEURONTIN) 600 MG tablet Take 1 tablet (600 mg total) by mouth 4 (four) times daily. 360 tablet 0  . Hypromellose (SYSTANE OVERNIGHT THERAPY) 0.3 % GEL Place 1 drop into both eyes at bedtime as needed.    . loratadine (CLARITIN) 10 MG tablet Take 10 mg by mouth daily.    . metFORMIN (GLUCOPHAGE) 500 MG tablet Take 1 tablet (500 mg total) by mouth daily with breakfast. 90 tablet 0  . pantoprazole (PROTONIX) 40 MG tablet TAKE ONE (1) TABLET EACH  DAY 30 tablet 4  . RESTASIS 0.05 % ophthalmic emulsion   3  . citalopram (CELEXA) 20 MG tablet Take 1 tablet (20 mg total) by mouth daily. 30 tablet 3   Current Facility-Administered Medications  Medication Dose Route Frequency Provider Last Rate Last Dose  . cyanocobalamin ((VITAMIN B-12)) injection 1,000 mcg  1,000 mcg Intramuscular Q30 days [name], MD   1,000 mcg at 08/20/15 1647       Objective:    BP 125/68 mmHg  Pulse 80  Temp(Src) 97.5 F (36.4 C) (Oral)  Ht 5\' 10"  (1.778 m)  Wt 231 lb 12.8 oz (105.144 kg)  BMI 33.26 kg/m2  Wt Readings from Last 3 Encounters:  09/10/15 231 lb 12.8 oz (105.144 kg)  07/30/15 230 lb 3.2 oz (104.418 kg)  07/02/15 223 lb 3.2 oz (101.243 kg)     Gen:  NAD, alert, cooperative with exam, NCAT EYES: EOMI, no scleral injection or icterus ENT:  OP without erythema LYMPH: no cervical LAD CV: NRRR, normal S1/S2, no murmur, distal pulses 2+ b/l Resp: CTABL, no wheezes, normal WOB Abd: +BS, soft, NTND. no guarding or organomegaly Ext: No edema, warm Neuro: Alert and oriented, strength equal b/l UE and LE, coordination grossly normal MSK: normal muscle bulk     Assessment & Plan:    [name] was seen today for multiple med problem f/u.  Diagnoses and all orders for this visit:  Type 2 diabetes mellitus with diabetic polyneuropathy, without long-term current use of insulin (HCC) -     POCT glycosylated hemoglobin (Hb A1C)  Generalized anxiety disorder -     citalopram (CELEXA) 20 MG tablet; Take 1 tablet (20 mg total) by mouth daily.  BMI 31.0-31.9,adult Discussed lifestyle changes, thinking about trying video exercises vs getting recumbent bike. Will schedule with Tammy for nutrition  Gastroesophageal reflux disease without esophagitis -     pantoprazole (PROTONIX) 40 MG tablet; TAKE ONE (1) TABLET EACH DAY    Follow up plan: Return for Sched weight loss appt with Tammy, already has appt with Dr. Evette Doffing 3/20.  Assunta Found, MD [facility] 09/10/2015, 9:55 AM

## 2015-09-19 ENCOUNTER — Ambulatory Visit: Payer: Medicare Other

## 2015-09-20 ENCOUNTER — Ambulatory Visit (INDEPENDENT_AMBULATORY_CARE_PROVIDER_SITE_OTHER): Payer: Medicare Other | Admitting: *Deleted

## 2015-09-20 ENCOUNTER — Encounter: Payer: Self-pay | Admitting: Pharmacist

## 2015-09-20 ENCOUNTER — Ambulatory Visit (INDEPENDENT_AMBULATORY_CARE_PROVIDER_SITE_OTHER): Payer: Medicare Other | Admitting: Pharmacist

## 2015-09-20 VITALS — BP 124/68 | Ht 70.0 in | Wt 234.0 lb

## 2015-09-20 DIAGNOSIS — E1169 Type 2 diabetes mellitus with other specified complication: Secondary | ICD-10-CM | POA: Insufficient documentation

## 2015-09-20 DIAGNOSIS — E669 Obesity, unspecified: Secondary | ICD-10-CM | POA: Diagnosis not present

## 2015-09-20 DIAGNOSIS — E538 Deficiency of other specified B group vitamins: Secondary | ICD-10-CM

## 2015-09-20 DIAGNOSIS — R7303 Prediabetes: Secondary | ICD-10-CM

## 2015-09-20 NOTE — Patient Instructions (Signed)

## 2015-09-20 NOTE — Progress Notes (Signed)
Pt given B12 injection IM right deltoid and tolerated well. 

## 2015-09-20 NOTE — Progress Notes (Signed)
Subjective:     [name] is a 60 y.o. female who I am asked to see in consultation for evaluation and treatment of obesity. She also has pre diabetes. Patient cites health, increased physical ability as reasons for wanting to lose weight.  Obesity History Weight in late teens: 130 lbs. Lowest adult weight: 130 lbs Highest adult weight: 258 lbs   History of Weight Loss Efforts Circumstances associated with regain of weight: knee replacement Successful weight loss techniques attempted: self-directed dieting and Weight Watchers Unsuccessful weight loss techniques attempted: atkins and [employer].   Current Exercise Habits none  Current Eating Habits Number of regular meals per day: 3 Number of snacking episodes per day: 2 Who shops for food? patient Who prepares food? patient Who eats with patient? patient and husband Binge behavior?: no Purge behavior? no Anorexic behavior? no Eating precipitated by stress? yes -   Guilt feelings associated with eating? yes -    Other Potential Contributing Factors Use of alcohol: average 0 drinks/week Use of medications that may cause weight gain none History of past abuse? none Psych History: depression and obesity Comorbidities: pre diabetes The following portions of the patient's history were reviewed and updated as appropriate: allergies, current medications, past family history, past medical history, past social history, past surgical history and problem list.     Objective:    BP 124/68 mmHg  Ht 5\' 10"  (1.778 m)  Wt 234 lb (106.142 kg)  BMI 33.58 kg/m2 Body mass index is 33.58 kg/(m^2).    Assessment:    Obesity with BMI and comorbidities as noted above. Patient readiness to commit to diet and activity changes: good Barriers to weight loss: none     Plan:    1.  Diet interventions: moderate (500 kCal/d) deficit diet Proper food choices reviewed: yes Preparation techniques reviewed: yes Careful meal  planning; avoiding ad hoc eating: yes Stimulus control to control unhealthy eating: yes Handouts given: recommended serving sizes of various foods and handout given with low calorie meal and snack ideas.  2. Exercise intervention:  Informal measures, e.g. taking stairs instead of elevator: yes Formal exercise regimen: yes - patient to try to get recumbent bike 3. Other behavioral treatment: stress management - patient just started antidepressant 1 week ago which can help with stress also 4. Patient to keep a diet and exercise log that we will review at follow up.  Also recommended myfitnesspal app to help track calories 8. Follow up: 4 weeks and as needed.    Cherre Robins, PharmD, CPP

## 2015-10-19 ENCOUNTER — Ambulatory Visit (INDEPENDENT_AMBULATORY_CARE_PROVIDER_SITE_OTHER): Payer: Medicare Other | Admitting: *Deleted

## 2015-10-19 ENCOUNTER — Encounter: Payer: Self-pay | Admitting: Pharmacist

## 2015-10-19 ENCOUNTER — Ambulatory Visit (INDEPENDENT_AMBULATORY_CARE_PROVIDER_SITE_OTHER): Payer: Medicare Other | Admitting: Pharmacist

## 2015-10-19 VITALS — BP 120/68 | HR 78 | Ht 70.0 in | Wt 226.8 lb

## 2015-10-19 DIAGNOSIS — E669 Obesity, unspecified: Secondary | ICD-10-CM

## 2015-10-19 DIAGNOSIS — R7303 Prediabetes: Secondary | ICD-10-CM | POA: Diagnosis not present

## 2015-10-19 DIAGNOSIS — E538 Deficiency of other specified B group vitamins: Secondary | ICD-10-CM

## 2015-10-19 NOTE — Patient Instructions (Addendum)
Continue with diet changes - you are doing great.  Continue to record foods - goal calories of 1500 per day.  Continue to exercise - increase as able to goal of 150 minute per week.   BMI has decreased from 33.6 to 32.54

## 2015-10-19 NOTE — Patient Instructions (Signed)

## 2015-10-19 NOTE — Progress Notes (Signed)
Patient ID: [name], female   DOB: [birth year], 58 y.o.   MRN: [id number] Subjective:     [name] is a 60 y.o. female who is here for reevaluation and treatment of obesity. She also has pre diabetes. Patient cites health, increased physical ability as reasons for wanting to lose weight.  Obesity History Weight in late teens: 130 lbs. Lowest adult weight: 130 lbs Highest adult weight: 258 lbs   History of Weight Loss Efforts Circumstances associated with regain of weight: knee replacement Successful weight loss techniques attempted: self-directed dieting and Weight Watchers Unsuccessful weight loss techniques attempted: atkins and [employer].   Current Exercise Habits yard work and recumbant bike - about 10 to 15 minutes 2-3 days per week  Current Eating Habits Number of regular meals per day: 3 - patient has been logging meal with My Fitness Pal app and is doing well.   Daily caloric goal is 1500kcal is less. Number of snacking episodes per day: 2 Who shops for food? patient Who prepares food? patient Who eats with patient? patient and husband Binge behavior?: no Purge behavior? no Anorexic behavior? no Eating precipitated by stress? yes -   Guilt feelings associated with eating? yes -    Other Potential Contributing Factors Use of alcohol: average 0 drinks/week Use of medications that may cause weight gain none History of past abuse? none Psych History: depression and obesity Comorbidities: pre diabetes The following portions of the patient's history were reviewed and updated as appropriate: allergies, current medications, past family history, past medical history, past social history, past surgical history and problem list.     Objective:    BP 120/68 mmHg  Pulse 78  Ht 5\' 10"  (1.778 m)  Wt 226 lb 12 oz (102.853 kg)  BMI 32.54 kg/m2 Body mass index is 32.54 kg/(m^2).    Assessment:    Obesity with BMI and comorbidities as noted above - has lost  7# since last visit 6 weeks ago! Patient readiness to commit to diet and activity changes: excellent Barriers to weight loss: none     Plan:    1.  Diet interventions: moderate (500 kCal/d) deficit diet  Proper food choices reviewed: yes Continue daily calorie goal of 1500 kcal per day Preparation techniques reviewed: yes Careful meal planning; avoiding ad hoc eating: yes Stimulus control to control unhealthy eating: yes  2. Exercise intervention:  Informal measures, e.g. taking stairs instead of elevator: yes Formal exercise regimen: yes - try to increase amount of time on recumbant bike to 20 minutes.  Goal is 150 minutes per week. 3. Continue to keep a diet and exercise log with myfitnesspal app  4.  Discussed getting mammogram - patient declined because past mammogram failed to detect lump that she could feel - turned out to be fibro fatty tissue.   5. Follow up: sees PCP in 10 days.  Will weight patient when comes in for monthly B12 injections and she will follow up with me June 2017.  Cherre Robins, PharmD, CPP

## 2015-10-19 NOTE — Progress Notes (Signed)
Vitamin b12 injection given and patient tolerated well.  

## 2015-10-27 ENCOUNTER — Encounter: Payer: Self-pay | Admitting: Family Medicine

## 2015-10-27 ENCOUNTER — Ambulatory Visit (INDEPENDENT_AMBULATORY_CARE_PROVIDER_SITE_OTHER): Payer: Medicare Other | Admitting: Family Medicine

## 2015-10-27 VITALS — BP 107/64 | HR 67 | Temp 97.2°F | Ht 70.0 in | Wt 224.0 lb

## 2015-10-27 DIAGNOSIS — J302 Other seasonal allergic rhinitis: Secondary | ICD-10-CM

## 2015-10-27 MED ORDER — FEXOFENADINE HCL 180 MG PO TABS: 180.0000 mg | ORAL_TABLET | Freq: Every day | ORAL | Status: DC

## 2015-10-27 MED ORDER — PSEUDOEPHEDRINE-GUAIFENESIN ER 120-1200 MG PO TB12: 1.0000 | ORAL_TABLET | Freq: Two times a day (BID) | ORAL | Status: DC

## 2015-10-27 MED ORDER — METHYLPREDNISOLONE ACETATE 80 MG/ML IJ SUSP
80.0000 mg | Freq: Once | INTRAMUSCULAR | Status: AC
  Administered 2015-10-27: 80 mg via INTRAMUSCULAR

## 2015-10-27 NOTE — Progress Notes (Signed)
Subjective:  Patient ID: [name], female    DOB: December 08, 1955  Age: 60 y.o. MRN: [id number]  CC: Nasal Congestion   HPI [name] presents for 1 day of congestion. No fever, chills. No cough or dyspnea. Some sneezing. Denies myalgia. No change in appetite.   History [name] has a past medical history of Multiple sclerosis ([name]); GERD (gastroesophageal reflux disease); COPD (chronic obstructive pulmonary disease) ([name]); Mixed hyperlipidemia; PONV (postoperative nausea and vomiting); Weakness of left side of body; Arthritis; Psoriasis; Neuropathy ([name]); Neuromuscular disorder ([name]); History of skin cancer; Anxiety; Cancer ([name]); and Pre-diabetes.   She has past surgical history that includes Right total knee replacement (2010); Left breast lumpectomy; Left carpal tunnel release; Abdominal hysterectomy; Sinus cyst removal; Cholecystectomy; Wrist surgery; Shoulder surgery; Knee arthroscopy; Total knee revision (05/05/2012); Partial knee arthroplasty (Left, 01/22/2015); and Colonoscopy (N/A, 08/02/2015).   Her family history includes Atrial fibrillation in her father; Dementia in her mother.She reports that she has been smoking Cigarettes.  She has a 20 pack-year smoking history. She has never used smokeless tobacco. She reports that she does not drink alcohol or use illicit drugs.    ROS Review of Systems  Constitutional: Negative for fever, chills, diaphoresis, appetite change and fatigue.  HENT: Positive for postnasal drip, rhinorrhea and sore throat. Negative for congestion, ear pain, hearing loss and trouble swallowing.   Respiratory: Negative for cough, chest tightness and shortness of breath.   Cardiovascular: Negative for chest pain and palpitations.  Gastrointestinal: Negative for abdominal pain.  Skin: Negative for rash.    Objective:  BP 107/64 mmHg  Pulse 67  Temp(Src) 97.2 F (36.2 C) (Oral)  Ht 5\' 10"  (1.778 m)  Wt 224 lb (101.606 kg)  BMI 32.14 kg/m2  BP  Readings from Last 3 Encounters:  10/27/15 107/64  10/19/15 120/68  09/20/15 124/68    Wt Readings from Last 3 Encounters:  10/27/15 224 lb (101.606 kg)  10/19/15 226 lb 12 oz (102.853 kg)  09/20/15 234 lb (106.142 kg)     Physical Exam  Constitutional: She appears well-developed and well-nourished.  HENT:  Head: Normocephalic and atraumatic.  Right Ear: Tympanic membrane and external ear normal. No decreased hearing is noted.  Left Ear: Tympanic membrane and external ear normal. No decreased hearing is noted.  Nose: Mucosal edema present. Right sinus exhibits no frontal sinus tenderness. Left sinus exhibits no frontal sinus tenderness.  Mouth/Throat: No oropharyngeal exudate or posterior oropharyngeal erythema.  Neck: No Brudzinski's sign noted.  Pulmonary/Chest: Breath sounds normal. No respiratory distress.  Lymphadenopathy:       Head (right side): No preauricular adenopathy present.       Head (left side): No preauricular adenopathy present.       Right cervical: No superficial cervical adenopathy present.      Left cervical: No superficial cervical adenopathy present.     Lab Results  Component Value Date   WBC 8.3 06/08/2015   HGB 12.4 01/23/2015   HCT 39.7 06/08/2015   PLT 286 06/08/2015   GLUCOSE 96 06/08/2015   CHOL 223* 04/30/2015   TRIG 157* 04/30/2015   HDL 42 04/30/2015   LDLCALC 150* 04/30/2015   ALT 9 06/08/2015   AST 13 06/08/2015   NA 142 06/08/2015   K 4.8 06/08/2015   CL 100 06/08/2015   CREATININE 0.89 06/08/2015   BUN 10 06/08/2015   CO2 27 06/08/2015   TSH 3.290 02/21/2014   INR 1.11 01/12/2015   HGBA1C 6.1 09/10/2015  No results found.  Assessment & Plan:   Nayelly was seen today for nasal congestion.  Diagnoses and all orders for this visit:  Other seasonal allergic rhinitis -     methylPREDNISolone acetate (DEPO-MEDROL) injection 80 mg; Inject 1 mL (80 mg total) into the muscle once.  Other orders -      Pseudoephedrine-Guaifenesin [phone] MG TB12; Take 1 tablet by mouth 2 (two) times daily. For congestion -     fexofenadine (ALLEGRA) 180 MG tablet; Take 1 tablet (180 mg total) by mouth daily. For allergy symptoms      I am having Ms. Ferner start on Pseudoephedrine-Guaifenesin and fexofenadine. I am also having her maintain her albuterol, albuterol, furosemide, gabapentin, cyanocobalamin, loratadine, citalopram, pantoprazole, carboxymethylcellulose, and Melatonin. We will continue to administer cyanocobalamin and methylPREDNISolone acetate.  Meds ordered this encounter  Medications  . Pseudoephedrine-Guaifenesin [phone] MG TB12    Sig: Take 1 tablet by mouth 2 (two) times daily. For congestion    Dispense:  20 each    Refill:  0  . fexofenadine (ALLEGRA) 180 MG tablet    Sig: Take 1 tablet (180 mg total) by mouth daily. For allergy symptoms    Dispense:  30 tablet    Refill:  11  . methylPREDNISolone acetate (DEPO-MEDROL) injection 80 mg    Sig:      Follow-up: Return if symptoms worsen or fail to improve.  [name], M.D.

## 2015-10-29 ENCOUNTER — Encounter: Payer: Self-pay | Admitting: Pediatrics

## 2015-10-29 ENCOUNTER — Ambulatory Visit (INDEPENDENT_AMBULATORY_CARE_PROVIDER_SITE_OTHER): Payer: Medicare Other | Admitting: Pediatrics

## 2015-10-29 VITALS — BP 129/82 | HR 66 | Temp 97.0°F | Ht 70.0 in | Wt 222.6 lb

## 2015-10-29 DIAGNOSIS — F411 Generalized anxiety disorder: Secondary | ICD-10-CM | POA: Diagnosis not present

## 2015-10-29 DIAGNOSIS — G629 Polyneuropathy, unspecified: Secondary | ICD-10-CM

## 2015-10-29 DIAGNOSIS — M7989 Other specified soft tissue disorders: Secondary | ICD-10-CM

## 2015-10-29 DIAGNOSIS — K219 Gastro-esophageal reflux disease without esophagitis: Secondary | ICD-10-CM

## 2015-10-29 MED ORDER — GABAPENTIN 600 MG PO TABS: 600.0000 mg | ORAL_TABLET | Freq: Four times a day (QID) | ORAL | Status: DC

## 2015-10-29 MED ORDER — FUROSEMIDE 20 MG PO TABS: 20.0000 mg | ORAL_TABLET | Freq: Every day | ORAL | Status: DC

## 2015-10-29 MED ORDER — PANTOPRAZOLE SODIUM 40 MG PO TBEC: DELAYED_RELEASE_TABLET | ORAL | Status: DC

## 2015-10-29 MED ORDER — CITALOPRAM HYDROBROMIDE 20 MG PO TABS: 20.0000 mg | ORAL_TABLET | Freq: Every day | ORAL | Status: DC

## 2015-10-29 NOTE — Progress Notes (Signed)
Subjective:    Patient ID: [name], female    DOB: [birth year], 60 y.o.   MRN: [id number]  CC: Follow-up multiple med problems  HPI: [name] is a 60 y.o. female presenting for Follow-up  Cut out soft drinks and breads Cutting out sweets Exercise now recumbent bike 12-15 min a couple times a day Walking more  Thinks the celexa is helping Feels pretty good  Neuropathy: improving, thinks celexa might be helping some  Smoking abou tthe same, about 1/2 pack a day  Feel sbetter, especially  Depression screen [facility] 2/9 10/29/2015 09/10/2015 07/30/2015 07/02/2015 06/08/2015  Decreased Interest 0 0 0 0 0  Down, Depressed, Hopeless 0 0 0 0 0  PHQ - 2 Score 0 0 0 0 0  Altered sleeping - - - - -  Tired, decreased energy - - - - -  Change in appetite - - - - -  Feeling bad or failure about yourself  - - - - -  Trouble concentrating - - - - -  Moving slowly or fidgety/restless - - - - -  Suicidal thoughts - - - - -  PHQ-9 Score - - - - -     Relevant past medical, surgical, family and social history reviewed and updated as indicated. Interim medical history since our last visit reviewed. Allergies and medications reviewed and updated.    ROS: Per HPI unless specifically indicated above  History  Smoking status  . Current Every Day Smoker -- 0.50 packs/day for 40 years  . Types: Cigarettes  Smokeless tobacco  . Never Used    Comment: 3/4 pack a day sicne age 20    Past Medical History Patient Active Problem List   Diagnosis Date Noted  . Pre-diabetes 09/20/2015  . Generalized anxiety disorder 07/30/2015  . OA (osteoarthritis) of knee 01/22/2015  . B12 deficiency anemia 03/20/2014  . BMI 31.0-31.9,adult 03/20/2014  . Gastroesophageal reflux disease without esophagitis 02/21/2014  . Multiple sclerosis ([place]) 01/17/2013  . COPD with chronic bronchitis ([place]) 01/17/2013  . Mixed hyperlipidemia 03/30/2012  . Tobacco abuse 03/30/2012        Objective:      BP 129/82 mmHg  Pulse 66  Temp(Src) 97 F (36.1 C) (Oral)  Ht 5\' 10"  (1.778 m)  Wt 222 lb 9.6 oz (100.971 kg)  BMI 31.94 kg/m2  Wt Readings from Last 3 Encounters:  10/29/15 222 lb 9.6 oz (100.971 kg)  10/27/15 224 lb (101.606 kg)  10/19/15 226 lb 12 oz (102.853 kg)     Gen: NAD, alert, cooperative with exam, NCAT, slightly congested EYES: EOMI, no scleral injection or icterus ENT:  TMs pearly gray b/l, OP with mild erythema LYMPH: no cervical LAD CV: NRRR, normal S1/S2, no murmur, distal pulses 2+ b/l Resp: CTABL, no wheezes, normal WOB Ext: trace edema b/l LE, warm Neuro: Alert and oriented, strength equal b/l UE and LE, coordination grossly normal MSK: normal muscle bulk     Assessment & Plan:    Zoeh was seen today for follow-up.  Diagnoses and all orders for this visit:  Generalized anxiety disorder -     citalopram (CELEXA) 20 MG tablet; Take 1 tablet (20 mg total) by mouth daily.  Gastroesophageal reflux disease without esophagitis -     pantoprazole (PROTONIX) 40 MG tablet; TAKE ONE (1) TABLET EACH DAY  Swelling of limb -     furosemide (LASIX) 20 MG tablet; Take 1 tablet (20 mg total) by mouth daily.  As needed  Neuropathy (HCC) -     gabapentin (NEURONTIN) 600 MG tablet; Take 1 tablet (600 mg total) by mouth 4 (four) times daily.    Follow up plan: As needed  Assunta Found, MD [facility] 10/29/2015, 10:37 AM

## 2015-11-19 ENCOUNTER — Ambulatory Visit (INDEPENDENT_AMBULATORY_CARE_PROVIDER_SITE_OTHER): Payer: Medicare Other | Admitting: *Deleted

## 2015-11-19 VITALS — Wt 220.1 lb

## 2015-11-19 DIAGNOSIS — E538 Deficiency of other specified B group vitamins: Secondary | ICD-10-CM | POA: Diagnosis not present

## 2015-11-19 NOTE — Progress Notes (Signed)
Pt given B12 injection IM right deltoid and tolerated well. 

## 2015-12-13 ENCOUNTER — Encounter: Payer: Self-pay | Admitting: *Deleted

## 2015-12-19 ENCOUNTER — Ambulatory Visit (INDEPENDENT_AMBULATORY_CARE_PROVIDER_SITE_OTHER): Payer: Medicare Other

## 2015-12-19 DIAGNOSIS — E538 Deficiency of other specified B group vitamins: Secondary | ICD-10-CM

## 2016-01-16 ENCOUNTER — Ambulatory Visit (INDEPENDENT_AMBULATORY_CARE_PROVIDER_SITE_OTHER): Payer: Medicare Other | Admitting: *Deleted

## 2016-01-16 ENCOUNTER — Ambulatory Visit: Payer: Self-pay | Admitting: Pharmacist

## 2016-01-16 DIAGNOSIS — E538 Deficiency of other specified B group vitamins: Secondary | ICD-10-CM | POA: Diagnosis not present

## 2016-01-16 NOTE — Progress Notes (Signed)
Pt given B12 injection IM right deltoid and tolerated well. Pt signed ABN waiver since it was a few days early.

## 2016-01-16 NOTE — Patient Instructions (Signed)

## 2016-01-18 ENCOUNTER — Ambulatory Visit: Payer: Self-pay | Admitting: Pharmacist

## 2016-01-18 ENCOUNTER — Ambulatory Visit: Payer: Medicare Other

## 2016-02-18 ENCOUNTER — Ambulatory Visit (INDEPENDENT_AMBULATORY_CARE_PROVIDER_SITE_OTHER): Payer: Medicare Other | Admitting: *Deleted

## 2016-02-18 DIAGNOSIS — E538 Deficiency of other specified B group vitamins: Secondary | ICD-10-CM | POA: Diagnosis not present

## 2016-02-18 MED ORDER — CYANOCOBALAMIN 1000 MCG/ML IJ SOLN
1000.0000 ug | INTRAMUSCULAR | Status: AC
  Administered 2016-02-18 – 2017-02-02 (×12): 1000 ug via INTRAMUSCULAR

## 2016-02-18 NOTE — Progress Notes (Signed)
Pt given B12 injection IM left deltoid and tolerated well. °

## 2016-03-03 ENCOUNTER — Telehealth: Payer: Self-pay | Admitting: Pediatrics

## 2016-03-03 ENCOUNTER — Ambulatory Visit (INDEPENDENT_AMBULATORY_CARE_PROVIDER_SITE_OTHER): Payer: Medicare Other | Admitting: Pediatrics

## 2016-03-03 ENCOUNTER — Encounter: Payer: Self-pay | Admitting: Pediatrics

## 2016-03-03 VITALS — BP 95/60 | HR 66 | Temp 99.7°F | Ht 70.0 in | Wt 208.4 lb

## 2016-03-03 DIAGNOSIS — D519 Vitamin B12 deficiency anemia, unspecified: Secondary | ICD-10-CM

## 2016-03-03 DIAGNOSIS — L309 Dermatitis, unspecified: Secondary | ICD-10-CM

## 2016-03-03 DIAGNOSIS — Z6829 Body mass index (BMI) 29.0-29.9, adult: Secondary | ICD-10-CM

## 2016-03-03 DIAGNOSIS — Z720 Tobacco use: Secondary | ICD-10-CM

## 2016-03-03 DIAGNOSIS — J449 Chronic obstructive pulmonary disease, unspecified: Secondary | ICD-10-CM

## 2016-03-03 DIAGNOSIS — Z0000 Encounter for general adult medical examination without abnormal findings: Secondary | ICD-10-CM

## 2016-03-03 DIAGNOSIS — F411 Generalized anxiety disorder: Secondary | ICD-10-CM

## 2016-03-03 DIAGNOSIS — R5383 Other fatigue: Secondary | ICD-10-CM

## 2016-03-03 DIAGNOSIS — J441 Chronic obstructive pulmonary disease with (acute) exacerbation: Secondary | ICD-10-CM

## 2016-03-03 DIAGNOSIS — Z90710 Acquired absence of both cervix and uterus: Secondary | ICD-10-CM

## 2016-03-03 DIAGNOSIS — J4489 Other specified chronic obstructive pulmonary disease: Secondary | ICD-10-CM

## 2016-03-03 DIAGNOSIS — R7303 Prediabetes: Secondary | ICD-10-CM

## 2016-03-03 LAB — BAYER DCA HB A1C WAIVED: HB A1C: 5.9 % (ref <7.0)

## 2016-03-03 MED ORDER — TRIAMCINOLONE ACETONIDE 0.025 % EX OINT: 1.0000 "application " | TOPICAL_OINTMENT | Freq: Two times a day (BID) | CUTANEOUS | 3 refills | Status: DC

## 2016-03-03 MED ORDER — ALBUTEROL SULFATE HFA 108 (90 BASE) MCG/ACT IN AERS: 2.0000 | INHALATION_SPRAY | Freq: Four times a day (QID) | RESPIRATORY_TRACT | 1 refills | Status: DC | PRN

## 2016-03-03 NOTE — Telephone Encounter (Signed)
Patient states that she thought we were going to send in a cream for eczema and something for her inhaler.

## 2016-03-03 NOTE — Addendum Note (Signed)
Addended by: [name] on: 03/03/2016 05:23 PM   Modules accepted: Orders

## 2016-03-03 NOTE — Progress Notes (Signed)
Subjective:    Patient ID: [name], female    DOB: 07-06-1956, 60 y.o.   MRN: [id number]  CC: Annual Exam (Pap)   HPI: [name] is a 60 y.o. female presenting for Annual Exam (Pap)  Feeling well  Pre-diabetes: participating in pre-diabetes program with pharmacist here Has been very motivating  Cervical ca screen: doesn't remember last, h/o hysterectomy  Mood is up and down, mostly good days Husband is depressed and not interested in getting help Not sexually active anymore  Continues to smoke Wants to work on smoking after her weight is better with the program  Going on cruise with husband for 40th wedding anniversary  Depression screen [facility] 2/9 03/03/2016 10/29/2015 09/10/2015 07/30/2015 07/02/2015  Decreased Interest 0 0 0 0 0  Down, Depressed, Hopeless 0 0 0 0 0  PHQ - 2 Score 0 0 0 0 0  Altered sleeping - - - - -  Tired, decreased energy - - - - -  Change in appetite - - - - -  Feeling bad or failure about yourself  - - - - -  Trouble concentrating - - - - -  Moving slowly or fidgety/restless - - - - -  Suicidal thoughts - - - - -  PHQ-9 Score - - - - -     Relevant past medical, surgical, family and social history reviewed and updated as indicated.  Interim medical history since our last visit reviewed. Allergies and medications reviewed and updated.  ROS: All systems negative other than what is in HPI  History  Smoking Status  . Current Every Day Smoker  . Packs/day: 0.50  . Years: 40.00  . Types: Cigarettes  Smokeless Tobacco  . Never Used    Comment: 3/4 pack a day sicne age 52       Objective:    BP 95/60 (BP Location: Left Arm, Patient Position: Sitting, Cuff Size: Large)   Pulse 66   Temp 99.7 F (37.6 C) (Oral)   Ht 5' 10"  (1.778 m)   Wt 208 lb 6.4 oz (94.5 kg)   BMI 29.90 kg/m   Wt Readings from Last 3 Encounters:  03/03/16 208 lb 6.4 oz (94.5 kg)  11/19/15 220 lb 2 oz (99.8 kg)  10/29/15 222 lb 9.6 oz (101 kg)      Gen: NAD, alert, cooperative with exam, NCAT EYES: EOMI, no scleral injection or icterus ENT:  OP without erythema LYMPH: no cervical LAD CV: NRRR, normal S1/S2, no murmur, distal pulses 2+ b/l Resp: CTABL, no wheezes, normal WOB Abd: +BS, soft, NTND. no guarding or organomegaly Ext: No edema, warm Neuro: Alert and oriented, strength equal b/l UE and LE, coordination grossly normal MSK: normal muscle bulk Breast: normal breast exam b/l     Assessment & Plan:    [name] was seen today for annual exam.  Diagnoses and all orders for this visit:  Annual physical exam -     Cancel: Urinalysis, Complete -     Bayer DCA Hb A1c Waived -     BMP8+EGFR -     CBC with Differential/Platelet -     Vitamin B12 -     VITAMIN D 25 Hydroxy (Vit-D Deficiency, Fractures) -     TSH -     MM Digital Screening; Future -     Hepatitis C antibody -     Lipid panel  B12 deficiency anemia Check labs today Getting B12 shots regularly  BMI  29.0-29.9,adult 26 lb loss over past 6 months Continue class, lifestyle changes  Tobacco abuse Pre-contemplative Interested but not ready to tackle yet Wants to lose more weight first  Pre-diabetes Continue lifestyle changes  Other fatigue Likely multifactorial Continue lexapro for depression Will check labs  COPD with chronic bronchitis (HCC) Continue albuterol as needed, needing rarely  S/p hysterectomy  will stop regular pap smears  H/o AK, planning to follow up with dermatology  Follow up plan: Return in about 6 months (around 09/03/2016).  Assunta Found, MD [facility] 03/03/2016, 11:20 AM

## 2016-03-04 LAB — CBC WITH DIFFERENTIAL/PLATELET
BASOS ABS: 0.1 10*3/uL (ref 0.0–0.2)
Basos: 1 %
EOS (ABSOLUTE): 0.1 10*3/uL (ref 0.0–0.4)
Eos: 2 %
HEMOGLOBIN: 13.3 g/dL (ref 11.1–15.9)
Hematocrit: 40.8 % (ref 34.0–46.6)
IMMATURE GRANS (ABS): 0 10*3/uL (ref 0.0–0.1)
IMMATURE GRANULOCYTES: 0 %
LYMPHS: 32 %
Lymphocytes Absolute: 2.4 10*3/uL (ref 0.7–3.1)
MCH: 28.2 pg (ref 26.6–33.0)
MCHC: 32.6 g/dL (ref 31.5–35.7)
MCV: 86 fL (ref 79–97)
MONOCYTES: 4 %
Monocytes Absolute: 0.3 10*3/uL (ref 0.1–0.9)
NEUTROS ABS: 4.7 10*3/uL (ref 1.4–7.0)
NEUTROS PCT: 61 %
Platelets: 236 10*3/uL (ref 150–379)
RBC: 4.72 x10E6/uL (ref 3.77–5.28)
RDW: 14.2 % (ref 12.3–15.4)
WBC: 7.6 10*3/uL (ref 3.4–10.8)

## 2016-03-04 LAB — BMP8+EGFR
BUN/Creatinine Ratio: 11 (ref 9–23)
BUN: 10 mg/dL (ref 6–24)
CALCIUM: 9.1 mg/dL (ref 8.7–10.2)
CHLORIDE: 102 mmol/L (ref 96–106)
CO2: 27 mmol/L (ref 18–29)
Creatinine, Ser: 0.9 mg/dL (ref 0.57–1.00)
GFR calc non Af Amer: 71 mL/min/{1.73_m2} (ref >59)
GFR, EST AFRICAN AMERICAN: 82 mL/min/{1.73_m2} (ref >59)
Glucose: 94 mg/dL (ref 65–99)
Potassium: 4.8 mmol/L (ref 3.5–5.2)
Sodium: 142 mmol/L (ref 134–144)

## 2016-03-04 LAB — LIPID PANEL
CHOLESTEROL TOTAL: 223 mg/dL — AB (ref 100–199)
Chol/HDL Ratio: 6.2 ratio units — ABNORMAL HIGH (ref 0.0–4.4)
HDL: 36 mg/dL — ABNORMAL LOW (ref >39)
LDL Calculated: 158 mg/dL — ABNORMAL HIGH (ref 0–99)
Triglycerides: 146 mg/dL (ref 0–149)
VLDL Cholesterol Cal: 29 mg/dL (ref 5–40)

## 2016-03-04 LAB — VITAMIN D 25 HYDROXY (VIT D DEFICIENCY, FRACTURES): VIT D 25 HYDROXY: 22 ng/mL — AB (ref 30.0–100.0)

## 2016-03-04 LAB — HEPATITIS C ANTIBODY: HEP C VIRUS AB: 0.1 {s_co_ratio} (ref 0.0–0.9)

## 2016-03-04 LAB — VITAMIN B12: VITAMIN B 12: 611 pg/mL (ref 211–946)

## 2016-03-04 LAB — TSH: TSH: 3.31 u[IU]/mL (ref 0.450–4.500)

## 2016-03-04 NOTE — Telephone Encounter (Signed)
Patient aware.

## 2016-03-10 ENCOUNTER — Other Ambulatory Visit: Payer: Self-pay | Admitting: Pediatrics

## 2016-03-10 DIAGNOSIS — E559 Vitamin D deficiency, unspecified: Secondary | ICD-10-CM

## 2016-03-10 MED ORDER — VITAMIN D (ERGOCALCIFEROL) 1.25 MG (50000 UNIT) PO CAPS: 50000.0000 [IU] | ORAL_CAPSULE | ORAL | 0 refills | Status: DC

## 2016-03-17 ENCOUNTER — Telehealth: Payer: Self-pay | Admitting: Pediatrics

## 2016-03-17 NOTE — Telephone Encounter (Signed)
Mamm cancelled

## 2016-03-19 ENCOUNTER — Ambulatory Visit: Payer: Medicare Other

## 2016-03-24 ENCOUNTER — Ambulatory Visit (INDEPENDENT_AMBULATORY_CARE_PROVIDER_SITE_OTHER): Payer: Medicare Other | Admitting: *Deleted

## 2016-03-24 DIAGNOSIS — E538 Deficiency of other specified B group vitamins: Secondary | ICD-10-CM

## 2016-03-24 DIAGNOSIS — D519 Vitamin B12 deficiency anemia, unspecified: Secondary | ICD-10-CM

## 2016-03-24 NOTE — Progress Notes (Signed)
Pt given B12 injection IM right deltoid and tolerated well. 

## 2016-04-07 ENCOUNTER — Other Ambulatory Visit: Payer: Self-pay | Admitting: Pediatrics

## 2016-04-07 DIAGNOSIS — E559 Vitamin D deficiency, unspecified: Secondary | ICD-10-CM

## 2016-04-17 ENCOUNTER — Other Ambulatory Visit: Payer: Self-pay | Admitting: Pediatrics

## 2016-04-17 DIAGNOSIS — F411 Generalized anxiety disorder: Secondary | ICD-10-CM

## 2016-04-18 ENCOUNTER — Ambulatory Visit: Payer: Medicare Other

## 2016-04-18 ENCOUNTER — Ambulatory Visit
Admission: RE | Admit: 2016-04-18 | Discharge: 2016-04-18 | Disposition: A | Discharge status: Home / Self Care | Payer: Medicare Other | Source: Ambulatory Visit | Attending: Pediatrics | Admitting: Pediatrics

## 2016-04-18 DIAGNOSIS — Z0000 Encounter for general adult medical examination without abnormal findings: Secondary | ICD-10-CM

## 2016-04-25 ENCOUNTER — Ambulatory Visit (INDEPENDENT_AMBULATORY_CARE_PROVIDER_SITE_OTHER): Payer: Medicare Other | Admitting: *Deleted

## 2016-04-25 DIAGNOSIS — E538 Deficiency of other specified B group vitamins: Secondary | ICD-10-CM

## 2016-04-25 DIAGNOSIS — D519 Vitamin B12 deficiency anemia, unspecified: Secondary | ICD-10-CM

## 2016-04-25 NOTE — Progress Notes (Signed)
Pt given Vit B12 1000mcg Tolerated well 

## 2016-04-29 ENCOUNTER — Other Ambulatory Visit: Payer: Self-pay | Admitting: Pediatrics

## 2016-04-29 DIAGNOSIS — G629 Polyneuropathy, unspecified: Secondary | ICD-10-CM

## 2016-05-12 ENCOUNTER — Encounter: Payer: Self-pay | Admitting: Family

## 2016-05-12 ENCOUNTER — Ambulatory Visit (INDEPENDENT_AMBULATORY_CARE_PROVIDER_SITE_OTHER): Payer: Medicare Other | Admitting: Family

## 2016-05-12 VITALS — BP 121/60 | HR 57 | Temp 98.0°F | Ht 70.0 in | Wt 211.2 lb

## 2016-05-12 DIAGNOSIS — S61213A Laceration without foreign body of left middle finger without damage to nail, initial encounter: Secondary | ICD-10-CM

## 2016-05-12 NOTE — Patient Instructions (Signed)

## 2016-05-12 NOTE — Progress Notes (Signed)
   Subjective:    Patient ID: [name], female    DOB: 13-May-1956, 60 y.o.   MRN: [id number]  Laceration   The incident occurred 12 to 24 hours ago. The laceration is located on the left hand. The laceration is 2 cm in size. The laceration mechanism was a metal edge. The pain is at a severity of 3/10. The pain is mild. The pain has been intermittent since onset. She reports no foreign bodies present. Her tetanus status is UTD.      Review of Systems  Skin: Positive for wound.  All other systems reviewed and are negative.      Objective:   Physical Exam  Constitutional: She is oriented to person, place, and time. She appears well-developed and well-nourished.  Cardiovascular: Normal rate, regular rhythm, normal heart sounds and intact distal pulses.   Pulmonary/Chest: Effort normal and breath sounds normal.  Musculoskeletal: Normal range of motion.  Neurological: She is alert and oriented to person, place, and time.  Skin: Laceration (left laceration to left middle finger, no active bleeding, skin well approximated) noted.    Area clean and skin gluce applied. Pt tolerated well.   BP 121/60   Pulse (!) 57   Temp 98 F (36.7 C) (Oral)   Ht 5\' 10"  (1.778 m)   Wt 211 lb 3.2 oz (95.8 kg)   BMI 30.30 kg/m      Assessment & Plan:  1. Laceration of left middle finger without foreign body without damage to nail, initial encounter -Keep clean and dry -Do not pick at -If any redness, warmth, discharge [place], [place], FNP

## 2016-05-22 ENCOUNTER — Other Ambulatory Visit: Payer: Self-pay | Admitting: Pharmacist

## 2016-05-22 DIAGNOSIS — R7303 Prediabetes: Secondary | ICD-10-CM

## 2016-05-22 MED ORDER — PHENTERMINE HCL 37.5 MG PO TABS
ORAL_TABLET | ORAL | 0 refills | Status: DC
Start: 2016-05-22 — End: 2016-06-10

## 2016-05-22 NOTE — Telephone Encounter (Signed)
Patient with pre diabetes - has lost about 23 lbs over the last 6 months but has plateaued.  Last BP was WNl and HR WNL.  Rx called to [facility] for phentermin 37.5mg  1/2 to 1 tablet qam #30 no RF

## 2016-05-26 ENCOUNTER — Ambulatory Visit (INDEPENDENT_AMBULATORY_CARE_PROVIDER_SITE_OTHER): Payer: Medicare Other | Admitting: *Deleted

## 2016-05-26 DIAGNOSIS — E538 Deficiency of other specified B group vitamins: Secondary | ICD-10-CM

## 2016-05-26 DIAGNOSIS — D518 Other vitamin B12 deficiency anemias: Secondary | ICD-10-CM

## 2016-05-26 NOTE — Progress Notes (Signed)
Pt given Vit B12 inj Tolerated well 

## 2016-05-30 ENCOUNTER — Other Ambulatory Visit: Payer: Self-pay | Admitting: Pediatrics

## 2016-05-30 DIAGNOSIS — G629 Polyneuropathy, unspecified: Secondary | ICD-10-CM

## 2016-06-10 ENCOUNTER — Encounter: Payer: Self-pay | Admitting: Family Medicine

## 2016-06-10 ENCOUNTER — Ambulatory Visit (INDEPENDENT_AMBULATORY_CARE_PROVIDER_SITE_OTHER): Payer: Medicare Other | Admitting: Family Medicine

## 2016-06-10 VITALS — BP 111/57 | HR 65 | Temp 98.3°F | Ht 70.0 in | Wt 210.0 lb

## 2016-06-10 DIAGNOSIS — J449 Chronic obstructive pulmonary disease, unspecified: Secondary | ICD-10-CM

## 2016-06-10 MED ORDER — PREDNISONE 20 MG PO TABS: ORAL_TABLET | ORAL | 0 refills | Status: DC

## 2016-06-10 MED ORDER — AZITHROMYCIN 250 MG PO TABS: ORAL_TABLET | ORAL | 0 refills | Status: DC

## 2016-06-10 NOTE — Progress Notes (Signed)
   Subjective:    Patient ID: [name], female    DOB: [birth year], 58 y.o.   MRN: [id number]  HPI patient with COPD. Cough started this a.m. she has used a nebulizer and inhaler for breathing. She has not had fever but has had a productive cough. She has exacerbations of her COPD by history. She is not on medications chronically. She also has a history of MS.  Patient Active Problem List   Diagnosis Date Noted  . S/P hysterectomy 03/03/2016  . Pre-diabetes 09/20/2015  . Generalized anxiety disorder 07/30/2015  . OA (osteoarthritis) of knee 01/22/2015  . B12 deficiency anemia 03/20/2014  . BMI 29.0-29.9,adult 03/20/2014  . Gastroesophageal reflux disease without esophagitis 02/21/2014  . Multiple sclerosis ([place]) 01/17/2013  . COPD with chronic bronchitis ([place]) 01/17/2013  . Mixed hyperlipidemia 03/30/2012  . Tobacco abuse 03/30/2012   Outpatient Encounter Prescriptions as of 06/10/2016  Medication Sig  . albuterol (ACCUNEB) 1.25 MG/3ML nebulizer solution Take 3 mLs (1.25 mg total) by nebulization every 6 (six) hours as needed for wheezing.  Marland Kitchen albuterol (VENTOLIN HFA) 108 (90 Base) MCG/ACT inhaler Inhale 2 puffs into the lungs every 6 (six) hours as needed. For shortness of breath  . citalopram (CELEXA) 20 MG tablet TAKE ONE (1) TABLET EACH DAY  . furosemide (LASIX) 20 MG tablet Take 1 tablet (20 mg total) by mouth daily. As needed  . gabapentin (NEURONTIN) 600 MG tablet TAKE ONE TABLET FOUR TIMES DAILY  . Melatonin 10 MG TABS Take 1 tablet by mouth at bedtime.  . pantoprazole (PROTONIX) 40 MG tablet TAKE ONE (1) TABLET EACH DAY  . triamcinolone (KENALOG) 0.025 % ointment Apply 1 application topically 2 (two) times daily.  Marland Kitchen XIIDRA 5 % SOLN   . [DISCONTINUED] carboxymethylcellulose (RETAINE CMC) 0.5 % SOLN 1 drop 3 (three) times daily as needed.  . [DISCONTINUED] phentermine (ADIPEX-P) 37.5 MG tablet Take 1/2 to 1 tablet qam   Facility-Administered Encounter Medications as of  06/10/2016  Medication  . cyanocobalamin ((VITAMIN B-12)) injection 1,000 mcg      Review of Systems  Constitutional: Negative.   Respiratory: Positive for cough and shortness of breath.   Neurological: Negative.   Psychiatric/Behavioral: Negative.        Objective:   Physical Exam  Constitutional: She is oriented to person, place, and time. She appears well-developed and well-nourished.  Cardiovascular: Normal rate and regular rhythm.   Pulmonary/Chest: Effort normal. No respiratory distress. She has wheezes.  Neurological: She is alert and oriented to person, place, and time.  Psychiatric: She has a normal mood and affect.   BP (!) 111/57   Pulse 65   Temp 98.3 F (36.8 C) (Oral)   Ht 5\' 10"  (1.778 m)   Wt 210 lb (95.3 kg)   BMI 30.13 kg/m         Assessment & Plan:  1. COPD with chronic bronchitis (HCC) Acute exacerbation. Will treat with prednisone 40 mg and taper over 8 days. Also a Zithromax for 5 days treatment 20 of fluids use nebulizers and bronchodilators as needed Wardell Honour MD

## 2016-06-13 ENCOUNTER — Other Ambulatory Visit: Payer: Self-pay | Admitting: Pediatrics

## 2016-06-13 DIAGNOSIS — F411 Generalized anxiety disorder: Secondary | ICD-10-CM

## 2016-06-14 ENCOUNTER — Inpatient Hospital Stay (HOSPITAL_COMMUNITY)
Admission: EM | Admit: 2016-06-14 | Discharge: 2016-06-16 | DRG: 190 | Disposition: A | Discharge status: Home / Self Care | Payer: Medicare Other | Attending: Internal Medicine | Admitting: Internal Medicine

## 2016-06-14 ENCOUNTER — Encounter (HOSPITAL_COMMUNITY): Payer: Self-pay | Admitting: Emergency Medicine

## 2016-06-14 ENCOUNTER — Emergency Department (HOSPITAL_COMMUNITY): Payer: Medicare Other

## 2016-06-14 DIAGNOSIS — F17210 Nicotine dependence, cigarettes, uncomplicated: Secondary | ICD-10-CM | POA: Diagnosis present

## 2016-06-14 DIAGNOSIS — K219 Gastro-esophageal reflux disease without esophagitis: Secondary | ICD-10-CM | POA: Diagnosis present

## 2016-06-14 DIAGNOSIS — G35 Multiple sclerosis: Secondary | ICD-10-CM | POA: Diagnosis present

## 2016-06-14 DIAGNOSIS — Z96653 Presence of artificial knee joint, bilateral: Secondary | ICD-10-CM | POA: Diagnosis present

## 2016-06-14 DIAGNOSIS — R0602 Shortness of breath: Secondary | ICD-10-CM | POA: Diagnosis not present

## 2016-06-14 DIAGNOSIS — F329 Major depressive disorder, single episode, unspecified: Secondary | ICD-10-CM | POA: Diagnosis present

## 2016-06-14 DIAGNOSIS — G629 Polyneuropathy, unspecified: Secondary | ICD-10-CM | POA: Diagnosis present

## 2016-06-14 DIAGNOSIS — J441 Chronic obstructive pulmonary disease with (acute) exacerbation: Secondary | ICD-10-CM | POA: Diagnosis not present

## 2016-06-14 DIAGNOSIS — J9601 Acute respiratory failure with hypoxia: Secondary | ICD-10-CM | POA: Diagnosis present

## 2016-06-14 DIAGNOSIS — F411 Generalized anxiety disorder: Secondary | ICD-10-CM | POA: Diagnosis present

## 2016-06-14 DIAGNOSIS — Z79899 Other long term (current) drug therapy: Secondary | ICD-10-CM

## 2016-06-14 DIAGNOSIS — E876 Hypokalemia: Secondary | ICD-10-CM | POA: Diagnosis present

## 2016-06-14 DIAGNOSIS — E782 Mixed hyperlipidemia: Secondary | ICD-10-CM | POA: Diagnosis present

## 2016-06-14 DIAGNOSIS — Z85828 Personal history of other malignant neoplasm of skin: Secondary | ICD-10-CM

## 2016-06-14 DIAGNOSIS — E538 Deficiency of other specified B group vitamins: Secondary | ICD-10-CM

## 2016-06-14 LAB — BASIC METABOLIC PANEL
ANION GAP: 7 (ref 5–15)
BUN: 11 mg/dL (ref 6–20)
CHLORIDE: 103 mmol/L (ref 101–111)
CO2: 30 mmol/L (ref 22–32)
Calcium: 8.4 mg/dL — ABNORMAL LOW (ref 8.9–10.3)
Creatinine, Ser: 0.81 mg/dL (ref 0.44–1.00)
GFR calc non Af Amer: >60 mL/min (ref >60)
Glucose, Bld: 130 mg/dL — ABNORMAL HIGH (ref 65–99)
POTASSIUM: 3.4 mmol/L — AB (ref 3.5–5.1)
Sodium: 140 mmol/L (ref 135–145)

## 2016-06-14 LAB — CBC WITH DIFFERENTIAL/PLATELET
Basophils Absolute: 0 10*3/uL (ref 0.0–0.1)
Basophils Relative: 0 %
Eosinophils Absolute: 0.1 10*3/uL (ref 0.0–0.7)
Eosinophils Relative: 1 %
HEMATOCRIT: 39.8 % (ref 36.0–46.0)
HEMOGLOBIN: 12.8 g/dL (ref 12.0–15.0)
LYMPHS ABS: 1.5 10*3/uL (ref 0.7–4.0)
Lymphocytes Relative: 14 %
MCH: 29.2 pg (ref 26.0–34.0)
MCHC: 32.2 g/dL (ref 30.0–36.0)
MCV: 90.7 fL (ref 78.0–100.0)
MONOS PCT: 4 %
Monocytes Absolute: 0.4 10*3/uL (ref 0.1–1.0)
NEUTROS ABS: 9 10*3/uL — AB (ref 1.7–7.7)
NEUTROS PCT: 81 %
Platelets: 185 10*3/uL (ref 150–400)
RBC: 4.39 MIL/uL (ref 3.87–5.11)
RDW: 13.6 % (ref 11.5–15.5)
WBC: 11.1 10*3/uL — ABNORMAL HIGH (ref 4.0–10.5)

## 2016-06-14 LAB — EXPECTORATED SPUTUM ASSESSMENT W REFEX TO RESP CULTURE

## 2016-06-14 LAB — EXPECTORATED SPUTUM ASSESSMENT W GRAM STAIN, RFLX TO RESP C

## 2016-06-14 MED ORDER — IPRATROPIUM BROMIDE 0.02 % IN SOLN
1.0000 mg | Freq: Once | RESPIRATORY_TRACT | Status: AC
  Administered 2016-06-14: 1 mg via RESPIRATORY_TRACT
  Filled 2016-06-14: qty 5

## 2016-06-14 MED ORDER — CITALOPRAM HYDROBROMIDE 20 MG PO TABS
20.0000 mg | ORAL_TABLET | Freq: Every day | ORAL | Status: DC
  Administered 2016-06-14 – 2016-06-16 (×3): 20 mg via ORAL
  Filled 2016-06-14 (×3): qty 1

## 2016-06-14 MED ORDER — LEVOFLOXACIN 500 MG PO TABS
500.0000 mg | ORAL_TABLET | Freq: Every day | ORAL | Status: DC
  Administered 2016-06-14 – 2016-06-16 (×4): 500 mg via ORAL
  Filled 2016-06-14 (×4): qty 1

## 2016-06-14 MED ORDER — HYDROCODONE-ACETAMINOPHEN 5-325 MG PO TABS: 1.0000 | ORAL_TABLET | Freq: Four times a day (QID) | ORAL | Status: DC | PRN

## 2016-06-14 MED ORDER — MOMETASONE FURO-FORMOTEROL FUM 200-5 MCG/ACT IN AERO
2.0000 | INHALATION_SPRAY | Freq: Two times a day (BID) | RESPIRATORY_TRACT | Status: DC
  Administered 2016-06-14 – 2016-06-16 (×4): 2 via RESPIRATORY_TRACT
  Filled 2016-06-14: qty 8.8

## 2016-06-14 MED ORDER — GABAPENTIN 400 MG PO CAPS
400.0000 mg | ORAL_CAPSULE | Freq: Three times a day (TID) | ORAL | Status: DC
  Administered 2016-06-14 – 2016-06-16 (×6): 400 mg via ORAL
  Filled 2016-06-14 (×6): qty 1

## 2016-06-14 MED ORDER — ONDANSETRON HCL 4 MG PO TABS: 4.0000 mg | ORAL_TABLET | Freq: Four times a day (QID) | ORAL | Status: DC | PRN

## 2016-06-14 MED ORDER — TIOTROPIUM BROMIDE MONOHYDRATE 18 MCG IN CAPS
18.0000 ug | ORAL_CAPSULE | Freq: Every day | RESPIRATORY_TRACT | Status: DC
  Administered 2016-06-15 – 2016-06-16 (×2): 18 ug via RESPIRATORY_TRACT
  Filled 2016-06-14: qty 5

## 2016-06-14 MED ORDER — CYANOCOBALAMIN 1000 MCG/ML IJ SOLN
1000.0000 ug | INTRAMUSCULAR | Status: DC
  Filled 2016-06-14: qty 1

## 2016-06-14 MED ORDER — PANTOPRAZOLE SODIUM 40 MG PO TBEC
40.0000 mg | DELAYED_RELEASE_TABLET | Freq: Every day | ORAL | Status: DC
  Administered 2016-06-14 – 2016-06-16 (×3): 40 mg via ORAL
  Filled 2016-06-14 (×3): qty 1

## 2016-06-14 MED ORDER — ENOXAPARIN SODIUM 40 MG/0.4ML ~~LOC~~ SOLN
40.0000 mg | SUBCUTANEOUS | Status: DC
  Administered 2016-06-14 – 2016-06-15 (×2): 40 mg via SUBCUTANEOUS
  Filled 2016-06-14 (×2): qty 0.4

## 2016-06-14 MED ORDER — POTASSIUM CHLORIDE CRYS ER 20 MEQ PO TBCR
40.0000 meq | EXTENDED_RELEASE_TABLET | Freq: Once | ORAL | Status: AC
  Administered 2016-06-14: 40 meq via ORAL
  Filled 2016-06-14: qty 2

## 2016-06-14 MED ORDER — ALBUTEROL SULFATE (2.5 MG/3ML) 0.083% IN NEBU
2.5000 mg | INHALATION_SOLUTION | RESPIRATORY_TRACT | Status: DC | PRN
  Administered 2016-06-14: 2.5 mg via RESPIRATORY_TRACT
  Filled 2016-06-14: qty 3

## 2016-06-14 MED ORDER — MELATONIN 10 MG PO TABS
1.0000 | ORAL_TABLET | Freq: Every day | ORAL | Status: DC
Start: 2016-06-14 — End: 2016-06-14

## 2016-06-14 MED ORDER — ONDANSETRON HCL 4 MG/2ML IJ SOLN
4.0000 mg | Freq: Four times a day (QID) | INTRAMUSCULAR | Status: DC | PRN
  Administered 2016-06-14: 4 mg via INTRAVENOUS
  Filled 2016-06-14: qty 2

## 2016-06-14 MED ORDER — ALBUTEROL (5 MG/ML) CONTINUOUS INHALATION SOLN
10.0000 mg/h | INHALATION_SOLUTION | Freq: Once | RESPIRATORY_TRACT | Status: AC
  Administered 2016-06-14: 10 mg/h via RESPIRATORY_TRACT
  Filled 2016-06-14: qty 20

## 2016-06-14 MED ORDER — VITAMIN D 1000 UNITS PO TABS
1000.0000 [IU] | ORAL_TABLET | Freq: Every day | ORAL | Status: DC
  Administered 2016-06-14 – 2016-06-16 (×3): 1000 [IU] via ORAL
  Filled 2016-06-14 (×3): qty 1

## 2016-06-14 MED ORDER — METHYLPREDNISOLONE SODIUM SUCC 125 MG IJ SOLR
60.0000 mg | Freq: Three times a day (TID) | INTRAMUSCULAR | Status: DC
  Administered 2016-06-14 – 2016-06-16 (×7): 60 mg via INTRAVENOUS
  Filled 2016-06-14 (×7): qty 2

## 2016-06-14 MED ORDER — ALBUTEROL SULFATE (2.5 MG/3ML) 0.083% IN NEBU
2.5000 mg | INHALATION_SOLUTION | Freq: Three times a day (TID) | RESPIRATORY_TRACT | Status: DC
  Administered 2016-06-15 – 2016-06-16 (×4): 2.5 mg via RESPIRATORY_TRACT
  Filled 2016-06-14 (×4): qty 3

## 2016-06-14 MED ORDER — FUROSEMIDE 20 MG PO TABS
20.0000 mg | ORAL_TABLET | Freq: Every day | ORAL | Status: DC
  Administered 2016-06-14 – 2016-06-16 (×3): 20 mg via ORAL
  Filled 2016-06-14 (×3): qty 1

## 2016-06-14 NOTE — ED Notes (Signed)
Upon ambulation on room air pt breathing labored, wheezing. O2 saturation decreased to 85 %. Pt c/o feeling weak.

## 2016-06-14 NOTE — ED Notes (Signed)
RT called for breathing tx. 

## 2016-06-14 NOTE — Progress Notes (Signed)
   06/14/16 1718  Vitals  Temp 98.1 F (36.7 C)  Temp Source Oral  BP (!) 142/65  BP Location Right Arm  BP Method Automatic  Patient Position (if appropriate) Sitting  Pulse Rate 69  Resp 20  Pt. Complaining of "bad stomach pain," described as nausea. IV Zofran given, and vitals taken. Vitals, WNL except BP a little high. Pt is in distress and a little anxious at the time. Nursing will continue to monitor.

## 2016-06-14 NOTE — H&P (Signed)
TRH H&P   Patient Demographics:    [name], is a 60 y.o. female  MRN: [id number]   DOB - [birth year]  Admit Date - 06/14/2016  Outpatient Primary MD for the patient is [name], MD  Outpatient Specialists: Dr. Laural Golden   Patient coming from: Home  Chief Complaint  Patient presents with  . Shortness of Breath      HPI:    [name]  is a 60 y.o. female, With history of MS with mild left-sided weakness on a chronic basis, GERD, COPD not on home oxygen, smoking counseled to quit, dyslipidemia, arthritis, migraine headaches, generalized anxiety disorder, depression who comes in with 4 day history of productive cough, shortness of breath and wheezing. Denies any sick contacts, no fever or chills, no chest pain or palpitations. No orthopnea. No exposure to sick contacts , no leg swelling.  In the ER she was found to be hypoxic with acute hypoxic respiratory failure due to COPD exacerbation and I was called to admit the patient. Besides above review of systems all other review of systems are negative.    Review of systems:    In addition to the HPI above,   No Fever-chills, No Headache, No changes with Vision or hearing, No problems swallowing food or Liquids, No Chest pain, as above Cough & Shortness of Breath, No Abdominal pain, No Nausea or Vommitting, Bowel movements are regular, No Blood in stool or Urine, No dysuria, No new skin rashes or bruises, No new joints pains-aches,  No new weakness, tingling, numbness in any extremity, No recent weight gain or loss, No polyuria, polydypsia or polyphagia, No significant Mental Stressors.  A full 10 point Review of Systems was done, except as stated  above, all other Review of Systems were negative.   With Past History of the following :    Past Medical History:  Diagnosis Date  . Anxiety   . Arthritis   . Cancer ([place])    SKIN / "BORDERLINE CERVICAL CANCER"  . COPD (chronic obstructive pulmonary disease) ([place])   . GERD (gastroesophageal reflux disease)   . History of skin cancer   . Mixed hyperlipidemia   . Multiple sclerosis ([place])   . Neuromuscular disorder (HCC)    MIGRAINES  . Neuropathy ([place])    FEET  . PONV (postoperative nausea  and vomiting)   . Pre-diabetes   . Psoriasis   . Weakness of left side of body    DUE TO MS      Past Surgical History:  Procedure Laterality Date  . ABDOMINAL HYSTERECTOMY    . CHOLECYSTECTOMY    . COLONOSCOPY N/A 08/02/2015   Procedure: COLONOSCOPY;  Surgeon: Rogene Houston, MD;  [facility];  Service: Endoscopy;  Laterality: N/A;  12:00-moved to 1220 Ann to notify pt  . KNEE ARTHROSCOPY     RT  . Left breast lumpectomy    . Left carpal tunnel release    . PARTIAL KNEE ARTHROPLASTY Left 01/22/2015   Procedure: LEFT KNEE MEDIAL UNICOMPARTMENTAL KNEE;  Surgeon: Gaynelle Arabian, MD;  Location: [facility];  Service: Orthopedics;  Laterality: Left;  . Right total knee replacement  2010  . SHOULDER SURGERY     RT  . Sinus cyst removal    . TOTAL KNEE REVISION  05/05/2012   Procedure: TOTAL KNEE REVISION;  Surgeon: Gearlean Alf, MD;  Location: [facility];  Service: Orthopedics;  Laterality: Right;  . WRIST SURGERY     rt      Social History:     Social History  Substance Use Topics  . Smoking status: Current Every Day Smoker    Packs/day: 0.50    Years: 40.00    Types: Cigarettes  . Smokeless tobacco: Never Used     Comment: 3/4 pack a day sicne age 73  . Alcohol use No         Family History :     Family History  Problem Relation Age of Onset  . Dementia Mother   . Atrial fibrillation Father     Died with pulmonary embolus       Home Medications:   Prior  to Admission medications   Medication Sig Start Date End Date Taking? Authorizing Provider  albuterol (ACCUNEB) 1.25 MG/3ML nebulizer solution Take 3 mLs (1.25 mg total) by nebulization every 6 (six) hours as needed for wheezing. 12/12/13  Yes Mary-Margaret Hassell Done, FNP  albuterol (VENTOLIN HFA) 108 (90 Base) MCG/ACT inhaler Inhale 2 puffs into the lungs every 6 (six) hours as needed. For shortness of breath 03/03/16  Yes [name], MD  azithromycin [facility] Z-PAK) 250 MG tablet Take as directed 06/10/16  Yes Wardell Honour, MD  cholecalciferol (VITAMIN D) 1000 units tablet Take 1,000 Units by mouth daily.   Yes Historical Provider, MD  citalopram (CELEXA) 20 MG tablet TAKE ONE (1) TABLET EACH DAY 06/13/16  Yes [name], MD  furosemide (LASIX) 20 MG tablet Take 1 tablet (20 mg total) by mouth daily. As needed 10/29/15  Yes [name], MD  gabapentin (NEURONTIN) 600 MG tablet TAKE ONE TABLET FOUR TIMES DAILY 05/30/16  Yes [name], MD  Melatonin 10 MG TABS Take 1 tablet by mouth at bedtime.   Yes Historical Provider, MD  pantoprazole (PROTONIX) 40 MG tablet TAKE ONE (1) TABLET EACH DAY 10/29/15  Yes [name], MD  predniSONE (DELTASONE) 20 MG tablet Take 2 tablets daily 2 days 1-1/2 tablets daily 2 days 1 tablet daily 2 days on half tablet daily 2 days 06/10/16  Yes Wardell Honour, MD  triamcinolone (KENALOG) 0.025 % ointment Apply 1 application topically 2 (two) times daily. 03/03/16  Yes [name], MD  XIIDRA 5 % SOLN  04/22/16  Yes Historical Provider, MD     Allergies:  Allergies  Allergen Reactions  . Fenofibrate     unknown  . Pravastatin Other (See Comments)    "flu like symptoms"   . Penicillins Other (See Comments)    Mouth gets blisters     Physical Exam:   Vitals  Blood pressure 131/69, pulse 81, temperature 98.1 F (36.7 C), resp. rate (!) 27, height 5\' 10"  (1.778 m), weight 94.3 kg (208 lb), SpO2 93 %.   1. General middle aged  white female lying in bed in NAD,     2. Normal affect and insight, Not Suicidal or Homicidal, Awake Alert, Oriented X 3.  3. No F.N deficits, ALL C.Nerves Intact, Strength 5/5 all 4 extremities at left side mildly weaker than right on a chronic basis, Sensation intact all 4 extremities, Plantars down going.  4. Ears and Eyes appear Normal, Conjunctivae clear, PERRLA. Moist Oral Mucosa.  5. Supple Neck, No JVD, No cervical lymphadenopathy appriciated, No Carotid Bruits.  6. Symmetrical Chest wall movement, moderate air movement bilaterally, mild bilateral wheezing  7. RRR, No Gallops, Rubs or Murmurs, No Parasternal Heave.  8. Positive Bowel Sounds, Abdomen Soft, No tenderness, No organomegaly appriciated,No rebound -guarding or rigidity.  9.  No Cyanosis, Normal Skin Turgor, No Skin Rash or Bruise.  10. Good muscle tone,  joints appear normal , no effusions, Normal ROM.  11. No Palpable Lymph Nodes in Neck or Axillae      Data Review:    CBC  Recent Labs Lab 06/14/16 0814  WBC 11.1*  HGB 12.8  HCT 39.8  PLT 185  MCV 90.7  MCH 29.2  MCHC 32.2  RDW 13.6  LYMPHSABS 1.5  MONOABS 0.4  EOSABS 0.1  BASOSABS 0.0   ------------------------------------------------------------------------------------------------------------------  Chemistries   Recent Labs Lab 06/14/16 0814  NA 140  K 3.4*  CL 103  CO2 30  GLUCOSE 130*  BUN 11  CREATININE 0.81  CALCIUM 8.4*   ------------------------------------------------------------------------------------------------------------------ estimated creatinine clearance is 95.3 mL/min (by C-G formula based on SCr of 0.81 mg/dL). ------------------------------------------------------------------------------------------------------------------ No results for input(s): TSH, T4TOTAL, T3FREE, THYROIDAB in the last 72 hours.  Invalid input(s): FREET3  Coagulation profile No results for input(s): INR, PROTIME in the last 168  hours. ------------------------------------------------------------------------------------------------------------------- No results for input(s): DDIMER in the last 72 hours. -------------------------------------------------------------------------------------------------------------------  Cardiac Enzymes No results for input(s): CKMB, TROPONINI, MYOGLOBIN in the last 168 hours.  Invalid input(s): CK ------------------------------------------------------------------------------------------------------------------ No results found for: BNP   ---------------------------------------------------------------------------------------------------------------  Urinalysis    Component Value Date/Time   COLORURINE YELLOW 01/12/2015 1351   APPEARANCEUR CLOUDY (A) 01/12/2015 1351   LABSPEC 1.019 01/12/2015 1351   PHURINE 5.0 01/12/2015 1351   GLUCOSEU NEGATIVE 01/12/2015 1351   HGBUR TRACE (A) 01/12/2015 1351   BILIRUBINUR NEG 06/08/2015 1511   KETONESUR NEGATIVE 01/12/2015 1351   PROTEINUR NEG 06/08/2015 1511   PROTEINUR NEGATIVE 01/12/2015 1351   UROBILINOGEN negative 06/08/2015 1511   UROBILINOGEN 0.2 01/12/2015 1351   NITRITE NEG 06/08/2015 1511   NITRITE NEGATIVE 01/12/2015 1351   LEUKOCYTESUR Trace (A) 06/08/2015 1511    ----------------------------------------------------------------------------------------------------------------   Imaging Results:    Dg Chest 2 View  Result Date: 06/14/2016 CLINICAL DATA:  Cough and fever for several days.  Weakness. EXAM: CHEST  2 VIEW COMPARISON:  03/28/2015 FINDINGS: The heart size and mediastinal contours are within normal limits. Both lungs are clear. No pleural effusion or pneumothorax. The visualized skeletal structures are unremarkable. IMPRESSION: No active cardiopulmonary disease. Electronically Signed   By: Dedra Skeens.D.  On: 06/14/2016 08:13    My personal review of EKG: Rhythm NSR,   no Acute ST changes   Assessment &  Plan:     1. Acute hypoxic respiratory failure due to acute on chronic COPD exacerbation. Patient will be admitted, Solu-Medrol IV, nebulizer treatments, oxygen, Levaquin, sputum Gram stain culture, patient counseled to quit smoking. If better discharge in 2-3 days with outpatient pulmonary follow-up.  2. MS. Chronic mild left-sided weakness, no acute issues, outpatient follow-up with neurologist.  3. GERD. On PPI continue.  4. Chronic anxiety and Depression. On Celexa continue.  5. Hypokalemia. Replaced. Recheck in the morning.  6. Smoking. Counseled to quit   DVT Prophylaxis  Lovenox   AM Labs Ordered, also please review Full Orders  Family Communication: Admission, patients condition and plan of care including tests being ordered have been discussed with the patient and husband who indicate understanding and agree with the plan and Code Status.  Code Status Full  Likely DC to  Home 2-3 days  Condition GUARDED    Consults called: None    Admission status: Inpt    Time spent in minutes : 35   Kolby Schara K M.D on 06/14/2016 at 11:07 AM  Between 7am to 7pm - Pager - [phone]. After 7pm go to [email or web address] - password [facility]  [facility] - Office  [phone]

## 2016-06-14 NOTE — ED Triage Notes (Signed)
Pt c/o sob since Tuesday. Has completed a Z-pack and is currently on Prednisone. Pt received 2 albuterol nebulizer treatments and 125 solumedrol IV in route to ED.

## 2016-06-14 NOTE — ED Provider Notes (Addendum)
[facility] DEPT Provider Note   CSN: [id number] Arrival date & time: 06/14/16  0734     History   Chief Complaint Chief Complaint  Patient presents with  . Shortness of Breath    HPI [name] is a 60 y.o. female.Complains of cough productive of yellow and green sputum onset 4 days ago accompanied by shortness of breath and wheezing. She was seen by her primary care physician this past week prescribed prednisone and a Z-Pak which she's taken without relief. She also took albuterol at home, without relief. She was treated by EMS with Solu-Medrol IV and a nebulized treatment with partial relief. Other associated symptoms include fatigue. She denies fever denies vomiting. No other associated symptoms  HPI  Past Medical History:  Diagnosis Date  . Anxiety   . Arthritis   . Cancer ([place])    SKIN / "BORDERLINE CERVICAL CANCER"  . COPD (chronic obstructive pulmonary disease) ([place])   . GERD (gastroesophageal reflux disease)   . History of skin cancer   . Mixed hyperlipidemia   . Multiple sclerosis ([place])   . Neuromuscular disorder (HCC)    MIGRAINES  . 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 PONV (postoperative nausea and vomiting)   . Pre-diabetes   . Psoriasis   . Weakness of left side of body    DUE TO MS    Patient Active Problem List   Diagnosis Date Noted  . S/P hysterectomy 03/03/2016  . Pre-diabetes 09/20/2015  . Generalized anxiety disorder 07/30/2015  . OA (osteoarthritis) of knee 01/22/2015  . B12 deficiency anemia 03/20/2014  . BMI 29.0-29.9,adult 03/20/2014  . Gastroesophageal reflux disease without esophagitis 02/21/2014  . Multiple sclerosis ([place]) 01/17/2013  . COPD with chronic bronchitis ([place]) 01/17/2013  . Mixed hyperlipidemia 03/30/2012  . Tobacco abuse 03/30/2012    Past Surgical History:  Procedure Laterality Date  . ABDOMINAL HYSTERECTOMY    . CHOLECYSTECTOMY    . COLONOSCOPY N/A 08/02/2015   Procedure: COLONOSCOPY;  Surgeon: Rogene Houston,  MD;  [facility];  Service: Endoscopy;  Laterality: N/A;  12:00-moved to 1220 Ann to notify pt  . KNEE ARTHROSCOPY     RT  . Left breast lumpectomy    . Left carpal tunnel release    . PARTIAL KNEE ARTHROPLASTY Left 01/22/2015   Procedure: LEFT KNEE MEDIAL UNICOMPARTMENTAL KNEE;  Surgeon: Gaynelle Arabian, MD;  [facility];  Service: Orthopedics;  Laterality: Left;  . Right total knee replacement  2010  . SHOULDER SURGERY     RT  . Sinus cyst removal    . TOTAL KNEE REVISION  05/05/2012   Procedure: TOTAL KNEE REVISION;  Surgeon: Gearlean Alf, MD;  Location[facility];  Service: Orthopedics;  Laterality: Right;  . WRIST SURGERY     rt    OB History    No data available       Home Medications    Prior to Admission medications   Medication Sig Start Date End Date Taking? Authorizing Provider  albuterol (ACCUNEB) 1.25 MG/3ML nebulizer solution Take 3 mLs (1.25 mg total) by nebulization every 6 (six) hours as needed for wheezing. 12/12/13   Mary-Margaret Hassell Done, FNP  albuterol (VENTOLIN HFA) 108 (90 Base) MCG/ACT inhaler Inhale 2 puffs into the lungs every 6 (six) hours as needed. For shortness of breath 03/03/16   [name], MD  azithromycin (ZITHROMAX Z-PAK) 250 MG tablet Take as directed 06/10/16   Wardell Honour, MD  citalopram (  CELEXA) 20 MG tablet TAKE ONE (1) TABLET EACH DAY 06/13/16   [name], MD  furosemide (LASIX) 20 MG tablet Take 1 tablet (20 mg total) by mouth daily. As needed 10/29/15   [name], MD  gabapentin (NEURONTIN) 600 MG tablet TAKE ONE TABLET FOUR TIMES DAILY 05/30/16   [name], MD  Melatonin 10 MG TABS Take 1 tablet by mouth at bedtime.    Historical Provider, MD  pantoprazole (PROTONIX) 40 MG tablet TAKE ONE (1) TABLET EACH DAY 10/29/15   [name], MD  predniSONE (DELTASONE) 20 MG tablet Take 2 tablets daily 2 days 1-1/2 tablets daily 2 days 1 tablet daily 2 days on half tablet daily 2 days 06/10/16   Wardell Honour, MD  triamcinolone (KENALOG) 0.025 % ointment Apply 1 application topically 2 (two) times daily. 03/03/16   [name], MD  XIIDRA 5 % SOLN  04/22/16   Historical Provider, MD    Family History Family History  Problem Relation Age of Onset  . Dementia Mother   . Atrial fibrillation Father     Died with pulmonary embolus    Social History Social History  Substance Use Topics  . Smoking status: Current Every Day Smoker    Packs/day: 0.50    Years: 40.00    Types: Cigarettes  . Smokeless tobacco: Never Used     Comment: 3/4 pack a day sicne age 34  . Alcohol use No     Allergies   Fenofibrate; Pravastatin; and Penicillins   Review of Systems Review of Systems  Constitutional: Positive for fatigue.  HENT: Negative.   Respiratory: Positive for shortness of breath and wheezing.   Cardiovascular: Negative.   Gastrointestinal: Negative.   Musculoskeletal: Negative.   Skin: Negative.   Neurological: Negative.   Psychiatric/Behavioral: Negative.   All other systems reviewed and are negative.    Physical Exam Updated Vital Signs BP 143/60   Pulse 79   Temp 98.1 F (36.7 C)   Resp 16   Ht 5\' 10"  (1.778 m)   Wt 208 lb (94.3 kg)   SpO2 93%   BMI 29.84 kg/m   Physical Exam  Constitutional: She appears well-developed and well-nourished. She appears distressed.  Moderately ill-appearing  HENT:  Head: Normocephalic and atraumatic.  Eyes: Conjunctivae are normal. Pupils are equal, round, and reactive to light.  Neck: Neck supple. No tracheal deviation present. No thyromegaly present.  Cardiovascular: Normal rate and regular rhythm.   No murmur heard. Pulmonary/Chest: Effort normal and breath sounds normal.  Coughing frequently, speaks in sentences. Diffuse coarse rhonchi  Abdominal: Soft. Bowel sounds are normal. She exhibits no distension. There is no tenderness.  Musculoskeletal: Normal range of motion. She exhibits no edema or tenderness.    Neurological: She is alert. Coordination normal.  Skin: Skin is warm and dry. No rash noted.  Psychiatric: She has a normal mood and affect.  Nursing note and vitals reviewed.    ED Treatments / Results  Labs (all labs ordered are listed, but only abnormal results are displayed) Labs Reviewed  BASIC METABOLIC PANEL  CBC WITH DIFFERENTIAL/PLATELET    EKG  EKG Interpretation  Date/Time:  Saturday June 14 2016 07:35:31 EDT Ventricular Rate:  86 PR Interval:    QRS Duration: 79 QT Interval:  394 QTC Calculation: 472 R Axis:   -10 Text Interpretation:  Sinus rhythm Probable LVH with secondary repol abnrm Inferior infarct, old SINCE LAST TRACING HEART RATE HAS INCREASED  Nonspecific T wave abnormality New since previous tracing Confirmed by Winfred Leeds  MD, Sunshine Mackowski [phone]) on 06/14/2016 7:41:38 AM       Radiology No results found.  Procedures Procedures (including critical care time) Results for orders placed or performed during the hospital encounter of [id number]  Basic metabolic panel  Result Value Ref Range   Sodium 140 135 - 145 mmol/L   Potassium 3.4 (L) 3.5 - 5.1 mmol/L   Chloride 103 101 - 111 mmol/L   CO2 30 22 - 32 mmol/L   Glucose, Bld 130 (H) 65 - 99 mg/dL   BUN 11 6 - 20 mg/dL   Creatinine, Ser 0.81 0.44 - 1.00 mg/dL   Calcium 8.4 (L) 8.9 - 10.3 mg/dL   GFR calc non Af Amer >60 >60 mL/min   GFR calc Af Amer >60 >60 mL/min   Anion gap 7 5 - 15  CBC with Differential/Platelet  Result Value Ref Range   WBC 11.1 (H) 4.0 - 10.5 K/uL   RBC 4.39 3.87 - 5.11 MIL/uL   Hemoglobin 12.8 12.0 - 15.0 g/dL   HCT 39.8 36.0 - 46.0 %   MCV 90.7 78.0 - 100.0 fL   MCH 29.2 26.0 - 34.0 pg   MCHC 32.2 30.0 - 36.0 g/dL   RDW 13.6 11.5 - 15.5 %   Platelets 185 150 - 400 K/uL   Neutrophils Relative % 81 %   Neutro Abs 9.0 (H) 1.7 - 7.7 K/uL   Lymphocytes Relative 14 %   Lymphs Abs 1.5 0.7 - 4.0 K/uL   Monocytes Relative 4 %   Monocytes Absolute 0.4 0.1 - 1.0 K/uL    Eosinophils Relative 1 %   Eosinophils Absolute 0.1 0.0 - 0.7 K/uL   Basophils Relative 0 %   Basophils Absolute 0.0 0.0 - 0.1 K/uL   Dg Chest 2 View  Result Date: 06/14/2016 CLINICAL DATA:  Cough and fever for several days.  Weakness. EXAM: CHEST  2 VIEW COMPARISON:  03/28/2015 FINDINGS: The heart size and mediastinal contours are within normal limits. Both lungs are clear. No pleural effusion or pneumothorax. The visualized skeletal structures are unremarkable. IMPRESSION: No active cardiopulmonary disease. Electronically Signed   By: Lajean Manes M.D.   On: 06/14/2016 08:13   Medications Ordered in ED Medications  albuterol (PROVENTIL,VENTOLIN) solution continuous neb (not administered)  ipratropium (ATROVENT) nebulizer solution 1 mg (not administered)   Chest x-ray viewed by me Results for orders placed or performed during the hospital encounter of [id number]  Basic metabolic panel  Result Value Ref Range   Sodium 140 135 - 145 mmol/L   Potassium 3.4 (L) 3.5 - 5.1 mmol/L   Chloride 103 101 - 111 mmol/L   CO2 30 22 - 32 mmol/L   Glucose, Bld 130 (H) 65 - 99 mg/dL   BUN 11 6 - 20 mg/dL   Creatinine, Ser 0.81 0.44 - 1.00 mg/dL   Calcium 8.4 (L) 8.9 - 10.3 mg/dL   GFR calc non Af Amer >60 >60 mL/min   GFR calc Af Amer >60 >60 mL/min   Anion gap 7 5 - 15  CBC with Differential/Platelet  Result Value Ref Range   WBC 11.1 (H) 4.0 - 10.5 K/uL   RBC 4.39 3.87 - 5.11 MIL/uL   Hemoglobin 12.8 12.0 - 15.0 g/dL   HCT 39.8 36.0 - 46.0 %   MCV 90.7 78.0 - 100.0 fL   MCH 29.2 26.0 - 34.0 pg   MCHC 32.2 30.0 - 36.0  g/dL   RDW 13.6 11.5 - 15.5 %   Platelets 185 150 - 400 K/uL   Neutrophils Relative % 81 %   Neutro Abs 9.0 (H) 1.7 - 7.7 K/uL   Lymphocytes Relative 14 %   Lymphs Abs 1.5 0.7 - 4.0 K/uL   Monocytes Relative 4 %   Monocytes Absolute 0.4 0.1 - 1.0 K/uL   Eosinophils Relative 1 %   Eosinophils Absolute 0.1 0.0 - 0.7 K/uL   Basophils Relative 0 %   Basophils Absolute 0.0  0.0 - 0.1 K/uL   Dg Chest 2 View  Result Date: 06/14/2016 CLINICAL DATA:  Cough and fever for several days.  Weakness. EXAM: CHEST  2 VIEW COMPARISON:  03/28/2015 FINDINGS: The heart size and mediastinal contours are within normal limits. Both lungs are clear. No pleural effusion or pneumothorax. The visualized skeletal structures are unremarkable. IMPRESSION: No active cardiopulmonary disease. Electronically Signed   By: Lajean Manes M.D.   On: 06/14/2016 08:13    Initial Impression / Assessment and Plan / ED Course  I have reviewed the triage vital signs and the nursing notes.  Pertinent labs & imaging results that were available during my care of the patient were reviewed by me and considered in my medical decision making (see chart for details).  Clinical Course   10:40 AM After treatment with one hour of continuous nebulization. She feels improved however breathing is not baseline. Lungs with coarse rhonchi diffusely. No retractions. Upon getting up to ambulate her pulse oximetry desaturates to 85%. Consistent with hypoxia.  Chest x-ray viewed by me Dr.Singh from hospital service consulted. Will see patient in ED. Plan admit medical surgical floor. Patient will require further nebulized treatment and oxygen therapy  Final Clinical Impressions(s) / ED Diagnoses  Diagnoses #1 exacerbation of COPD #2 hypoxia #3 hypokalemia CRITICAL CARE Performed by: Orlie Dakin Total critical care time:  minutes30 Critical care time was exclusive of separately billable procedures and treating other patients. Critical care was necessary to treat or prevent imminent or life-threatening deterioration. Critical care was time spent personally by me on the following activities: development of treatment plan with patient and/or surrogate as well as nursing, discussions with consultants, evaluation of patient's response to treatment, examination of patient, obtaining history from patient or surrogate,  ordering and performing treatments and interventions, ordering and review of laboratory studies, ordering and review of radiographic studies, pulse oximetry and re-evaluation of patient's condition. Final diagnoses:  None    New Prescriptions New Prescriptions   No medications on file     Orlie Dakin, MD 06/14/16 [place], MD 06/14/16 1057

## 2016-06-15 LAB — BASIC METABOLIC PANEL
Anion gap: 7 (ref 5–15)
BUN: 13 mg/dL (ref 6–20)
CHLORIDE: 99 mmol/L — AB (ref 101–111)
CO2: 31 mmol/L (ref 22–32)
CREATININE: 0.79 mg/dL (ref 0.44–1.00)
Calcium: 8.9 mg/dL (ref 8.9–10.3)
GFR calc Af Amer: >60 mL/min (ref >60)
GLUCOSE: 159 mg/dL — AB (ref 65–99)
POTASSIUM: 4 mmol/L (ref 3.5–5.1)
SODIUM: 137 mmol/L (ref 135–145)

## 2016-06-15 LAB — MAGNESIUM: MAGNESIUM: 2.1 mg/dL (ref 1.7–2.4)

## 2016-06-15 NOTE — Progress Notes (Signed)
Pt ambulated approx. 150 feet. Did complain of getting slightly tired but not as bad as before. O2 sats were obtained throughout the walk. See Vitals Flowsheets.

## 2016-06-15 NOTE — Progress Notes (Signed)
Pt. Ambulated in the hall with [place], [place]. For about 100 feet. Pt O2 sat was 97% starting out with her oxygen on. Her O2 was removed and she walked 100 feet and her sat dropped down to 89%. Once she sat back down and had 2L of O2 applied her sats went back up to 97%. Pt does not wear O2 at home.

## 2016-06-15 NOTE — Progress Notes (Signed)
PROGRESS NOTE                                                                                                                                                                                                             Patient Demographics:    [name], is a 60 y.o. female, DOB - December 01, 1955, [id number]  Admit date - 06/14/2016   Admitting Physician Thurnell Lose, MD  Outpatient Primary MD for the patient is [name], MD  LOS - 1  Chief Complaint  Patient presents with  . Shortness of Breath       Brief Narrative   [name]  is a 60 y.o. female, With history of MS with mild left-sided weakness on a chronic basis, GERD, COPD not on home oxygen, smoking counseled to quit, dyslipidemia, arthritis, migraine headaches, generalized anxiety disorder, depression who comes in with 4 day history of productive cough, shortness of breath and wheezing. Denies any sick contacts, no fever or chills, no chest pain or palpitations. No orthopnea. No exposure to sick contacts , no leg swelling.  In the ER she was found to be hypoxic with acute hypoxic respiratory failure due to COPD exacerbation and I was called to admit the patient. Besides above review of systems all other review of systems are negative.   Subjective:    [name] today has, No headache, No chest pain, No abdominal pain - No Nausea, No new weakness tingling or numbness, Improved Cough - SOB.     Assessment  & Plan :       1. Acute hypoxic respiratory failure due to acute on chronic COPD exacerbation. Much improved on Solu-Medrol IV, nebulizer treatments, oxygen, Levaquin, sputum Gram stain culture, patient counseled to quit smoking. If better discharge in am with outpatient pulmonary follow-up. Check ambulatory pulse ox today.  2. MS. Chronic mild left-sided weakness, no acute issues, outpatient follow-up with neurologist.  3. GERD. On PPI  continue.  4. Chronic anxiety and Depression. On Celexa continue.  5. Hypokalemia. Replaced. Recheck in the morning.  6. Smoking. Counseled to quit    Family Communication  :  Husband  Code Status :  Full  Diet : Diet Heart Room service appropriate? Yes; Fluid consistency: Thin    Disposition Plan  :  Home in am  Consults  :  None  Procedures  :  None  DVT Prophylaxis  :  Lovenox   Lab Results  Component Value Date   PLT 185 06/14/2016    Inpatient Medications  Scheduled Meds: . albuterol  2.5 mg Nebulization TID  . cholecalciferol  1,000 Units Oral Daily  . citalopram  20 mg Oral Daily  . [START ON 06/16/2016] cyanocobalamin  1,000 mcg Intramuscular Q30 days  . enoxaparin (LOVENOX) injection  40 mg Subcutaneous Q24H  . furosemide  20 mg Oral Daily  . gabapentin  400 mg Oral TID  . levofloxacin  500 mg Oral Daily  . methylPREDNISolone (SOLU-MEDROL) injection  60 mg Intravenous TID  . mometasone-formoterol  2 puff Inhalation BID  . pantoprazole  40 mg Oral Daily  . tiotropium  18 mcg Inhalation Daily   Continuous Infusions: PRN Meds:.albuterol, HYDROcodone-acetaminophen, ondansetron **OR** ondansetron (ZOFRAN) IV  Antibiotics  :    Anti-infectives    Start     Dose/Rate Route Frequency Ordered Stop   06/14/16 1100  levofloxacin (LEVAQUIN) tablet 500 mg     500 mg Oral Daily 06/14/16 1051           Objective:   Vitals:   06/15/16 0604 06/15/16 0746 06/15/16 0753 06/15/16 0757  BP: (!) 146/63     Pulse: 76     Resp: 20     Temp: 98.7 F (37.1 C)     TempSrc: Oral     SpO2: 94% 95% 95% 95%  Weight:      Height:        Wt Readings from Last 3 Encounters:  06/14/16 94.3 kg (208 lb)  06/10/16 95.3 kg (210 lb)  05/12/16 95.8 kg (211 lb 3.2 oz)     Intake/Output Summary (Last 24 hours) at 06/15/16 0857 Last data filed at 06/15/16 0604  Gross per 24 hour  Intake              240 ml  Output                0 ml  Net              240 ml      Physical Exam  Awake Alert, Oriented X 3, No new F.N deficits, Normal affect [place].AT,PERRAL Supple Neck,No JVD, No cervical lymphadenopathy appriciated.  Symmetrical Chest wall movement, Good air movement bilaterally, few wheezes RRR,No Gallops,Rubs or new Murmurs, No Parasternal Heave +ve B.Sounds, Abd Soft, No tenderness, No organomegaly appriciated, No rebound - guarding or rigidity. No Cyanosis, Clubbing or edema, No new Rash or bruise       Data Review:    CBC  Recent Labs Lab 06/14/16 0814  WBC 11.1*  HGB 12.8  HCT 39.8  PLT 185  MCV 90.7  MCH 29.2  MCHC 32.2  RDW 13.6  LYMPHSABS 1.5  MONOABS 0.4  EOSABS 0.1  BASOSABS 0.0    Chemistries   Recent Labs Lab 06/14/16 0814 06/15/16 0606  NA 140 137  K 3.4* 4.0  CL 103 99*  CO2 30 31  GLUCOSE 130* 159*  BUN 11 13  CREATININE 0.81 0.79  CALCIUM 8.4* 8.9  MG  --  2.1   ------------------------------------------------------------------------------------------------------------------ No results for input(s): CHOL, HDL, LDLCALC, TRIG, CHOLHDL, LDLDIRECT in the last 72 hours.  Lab Results  Component Value Date   HGBA1C 6.1 09/10/2015   ------------------------------------------------------------------------------------------------------------------ No results for input(s): TSH, T4TOTAL, T3FREE, THYROIDAB in the last 72 hours.  Invalid input(s): FREET3 ------------------------------------------------------------------------------------------------------------------ No  results for input(s): VITAMINB12, FOLATE, FERRITIN, TIBC, IRON, RETICCTPCT in the last 72 hours.  Coagulation profile No results for input(s): INR, PROTIME in the last 168 hours.  No results for input(s): DDIMER in the last 72 hours.  Cardiac Enzymes No results for input(s): CKMB, TROPONINI, MYOGLOBIN in the last 168 hours.  Invalid input(s):  CK ------------------------------------------------------------------------------------------------------------------ No results found for: BNP  Micro Results Recent Results (from the past 240 hour(s))  Culture, expectorated sputum-assessment     Status: None   Collection Time: 06/14/16  9:06 PM  Result Value Ref Range Status   Specimen Description SPUTUM  Final   Special Requests NONE  Final   Sputum evaluation THIS SPECIMEN IS ACCEPTABLE FOR SPUTUM CULTURE  Final   Report Status 06/14/2016 FINAL  Final    Radiology Reports Dg Chest 2 View  Result Date: 06/14/2016 CLINICAL DATA:  Cough and fever for several days.  Weakness. EXAM: CHEST  2 VIEW COMPARISON:  03/28/2015 FINDINGS: The heart size and mediastinal contours are within normal limits. Both lungs are clear. No pleural effusion or pneumothorax. The visualized skeletal structures are unremarkable. IMPRESSION: No active cardiopulmonary disease. Electronically Signed   By: Lajean Manes M.D.   On: 06/14/2016 08:13    Time Spent in minutes  30   Ledford Goodson K M.D on 06/15/2016 at 8:57 AM  Between 7am to 7pm - Pager - [phone]  After 7pm go to [email or web address] - password [facility]  [facility] -  Office  [phone]

## 2016-06-16 MED ORDER — LEVOFLOXACIN 500 MG PO TABS: 500.0000 mg | ORAL_TABLET | Freq: Every day | ORAL | 0 refills | Status: DC

## 2016-06-16 MED ORDER — PREDNISONE 5 MG PO TABS: ORAL_TABLET | ORAL | 0 refills | Status: DC

## 2016-06-16 MED ORDER — FLUTICASONE-SALMETEROL 250-50 MCG/DOSE IN AEPB: 1.0000 | INHALATION_SPRAY | Freq: Two times a day (BID) | RESPIRATORY_TRACT | 0 refills | Status: DC

## 2016-06-16 MED ORDER — TIOTROPIUM BROMIDE MONOHYDRATE 18 MCG IN CAPS
18.0000 ug | ORAL_CAPSULE | Freq: Every day | RESPIRATORY_TRACT | 0 refills | Status: DC
Start: 2016-06-17 — End: 2016-07-17

## 2016-06-16 NOTE — Progress Notes (Signed)
SATURATION QUALIFICATIONS: (This note is used to comply with regulatory documentation for home oxygen)  Patient Saturations on Room Air at Rest = 93%  Patient Saturations on Room Air while Ambulating = 85%  Patient Saturations on 2 Liters of oxygen while Ambulating = 92%, as continued walking went down to 80 and O2 up to 3L, SPO2% to 97.  Please briefly explain why patient needs home oxygen:  Pt SpO2% dropped to 85% while ambulating on room air and needed to placed on O2 to return above 90%

## 2016-06-16 NOTE — Progress Notes (Signed)
Pt IV removed, tolerated well.  HH has delivered O2 to pt room.  Reveiwed discharge instructions with pt and answered questions at this time.

## 2016-06-16 NOTE — Care Management Note (Signed)
Case Management Note  Patient Details  Name: [name] MRN: [id number] Date of Birth: March 30, 1956  Subjective/Objective:                  Pt admitted with COPD. Pt is from home and ind with ADL's. She has PCP, transportation and insurance with drug coverage. She has neb machine at home PTA. She is discharging home today and has met requirements for home oxgyen. She has chosen [facility] from list of DME providers. Romualdo Bolk, made aware and pt will have port tanks delivered to pt room prior to DC.   Action/Plan: Discharging home with self care at supplemental oxygen.   Expected Discharge Date:    06/16/2016             Expected Discharge Plan:  Home/Self Care  In-House Referral:  NA  Discharge planning Services  CM Consult  Post Acute Care Choice:  Durable Medical Equipment Choice offered to:  Patient  DME Arranged:  Oxygen DME Agency:  [place].  Status of Service:  Completed, signed off  Sherald Barge, RN 06/16/2016, 8:56 AM

## 2016-06-16 NOTE — Care Management Important Message (Signed)
Important Message  Patient Details  Name: [name] MRN: [id number] Date of Birth: [birth year]   Medicare Important Message Given:  Yes    Sherald Barge, RN 06/16/2016, 8:54 AM

## 2016-06-16 NOTE — Discharge Summary (Signed)
[name] [id number] DOB: 1955-09-09 DOA: 06/14/2016  PCP: Michelle Maize, MD  Admit date: 06/14/2016  Discharge date: 06/16/2016  Admitted From: Home   Disposition:  Home   Recommendations for Outpatient Follow-up:   Follow up with PCP in 1-2 weeks  PCP Please obtain BMP/CBC, 2 view CXR in 1week,  (see Discharge instructions)   PCP Please follow up on the following pending results: None   Home Health: None   Equipment/Devices: o2  Consultations: None Discharge Condition: Stable   CODE STATUS: Full   Diet Recommendation:  Heart Healthy     Chief Complaint  Patient presents with  . Shortness of Breath     Brief history of present illness from the day of admission and additional interim summary    [name] a 60 y.o.female,With history of MS with mild left-sided weakness on a chronic basis, GERD, COPD not on home oxygen, smoking counseled to quit, dyslipidemia, arthritis, migraine headaches, generalized anxiety disorder, depression who comes in with 4 day history of productive cough, shortness of breath and wheezing. Denies any sick contacts, no fever or chills, no chest pain or palpitations. No orthopnea. No exposure to sick contacts , no leg swelling.  In the ER she was found to be hypoxic with acute hypoxic respiratory failure due to COPD exacerbation and I was called to admit the patient. Besides above review of systems all other review of systems are negative.  Hospital issues addressed     1. Acute hypoxic respiratory failure due to acute on chronic COPD exacerbation. Much improved After  Solu-Medrol IV, nebulizer treatments, oxygen and Levaquin, sputum Gram stain culture so far negative, patient counseled to quit smoking. This morning she is much better, feeling close to baseline  and eager to go home, she did qualify for home oxygen which will be provided at 2 L nasal cannula, she will be placed on a prednisone taper, Levaquin for 3 days, have added Ezequiel Ganser and Advair to her albuterol nebs. Outpatient pulmonary and PCP follow-up.  2.MS. Chronic mild left-sided weakness, no acute issues, outpatient follow-up with neurologist.  3.GERD. On PPI continue.  4.Chronic anxiety and Depression. On Celexa continue.  5.Hypokalemia. Replaced and stable.  6.Smoking. Counseled to quit    Discharge diagnosis     Principal Problem:   COPD exacerbation ([place]) Active Problems:   Mixed hyperlipidemia   Tobacco abuse   Multiple sclerosis ([place])   Gastroesophageal reflux disease without esophagitis   Generalized anxiety disorder    Discharge instructions    Discharge Instructions    Diet - low sodium heart healthy    Complete by:  As directed    Discharge instructions    Complete by:  As directed    Follow with Primary MD Michelle Maize, MD in 7 days   Get CBC, CMP, 2 view Chest X ray checked  by Primary MD or SNF MD in 5-7 days ( we routinely change or add medications that can affect your baseline labs and fluid status, therefore we recommend  that you get the mentioned basic workup next visit with your PCP, your PCP may decide not to get them or add new tests based on their clinical decision)   Activity: As tolerated with Full fall precautions use walker/cane & assistance as needed   Disposition Home     Diet:   Heart Healthy.  For Heart failure patients - Check your Weight same time everyday, if you gain over 2 pounds, or you develop in leg swelling, experience more shortness of breath or chest pain, call your Primary MD immediately. Follow Cardiac Low Salt Diet and 1.5 lit/day fluid restriction.   On your next visit with your primary care physician please Get Medicines reviewed and adjusted.   Please request your Prim.MD to go over all Hospital  Tests and Procedure/Radiological results at the follow up, please get all Hospital records sent to your Prim MD by signing hospital release before you go home.   If you experience worsening of your admission symptoms, develop shortness of breath, life threatening emergency, suicidal or homicidal thoughts you must seek medical attention immediately by calling 911 or calling your MD immediately  if symptoms less severe.  You Must read complete instructions/literature along with all the possible adverse reactions/side effects for all the Medicines you take and that have been prescribed to you. Take any new Medicines after you have completely understood and accpet all the possible adverse reactions/side effects.   Do not drive, operate heavy machinery, perform activities at heights, swimming or participation in water activities or provide baby sitting services if your were admitted for syncope or siezures until you have seen by Primary MD or a Neurologist and advised to do so again.  Do not drive when taking Pain medications.    Do not take more than prescribed Pain, Sleep and Anxiety Medications  Special Instructions: If you have smoked or chewed Tobacco  in the last 2 yrs please stop smoking, stop any regular Alcohol  and or any Recreational drug use.  Wear Seat belts while driving.   Please note  You were cared for by a hospitalist during your hospital stay. If you have any questions about your discharge medications or the care you received while you were in the hospital after you are discharged, you can call the unit and asked to speak with the hospitalist on call if the hospitalist that took care of you is not available. Once you are discharged, your primary care physician will handle any further medical issues. Please note that NO REFILLS for any discharge medications will be authorized once you are discharged, as it is imperative that you return to your primary care physician (or establish a  relationship with a primary care physician if you do not have one) for your aftercare needs so that they can reassess your need for medications and monitor your lab values.   Increase activity slowly    Complete by:  As directed       Discharge Medications     Medication List    STOP taking these medications   azithromycin 250 MG tablet Commonly known as:  ZITHROMAX Z-PAK   predniSONE 20 MG tablet Commonly known as:  DELTASONE Replaced by:  predniSONE 5 MG tablet     TAKE these medications   albuterol 1.25 MG/3ML nebulizer solution Commonly known as:  ACCUNEB Take 3 mLs (1.25 mg total) by nebulization every 6 (six) hours as needed for wheezing.   albuterol 108 (90 Base) MCG/ACT inhaler Commonly known as:  VENTOLIN HFA Inhale 2 puffs into the lungs every 6 (six) hours as needed. For shortness of breath   cholecalciferol 1000 units tablet Commonly known as:  VITAMIN D Take 1,000 Units by mouth daily.   citalopram 20 MG tablet Commonly known as:  CELEXA TAKE ONE (1) TABLET EACH DAY   Fluticasone-Salmeterol 250-50 MCG/DOSE Aepb Commonly known as:  ADVAIR DISKUS Inhale 1 puff into the lungs 2 (two) times daily.   furosemide 20 MG tablet Commonly known as:  LASIX Take 1 tablet (20 mg total) by mouth daily. As needed   gabapentin 600 MG tablet Commonly known as:  NEURONTIN TAKE ONE TABLET FOUR TIMES DAILY   levofloxacin 500 MG tablet Commonly known as:  LEVAQUIN Take 1 tablet (500 mg total) by mouth daily.   Melatonin 10 MG Tabs Take 1 tablet by mouth at bedtime.   pantoprazole 40 MG tablet Commonly known as:  PROTONIX TAKE ONE (1) TABLET EACH DAY   predniSONE 5 MG tablet Commonly known as:  DELTASONE Label  & dispense according to the schedule below. 10 Pills PO for 3 days then, 8 Pills PO for 3 days, 6 Pills PO for 3 days, 4 Pills PO for 3 days, 2 Pills PO for 3 days, 1 Pills PO for 3 days, 1/2 Pill  PO for 3 days then STOP. Total 95 pills. Replaces:   predniSONE 20 MG tablet   tiotropium 18 MCG inhalation capsule Commonly known as:  SPIRIVA Place 1 capsule (18 mcg total) into inhaler and inhale daily. Start taking on:  06/17/2016   triamcinolone 0.025 % ointment Commonly known as:  KENALOG Apply 1 application topically 2 (two) times daily.   XIIDRA 5 % Soln Generic drug:  Lifitegrast       Follow-up Information    Michelle Maize, MD. Schedule an appointment as soon as possible for a visit in 1 week(s).   Specialty:  Pediatrics Contact information: [place] [address] [phone]        Alonza Bogus, MD. Schedule an appointment as soon as possible for a visit in 1 week(s).   Specialty:  Pulmonary Disease Why:  COPD Contact information: [place] [address] [phone]           Major procedures and Radiology Reports - PLEASE review detailed and final reports thoroughly  -        Dg Chest 2 View  Result Date: 06/14/2016 CLINICAL DATA:  Cough and fever for several days.  Weakness. EXAM: CHEST  2 VIEW COMPARISON:  03/28/2015 FINDINGS: The heart size and mediastinal contours are within normal limits. Both lungs are clear. No pleural effusion or pneumothorax. The visualized skeletal structures are unremarkable. IMPRESSION: No active cardiopulmonary disease. Electronically Signed   By: Lajean Manes M.D.   On: 06/14/2016 08:13    Micro Results     Recent Results (from the past 240 hour(s))  Culture, expectorated sputum-assessment     Status: None   Collection Time: 06/14/16  9:06 PM  Result Value Ref Range Status   Specimen Description SPUTUM  Final   Special Requests NONE  Final   Sputum evaluation THIS SPECIMEN IS ACCEPTABLE FOR SPUTUM CULTURE  Final   Report Status 06/14/2016 FINAL  Final    Today   Subjective    [name] today has no headache,no chest abdominal pain,no new weakness tingling or numbness, feels much better wants to go home  today.    Objective  Blood pressure (!) 123/51, pulse 70, temperature 98.9 F (37.2 C), temperature source Oral, resp. rate 20, height 5\' 10"  (1.778 m), weight 91.9 kg (202 lb 9.6 oz), SpO2 94 %.  No intake or output data in the 24 hours ending 06/16/16 0831  Exam Awake Alert, Oriented x 3, No new F.N deficits, Normal affect [place].AT,PERRAL Supple Neck,No JVD, No cervical lymphadenopathy appriciated.  Symmetrical Chest wall movement, Good air movement bilaterally, few whezes RRR,No Gallops,Rubs or new Murmurs, No Parasternal Heave +ve B.Sounds, Abd Soft, Non tender, No organomegaly appriciated, No rebound -guarding or rigidity. No Cyanosis, Clubbing or edema, No new Rash or bruise   Data Review   CBC w Diff:  Lab Results  Component Value Date   WBC 11.1 (H) 06/14/2016   HGB 12.8 06/14/2016   HCT 39.8 06/14/2016   HCT 40.8 03/03/2016   PLT 185 06/14/2016   PLT 236 03/03/2016   LYMPHOPCT 14 06/14/2016   MONOPCT 4 06/14/2016   EOSPCT 1 06/14/2016   BASOPCT 0 06/14/2016    CMP:  Lab Results  Component Value Date   NA 137 06/15/2016   NA 142 03/03/2016   K 4.0 06/15/2016   CL 99 (L) 06/15/2016   CO2 31 06/15/2016   BUN 13 06/15/2016   BUN 10 03/03/2016   CREATININE 0.79 06/15/2016   CREATININE 0.79 01/10/2013   PROT 6.1 06/08/2015   ALBUMIN 4.0 06/08/2015   BILITOT <0.2 06/08/2015   ALKPHOS 75 06/08/2015   AST 13 06/08/2015   ALT 9 06/08/2015  .   Total Time in preparing paper work, data evaluation and todays exam - 35 minutes  Thurnell Lose M.D on 06/16/2016 at 8:31 AM  [facility]   Office  [phone]

## 2016-06-16 NOTE — Discharge Instructions (Signed)
Follow with Primary MD [name], MD in 7 days   Get CBC, CMP, 2 view Chest X ray checked  by Primary MD or SNF MD in 5-7 days ( we routinely change or add medications that can affect your baseline labs and fluid status, therefore we recommend that you get the mentioned basic workup next visit with your PCP, your PCP may decide not to get them or add new tests based on their clinical decision)   Activity: As tolerated with Full fall precautions use walker/cane & assistance as needed   Disposition Home     Diet:   Heart Healthy.  For Heart failure patients - Check your Weight same time everyday, if you gain over 2 pounds, or you develop in leg swelling, experience more shortness of breath or chest pain, call your Primary MD immediately. Follow Cardiac Low Salt Diet and 1.5 lit/day fluid restriction.   On your next visit with your primary care physician please Get Medicines reviewed and adjusted.   Please request your Prim.MD to go over all Hospital Tests and Procedure/Radiological results at the follow up, please get all Hospital records sent to your Prim MD by signing hospital release before you go home.   If you experience worsening of your admission symptoms, develop shortness of breath, life threatening emergency, suicidal or homicidal thoughts you must seek medical attention immediately by calling 911 or calling your MD immediately  if symptoms less severe.  You Must read complete instructions/literature along with all the possible adverse reactions/side effects for all the Medicines you take and that have been prescribed to you. Take any new Medicines after you have completely understood and accpet all the possible adverse reactions/side effects.   Do not drive, operate heavy machinery, perform activities at heights, swimming or participation in water activities or provide baby sitting services if your were admitted for syncope or siezures until you have seen by Primary MD or a  Neurologist and advised to do so again.  Do not drive when taking Pain medications.    Do not take more than prescribed Pain, Sleep and Anxiety Medications  Special Instructions: If you have smoked or chewed Tobacco  in the last 2 yrs please stop smoking, stop any regular Alcohol  and or any Recreational drug use.  Wear Seat belts while driving.   Please note  You were cared for by a hospitalist during your hospital stay. If you have any questions about your discharge medications or the care you received while you were in the hospital after you are discharged, you can call the unit and asked to speak with the hospitalist on call if the hospitalist that took care of you is not available. Once you are discharged, your primary care physician will handle any further medical issues. Please note that NO REFILLS for any discharge medications will be authorized once you are discharged, as it is imperative that you return to your primary care physician (or establish a relationship with a primary care physician if you do not have one) for your aftercare needs so that they can reassess your need for medications and monitor your lab values.

## 2016-06-18 LAB — CULTURE, RESPIRATORY W GRAM STAIN: Culture: NORMAL

## 2016-06-18 LAB — CULTURE, RESPIRATORY

## 2016-06-19 ENCOUNTER — Telehealth: Payer: Self-pay | Admitting: Pediatrics

## 2016-06-25 ENCOUNTER — Encounter: Payer: Self-pay | Admitting: Pediatrics

## 2016-06-25 ENCOUNTER — Ambulatory Visit (INDEPENDENT_AMBULATORY_CARE_PROVIDER_SITE_OTHER): Payer: Medicare Other | Admitting: Pediatrics

## 2016-06-25 VITALS — BP 118/68 | HR 72 | Temp 98.8°F | Resp 20 | Ht 70.0 in | Wt 211.2 lb

## 2016-06-25 DIAGNOSIS — J449 Chronic obstructive pulmonary disease, unspecified: Secondary | ICD-10-CM | POA: Diagnosis not present

## 2016-06-25 DIAGNOSIS — Z720 Tobacco use: Secondary | ICD-10-CM

## 2016-06-25 MED ORDER — NYSTATIN 100000 UNIT/ML MT SUSP: 5.0000 mL | Freq: Four times a day (QID) | OROMUCOSAL | 0 refills | Status: DC

## 2016-06-25 NOTE — Patient Instructions (Signed)
Take spiriva every day Use nystatin only if getting thrush again Stop spiriva if mouth/throat symptoms come back

## 2016-06-25 NOTE — Progress Notes (Signed)
  Subjective:   Patient ID: [name], female    DOB: 1955-12-12, 60 y.o.   MRN: [id number] CC: Hospitalization Follow-up (COPD Exacerbation)  HPI: [name] is a 60 y.o. female presenting for Hospitalization Follow-up (COPD Exacerbation)  Feeling much better Admitted with COPD exacerbation Still has several days left of steroid taper Minimal coughing still Minimal SOB, much improved Not needing albuterol last couple of days Was started on dulera in the hospitla, prescribed advair at discharge She says her mouth got so raw from the dulera that she stopped, has not picked up the advair Has happened in the past as well Has been told she gets thrush  Mouth much better now off of inhaled steroid Was treated for thrush in the hospital  Relevant past medical, surgical, family and social history reviewed. Allergies and medications reviewed and updated. History  Smoking Status  . Current Every Day Smoker  . Packs/day: 0.50  . Years: 40.00  . Types: Cigarettes  Smokeless Tobacco  . Never Used    Comment: 3/4 pack a day sicne age 19   ROS: Per HPI   Objective:    BP 118/68   Pulse 72   Temp 98.8 F (37.1 C) (Oral)   Resp 20   Ht 5\' 10"  (1.778 m)   Wt 211 lb 3.2 oz (95.8 kg)   SpO2 97%   BMI 30.30 kg/m   Wt Readings from Last 3 Encounters:  06/25/16 211 lb 3.2 oz (95.8 kg)  06/16/16 202 lb 9.6 oz (91.9 kg)  06/10/16 210 lb (95.3 kg)    Gen: NAD, alert, cooperative with exam, NCAT EYES: EOMI, no conjunctival injection, or no icterus ENT:  TMs pearly gray b/l, OP without erythema, no thrush or other oral lesions LYMPH: no cervical LAD CV: NRRR, normal S1/S2, no murmur, distal pulses 2+ b/l Resp: CTABL, no wheezes, normal WOB Abd: +BS, soft, NTND.  Ext: No edema, warm Neuro: Alert and oriented  Assessment & Plan:  [name] was seen today for hospitalization follow-up. I have reviewed hospital records, labs, imaging.  Diagnoses and all orders for this  visit:  COPD with chronic bronchitis ([place]) Symptoms much improved Has not tolerated controller medications in the past, likely due to inhaled corticosteroids Will try spiriva alone first, without inh steroid Cont PO steroid taper If using albuterol more often, needs to be seen  Tobacco abuse Cont to encourage tobacco cessation  H/o thrush Use as needed for thrush Normal exam today No pain in mouth now -     nystatin (MYCOSTATIN) 100000 UNIT/ML suspension; Take 5 mLs (500,000 Units total) by mouth 4 (four) times daily.   Follow up plan: Return in about 4 weeks (around 07/23/2016). Assunta Found, MD [place]

## 2016-06-27 ENCOUNTER — Ambulatory Visit (INDEPENDENT_AMBULATORY_CARE_PROVIDER_SITE_OTHER): Payer: Medicare Other | Admitting: *Deleted

## 2016-06-27 DIAGNOSIS — E538 Deficiency of other specified B group vitamins: Secondary | ICD-10-CM | POA: Diagnosis not present

## 2016-06-27 DIAGNOSIS — D518 Other vitamin B12 deficiency anemias: Secondary | ICD-10-CM

## 2016-06-27 NOTE — Progress Notes (Signed)
Pt given vit B12 inj Tolerated well 

## 2016-07-11 ENCOUNTER — Other Ambulatory Visit: Payer: Self-pay | Admitting: Pediatrics

## 2016-07-11 DIAGNOSIS — G629 Polyneuropathy, unspecified: Secondary | ICD-10-CM

## 2016-07-14 ENCOUNTER — Encounter: Payer: Self-pay | Admitting: Pediatrics

## 2016-07-14 ENCOUNTER — Ambulatory Visit (INDEPENDENT_AMBULATORY_CARE_PROVIDER_SITE_OTHER): Payer: Medicare Other | Admitting: Pediatrics

## 2016-07-14 VITALS — BP 117/66 | HR 69 | Temp 98.9°F | Resp 16 | Ht 70.0 in | Wt 207.8 lb

## 2016-07-14 DIAGNOSIS — J441 Chronic obstructive pulmonary disease with (acute) exacerbation: Secondary | ICD-10-CM | POA: Diagnosis not present

## 2016-07-14 DIAGNOSIS — Z720 Tobacco use: Secondary | ICD-10-CM

## 2016-07-14 DIAGNOSIS — Z6829 Body mass index (BMI) 29.0-29.9, adult: Secondary | ICD-10-CM

## 2016-07-14 MED ORDER — LEVOFLOXACIN 750 MG PO TABS: 750.0000 mg | ORAL_TABLET | Freq: Every day | ORAL | 0 refills | Status: DC

## 2016-07-14 MED ORDER — ALBUTEROL SULFATE (2.5 MG/3ML) 0.083% IN NEBU: 2.5000 mg | INHALATION_SOLUTION | Freq: Four times a day (QID) | RESPIRATORY_TRACT | 1 refills | Status: DC | PRN

## 2016-07-14 MED ORDER — PREDNISONE 20 MG PO TABS: ORAL_TABLET | ORAL | 0 refills | Status: DC

## 2016-07-14 NOTE — Progress Notes (Signed)
  Subjective:   Patient ID: [name], female    DOB: 27-Apr-1956, 60 y.o.   MRN: [id number] CC: Cough; Laryngitis; and chest congestion  HPI: [name] is a 60 y.o. female presenting for Cough; Laryngitis; and chest congestion  Using albuterol about twice a day Started getting sick 5 days ago Started with wheezing, coughing, congestion Other family members sick over thanksgiving No fevers Increased coughing Increased sputum production, now green Continues to smoke, Tobacco use down to 5-10 cig a day Still interested in quitting Recent hospitalization for COPD exacerbation Has been using spiriva, advair at home for past 2 weeks  Relevant past medical, surgical, family and social history reviewed. Allergies and medications reviewed and updated. History  Smoking Status  . Current Every Day Smoker  . Packs/day: 0.50  . Years: 40.00  . Types: Cigarettes  Smokeless Tobacco  . Never Used    Comment: 3/4 pack a day sicne age 11   ROS: Per HPI   Objective:    BP 117/66   Pulse 69   Temp 98.9 F (37.2 C) (Oral)   Resp 16   Ht 5\' 10"  (1.778 m)   Wt 207 lb 12.8 oz (94.3 kg)   SpO2 97%   BMI 29.82 kg/m   Wt Readings from Last 3 Encounters:  07/14/16 207 lb 12.8 oz (94.3 kg)  06/25/16 211 lb 3.2 oz (95.8 kg)  06/16/16 202 lb 9.6 oz (91.9 kg)    Gen: NAD, alert, cooperative with exam, NCAT, coughing frequently EYES: EOMI, no conjunctival injection, or no icterus ENT:  TMs pearly gray b/l, OP without erythema LYMPH: no cervical LAD CV: NRRR, normal S1/S2, no murmur, distal pulses 2+ b/l Resp: prolonged expiratory phase, speaks in complete sentences, b/l wheezing with exhalation, comfortable WOB, moving air fair Ext: No edema, warm Neuro: Alert and oriented MSK: normal muscle bulk  Assessment & Plan:  [name] was seen today for cough, laryngitis and chest congestion.  Diagnoses and all orders for this visit:  COPD exacerbation ([place]) 97% O2 sat Comfortable  WOB, increased sputum, now green, increased coughing, SOB  Albuterol TID while wheezing Prednisone burst, levoflox as below  Referral to pulm as pt continues to have exacerbations Cont advair, spiriva -     predniSONE (DELTASONE) 20 MG tablet; 2 po at same time daily for 5 days -     levofloxacin (LEVAQUIN) 750 MG tablet; Take 1 tablet (750 mg total) by mouth daily. -     albuterol (PROVENTIL) (2.5 MG/3ML) 0.083% nebulizer solution; Take 3 mLs (2.5 mg total) by nebulization every 6 (six) hours as needed for wheezing or shortness of breath. -     Ambulatory referral to Pulmonology  Tobacco abuse Discussed again, must stop smoking to help prevent these exacerbations Pt says she was hoping to do weight less than 200lb this year, smoking next year Frequent prednisone bursts have made weight loss hard Cont to encourage tobacco cessation  BMI 29.0-29.9,adult Discussed lifestyle changes, need to get breathin gin better control so doesn't need prednisone  Follow up plan: 3 mo Assunta Found, MD [place]

## 2016-07-17 ENCOUNTER — Other Ambulatory Visit: Payer: Self-pay | Admitting: *Deleted

## 2016-07-17 ENCOUNTER — Telehealth: Payer: Self-pay | Admitting: Pediatrics

## 2016-07-17 MED ORDER — TIOTROPIUM BROMIDE MONOHYDRATE 18 MCG IN CAPS: 18.0000 ug | ORAL_CAPSULE | Freq: Every day | RESPIRATORY_TRACT | 3 refills | Status: DC

## 2016-07-17 NOTE — Telephone Encounter (Signed)
Left detailed message regarding RX Okayed per Dr Evette Doffing per protocol

## 2016-07-23 ENCOUNTER — Ambulatory Visit: Payer: Medicare Other | Admitting: Pediatrics

## 2016-07-24 ENCOUNTER — Encounter: Payer: Self-pay | Admitting: Internal Medicine

## 2016-07-24 ENCOUNTER — Ambulatory Visit (INDEPENDENT_AMBULATORY_CARE_PROVIDER_SITE_OTHER): Payer: Medicare Other | Admitting: Internal Medicine

## 2016-07-24 VITALS — BP 118/70 | HR 61

## 2016-07-24 DIAGNOSIS — F17210 Nicotine dependence, cigarettes, uncomplicated: Secondary | ICD-10-CM

## 2016-07-24 DIAGNOSIS — J449 Chronic obstructive pulmonary disease, unspecified: Secondary | ICD-10-CM | POA: Diagnosis not present

## 2016-07-24 MED ORDER — BUDESONIDE-FORMOTEROL FUMARATE 160-4.5 MCG/ACT IN AERO: INHALATION_SPRAY | RESPIRATORY_TRACT | 11 refills | Status: DC

## 2016-07-24 NOTE — Progress Notes (Signed)
   Subjective:    Patient ID: [name], female    DOB: [birth year], 60 y.o.   MRN: [id number]  HPI    Review of Systems  Constitutional: Negative for chills, fever and unexpected weight change.  HENT: Negative for congestion, dental problem, ear pain, nosebleeds, postnasal drip, rhinorrhea, sinus pressure, sneezing, sore throat, trouble swallowing and voice change.   Eyes: Negative for visual disturbance.  Respiratory: Negative for cough, choking and shortness of breath.   Cardiovascular: Negative for chest pain and leg swelling.  Gastrointestinal: Negative for abdominal pain, diarrhea and vomiting.  Genitourinary: Negative for difficulty urinating.  Musculoskeletal: Negative for arthralgias.  Skin: Negative for rash.  Neurological: Negative for tremors, syncope and headaches.  Hematological: Does not bruise/bleed easily.       Objective:   Physical Exam        Assessment & Plan:

## 2016-07-24 NOTE — Patient Instructions (Addendum)
Plan A = Automatic = Just use Symbicort  160 Take 2 puffs first thing in am and then another 2 puffs about 12 hours later.    Work on inhaler technique:  relax and gently blow all the way out then take a nice smooth deep breath back in, triggering the inhaler at same time you start breathing in.  Hold for up to 5 seconds if you can. Blow out thru nose. Rinse and gargle with water when done   if any worsening or symbicort add the spiriva back each am/ return here if need a cheaper alternative for symbicort pending follow up visit with pfts   Plan B = Backup Only use your albuterol as a rescue medication to be used if you can't catch your breath by resting or doing a relaxed purse lip breathing pattern.  - The less you use it, the better it will work when you need it. - Ok to use the inhaler up to 2 puffs  every 4 hours if you must but call for appointment if use goes up over your usual need - Don't leave home without it !!  (think of it like the spare tire for your car)   Plan C = Crisis - only use your albuterol nebulizer if you first try Plan B and it fails to help > ok to use the nebulizer up to every 4 hours but if start needing it regularly call for immediate appointment   Plan D = Doctor - call me if B and C not adequate  Plan E = ER - go to ER or call 911 if all else fails     The key is to stop smoking completely before smoking completely stops you!   Please schedule a follow up office visit in 6 weeks, call sooner if needed with pfts

## 2016-07-24 NOTE — Progress Notes (Signed)
Subjective:    Patient ID: [name], female    DOB: 1956/01/16, 60 y.o.   MRN: [id number]  HPI  88 yowf active smoker aware of sob around 2012 then [name] confirmed dx in 2013 copd (no actual data on report/ just the impression)  and referred to pulmonary clinic 07/24/2016 by Dr   [name]   07/24/2016 1st [facility] office visit/ [name]   Chief Complaint  Patient presents with  . Pulmonary Consult    breathing is good, just finished prednisone 4 days ago, cold dry air makes breathing worse  when at best can power walk at the mall which typically happens p prednisone on maint with  just spiriva but concerned about freq of needing pred and the severity of the flares cough/sob.  Typically on good days does not use saba at all   No obvious patterns in day to day or daytime variability other than worse sob with cold air exp and no  assoc excess/ purulent sputum or mucus plugs or hemoptysis or cp or chest tightness, subjective wheeze or overt sinus or hb symptoms. No unusual exp hx or h/o childhood pna/ asthma or knowledge of premature birth.  Sleeping ok without nocturnal  or early am exacerbation  of respiratory  c/o's or need for noct saba. Also denies any obvious fluctuation of symptoms with weather or environmental changes or other aggravating or alleviating factors except as outlined above   Current Medications, Allergies, Complete Past Medical History, Past Surgical History, Family History, and Social History were reviewed in [employer] record.  ROS  The following are not active complaints unless bolded sore throat, dysphagia, dental problems, itching, sneezing,  nasal congestion or excess/ purulent secretions, ear ache,   fever, chills, sweats, unintended wt loss, classically pleuritic or exertional cp,  orthopnea pnd or leg swelling, presyncope, palpitations, abdominal pain, anorexia, nausea, vomiting, diarrhea  or change in bowel or bladder habits,  change in stools or urine, dysuria,hematuria,  rash, arthralgias, visual complaints, headache, numbness, weakness or ataxia or problems with walking or coordination,  change in mood/affect or memory.            Review of Systems  HENT: Positive for dental problem.   Respiratory: Positive for cough and shortness of breath.   Musculoskeletal: Positive for arthralgias.  Neurological: Positive for headaches.       Objective:   Physical Exam  amb wf nad   Wt Readings from Last 3 Encounters:  07/14/16 207 lb 12.8 oz (94.3 kg)  06/25/16 211 lb 3.2 oz (95.8 kg)  06/16/16 202 lb 9.6 oz (91.9 kg)    Vital signs reviewed - Note on arrival 02 sats  97% on RA     HEENT: nl dentition, turbinates, and oropharynx. Nl external ear canals without cough reflex   NECK :  without JVD/Nodes/TM/ nl carotid upstrokes bilaterally   LUNGS: no acc muscle use,  Nl contour chest which is clear to A and P bilaterally without cough on insp or exp maneuvers   CV:  RRR  no s3 or murmur or increase in P2, nad no edema   ABD:  soft and nontender with nl inspiratory excursion in the supine position. No bruits or organomegaly appreciated, bowel sounds nl  MS:  Nl gait/ ext warm without deformities, calf tenderness, cyanosis or clubbing No obvious joint restrictions   SKIN: warm and dry without lesions    NEURO:  alert, approp, nl sensorium with  no motor  or cerebellar deficits apparent.    . I personally reviewed images and agree with radiology impression as follows:  CXR:   06/14/16 No active cardiopulmonary disease.        Assessment & Plan:

## 2016-07-25 NOTE — Assessment & Plan Note (Addendum)
Spirometry 07/24/2016  FEV1 2.06 (64%)  Ratio 64 p am spiriva  - 07/24/2016  After extensive coaching HFA effectiveness =    75% > try off spiriva dpi and on symbicort 160   Clinically she is more of Group A GOLD  Pattern acting much more like AB than Emphysema phenotype with prednisone responsiveness so should do better on symbicort but needs of course to quit smoking if at all possible (see separate a/p)   Total time devoted to counseling  > 50 % of 60 office visit:  review case with pt/ discussion of options/alternatives/ personally creating written customized instructions  in presence of pt  then going over those specific  Instructions directly with the pt including how to use all of the meds but in particular covering each new medication in detail and the difference between the maintenance/automatic meds and the prns using an action plan format for the latter.  Please see AVS from this visit for a full list of these instructions   We'll see how she does just on the symb 160 2bid and f/u in 6 weeks with full pfts

## 2016-07-25 NOTE — Assessment & Plan Note (Signed)

## 2016-07-29 ENCOUNTER — Ambulatory Visit (INDEPENDENT_AMBULATORY_CARE_PROVIDER_SITE_OTHER): Payer: Medicare Other | Admitting: *Deleted

## 2016-07-29 DIAGNOSIS — E538 Deficiency of other specified B group vitamins: Secondary | ICD-10-CM | POA: Diagnosis not present

## 2016-07-29 DIAGNOSIS — D518 Other vitamin B12 deficiency anemias: Secondary | ICD-10-CM

## 2016-07-29 NOTE — Progress Notes (Signed)
Pt given Vit B12 inj Tolerated well 

## 2016-08-15 ENCOUNTER — Other Ambulatory Visit: Payer: Self-pay | Admitting: Pediatrics

## 2016-08-15 DIAGNOSIS — G629 Polyneuropathy, unspecified: Secondary | ICD-10-CM

## 2016-08-26 ENCOUNTER — Telehealth: Payer: Self-pay

## 2016-09-01 ENCOUNTER — Ambulatory Visit: Payer: Medicare Other

## 2016-09-01 ENCOUNTER — Ambulatory Visit (INDEPENDENT_AMBULATORY_CARE_PROVIDER_SITE_OTHER): Payer: Medicare HMO | Admitting: *Deleted

## 2016-09-01 DIAGNOSIS — D518 Other vitamin B12 deficiency anemias: Secondary | ICD-10-CM

## 2016-09-01 DIAGNOSIS — E538 Deficiency of other specified B group vitamins: Secondary | ICD-10-CM | POA: Diagnosis not present

## 2016-09-01 NOTE — Telephone Encounter (Signed)
x

## 2016-09-01 NOTE — Progress Notes (Signed)
Vitamin b12 injection given and patient tolerated well.  

## 2016-09-01 NOTE — Patient Instructions (Signed)
Cyanocobalamin, Vitamin B12 injection What is this medicine? CYANOCOBALAMIN (sye an oh koe BAL a min) is a man made form of vitamin B12. Vitamin B12 is used in the growth of healthy blood cells, nerve cells, and proteins in the body. It also helps with the metabolism of fats and carbohydrates. This medicine is used to treat people who can not absorb vitamin B12. This medicine may be used for other purposes; ask your health care provider or pharmacist if you have questions. COMMON BRAND NAME(S): B-12 Compliance Kit, B-12 Injection Kit, Cyomin, LA-12, Nutri-Twelve, Physicians EZ Use B-12, Primabalt What should I tell my health care provider before I take this medicine? They need to know if you have any of these conditions: -kidney disease -Leber's disease -megaloblastic anemia -an unusual or allergic reaction to cyanocobalamin, cobalt, other medicines, foods, dyes, or preservatives -pregnant or trying to get pregnant -breast-feeding How should I use this medicine? This medicine is injected into a muscle or deeply under the skin. It is usually given by a health care professional in a clinic or doctor's office. However, your doctor may teach you how to inject yourself. Follow all instructions. Talk to your pediatrician regarding the use of this medicine in children. Special care may be needed. Overdosage: If you think you have taken too much of this medicine contact a poison control center or emergency room at once. NOTE: This medicine is only for you. Do not share this medicine with others. What if I miss a dose? If you are given your dose at a clinic or doctor's office, call to reschedule your appointment. If you give your own injections and you miss a dose, take it as soon as you can. If it is almost time for your next dose, take only that dose. Do not take double or extra doses. What may interact with this medicine? -colchicine -heavy alcohol intake This list may not describe all possible  interactions. Give your health care provider a list of all the medicines, herbs, non-prescription drugs, or dietary supplements you use. Also tell them if you smoke, drink alcohol, or use illegal drugs. Some items may interact with your medicine. What should I watch for while using this medicine? Visit your doctor or health care professional regularly. You may need blood work done while you are taking this medicine. You may need to follow a special diet. Talk to your doctor. Limit your alcohol intake and avoid smoking to get the best benefit. What side effects may I notice from receiving this medicine? Side effects that you should report to your doctor or health care professional as soon as possible: -allergic reactions like skin rash, itching or hives, swelling of the face, lips, or tongue -blue tint to skin -chest tightness, pain -difficulty breathing, wheezing -dizziness -red, swollen painful area on the leg Side effects that usually do not require medical attention (report to your doctor or health care professional if they continue or are bothersome): -diarrhea -headache This list may not describe all possible side effects. Call your doctor for medical advice about side effects. You may report side effects to FDA at 1-800-FDA-1088. Where should I keep my medicine? Keep out of the reach of children. Store at room temperature between 15 and 30 degrees C (59 and 85 degrees F). Protect from light. Throw away any unused medicine after the expiration date. NOTE: This sheet is a summary. It may not cover all possible information. If you have questions about this medicine, talk to your doctor, pharmacist, or   health care provider.  2017 Elsevier/Gold Standard (2007-11-08 22:10:20)  

## 2016-09-04 DIAGNOSIS — Z08 Encounter for follow-up examination after completed treatment for malignant neoplasm: Secondary | ICD-10-CM | POA: Diagnosis not present

## 2016-09-04 DIAGNOSIS — Z85828 Personal history of other malignant neoplasm of skin: Secondary | ICD-10-CM | POA: Diagnosis not present

## 2016-09-04 DIAGNOSIS — L821 Other seborrheic keratosis: Secondary | ICD-10-CM | POA: Diagnosis not present

## 2016-09-05 ENCOUNTER — Ambulatory Visit (INDEPENDENT_AMBULATORY_CARE_PROVIDER_SITE_OTHER): Payer: Medicare HMO | Admitting: Internal Medicine

## 2016-09-05 ENCOUNTER — Encounter: Payer: Self-pay | Admitting: Internal Medicine

## 2016-09-05 VITALS — BP 122/68 | HR 74 | Ht 70.5 in | Wt 215.0 lb

## 2016-09-05 DIAGNOSIS — F17210 Nicotine dependence, cigarettes, uncomplicated: Secondary | ICD-10-CM | POA: Diagnosis not present

## 2016-09-05 DIAGNOSIS — J449 Chronic obstructive pulmonary disease, unspecified: Secondary | ICD-10-CM

## 2016-09-05 NOTE — Patient Instructions (Signed)
No change in medications   If you are satisfied with your treatment plan,  let your doctor know and he/she can either refill your medications or you can return here when your prescription runs out.     If in any way you are not 100% satisfied,  please tell us.  If 100% better, tell your friends!  Pulmonary follow up is as needed

## 2016-09-05 NOTE — Progress Notes (Signed)
Subjective:    Patient ID: [name], female    DOB: Jun 04, 1956  MRN: [id number]    Brief patient profile: 56 yowf active smoker aware of sob around 2012 then [name] confirmed dx in 2013 copd (no actual data on report/ just the impression)  and referred to pulmonary clinic 07/24/2016 by Dr   Evette Doffing   07/24/2016 1st [facility] office visit/ [name]   Chief Complaint  Patient presents with  . Pulmonary Consult    breathing is good, just finished prednisone 4 days ago, cold dry air makes breathing worse  when at best can power walk at the mall which typically happens p prednisone on maint with  just spiriva but concerned about freq of needing pred and the severity of the flares cough/sob.  Typically on good days does not use saba at all  rec Plan A = Automatic = Just use Symbicort  160 Take 2 puffs first thing in am and then another 2 puffs about 12 hours later.  Work on inhaler technique:    if any worsening or symbicort add the spiriva back each am/ return here if need a cheaper alternative for symbicort pending follow up visit with pfts  Plan B = Backup Only use your albuterol as a rescue medication  Plan C = Crisis - only use your albuterol nebulizer if you first try Plan B and it fails to help > ok to use the nebulizer up to every 4 hours but if start needing it regularly call for immediate appointment The key is to stop smoking completely before smoking completely stops you!     09/05/2016  f/u ov/[name] re:  GOLD II still smoking just on symb 160 2bid  Chief Complaint  Patient presents with  . Follow-up    Breathing is "great". She has not needed albuterol inhaler or neb since her last visit. No new co's today.   back to power walking including some hills so MMRC=0  And no need for saba at all    No obvious patterns in day to day or daytime variability other than worse sob with cold air exp and no  assoc excess/ purulent sputum or mucus plugs or hemoptysis or cp or  chest tightness, subjective wheeze or overt sinus or hb symptoms. No unusual exp hx or h/o childhood pna/ asthma or knowledge of premature birth.  Sleeping ok without nocturnal  or early am exacerbation  of respiratory  c/o's or need for noct saba. Also denies any obvious fluctuation of symptoms with weather or environmental changes or other aggravating or alleviating factors except as outlined above   Current Medications, Allergies, Complete Past Medical History, Past Surgical History, Family History, and Social History were reviewed in [employer] record.  ROS  The following are not active complaints unless bolded sore throat, dysphagia, dental problems, itching, sneezing,  nasal congestion or excess/ purulent secretions, ear ache,   fever, chills, sweats, unintended wt loss, classically pleuritic or exertional cp,  orthopnea pnd or leg swelling, presyncope, palpitations, abdominal pain, anorexia, nausea, vomiting, diarrhea  or change in bowel or bladder habits, change in stools or urine, dysuria,hematuria,  rash, arthralgias minimal visual complaints, headache, numbness, weakness or ataxia or problems with walking or coordination,  change in mood/affect or memory.                   Objective:   Physical Exam  amb wf nad   09/05/2016  215   07/14/16 207 lb 12.8 oz (94.3 kg)  06/25/16 211 lb 3.2 oz (95.8 kg)  06/16/16 202 lb 9.6 oz (91.9 kg)    Vital signs reviewed - Note on arrival 02 sats  97% on RA     HEENT: nl dentition, turbinates, and oropharynx. Nl external ear canals without cough reflex   NECK :  without JVD/Nodes/TM/ nl carotid upstrokes bilaterally   LUNGS: no acc muscle use,  Nl contour chest which is clear to A and P bilaterally without cough on insp or exp maneuvers   CV:  RRR  no s3 or murmur or increase in P2, nad no edema   ABD:  soft and nontender with nl inspiratory excursion in the supine position. No bruits or organomegaly  appreciated, bowel sounds nl  MS:  Nl gait/ ext warm without deformities, calf tenderness, cyanosis or clubbing No obvious joint restrictions   SKIN: warm and dry without lesions    NEURO:  alert, approp, nl sensorium with  no motor or cerebellar deficits apparent.    . I personally reviewed images and agree with radiology impression as follows:  CXR:   06/14/16 No active cardiopulmonary disease.        Assessment & Plan:

## 2016-09-06 NOTE — Assessment & Plan Note (Addendum)
Spirometry 07/24/2016  FEV1 2.06 (64%)  Ratio 64 p am spiriva  - 07/24/2016   try off spiriva dpi and on symbicort 160    - 09/05/2016  After extensive coaching HFA effectiveness =    90%   She is clinically doing great on just symbicort despite still smoking so nothing else to offer here other than to reinforce importance of d/c cigs (see separate a/p)   Pulmonary f/u is prn   I had an extended summarydiscussion with the patient reviewing all relevant studies completed to date and  lasting 15 to 20 minutes of a 25 minute final f/u office visit    Each maintenance medication was reviewed in detail including most importantly the difference between maintenance and prns and under what circumstances the prns are to be triggered using an action plan format that is not reflected in the computer generated alphabetically organized AVS.    Please see AVS for specific instructions unique to this visit that I personally wrote and verbalized to the the pt in detail and then reviewed with pt  by my nurse highlighting any  changes in therapy recommended at today's visit to their plan of care.

## 2016-09-06 NOTE — Assessment & Plan Note (Signed)
>   3 min I took an extended  opportunity with this patient to outline the consequences of continued cigarette use  in airway disorders based on all the data we have from the multiple national lung health studies (perfomed over decades at millions of dollars in cost)  indicating that smoking cessation, not choice of inhalers or physicians, is the most important aspect of her care.    Can't tolerate e cigs/ on celexa so using chantix may be problematic > Follow up per Primary Care planned

## 2016-09-08 ENCOUNTER — Ambulatory Visit (INDEPENDENT_AMBULATORY_CARE_PROVIDER_SITE_OTHER): Payer: Medicare HMO | Admitting: Pediatrics

## 2016-09-08 ENCOUNTER — Encounter: Payer: Self-pay | Admitting: Pediatrics

## 2016-09-08 VITALS — BP 116/68 | HR 65 | Temp 99.0°F | Ht 70.5 in | Wt 215.4 lb

## 2016-09-08 DIAGNOSIS — K219 Gastro-esophageal reflux disease without esophagitis: Secondary | ICD-10-CM

## 2016-09-08 DIAGNOSIS — F17210 Nicotine dependence, cigarettes, uncomplicated: Secondary | ICD-10-CM | POA: Diagnosis not present

## 2016-09-08 DIAGNOSIS — G35 Multiple sclerosis: Secondary | ICD-10-CM

## 2016-09-08 DIAGNOSIS — J449 Chronic obstructive pulmonary disease, unspecified: Secondary | ICD-10-CM

## 2016-09-08 DIAGNOSIS — Z6830 Body mass index (BMI) 30.0-30.9, adult: Secondary | ICD-10-CM

## 2016-09-08 DIAGNOSIS — F411 Generalized anxiety disorder: Secondary | ICD-10-CM | POA: Diagnosis not present

## 2016-09-08 DIAGNOSIS — D518 Other vitamin B12 deficiency anemias: Secondary | ICD-10-CM

## 2016-09-08 MED ORDER — VARENICLINE TARTRATE 0.5 MG X 11 & 1 MG X 42 PO MISC: ORAL | 0 refills | Status: DC

## 2016-09-08 NOTE — Progress Notes (Signed)
  Subjective:   Patient ID: [name], female    DOB: [birth year], 61 y.o.   MRN: [id number] CC: Follow-up (6 month) multiple med problems  HPI: [name] is a 61 y.o. female presenting for Follow-up (6 month)  Feeling well Last time used albuterol was several weeks Walking again, now walking every day Mom in nursing home with dementia On symbicort now twice a day, takes regularly  MS: last seen by neurology 5 yrs ago No recent flares Was started on gabapentin for neuropathy L side was paralyzed when diagnosed with MS No sensitivity/minimal feeling L hand Strength pretty good on the L side  Meeting with [name] every 2 weeks for weight loss Very pleased with the progrma, gained weight with holidays  GER: takes med daily, starts to notice symptoms if she misses any doses  Has been successful with chantix in the past Quit for up to 2 yrs, wants to try again  Mood and anxiety has been better on citalopram  Relevant past medical, surgical, family and social history reviewed. Allergies and medications reviewed and updated. History  Smoking Status  . Current Every Day Smoker  . Packs/day: 0.50  . Years: 40.00  . Types: Cigarettes  Smokeless Tobacco  . Never Used    Comment: 3/4 pack a day sicne age 36   ROS: Per HPI   Objective:    BP 116/68   Pulse 65   Temp 99 F (37.2 C) (Oral)   Ht 5' 10.5" (1.791 m)   Wt 215 lb 6.4 oz (97.7 kg)   BMI 30.47 kg/m   Wt Readings from Last 3 Encounters:  09/08/16 215 lb 6.4 oz (97.7 kg)  09/05/16 215 lb (97.5 kg)  07/14/16 207 lb 12.8 oz (94.3 kg)    Gen: NAD, alert, cooperative with exam, NCAT EYES: EOMI, no conjunctival injection, or no icterus ENT:  TMs dull gray b/l, OP without erythema LYMPH: no cervical LAD CV: NRRR, normal S1/S2, no murmur, distal pulses 2+ b/l Resp: CTABL, no wheezes, normal WOB Abd: +BS, soft, NTND.  Ext: No edema, warm Neuro: Alert and oriented  Assessment & Plan:  [name] was seen  today for follow-up.  Diagnoses and all orders for this visit:  COPD  GOLD II/ AB Cont symbicort No recent need for albuterol Trying to stop smoking  BMI 30.0-30.9,adult In class with [name] for weight loss Has been very helpful per pt, lots of weight gain with holidays Stress eats, mother has been sick  Cigarette smoker Trial of below Pick quit date, start two weeks before Let me know if working, will send in month continuation pack -     varenicline (CHANTIX STARTING MONTH PAK) 0.5 MG X 11 & 1 MG X 42 tablet; Take one 0.5 mg tablet by mouth once daily for 3 days, then increase to one 0.5 mg tablet twice daily for 4 days, then increase to one 1 mg tablet twice daily.  Gastroesophageal reflux disease without esophagitis Stable as long as on PPI  Multiple sclerosis (HCC) No recent symptoms or flares Follows with neurology prn, last time was 5 yrs ago  Other vitamin B12 deficiency anemia Cont B12 shots, recheck level next visit  Generalized anxiety disorder Cont citalopram, symptoms stable  Follow up plan: Return in about 6 months (around 03/08/2017). Assunta Found, MD [place]

## 2016-09-12 NOTE — Telephone Encounter (Signed)
Patient has been seen since phone call this encounter will now be closed 

## 2016-09-15 ENCOUNTER — Other Ambulatory Visit: Payer: Self-pay | Admitting: Pediatrics

## 2016-09-15 DIAGNOSIS — G629 Polyneuropathy, unspecified: Secondary | ICD-10-CM

## 2016-09-15 DIAGNOSIS — F411 Generalized anxiety disorder: Secondary | ICD-10-CM

## 2016-09-29 ENCOUNTER — Other Ambulatory Visit: Payer: Self-pay | Admitting: Pediatrics

## 2016-09-29 DIAGNOSIS — K219 Gastro-esophageal reflux disease without esophagitis: Secondary | ICD-10-CM

## 2016-10-02 ENCOUNTER — Ambulatory Visit (INDEPENDENT_AMBULATORY_CARE_PROVIDER_SITE_OTHER): Payer: Medicare HMO | Admitting: *Deleted

## 2016-10-02 DIAGNOSIS — D518 Other vitamin B12 deficiency anemias: Secondary | ICD-10-CM

## 2016-10-02 DIAGNOSIS — E538 Deficiency of other specified B group vitamins: Secondary | ICD-10-CM | POA: Diagnosis not present

## 2016-10-02 NOTE — Progress Notes (Signed)
Vit B12 inj given Pt tolerated well 

## 2016-10-31 ENCOUNTER — Ambulatory Visit (INDEPENDENT_AMBULATORY_CARE_PROVIDER_SITE_OTHER): Payer: Medicare HMO | Admitting: *Deleted

## 2016-10-31 DIAGNOSIS — D518 Other vitamin B12 deficiency anemias: Secondary | ICD-10-CM

## 2016-10-31 DIAGNOSIS — E538 Deficiency of other specified B group vitamins: Secondary | ICD-10-CM | POA: Diagnosis not present

## 2016-10-31 NOTE — Progress Notes (Signed)
Pt given Vit B12 inj Tolerated well 

## 2016-11-13 ENCOUNTER — Other Ambulatory Visit: Payer: Self-pay | Admitting: Pediatrics

## 2016-11-13 DIAGNOSIS — G629 Polyneuropathy, unspecified: Secondary | ICD-10-CM

## 2016-11-13 DIAGNOSIS — F411 Generalized anxiety disorder: Secondary | ICD-10-CM

## 2016-12-02 ENCOUNTER — Ambulatory Visit (INDEPENDENT_AMBULATORY_CARE_PROVIDER_SITE_OTHER): Payer: Medicare HMO | Admitting: *Deleted

## 2016-12-02 DIAGNOSIS — E538 Deficiency of other specified B group vitamins: Secondary | ICD-10-CM | POA: Diagnosis not present

## 2016-12-02 DIAGNOSIS — D518 Other vitamin B12 deficiency anemias: Secondary | ICD-10-CM

## 2016-12-02 NOTE — Progress Notes (Signed)
Pt given Vit B12 inj Tolerated well 

## 2017-01-02 ENCOUNTER — Ambulatory Visit: Payer: Medicare HMO

## 2017-01-02 ENCOUNTER — Encounter: Payer: Self-pay | Admitting: Physician Assistant

## 2017-01-02 ENCOUNTER — Ambulatory Visit (INDEPENDENT_AMBULATORY_CARE_PROVIDER_SITE_OTHER): Payer: Medicare HMO | Admitting: Physician Assistant

## 2017-01-02 VITALS — BP 88/62 | HR 77 | Temp 99.2°F | Ht 70.5 in | Wt 222.0 lb

## 2017-01-02 DIAGNOSIS — G35 Multiple sclerosis: Secondary | ICD-10-CM

## 2017-01-02 DIAGNOSIS — N3942 Incontinence without sensory awareness: Secondary | ICD-10-CM

## 2017-01-02 DIAGNOSIS — R7989 Other specified abnormal findings of blood chemistry: Secondary | ICD-10-CM | POA: Diagnosis not present

## 2017-01-02 DIAGNOSIS — I959 Hypotension, unspecified: Secondary | ICD-10-CM | POA: Diagnosis not present

## 2017-01-02 DIAGNOSIS — E538 Deficiency of other specified B group vitamins: Secondary | ICD-10-CM

## 2017-01-02 NOTE — Progress Notes (Signed)
BP (!) 88/62   Pulse 77   Temp 99.2 F (37.3 C) (Oral)   Ht 5' 10.5" (1.791 m)   Wt 222 lb (100.7 kg)   BMI 31.40 kg/m    Subjective:    Patient ID: [name], female    DOB: 01-31-1956, 61 y.o.   MRN: [id number]  HPI: [name] is a 61 y.o. female presenting on 01/02/2017 for Tremors; Dizziness; Tingling; and Nocturnal Enuresis  She was a patient with Dr. Bjorn Loser in [facility]. He was retired and would like to go to [place]. It has been several years since she saw him because her MS symptoms were greatly controlled. In the past few weeks she has had increasing neurological symptoms. He has had tingling down appendages, especially the left arm.  Also with weakness in legs. There has been an episode of nocturnal enuresis.  We will get a new referral for neuro.  Relevant past medical, surgical, family and social history reviewed and updated as indicated. Allergies and medications reviewed and updated.  Past Medical History:  Diagnosis Date  . Anxiety   . Arthritis   . Cancer ([place])    SKIN / "BORDERLINE CERVICAL CANCER"  . COPD (chronic obstructive pulmonary disease) ([place])   . GERD (gastroesophageal reflux disease)   . History of skin cancer   . Mixed hyperlipidemia   . Multiple sclerosis ([place])   . Neuromuscular disorder (HCC)    MIGRAINES  . Neuropathy    FEET  . PONV (postoperative nausea and vomiting)   . Pre-diabetes   . Psoriasis   . Weakness of left side of body    DUE TO MS    Past Surgical History:  Procedure Laterality Date  . ABDOMINAL HYSTERECTOMY    . CHOLECYSTECTOMY    . COLONOSCOPY N/A 08/02/2015   Procedure: COLONOSCOPY;  Surgeon: Rogene Houston, MD;  Location: [facility];  Service: Endoscopy;  Laterality: N/A;  12:00-moved to 1220 Ann to notify pt  . KNEE ARTHROSCOPY     RT  . Left breast lumpectomy    . Left carpal tunnel release    . PARTIAL KNEE ARTHROPLASTY Left 01/22/2015   Procedure: LEFT KNEE MEDIAL UNICOMPARTMENTAL KNEE;   Surgeon: Gaynelle Arabian, MD;  Location: [facility] ORS;  Service: Orthopedics;  Laterality: Left;  . Right total knee replacement  2010  . SHOULDER SURGERY     RT  . Sinus cyst removal    . TOTAL KNEE REVISION  05/05/2012   Procedure: TOTAL KNEE REVISION;  Surgeon: Gearlean Alf, MD;  Location: [facility];  Service: Orthopedics;  Laterality: Right;  . WRIST SURGERY     rt    Review of Systems  Constitutional: Negative for activity change, fatigue and fever.  HENT: Negative.   Eyes: Negative.   Respiratory: Negative.  Negative for cough.   Cardiovascular: Negative.  Negative for chest pain.  Gastrointestinal: Negative.  Negative for abdominal pain.  Endocrine: Negative.   Genitourinary: Negative.  Negative for dysuria.  Musculoskeletal: Negative.   Skin: Negative.   Neurological: Positive for dizziness, tremors, weakness, light-headedness and numbness. Negative for seizures, syncope and speech difficulty.    Allergies as of 01/02/2017      Reactions   Fenofibrate    unknown   Pravastatin Other (See Comments)   "flu like symptoms"    Penicillins Other (See Comments)   Mouth gets blisters      Medication List       Accurate as  of 01/02/17 10:12 PM. Always use your most recent med list.          albuterol 108 (90 Base) MCG/ACT inhaler Commonly known as:  VENTOLIN HFA Inhale 2 puffs into the lungs every 6 (six) hours as needed. For shortness of breath   albuterol (2.5 MG/3ML) 0.083% nebulizer solution Commonly known as:  PROVENTIL Take 3 mLs (2.5 mg total) by nebulization every 6 (six) hours as needed for wheezing or shortness of breath.   budesonide-formoterol 160-4.5 MCG/ACT inhaler Commonly known as:  SYMBICORT Take 2 puffs first thing in am and then another 2 puffs about 12 hours later.   cholecalciferol 1000 units tablet Commonly known as:  VITAMIN D Take 1,000 Units by mouth daily.   citalopram 20 MG tablet Commonly known as:  CELEXA TAKE ONE (1) TABLET EACH DAY     cycloSPORINE 0.05 % ophthalmic emulsion Commonly known as:  RESTASIS 1 drop 2 (two) times daily.   furosemide 20 MG tablet Commonly known as:  LASIX Take 1 tablet (20 mg total) by mouth daily. As needed   gabapentin 600 MG tablet Commonly known as:  NEURONTIN TAKE ONE TABLET FOUR TIMES DAILY   Melatonin 10 MG Tabs Take 1 tablet by mouth at bedtime.   nystatin 100000 UNIT/ML suspension Commonly known as:  MYCOSTATIN Take 5 mLs (500,000 Units total) by mouth 4 (four) times daily.   pantoprazole 40 MG tablet Commonly known as:  PROTONIX TAKE ONE (1) TABLET EACH DAY   triamcinolone 0.025 % ointment Commonly known as:  KENALOG Apply 1 application topically 2 (two) times daily.   varenicline 0.5 MG X 11 & 1 MG X 42 tablet Commonly known as:  CHANTIX STARTING MONTH PAK Take one 0.5 mg tablet by mouth once daily for 3 days, then increase to one 0.5 mg tablet twice daily for 4 days, then increase to one 1 mg tablet twice daily.          Objective:    BP (!) 88/62   Pulse 77   Temp 99.2 F (37.3 C) (Oral)   Ht 5' 10.5" (1.791 m)   Wt 222 lb (100.7 kg)   BMI 31.40 kg/m   Allergies  Allergen Reactions  . Fenofibrate     unknown  . Pravastatin Other (See Comments)    "flu like symptoms"   . Penicillins Other (See Comments)    Mouth gets blisters    Physical Exam  Constitutional: She is oriented to person, place, and time. She appears well-developed and well-nourished.  HENT:  Head: Normocephalic and atraumatic.  Right Ear: Tympanic membrane, external ear and ear canal normal.  Left Ear: Tympanic membrane, external ear and ear canal normal.  Nose: Nose normal. No rhinorrhea.  Mouth/Throat: Oropharynx is clear and moist and mucous membranes are normal. No oropharyngeal exudate or posterior oropharyngeal erythema.  Eyes: Conjunctivae and EOM are normal. Pupils are equal, round, and reactive to light.  Neck: Normal range of motion. Neck supple.  Cardiovascular:  Normal rate, regular rhythm, normal heart sounds and intact distal pulses.   Pulmonary/Chest: Effort normal and breath sounds normal.  Abdominal: Soft. Bowel sounds are normal.  Neurological: She is alert and oriented to person, place, and time. She has normal reflexes. She displays no tremor. No sensory deficit. She exhibits abnormal muscle tone. She displays no seizure activity.  Skin: Skin is warm and dry. No rash noted.  Psychiatric: She has a normal mood and affect. Her behavior is normal. Judgment and thought  content normal.        Assessment & Plan:   1. Multiple sclerosis ([place]) - CBC with Differential/Platelet - CMP14+EGFR - Sedimentation rate - Ambulatory referral to Neurology  2. Hypotension, unspecified hypotension type  3. Urinary incontinence without sensory awareness   Current Outpatient Prescriptions:  .  albuterol (PROVENTIL) (2.5 MG/3ML) 0.083% nebulizer solution, Take 3 mLs (2.5 mg total) by nebulization every 6 (six) hours as needed for wheezing or shortness of breath., Disp: 150 mL, Rfl: 1 .  albuterol (VENTOLIN HFA) 108 (90 Base) MCG/ACT inhaler, Inhale 2 puffs into the lungs every 6 (six) hours as needed. For shortness of breath, Disp: 1 Inhaler, Rfl: 1 .  budesonide-formoterol (SYMBICORT) 160-4.5 MCG/ACT inhaler, Take 2 puffs first thing in am and then another 2 puffs about 12 hours later., Disp: 1 Inhaler, Rfl: 11 .  cholecalciferol (VITAMIN D) 1000 units tablet, Take 1,000 Units by mouth daily., Disp: , Rfl:  .  citalopram (CELEXA) 20 MG tablet, TAKE ONE (1) TABLET EACH DAY, Disp: 90 tablet, Rfl: 1 .  cycloSPORINE (RESTASIS) 0.05 % ophthalmic emulsion, 1 drop 2 (two) times daily., Disp: , Rfl:  .  furosemide (LASIX) 20 MG tablet, Take 1 tablet (20 mg total) by mouth daily. As needed, Disp: 30 tablet, Rfl: 6 .  gabapentin (NEURONTIN) 600 MG tablet, TAKE ONE TABLET FOUR TIMES DAILY, Disp: 360 tablet, Rfl: 1 .  Melatonin 10 MG TABS, Take 1 tablet by mouth at  bedtime., Disp: , Rfl:  .  nystatin (MYCOSTATIN) 100000 UNIT/ML suspension, Take 5 mLs (500,000 Units total) by mouth 4 (four) times daily., Disp: 60 mL, Rfl: 0 .  pantoprazole (PROTONIX) 40 MG tablet, TAKE ONE (1) TABLET EACH DAY, Disp: 30 tablet, Rfl: 4 .  triamcinolone (KENALOG) 0.025 % ointment, Apply 1 application topically 2 (two) times daily., Disp: 80 g, Rfl: 3 .  varenicline (CHANTIX STARTING MONTH PAK) 0.5 MG X 11 & 1 MG X 42 tablet, Take one 0.5 mg tablet by mouth once daily for 3 days, then increase to one 0.5 mg tablet twice daily for 4 days, then increase to one 1 mg tablet twice daily. (Patient not taking: Reported on 01/02/2017), Disp: 53 tablet, Rfl: 0  Current Facility-Administered Medications:  .  cyanocobalamin ((VITAMIN B-12)) injection 1,000 mcg, 1,000 mcg, Intramuscular, Q30 days, [name], MD, 1,000 mcg at 01/02/17 1634  Continue all other maintenance medications as listed above.  Follow up plan: Return if symptoms worsen or fail to improve, for follow with PCP soon.  Educational handout given for hypotension  Terald Sleeper PA-C [facility] [address]  [place], [place] [address] [phone]   01/02/2017, 10:12 PM

## 2017-01-02 NOTE — Patient Instructions (Signed)
Hypotension As your heart beats, it forces blood through your body. This force is called blood pressure. If you have hypotension, you have low blood pressure. When your blood pressure is too low, you may not get enough blood to your brain. You may feel weak, feel light-headed, have a fast heartbeat, or even pass out (faint). Follow these instructions at home: Eating and drinking  Drink enough fluids to keep your pee (urine) clear or pale yellow.  Eat a healthy diet, and follow instructions from your doctor about eating or drinking restrictions. A healthy diet includes: ? Fresh fruits and vegetables. ? Whole grains. ? Low-fat (lean) meats. ? Low-fat dairy products.  Eat extra salt only as told. Do not add extra salt to your diet unless your doctor tells you to.  Eat small meals often.  Avoid standing up quickly after you eat. Medicines  Take over-the-counter and prescription medicines only as told by your doctor. ? Follow instructions from your doctor about changing how much you take (the dosage) of your medicines, if this applies. ? Do not stop or change your medicine on your own. General instructions  Wear compression stockings as told by your doctor.  Get up slowly from lying down or sitting.  Avoid hot showers and a lot of heat as told by your doctor.  Return to your normal activities as told by your doctor. Ask what activities are safe for you.  Do not use any products that contain nicotine or tobacco, such as cigarettes and e-cigarettes. If you need help quitting, ask your doctor.  Keep all follow-up visits as told by your doctor. This is important. Contact a doctor if:  You throw up (vomit).  You have watery poop (diarrhea).  You have a fever for more than 2-3 days.  You feel more thirsty than normal.  You feel weak and tired. Get help right away if:  You have chest pain.  You have a fast or irregular heartbeat.  You lose feeling (get numbness) in any part  of your body.  You cannot move your arms or your legs.  You have trouble talking.  You get sweaty or feel light-headed.  You faint.  You have trouble breathing.  You have trouble staying awake.  You feel confused. This information is not intended to replace advice given to you by your health care provider. Make sure you discuss any questions you have with your health care provider. Document Released: 10/22/2009 Document Revised: 04/15/2016 Document Reviewed: 04/15/2016 Elsevier Interactive Patient Education  2017 Elsevier Inc.   

## 2017-01-03 LAB — CMP14+EGFR
A/G RATIO: 1.9 (ref 1.2–2.2)
ALT: 12 IU/L (ref 0–32)
AST: 15 IU/L (ref 0–40)
Albumin: 3.8 g/dL (ref 3.5–5.5)
Alkaline Phosphatase: 64 IU/L (ref 39–117)
BILIRUBIN TOTAL: 0.3 mg/dL (ref 0.0–1.2)
BUN/Creatinine Ratio: 12 (ref 9–23)
BUN: 13 mg/dL (ref 6–24)
CHLORIDE: 102 mmol/L (ref 96–106)
CO2: 26 mmol/L (ref 18–29)
Calcium: 8.7 mg/dL (ref 8.7–10.2)
Creatinine, Ser: 1.09 mg/dL — ABNORMAL HIGH (ref 0.57–1.00)
GFR, EST AFRICAN AMERICAN: 65 mL/min/{1.73_m2} (ref >59)
GFR, EST NON AFRICAN AMERICAN: 56 mL/min/{1.73_m2} — AB (ref >59)
GLOBULIN, TOTAL: 2 g/dL (ref 1.5–4.5)
Glucose: 117 mg/dL — ABNORMAL HIGH (ref 65–99)
POTASSIUM: 3.8 mmol/L (ref 3.5–5.2)
SODIUM: 141 mmol/L (ref 134–144)
Total Protein: 5.8 g/dL — ABNORMAL LOW (ref 6.0–8.5)

## 2017-01-03 LAB — CBC WITH DIFFERENTIAL/PLATELET
Basophils Absolute: 0.1 10*3/uL (ref 0.0–0.2)
Basos: 1 %
EOS (ABSOLUTE): 0.1 10*3/uL (ref 0.0–0.4)
EOS: 1 %
HEMATOCRIT: 37.9 % (ref 34.0–46.6)
Hemoglobin: 12.3 g/dL (ref 11.1–15.9)
IMMATURE GRANS (ABS): 0 10*3/uL (ref 0.0–0.1)
IMMATURE GRANULOCYTES: 0 %
Lymphocytes Absolute: 3.2 10*3/uL — ABNORMAL HIGH (ref 0.7–3.1)
Lymphs: 36 %
MCH: 28.3 pg (ref 26.6–33.0)
MCHC: 32.5 g/dL (ref 31.5–35.7)
MCV: 87 fL (ref 79–97)
MONOS ABS: 0.4 10*3/uL (ref 0.1–0.9)
Monocytes: 4 %
NEUTROS ABS: 5.2 10*3/uL (ref 1.4–7.0)
NEUTROS PCT: 58 %
PLATELETS: 224 10*3/uL (ref 150–379)
RBC: 4.35 x10E6/uL (ref 3.77–5.28)
RDW: 13.4 % (ref 12.3–15.4)
WBC: 9 10*3/uL (ref 3.4–10.8)

## 2017-01-03 LAB — SEDIMENTATION RATE: Sed Rate: 3 mm/hr (ref 0–40)

## 2017-01-06 NOTE — Addendum Note (Signed)
Addended by: Thana Ates on: 01/06/2017 09:53 AM   Modules accepted: Orders

## 2017-01-07 ENCOUNTER — Encounter: Payer: Self-pay | Admitting: Neurology

## 2017-01-14 ENCOUNTER — Encounter: Payer: Self-pay | Admitting: Family Medicine

## 2017-01-14 ENCOUNTER — Ambulatory Visit (INDEPENDENT_AMBULATORY_CARE_PROVIDER_SITE_OTHER): Payer: Medicare HMO | Admitting: Family Medicine

## 2017-01-14 VITALS — BP 108/58 | HR 61 | Temp 98.7°F | Ht 70.5 in | Wt 222.0 lb

## 2017-01-14 DIAGNOSIS — J440 Chronic obstructive pulmonary disease with acute lower respiratory infection: Secondary | ICD-10-CM | POA: Diagnosis not present

## 2017-01-14 DIAGNOSIS — R7989 Other specified abnormal findings of blood chemistry: Secondary | ICD-10-CM | POA: Diagnosis not present

## 2017-01-14 DIAGNOSIS — J209 Acute bronchitis, unspecified: Secondary | ICD-10-CM

## 2017-01-14 LAB — CMP14+EGFR
ALK PHOS: 67 IU/L (ref 39–117)
ALT: 11 IU/L (ref 0–32)
AST: 14 IU/L (ref 0–40)
Albumin/Globulin Ratio: 2.1 (ref 1.2–2.2)
Albumin: 4 g/dL (ref 3.5–5.5)
BUN/Creatinine Ratio: 14 (ref 9–23)
BUN: 12 mg/dL (ref 6–24)
Bilirubin Total: 0.4 mg/dL (ref 0.0–1.2)
CO2: 27 mmol/L (ref 18–29)
CREATININE: 0.88 mg/dL (ref 0.57–1.00)
Calcium: 9.3 mg/dL (ref 8.7–10.2)
Chloride: 103 mmol/L (ref 96–106)
GFR calc Af Amer: 84 mL/min/{1.73_m2} (ref >59)
GFR calc non Af Amer: 73 mL/min/{1.73_m2} (ref >59)
GLOBULIN, TOTAL: 1.9 g/dL (ref 1.5–4.5)
Glucose: 105 mg/dL — ABNORMAL HIGH (ref 65–99)
Potassium: 4.7 mmol/L (ref 3.5–5.2)
SODIUM: 142 mmol/L (ref 134–144)
Total Protein: 5.9 g/dL — ABNORMAL LOW (ref 6.0–8.5)

## 2017-01-14 MED ORDER — CEFDINIR 300 MG PO CAPS: 300.0000 mg | ORAL_CAPSULE | Freq: Two times a day (BID) | ORAL | 0 refills | Status: DC

## 2017-01-14 NOTE — Progress Notes (Signed)
BP (!) 108/58   Pulse 61   Temp 98.7 F (37.1 C) (Oral)   Ht 5' 10.5" (1.791 m)   Wt 222 lb (100.7 kg)   BMI 31.40 kg/m    Subjective:    Patient ID: [name], female    DOB: Apr 17, 1956, 61 y.o.   MRN: [id number]  HPI: [name] is a 61 y.o. female presenting on 01/14/2017 for Abdominal Pain (began today, loose stool) and Sinus drainage, sore throat   HPI Sore throat and sinus drainage Patient has been having sore throat and sinus drainage has been going on for the past few days. The abdominal pain and nausea just began today, she's been having a lot of drainage down the back of her throat. She denies any nausea or vomiting but this had an episode of diarrhea today. She does complain of sinus pressure and hoarseness in her throat and pain in the left side of her throat as well. She uses her Symbicort all the time but has not used anything else for this just yet. She denies any fevers or chills or shortness of breath or wheezing.  Relevant past medical, surgical, family and social history reviewed and updated as indicated. Interim medical history since our last visit reviewed. Allergies and medications reviewed and updated.  Review of Systems  Constitutional: Negative for chills and fever.  HENT: Positive for congestion, postnasal drip, rhinorrhea, sinus pressure, sneezing and sore throat. Negative for ear discharge and ear pain.   Eyes: Negative for pain, redness and visual disturbance.  Respiratory: Positive for cough. Negative for chest tightness and shortness of breath.   Cardiovascular: Negative for chest pain and leg swelling.  Genitourinary: Negative for difficulty urinating and dysuria.  Musculoskeletal: Negative for back pain and gait problem.  Skin: Negative for rash.  Neurological: Negative for light-headedness and headaches.  Psychiatric/Behavioral: Negative for agitation and behavioral problems.  All other systems reviewed and are negative.   Per HPI  unless specifically indicated above        Objective:    BP (!) 108/58   Pulse 61   Temp 98.7 F (37.1 C) (Oral)   Ht 5' 10.5" (1.791 m)   Wt 222 lb (100.7 kg)   BMI 31.40 kg/m   Wt Readings from Last 3 Encounters:  01/14/17 222 lb (100.7 kg)  01/02/17 222 lb (100.7 kg)  09/08/16 215 lb 6.4 oz (97.7 kg)    Physical Exam  Constitutional: She is oriented to person, place, and time. She appears well-developed and well-nourished. No distress.  HENT:  Right Ear: Tympanic membrane, external ear and ear canal normal.  Left Ear: Tympanic membrane, external ear and ear canal normal.  Nose: Mucosal edema and rhinorrhea present. No epistaxis. Right sinus exhibits no maxillary sinus tenderness and no frontal sinus tenderness. Left sinus exhibits no maxillary sinus tenderness and no frontal sinus tenderness.  Mouth/Throat: Uvula is midline and mucous membranes are normal. Posterior oropharyngeal edema and posterior oropharyngeal erythema present. No oropharyngeal exudate or tonsillar abscesses.  Eyes: Conjunctivae are normal.  Cardiovascular: Normal rate, regular rhythm, normal heart sounds and intact distal pulses.   No murmur heard. Pulmonary/Chest: Effort normal and breath sounds normal. No respiratory distress. She has no wheezes. She has no rales.  Musculoskeletal: Normal range of motion. She exhibits no edema or tenderness.  Neurological: She is alert and oriented to person, place, and time. Coordination normal.  Skin: Skin is warm and dry. No rash noted. She is not diaphoretic.  Psychiatric: She has a normal mood and affect. Her behavior is normal.  Vitals reviewed.       Assessment & Plan:   Problem List Items Addressed This Visit    None    Visit Diagnoses    Acute bronchitis with COPD ([place])    -  Primary   Relevant Medications   cefdinir (OMNICEF) 300 MG capsule       Follow up plan: Return if symptoms worsen or fail to improve.  Counseling provided for all of the  vaccine components No orders of the defined types were placed in this encounter.   Caryl Pina, MD [facility] 01/14/2017, 11:13 AM

## 2017-01-27 ENCOUNTER — Other Ambulatory Visit: Payer: Self-pay | Admitting: Pediatrics

## 2017-01-27 DIAGNOSIS — K219 Gastro-esophageal reflux disease without esophagitis: Secondary | ICD-10-CM

## 2017-02-02 ENCOUNTER — Ambulatory Visit (INDEPENDENT_AMBULATORY_CARE_PROVIDER_SITE_OTHER): Payer: Medicare HMO | Admitting: *Deleted

## 2017-02-02 DIAGNOSIS — E538 Deficiency of other specified B group vitamins: Secondary | ICD-10-CM | POA: Diagnosis not present

## 2017-02-02 DIAGNOSIS — D518 Other vitamin B12 deficiency anemias: Secondary | ICD-10-CM | POA: Diagnosis not present

## 2017-02-12 ENCOUNTER — Emergency Department (HOSPITAL_COMMUNITY): Payer: Medicare HMO | Admitting: Anesthesiology

## 2017-02-12 ENCOUNTER — Observation Stay (HOSPITAL_COMMUNITY)
Admission: EM | Admit: 2017-02-12 | Discharge: 2017-02-13 | Disposition: A | Discharge status: Home / Self Care | Payer: Medicare HMO | Attending: General Surgery | Admitting: General Surgery

## 2017-02-12 ENCOUNTER — Encounter (HOSPITAL_COMMUNITY): Payer: Self-pay

## 2017-02-12 ENCOUNTER — Encounter (HOSPITAL_COMMUNITY)
Admission: EM | Disposition: A | Discharge status: Home / Self Care | Payer: Self-pay | Source: Home / Self Care | Attending: Emergency Medicine

## 2017-02-12 ENCOUNTER — Ambulatory Visit (INDEPENDENT_AMBULATORY_CARE_PROVIDER_SITE_OTHER): Payer: Medicare HMO

## 2017-02-12 ENCOUNTER — Emergency Department (HOSPITAL_COMMUNITY): Payer: Medicare HMO

## 2017-02-12 ENCOUNTER — Ambulatory Visit (HOSPITAL_COMMUNITY)
Admission: RE | Admit: 2017-02-12 | Discharge: 2017-02-12 | Disposition: A | Discharge status: Home / Self Care | Payer: Medicare HMO | Source: Ambulatory Visit | Attending: Pediatrics | Admitting: Pediatrics

## 2017-02-12 ENCOUNTER — Other Ambulatory Visit: Payer: Self-pay

## 2017-02-12 ENCOUNTER — Ambulatory Visit (INDEPENDENT_AMBULATORY_CARE_PROVIDER_SITE_OTHER): Payer: Medicare HMO | Admitting: Pediatrics

## 2017-02-12 ENCOUNTER — Encounter: Payer: Self-pay | Admitting: Pediatrics

## 2017-02-12 VITALS — BP 103/62 | HR 60 | Temp 97.2°F | Ht 70.5 in | Wt 221.4 lb

## 2017-02-12 DIAGNOSIS — K353 Acute appendicitis with localized peritonitis, without perforation or gangrene: Secondary | ICD-10-CM

## 2017-02-12 DIAGNOSIS — R001 Bradycardia, unspecified: Secondary | ICD-10-CM | POA: Diagnosis not present

## 2017-02-12 DIAGNOSIS — R1084 Generalized abdominal pain: Secondary | ICD-10-CM

## 2017-02-12 DIAGNOSIS — F17210 Nicotine dependence, cigarettes, uncomplicated: Secondary | ICD-10-CM | POA: Diagnosis not present

## 2017-02-12 DIAGNOSIS — G35 Multiple sclerosis: Secondary | ICD-10-CM | POA: Insufficient documentation

## 2017-02-12 DIAGNOSIS — J449 Chronic obstructive pulmonary disease, unspecified: Secondary | ICD-10-CM | POA: Insufficient documentation

## 2017-02-12 DIAGNOSIS — Z7951 Long term (current) use of inhaled steroids: Secondary | ICD-10-CM | POA: Diagnosis not present

## 2017-02-12 DIAGNOSIS — Z79899 Other long term (current) drug therapy: Secondary | ICD-10-CM | POA: Insufficient documentation

## 2017-02-12 DIAGNOSIS — Z96653 Presence of artificial knee joint, bilateral: Secondary | ICD-10-CM | POA: Insufficient documentation

## 2017-02-12 DIAGNOSIS — I700 Atherosclerosis of aorta: Secondary | ICD-10-CM | POA: Insufficient documentation

## 2017-02-12 DIAGNOSIS — J9811 Atelectasis: Secondary | ICD-10-CM | POA: Diagnosis not present

## 2017-02-12 DIAGNOSIS — K3580 Unspecified acute appendicitis: Secondary | ICD-10-CM | POA: Diagnosis present

## 2017-02-12 DIAGNOSIS — R197 Diarrhea, unspecified: Secondary | ICD-10-CM | POA: Diagnosis not present

## 2017-02-12 DIAGNOSIS — R109 Unspecified abdominal pain: Secondary | ICD-10-CM | POA: Diagnosis not present

## 2017-02-12 DIAGNOSIS — K219 Gastro-esophageal reflux disease without esophagitis: Secondary | ICD-10-CM | POA: Insufficient documentation

## 2017-02-12 HISTORY — PX: LAPAROSCOPIC APPENDECTOMY: SHX408

## 2017-02-12 LAB — CBC WITH DIFFERENTIAL/PLATELET
Basophils Absolute: 0.1 10*3/uL (ref 0.0–0.1)
Basophils Relative: 1 %
EOS PCT: 2 %
Eosinophils Absolute: 0.2 10*3/uL (ref 0.0–0.7)
HCT: 45.5 % (ref 36.0–46.0)
HEMOGLOBIN: 14.7 g/dL (ref 12.0–15.0)
Lymphocytes Relative: 30 %
Lymphs Abs: 3.3 10*3/uL (ref 0.7–4.0)
MCH: 28.8 pg (ref 26.0–34.0)
MCHC: 32.3 g/dL (ref 30.0–36.0)
MCV: 89.2 fL (ref 78.0–100.0)
MONOS PCT: 5 %
Monocytes Absolute: 0.6 10*3/uL (ref 0.1–1.0)
NEUTROS PCT: 62 %
Neutro Abs: 6.9 10*3/uL (ref 1.7–7.7)
PLATELETS: 224 10*3/uL (ref 150–400)
RBC: 5.1 MIL/uL (ref 3.87–5.11)
RDW: 13.8 % (ref 11.5–15.5)
WBC: 11 10*3/uL — AB (ref 4.0–10.5)

## 2017-02-12 LAB — LIPASE, BLOOD: LIPASE: 25 U/L (ref 11–51)

## 2017-02-12 LAB — COMPREHENSIVE METABOLIC PANEL
ALBUMIN: 4.2 g/dL (ref 3.5–5.0)
ALK PHOS: 70 U/L (ref 38–126)
ALT: 13 U/L — ABNORMAL LOW (ref 14–54)
ANION GAP: 13 (ref 5–15)
AST: 15 U/L (ref 15–41)
BUN: 11 mg/dL (ref 6–20)
CALCIUM: 8.7 mg/dL — AB (ref 8.9–10.3)
CHLORIDE: 98 mmol/L — AB (ref 101–111)
CO2: 23 mmol/L (ref 22–32)
Creatinine, Ser: 0.87 mg/dL (ref 0.44–1.00)
GFR calc Af Amer: >60 mL/min (ref >60)
GFR calc non Af Amer: >60 mL/min (ref >60)
GLUCOSE: 109 mg/dL — AB (ref 65–99)
POTASSIUM: 3.4 mmol/L — AB (ref 3.5–5.1)
SODIUM: 134 mmol/L — AB (ref 135–145)
Total Bilirubin: 0.6 mg/dL (ref 0.3–1.2)
Total Protein: 7.5 g/dL (ref 6.5–8.1)

## 2017-02-12 LAB — URINALYSIS, ROUTINE W REFLEX MICROSCOPIC
BACTERIA UA: NONE SEEN
BILIRUBIN URINE: NEGATIVE
Glucose, UA: NEGATIVE mg/dL
Hgb urine dipstick: NEGATIVE
KETONES UR: NEGATIVE mg/dL
NITRITE: NEGATIVE
Protein, ur: NEGATIVE mg/dL
Specific Gravity, Urine: 1.028 (ref 1.005–1.030)
pH: 7 (ref 5.0–8.0)

## 2017-02-12 SURGERY — APPENDECTOMY, LAPAROSCOPIC
Anesthesia: General

## 2017-02-12 MED ORDER — CIPROFLOXACIN IN D5W 400 MG/200ML IV SOLN
400.0000 mg | Freq: Once | INTRAVENOUS | Status: AC
  Administered 2017-02-12: 400 mg via INTRAVENOUS
  Filled 2017-02-12: qty 200

## 2017-02-12 MED ORDER — LACTATED RINGERS IV SOLN
INTRAVENOUS | Status: DC | PRN
  Administered 2017-02-12: 21:00:00 via INTRAVENOUS

## 2017-02-12 MED ORDER — DIPHENHYDRAMINE HCL 50 MG/ML IJ SOLN: 25.0000 mg | Freq: Four times a day (QID) | INTRAMUSCULAR | Status: DC | PRN

## 2017-02-12 MED ORDER — ALBUTEROL SULFATE (2.5 MG/3ML) 0.083% IN NEBU: 2.5000 mg | INHALATION_SOLUTION | Freq: Four times a day (QID) | RESPIRATORY_TRACT | Status: DC | PRN

## 2017-02-12 MED ORDER — ONDANSETRON 4 MG PO TBDP: 4.0000 mg | ORAL_TABLET | Freq: Four times a day (QID) | ORAL | Status: DC | PRN

## 2017-02-12 MED ORDER — FENTANYL CITRATE (PF) 250 MCG/5ML IJ SOLN
INTRAMUSCULAR | Status: AC
  Filled 2017-02-12: qty 5

## 2017-02-12 MED ORDER — PROPOFOL 10 MG/ML IV BOLUS
INTRAVENOUS | Status: AC
  Filled 2017-02-12: qty 40

## 2017-02-12 MED ORDER — ACETAMINOPHEN 650 MG RE SUPP: 650.0000 mg | Freq: Four times a day (QID) | RECTAL | Status: DC | PRN

## 2017-02-12 MED ORDER — FENTANYL CITRATE (PF) 100 MCG/2ML IJ SOLN
INTRAMUSCULAR | Status: AC
  Filled 2017-02-12: qty 2

## 2017-02-12 MED ORDER — MIDAZOLAM HCL 2 MG/2ML IJ SOLN
INTRAMUSCULAR | Status: AC
  Filled 2017-02-12: qty 4

## 2017-02-12 MED ORDER — HYDROCODONE-ACETAMINOPHEN 5-325 MG PO TABS: 1.0000 | ORAL_TABLET | ORAL | Status: DC | PRN

## 2017-02-12 MED ORDER — KETOROLAC TROMETHAMINE 30 MG/ML IJ SOLN
INTRAMUSCULAR | Status: AC
  Filled 2017-02-12: qty 1

## 2017-02-12 MED ORDER — SUCCINYLCHOLINE CHLORIDE 20 MG/ML IJ SOLN
INTRAMUSCULAR | Status: DC | PRN
  Administered 2017-02-12: 150 mg via INTRAVENOUS

## 2017-02-12 MED ORDER — PROPOFOL 10 MG/ML IV BOLUS
INTRAVENOUS | Status: DC | PRN
  Administered 2017-02-12: 120 mg via INTRAVENOUS

## 2017-02-12 MED ORDER — POVIDONE-IODINE 10 % EX OINT
TOPICAL_OINTMENT | CUTANEOUS | Status: AC
  Filled 2017-02-12: qty 1

## 2017-02-12 MED ORDER — FENTANYL CITRATE (PF) 100 MCG/2ML IJ SOLN
INTRAMUSCULAR | Status: DC | PRN
  Administered 2017-02-12 (×2): 50 ug via INTRAVENOUS

## 2017-02-12 MED ORDER — GLYCOPYRROLATE 0.2 MG/ML IJ SOLN
INTRAMUSCULAR | Status: AC
  Filled 2017-02-12: qty 3

## 2017-02-12 MED ORDER — METOCLOPRAMIDE HCL 5 MG/ML IJ SOLN
INTRAMUSCULAR | Status: DC | PRN
  Administered 2017-02-12: 10 mg via INTRAVENOUS

## 2017-02-12 MED ORDER — ACETAMINOPHEN 325 MG PO TABS: 650.0000 mg | ORAL_TABLET | Freq: Four times a day (QID) | ORAL | Status: DC | PRN

## 2017-02-12 MED ORDER — KETOROLAC TROMETHAMINE 30 MG/ML IJ SOLN
30.0000 mg | Freq: Once | INTRAMUSCULAR | Status: AC
  Administered 2017-02-12: 30 mg via INTRAVENOUS

## 2017-02-12 MED ORDER — LIDOCAINE HCL (PF) 1 % IJ SOLN
INTRAMUSCULAR | Status: AC
  Filled 2017-02-12: qty 5

## 2017-02-12 MED ORDER — LIDOCAINE HCL 1 % IJ SOLN
INTRAMUSCULAR | Status: DC | PRN
  Administered 2017-02-12: 25 mg via INTRADERMAL

## 2017-02-12 MED ORDER — ONDANSETRON HCL 4 MG/2ML IJ SOLN
4.0000 mg | Freq: Once | INTRAMUSCULAR | Status: DC
  Filled 2017-02-12: qty 2

## 2017-02-12 MED ORDER — ROCURONIUM BROMIDE 100 MG/10ML IV SOLN
INTRAVENOUS | Status: DC | PRN
  Administered 2017-02-12: 25 mg via INTRAVENOUS
  Administered 2017-02-12: 5 mg via INTRAVENOUS

## 2017-02-12 MED ORDER — GABAPENTIN 300 MG PO CAPS
600.0000 mg | ORAL_CAPSULE | Freq: Three times a day (TID) | ORAL | Status: DC
  Filled 2017-02-12: qty 2

## 2017-02-12 MED ORDER — IOPAMIDOL (ISOVUE-300) INJECTION 61%
INTRAVENOUS | Status: AC
  Administered 2017-02-12: 100 mL
  Filled 2017-02-12: qty 30

## 2017-02-12 MED ORDER — ONDANSETRON HCL 4 MG/2ML IJ SOLN
4.0000 mg | Freq: Once | INTRAMUSCULAR | Status: AC
  Administered 2017-02-12: 4 mg via INTRAVENOUS
  Filled 2017-02-12: qty 2

## 2017-02-12 MED ORDER — PANTOPRAZOLE SODIUM 40 MG PO TBEC
40.0000 mg | DELAYED_RELEASE_TABLET | Freq: Every day | ORAL | Status: DC
  Filled 2017-02-12: qty 1

## 2017-02-12 MED ORDER — SCOPOLAMINE 1 MG/3DAYS TD PT72
1.0000 | MEDICATED_PATCH | TRANSDERMAL | Status: DC
  Administered 2017-02-12: 1.5 mg via TRANSDERMAL
  Filled 2017-02-12: qty 1

## 2017-02-12 MED ORDER — MIDAZOLAM HCL 5 MG/5ML IJ SOLN
INTRAMUSCULAR | Status: DC | PRN
  Administered 2017-02-12 (×2): 2 mg via INTRAVENOUS

## 2017-02-12 MED ORDER — NEOSTIGMINE METHYLSULFATE 10 MG/10ML IV SOLN
INTRAVENOUS | Status: DC | PRN
  Administered 2017-02-12 (×2): 2 mg via INTRAVENOUS

## 2017-02-12 MED ORDER — SODIUM CHLORIDE 0.9 % IV SOLN
INTRAVENOUS | Status: DC
  Administered 2017-02-12 (×2): via INTRAVENOUS

## 2017-02-12 MED ORDER — METRONIDAZOLE IN NACL 5-0.79 MG/ML-% IV SOLN
500.0000 mg | Freq: Three times a day (TID) | INTRAVENOUS | Status: DC
  Administered 2017-02-13: 500 mg via INTRAVENOUS
  Filled 2017-02-12: qty 100

## 2017-02-12 MED ORDER — CITALOPRAM HYDROBROMIDE 20 MG PO TABS
20.0000 mg | ORAL_TABLET | Freq: Every day | ORAL | Status: DC
  Filled 2017-02-12: qty 1

## 2017-02-12 MED ORDER — ENOXAPARIN SODIUM 40 MG/0.4ML ~~LOC~~ SOLN
40.0000 mg | SUBCUTANEOUS | Status: DC
  Administered 2017-02-13: 40 mg via SUBCUTANEOUS
  Filled 2017-02-12: qty 0.4

## 2017-02-12 MED ORDER — HYDROMORPHONE HCL 1 MG/ML IJ SOLN
1.0000 mg | Freq: Once | INTRAMUSCULAR | Status: DC
Start: 2017-02-12 — End: 2017-02-12
  Filled 2017-02-12: qty 1

## 2017-02-12 MED ORDER — LORAZEPAM 2 MG/ML IJ SOLN: 0.5000 mg | INTRAMUSCULAR | Status: DC | PRN

## 2017-02-12 MED ORDER — SODIUM CHLORIDE 0.9 % IJ SOLN
INTRAMUSCULAR | Status: AC
  Filled 2017-02-12: qty 10

## 2017-02-12 MED ORDER — DEXAMETHASONE SODIUM PHOSPHATE 10 MG/ML IJ SOLN
INTRAMUSCULAR | Status: DC | PRN
  Administered 2017-02-12: 10 mg via INTRAVENOUS

## 2017-02-12 MED ORDER — METRONIDAZOLE IN NACL 5-0.79 MG/ML-% IV SOLN
500.0000 mg | Freq: Once | INTRAVENOUS | Status: AC
  Administered 2017-02-12: 500 mg via INTRAVENOUS
  Filled 2017-02-12: qty 100

## 2017-02-12 MED ORDER — SODIUM CHLORIDE 0.9 % IV BOLUS (SEPSIS)
500.0000 mL | Freq: Once | INTRAVENOUS | Status: AC
  Administered 2017-02-12: 500 mL via INTRAVENOUS

## 2017-02-12 MED ORDER — BUPIVACAINE HCL (PF) 0.5 % IJ SOLN
INTRAMUSCULAR | Status: AC
  Filled 2017-02-12: qty 30

## 2017-02-12 MED ORDER — LACTATED RINGERS IV SOLN
INTRAVENOUS | Status: DC
  Administered 2017-02-12: 23:00:00 via INTRAVENOUS

## 2017-02-12 MED ORDER — MORPHINE SULFATE (PF) 2 MG/ML IV SOLN: 2.0000 mg | INTRAVENOUS | Status: DC | PRN

## 2017-02-12 MED ORDER — GLYCOPYRROLATE 0.2 MG/ML IJ SOLN
INTRAMUSCULAR | Status: DC | PRN
  Administered 2017-02-12: 0.4 mg via INTRAVENOUS
  Administered 2017-02-12: 0.2 mg via INTRAVENOUS

## 2017-02-12 MED ORDER — ONDANSETRON HCL 4 MG/2ML IJ SOLN: 4.0000 mg | Freq: Four times a day (QID) | INTRAMUSCULAR | Status: DC | PRN

## 2017-02-12 MED ORDER — FUROSEMIDE 20 MG PO TABS
20.0000 mg | ORAL_TABLET | Freq: Every day | ORAL | Status: DC
  Filled 2017-02-12: qty 1

## 2017-02-12 MED ORDER — SUCCINYLCHOLINE CHLORIDE 20 MG/ML IJ SOLN
INTRAMUSCULAR | Status: AC
  Filled 2017-02-12: qty 1

## 2017-02-12 MED ORDER — CIPROFLOXACIN IN D5W 400 MG/200ML IV SOLN
400.0000 mg | Freq: Two times a day (BID) | INTRAVENOUS | Status: DC
  Administered 2017-02-13: 400 mg via INTRAVENOUS
  Filled 2017-02-12: qty 200

## 2017-02-12 MED ORDER — SCOPOLAMINE 1 MG/3DAYS TD PT72
MEDICATED_PATCH | TRANSDERMAL | Status: AC
  Filled 2017-02-12: qty 1

## 2017-02-12 MED ORDER — MOMETASONE FURO-FORMOTEROL FUM 200-5 MCG/ACT IN AERO
2.0000 | INHALATION_SPRAY | Freq: Two times a day (BID) | RESPIRATORY_TRACT | Status: DC
  Filled 2017-02-12: qty 8.8

## 2017-02-12 MED ORDER — SIMETHICONE 80 MG PO CHEW: 40.0000 mg | CHEWABLE_TABLET | Freq: Four times a day (QID) | ORAL | Status: DC | PRN

## 2017-02-12 MED ORDER — EPHEDRINE SULFATE 50 MG/ML IJ SOLN
INTRAMUSCULAR | Status: AC
  Filled 2017-02-12: qty 1

## 2017-02-12 MED ORDER — DIPHENHYDRAMINE HCL 25 MG PO CAPS: 25.0000 mg | ORAL_CAPSULE | Freq: Four times a day (QID) | ORAL | Status: DC | PRN

## 2017-02-12 MED ORDER — ROCURONIUM BROMIDE 50 MG/5ML IV SOLN
INTRAVENOUS | Status: AC
  Filled 2017-02-12: qty 1

## 2017-02-12 SURGICAL SUPPLY — 54 items
APL SRG 38 LTWT LNG FL B (MISCELLANEOUS) ×1
APPLICATOR ARISTA FLEXITIP XL (MISCELLANEOUS) ×2 IMPLANT
BAG HAMPER (MISCELLANEOUS) ×3 IMPLANT
BAG RETRIEVAL 10 (BASKET) ×1
BAG RETRIEVAL 10MM (BASKET) ×1
CHLORAPREP W/TINT 26ML (MISCELLANEOUS) ×3 IMPLANT
CLOTH BEACON ORANGE TIMEOUT ST (SAFETY) ×3 IMPLANT
COVER LIGHT HANDLE STERIS (MISCELLANEOUS) ×6 IMPLANT
CUTTER FLEX LINEAR 45M (STAPLE) ×3 IMPLANT
DECANTER SPIKE VIAL GLASS SM (MISCELLANEOUS) ×3 IMPLANT
ELECT REM PT RETURN 9FT ADLT (ELECTROSURGICAL) ×3
ELECTRODE REM PT RTRN 9FT ADLT (ELECTROSURGICAL) ×1 IMPLANT
EVACUATOR SMOKE 8.L (FILTER) ×3 IMPLANT
FORMALIN 10 PREFIL 120ML (MISCELLANEOUS) ×3 IMPLANT
GAUZE 2 PACKING MSP (GAUZE/BANDAGES/DRESSINGS) ×6 IMPLANT
GLOVE BIOGEL PI IND STRL 7.0 (GLOVE) ×2 IMPLANT
GLOVE BIOGEL PI INDICATOR 7.0 (GLOVE) ×8
GLOVE ECLIPSE 6.5 STRL STRAW (GLOVE) ×2 IMPLANT
GLOVE SURG SS PI 7.5 STRL IVOR (GLOVE) ×3 IMPLANT
GOWN STRL REUS W/ TWL XL LVL3 (GOWN DISPOSABLE) ×1 IMPLANT
GOWN STRL REUS W/TWL LRG LVL3 (GOWN DISPOSABLE) ×5 IMPLANT
GOWN STRL REUS W/TWL XL LVL3 (GOWN DISPOSABLE) ×3
HEMOSTAT ARISTA ABSORB 3G PWDR (MISCELLANEOUS) ×2 IMPLANT
INST SET LAPROSCOPIC AP (KITS) ×3 IMPLANT
IV NS IRRIG 3000ML ARTHROMATIC (IV SOLUTION) IMPLANT
KIT ROOM TURNOVER APOR (KITS) ×3 IMPLANT
MANIFOLD NEPTUNE II (INSTRUMENTS) ×3 IMPLANT
NDL INSUFFLATION 14GA 120MM (NEEDLE) ×1 IMPLANT
NEEDLE INSUFFLATION 14GA 120MM (NEEDLE) ×3 IMPLANT
NS IRRIG 1000ML POUR BTL (IV SOLUTION) ×3 IMPLANT
PACK LAP CHOLE LZT030E (CUSTOM PROCEDURE TRAY) ×3 IMPLANT
PAD ARMBOARD 7.5X6 YLW CONV (MISCELLANEOUS) ×3 IMPLANT
PENCIL HANDSWITCHING (ELECTRODE) ×1 IMPLANT
RELOAD 45 VASCULAR/THIN (ENDOMECHANICALS) IMPLANT
RELOAD STAPLE 45 2.5 WHT GRN (ENDOMECHANICALS) IMPLANT
RELOAD STAPLE 45 3.5 BLU ETS (ENDOMECHANICALS) IMPLANT
RELOAD STAPLE TA45 3.5 REG BLU (ENDOMECHANICALS) ×3 IMPLANT
SET BASIN LINEN APH (SET/KITS/TRAYS/PACK) ×3 IMPLANT
SET TUBE IRRIG SUCTION NO TIP (IRRIGATION / IRRIGATOR) IMPLANT
SHEARS HARMONIC ACE PLUS 36CM (ENDOMECHANICALS) ×3 IMPLANT
SPONGE GAUZE 2X2 12PLY UNSTER (GAUZE/BANDAGES/DRESSINGS) ×6 IMPLANT
SPONGE GAUZE 2X2 8PLY STER LF (GAUZE/BANDAGES/DRESSINGS) ×3
SPONGE GAUZE 2X2 8PLY STRL LF (GAUZE/BANDAGES/DRESSINGS) ×6 IMPLANT
STAPLER VISISTAT (STAPLE) ×3 IMPLANT
SUT VICRYL 0 UR6 27IN ABS (SUTURE) ×3 IMPLANT
SYS BAG RETRIEVAL 10MM (BASKET) ×1
SYSTEM BAG RETRIEVAL 10MM (BASKET) ×1 IMPLANT
TRAY FOLEY CATH SILVER 16FR (SET/KITS/TRAYS/PACK) ×3 IMPLANT
TROCAR ENDO BLADELESS 11MM (ENDOMECHANICALS) ×3 IMPLANT
TROCAR ENDO BLADELESS 12MM (ENDOMECHANICALS) ×3 IMPLANT
TROCAR XCEL NON-BLD 5MMX100MML (ENDOMECHANICALS) ×3 IMPLANT
TUBING INSUFFLATION (TUBING) ×3 IMPLANT
WARMER LAPAROSCOPE (MISCELLANEOUS) ×3 IMPLANT
YANKAUER SUCT 12FT TUBE ARGYLE (SUCTIONS) ×3 IMPLANT

## 2017-02-12 NOTE — ED Notes (Signed)
CRNA at bedside.

## 2017-02-12 NOTE — ED Notes (Signed)
Advised CRNA of pt's low BP with surgery and sickness with anesthesia.

## 2017-02-12 NOTE — ED Triage Notes (Signed)
Dr Rogene Houston notified of CT results

## 2017-02-12 NOTE — Anesthesia Preprocedure Evaluation (Addendum)
Anesthesia Evaluation  Patient identified by MRN, date of birth, ID band Patient awake    Reviewed: Allergy & Precautions, NPO status , Patient's Chart, lab work & pertinent test results, reviewed documented beta blocker date and time   History of Anesthesia Complications (+) PONV  Airway Mallampati: II  TM Distance: >3 FB Neck ROM: Full    Dental  (+) Teeth Intact   Pulmonary COPD, Current Smoker,     + decreased breath sounds      Cardiovascular Exercise Tolerance: Good  Rhythm:Regular Rate:Bradycardia     Neuro/Psych    GI/Hepatic GERD  Medicated,  Endo/Other    Renal/GU      Musculoskeletal   Abdominal (+)  Abdomen: soft. Bowel sounds: absent.  Peds  Hematology   Anesthesia Other Findings   Reproductive/Obstetrics                            Anesthesia Physical Anesthesia Plan  ASA: II and emergent  Anesthesia Plan: General   Post-op Pain Management:    Induction: Intravenous, Rapid sequence and Cricoid pressure planned  PONV Risk Score and Plan: Scopolamine patch - Pre-op, Ondansetron, Dexamethasone and Metaclopromide  Airway Management Planned: Oral ETT  Additional Equipment:   Intra-op Plan:   Post-operative Plan: Extubation in OR  Informed Consent: I have reviewed the patients History and Physical, chart, labs and discussed the procedure including the risks, benefits and alternatives for the proposed anesthesia with the patient or authorized representative who has indicated his/her understanding and acceptance.     Plan Discussed with: CRNA and Anesthesiologist  Anesthesia Plan Comments:         Anesthesia Quick Evaluation

## 2017-02-12 NOTE — Progress Notes (Signed)
  Subjective:   Patient ID: [name], female    DOB: May 02, 1956, 61 y.o.   MRN: [id number] CC: Abdominal Pain  HPI: [name] is a 61 y.o. female presenting for Abdominal Pain  Started 2 days ago Feels like cramping, everywhere, not one area, feels like it moves at times Normal bowel movements daily until yesterday, had a slightly hard stool, took a ducolax then 4am this morning had 6 bowel movements, loose, no blood in stools No nausea, no vomiting Appetite down because of pain No dysuria No change in eating habits  Yesterday didn't have much of an appetite, ate a few peanut butter crackers Drinking 6 bottles of water yesterday Pain not any worse  No acheyness, no fevers  Only thing that helps with pain is laying on R side Normal urination Not on BP meds hasnt recently needed lasix, pt says for months No lighteadedness when she stands up, has had in the past  No lightheadedness  Relevant past medical, surgical, family and social history reviewed. Allergies and medications reviewed and updated. History  Smoking Status  . Current Every Day Smoker  . Packs/day: 0.50  . Years: 40.00  . Types: Cigarettes  Smokeless Tobacco  . Never Used    Comment: 3/4 pack a day sicne age 54   ROS: Per HPI   Objective:    BP 103/62   Pulse 60   Temp (!) 97.2 F (36.2 C) (Oral)   Ht 5' 10.5" (1.791 m)   Wt 221 lb 6.4 oz (100.4 kg)   BMI 31.32 kg/m   Wt Readings from Last 3 Encounters:  02/12/17 221 lb 6.4 oz (100.4 kg)  01/14/17 222 lb (100.7 kg)  01/02/17 222 lb (100.7 kg)    Gen: NAD, alert, uncomfortable appearing, cooperative with exam, NCAT EYES: EOMI, no conjunctival injection, or no icterus ENT:  TMs pearly gray b/l, OP without erythema LYMPH: no cervical LAD CV: NRRR, normal S1/S2, no murmur, distal pulses 2+ b/l Resp: CTABL, no wheezes, normal WOB Abd: +BS, soft, mildly tender with palpation throughout, very tender RLQ, no rebound, no peritoneal signs.  no guarding or organomegaly Ext: No edema, warm Neuro: Alert and oriented  Assessment & Plan:  [name] was seen today for abdominal pain.  Diagnoses and all orders for this visit:  Generalized abdominal pain Abd xray non-specific, having regular bowel movements Will get labs, given tenderness RLQ will get CT scan Normal BP with repeat No symptoms other than abd pain Had similar episode in 102016 per pt with bloating feeling that had been ongoing for a month, had a normal CT scan, abd pain resolved No fevers now Strict return precautions discussed, CT scheduled, pt to go form here to get scan Any worsening tonight needs to go to ED -     DG Abd 1 View; Future -     CBC with Differential/Platelet -     CMP14+EGFR -     Urinalysis -     CT Abdomen Pelvis W Contrast; Future  Best phone number to reach her:  [phone] Follow up plan: Return if symptoms worsen or fail to improve. Assunta Found, MD [place]

## 2017-02-12 NOTE — Progress Notes (Signed)
Patient ID: [name], female   DOB: 07-19-1956, 61 y.o.   MRN: [id number] Mild soft tissue  contast infusion R arm-skin intact-no redness.Diacussed with PT-Pt instructed to keep arm elevated and to use cold compresses.Pt instructed to return to her M.D. If redness,skin breakdown,or pain develops.F/U with her M.D.

## 2017-02-12 NOTE — Anesthesia Postprocedure Evaluation (Signed)
Anesthesia Post Note  Patient: [name]  Procedure(s) Performed: Procedure(s) (LRB): APPENDECTOMY LAPAROSCOPIC (N/A)  Patient location during evaluation: PACU Anesthesia Type: General Level of consciousness: awake, oriented and patient cooperative Pain management: pain level controlled Vital Signs Assessment: post-procedure vital signs reviewed and stable Respiratory status: spontaneous breathing, nonlabored ventilation and respiratory function stable Cardiovascular status: blood pressure returned to baseline Postop Assessment: no signs of nausea or vomiting Anesthetic complications: no     Last Vitals:  Vitals:   02/12/17 2109 02/12/17 2115  BP: 139/64 137/62  Pulse: (!) 58 (!) 58  Resp: 16 15  Temp: 36.4 C     Last Pain:  Vitals:   02/12/17 1613  TempSrc: Oral  PainSc:                  Handy Mcloud J

## 2017-02-12 NOTE — ED Notes (Signed)
Wedding bands given to family

## 2017-02-12 NOTE — Op Note (Signed)
Patient:  [name]  DOB:  02-04-1956  MRN:  [id number]   Preop Diagnosis:  Acute appendicitis  Postop Diagnosis:  Same  Procedure:  Laparoscopic appendectomy  Surgeon:  Aviva Signs, M.D.  Anes:  Gen. endotracheal  Indications:  Patient is a 61 year old white female who presents with a 2 day history of worsening abdominal pain. CT scan of the abdomen reveals acute appendicitis. The risks and benefits of the procedure including bleeding, infection, and the possibility of an open procedure were fully explained to the patient, who gave informed consent.  Procedure note:  The patient was placed in the supine position. After induction of general endotracheal anesthesia, the abdomen was prepped and draped using the usual sterile technique with DuraPrep. Surgical site confirmation was performed.  A supraumbilical incision was made down to the fascia. A Veress needle was introduced into the abdominal cavity and confirmation of placement was done using the saline drop test. The abdomen was then insufflated to 16 mmHg pressure. An 11 mm trocar was introduced into the abdominal cavity under direct visualization without difficulty. The patient was placed in deeper Trendelenburg position and an additional 12 mm trocar was placed in the suprapubic region and a 5 mm trocar was placed left lower quadrant region. The appendix was visualized and noted to be diffusely inflamed. The mesoappendix was divided using the Harmonic scalpel. A standard Endo GIA was placed across the base the appendix and fired. The appendix was then removed using an Endo Catch bag without difficulty. The staple line was inspected and noted to be within normal limits. Arista was placed along the appendiceal mesentery. All fluid and air were then evacuated from the abdominal cavity prior to the removal of the trochars.  All wounds were irrigated with normal saline. All wounds were injected with 0.5% Sensorcaine. The supraumbilical  fascia was reapproximated using an 0 Vicryl interrupted suture. All skin incisions were closed using staples. Betadine ointment and dry sterile dressings were applied.  All tape and needle counts were correct at the end of the procedure. The patient was extubated in the operating room and transferred to PACU in stable condition.  Complications:  None  EBL:  Minimal  Specimen:  Appendix

## 2017-02-12 NOTE — Progress Notes (Signed)
Pt had an 30 ml saline and approx. 10 ml iv infiltration to the right forearm.  Dr Register evaluated the site and directions of ice and elevation were given to the patient.

## 2017-02-12 NOTE — ED Notes (Signed)
Pt is complaining of right forearm pain after CT scan. Arm is swollen and looks infiltrated. Will apply ice to area

## 2017-02-12 NOTE — ED Provider Notes (Addendum)
[facility] DEPT Provider Note   CSN: [id number] Arrival date & time: 02/12/17  1556     History   Chief Complaint Chief Complaint  Patient presents with  . Abdominal Pain    HPI [name] is a 61 y.o. female.  Patient with onset of abdominal pain in the evening on Tuesday around 5 PM. Today she was seen by her primary care doctor she had localized tenderness to the right lower quadrant. Patient describes pain as being generalized all over. Decreased appetite no nausea no vomiting no diarrhea no fevers. Patient's had surgery before she's had her gallbladder removed and has had a hysterectomy. Patient's past medical history significant for being prediabetic, COPD, and MSS fairly well controlled.      Past Medical History:  Diagnosis Date  . Anxiety   . Arthritis   . Cancer ([place])    SKIN / "BORDERLINE CERVICAL CANCER"  . COPD (chronic obstructive pulmonary disease) ([place])   . GERD (gastroesophageal reflux disease)   . History of skin cancer   . Mixed hyperlipidemia   . Multiple sclerosis ([place])   . Neuromuscular disorder ([facility])    MIGRAINES  . Neuropathy    FEET  . PONV (postoperative nausea and vomiting)   . Pre-diabetes   . Psoriasis   . Weakness of left side of body    DUE TO MS    Patient Active Problem List   Diagnosis Date Noted  . Hypotension 01/02/2017  . Urinary incontinence without sensory awareness 01/02/2017  . COPD  GOLD II/ AB 07/24/2016  . S/P hysterectomy 03/03/2016  . Pre-diabetes 09/20/2015  . Generalized anxiety disorder 07/30/2015  . OA (osteoarthritis) of knee 01/22/2015  . B12 deficiency anemia 03/20/2014  . BMI 30.0-30.9,adult 03/20/2014  . Gastroesophageal reflux disease without esophagitis 02/21/2014  . Multiple sclerosis ([place]) 01/17/2013  . COPD with chronic bronchitis ([place]) 01/17/2013  . Mixed hyperlipidemia 03/30/2012  . Cigarette smoker 03/30/2012    Past Surgical History:  Procedure Laterality Date  . ABDOMINAL  HYSTERECTOMY    . CHOLECYSTECTOMY    . COLONOSCOPY N/A 08/02/2015   Procedure: COLONOSCOPY;  Surgeon: Rogene Houston, MD;  Location: [facility] SUITE;  Service: Endoscopy;  Laterality: N/A;  12:00-moved to 1220 Ann to notify pt  . KNEE ARTHROSCOPY     RT  . Left breast lumpectomy    . Left carpal tunnel release    . PARTIAL KNEE ARTHROPLASTY Left 01/22/2015   Procedure: LEFT KNEE MEDIAL UNICOMPARTMENTAL KNEE;  Surgeon: Gaynelle Arabian, MD;  Location: [facility] ORS;  Service: Orthopedics;  Laterality: Left;  . Right total knee replacement  2010  . SHOULDER SURGERY     RT  . Sinus cyst removal    . TOTAL KNEE REVISION  05/05/2012   Procedure: TOTAL KNEE REVISION;  Surgeon: Gearlean Alf, MD;  Location: [facility] ORS;  Service: Orthopedics;  Laterality: Right;  . WRIST SURGERY     rt    OB History    No data available       Home Medications    Prior to Admission medications   Medication Sig Start Date End Date Taking? Authorizing Provider  albuterol (PROVENTIL) (2.5 MG/3ML) 0.083% nebulizer solution Take 3 mLs (2.5 mg total) by nebulization every 6 (six) hours as needed for wheezing or shortness of breath. 07/14/16  Yes [name], MD  albuterol (VENTOLIN HFA) 108 (90 Base) MCG/ACT inhaler Inhale 2 puffs into the lungs every 6 (six) hours as needed. For shortness  of breath 03/03/16  Yes [name], MD  budesonide-formoterol [facility]) 160-4.5 MCG/ACT inhaler Take 2 puffs first thing in am and then another 2 puffs about 12 hours later. 07/24/16  Yes Tanda Rockers, MD  cholecalciferol (VITAMIN D) 1000 units tablet Take 1,000 Units by mouth daily.   Yes [provider]  citalopram (CELEXA) 20 MG tablet TAKE ONE (1) TABLET EACH DAY 11/13/16  Yes [name], MD  cycloSPORINE (RESTASIS) 0.05 % ophthalmic emulsion 1 drop 2 (two) times daily.   Yes [provider]  furosemide (LASIX) 20 MG tablet Take 1 tablet (20 mg total) by mouth daily. As needed 10/29/15  Yes [name], MD  gabapentin (NEURONTIN) 600 MG tablet TAKE ONE TABLET FOUR TIMES DAILY 11/13/16  Yes [name], MD  Melatonin 10 MG TABS Take 1 tablet by mouth at bedtime.   Yes [provider]  nystatin (MYCOSTATIN) 100000 UNIT/ML suspension Take 5 mLs (500,000 Units total) by mouth 4 (four) times daily. Patient taking differently: Take 5 mLs by mouth 4 (four) times daily as needed (yeast).  06/25/16  Yes [name], MD  pantoprazole (PROTONIX) 40 MG tablet TAKE ONE (1) TABLET EACH DAY 01/27/17  Yes [name], MD  varenicline (CHANTIX STARTING MONTH PAK) 0.5 MG X 11 & 1 MG X 42 tablet Take one 0.5 mg tablet by mouth once daily for 3 days, then increase to one 0.5 mg tablet twice daily for 4 days, then increase to one 1 mg tablet twice daily. Patient not taking: Reported on 02/12/2017 09/08/16   [name], MD    Family History Family History  Problem Relation Age of Onset  . Dementia Mother   . Atrial fibrillation Father        Died with pulmonary embolus  . Emphysema Father   . Heart disease Father   . Cancer - Colon Maternal Grandmother   . Heart disease Maternal Grandfather     Social History Social History  Substance Use Topics  . Smoking status: Current Every Day Smoker    Packs/day: 0.50    Years: 40.00    Types: Cigarettes  . Smokeless tobacco: Never Used     Comment: 3/4 pack a day sicne age 59  . Alcohol use No     Allergies   Fenofibrate; Pravastatin; and Penicillins   Review of Systems Review of Systems  Constitutional: Positive for appetite change. Negative for fever.  HENT: Negative for congestion.   Eyes: Negative for redness.  Respiratory: Negative for shortness of breath.   Cardiovascular: Negative for chest pain.  Gastrointestinal: Positive for abdominal pain. Negative for diarrhea, nausea and vomiting.  Genitourinary: Negative for dysuria.  Musculoskeletal: Negative for back pain.  Skin: Negative for rash.  Neurological:  Negative for headaches.  Hematological: Does not bruise/bleed easily.  Psychiatric/Behavioral: Negative for confusion.     Physical Exam Updated Vital Signs BP 137/69   Pulse (!) 54   Temp 98.3 F (36.8 C) (Oral)   Resp 18   Ht 1.778 m (5\' 10" )   Wt 100.2 kg (221 lb)   SpO2 99%   BMI 31.71 kg/m   Physical Exam  Constitutional: She is oriented to person, place, and time. She appears well-developed and well-nourished. No distress.  HENT:  Head: Normocephalic and atraumatic.  Mouth/Throat: Oropharynx is clear and moist.  Eyes: Conjunctivae and EOM are normal. Pupils are equal, round, and reactive to light.  Neck: Normal range of  motion. Neck supple.  Cardiovascular: Normal rate, regular rhythm and normal heart sounds.   Pulmonary/Chest: Effort normal and breath sounds normal. No respiratory distress.  Abdominal: Soft. Bowel sounds are normal. There is tenderness. There is guarding.  Tenderness right lower quadrant with slight guarding.  Musculoskeletal: Normal range of motion.  Neurological: She is alert and oriented to person, place, and time. No cranial nerve deficit or sensory deficit. She exhibits normal muscle tone. Coordination normal.  Skin: Skin is warm.  Nursing note and vitals reviewed.    ED Treatments / Results  Labs (all labs ordered are listed, but only abnormal results are displayed) Labs Reviewed  CBC WITH DIFFERENTIAL/PLATELET - Abnormal; Notable for the following:       Result Value   WBC 11.0 (*)    All other components within normal limits  COMPREHENSIVE METABOLIC PANEL - Abnormal; Notable for the following:    Sodium 134 (*)    Potassium 3.4 (*)    Chloride 98 (*)    Glucose, Bld 109 (*)    Calcium 8.7 (*)    ALT 13 (*)    All other components within normal limits  LIPASE, BLOOD  URINALYSIS, ROUTINE W REFLEX MICROSCOPIC    EKG  EKG Interpretation None      ED ECG REPORT   Date: 02/12/2017  Rate: 61  Rhythm: normal sinus rhythm  QRS  Axis: normal  Intervals: QT prolonged  ST/T Wave abnormalities: normal  Conduction Disutrbances:none  Narrative Interpretation:   Old EKG Reviewed: none available  I have personally reviewed the EKG tracing and agree with the computerized printout as noted.   Radiology Dg Abd 1 View  Result Date: 02/12/2017 CLINICAL DATA:  Two days of abdominal pain. History of previous cholecystectomy, multiple scleroses, COPD, current smoker. EXAM: ABDOMEN - 1 VIEW COMPARISON:  Abdominal and pelvic CT scan of June 08, 2015 FINDINGS: There are small air in fluid levels noted in the right aspect of the abdomen likely within the ascending colon and hepatic flexure. No small bowel air-fluid levels are observed. There is no free extraluminal gas. The stool burden is not excessive. There surgical clips in the gallbladder fossa. No abnormal soft tissue calcifications are observed. The bony structures exhibit no acute abnormalities. There are degenerative changes of both hips and of the lower lumbar spine. There is stable increased density at the right lung base. IMPRESSION: Relatively nonspecific bowel gas pattern which may reflect a mild colonic ileus or gastroenteritis type process. No evidence of obstruction or perforation. Electronically Signed   By: David  Martinique M.D.   On: 02/12/2017 12:01   Ct Abdomen Pelvis W Contrast  Result Date: 02/12/2017 CLINICAL DATA:  Severe abdominal pain for 2 days. Possible ileus on chest radiograph. Hysterectomy and cholecystectomy. EXAM: CT ABDOMEN AND PELVIS WITH CONTRAST TECHNIQUE: Multidetector CT imaging of the abdomen and pelvis was performed using the standard protocol following bolus administration of intravenous contrast. CONTRAST:  159mL ISOVUE-300 IOPAMIDOL (ISOVUE-300) INJECTION 61% COMPARISON:  Plain film 02/12/2017.  CT of 06/08/2015 FINDINGS: Lower chest: Bibasilar atelectasis. Normal heart size without pericardial or pleural effusion. Tiny hiatal hernia. Hepatobiliary:  Too small to characterize left hepatic lobe lesion is unchanged and can be presumed benign. Probable Riedel's lobe . Cholecystectomy, without biliary ductal dilatation. Pancreas: Moderate pancreatic atrophy, without duct dilatation or dominant mass. Spleen: Normal in size, without focal abnormality. Adrenals/Urinary Tract: Normal adrenal glands. Normal kidneys, without hydronephrosis. Normal urinary bladder. Stomach/Bowel: Normal remainder of the stomach. Fluid-filled colon, possibly  representing a diarrheal state. Normal terminal ileum. The appendix is mildly dilated, especially proximally. Example 11 mm on coronal image 54/series 4. Suspect subtle periappendiceal edema, including on coronal image 49. The distal appendix is relatively normal in appearance. No periappendiceal fluid collection or extraluminal gas. No small bowel dilatation Vascular/Lymphatic: Aortic and branch vessel atherosclerosis. Mild right external iliac adenopathy is similar at 11 mm on image 71/series 2. Likely reactive. No abdominal adenopathy. Reproductive: Hysterectomy. Other: Mild pelvic floor laxity. Musculoskeletal: No acute osseous abnormality. IMPRESSION: 1. Findings suspicious for early/mild appendicitis, relatively localized to the appendiceal base. No complication. 2.  Aortic Atherosclerosis (ICD10-I70.0). 3.  Tiny hiatal hernia. These results will be called to the ordering clinician or representative by the Radiologist Assistant, and communication documented in the PACS or zVision Dashboard. Electronically Signed   By: Abigail Miyamoto M.D.   On: 02/12/2017 15:39   Dg Chest Port 1 View  Result Date: 02/12/2017 CLINICAL DATA:  Preoperative radiograph. EXAM: PORTABLE CHEST 1 VIEW COMPARISON:  06/14/2016 FINDINGS: Cardiomediastinal silhouette is normal. Mediastinal contours appear intact. Calcific atherosclerotic disease of the aorta. There is no evidence of focal airspace consolidation, pleural effusion or pneumothorax. Right lower  lobe atelectasis versus scarring. Osseous structures are without acute abnormality. Soft tissues are grossly normal. IMPRESSION: Calcific atherosclerotic disease of the aorta. Right lower lobe atelectasis versus scarring. Electronically Signed   By: Fidela Salisbury M.D.   On: 02/12/2017 17:20    Procedures Procedures (including critical care time)  Medications Ordered in ED Medications  0.9 %  sodium chloride infusion ( Intravenous New Bag/Given 02/12/17 1733)  HYDROmorphone (DILAUDID) injection 1 mg (1 mg Intravenous Refused 02/12/17 1734)  ciprofloxacin (CIPRO) IVPB 400 mg (not administered)  metroNIDAZOLE (FLAGYL) IVPB 500 mg (not administered)  sodium chloride 0.9 % bolus 500 mL (500 mLs Intravenous New Bag/Given 02/12/17 1720)     Initial Impression / Assessment and Plan / ED Course  I have reviewed the triage vital signs and the nursing notes.  Pertinent labs & imaging results that were available during my care of the patient were reviewed by me and considered in my medical decision making (see chart for details).     A CT seems to be consistent with early appendicitis. The patient with tenderness right lower quadrant. Patient's past medical history she has some mild COPD, history of MS. Has had a hysterectomy in her gallbladder removed in the past. Also known to be prediabetic but not on any diabetic medicines.  Discussed with Dr. Arnoldo Morale general surgery. Prep her for preop. Start antibiotics for him. He is planning surgery at this point at 8 PM this evening.  Final Clinical Impressions(s) / ED Diagnoses   Final diagnoses:  Acute appendicitis with localized peritonitis    New Prescriptions New Prescriptions   No medications on file     Fredia Sorrow, MD 02/12/17 1754    Fredia Sorrow, MD 02/12/17 [phone]

## 2017-02-12 NOTE — Transfer of Care (Signed)
Immediate Anesthesia Transfer of Care Note  Patient: [name]  Procedure(s) Performed: Procedure(s): APPENDECTOMY LAPAROSCOPIC (N/A)  Patient Location: PACU  Anesthesia Type:General  Level of Consciousness: awake and patient cooperative  Airway & Oxygen Therapy: Patient Spontanous Breathing and Patient connected to face mask oxygen  Post-op Assessment: Report given to RN, Post -op Vital signs reviewed and stable and Patient moving all extremities  Post vital signs: Reviewed and stable  Last Vitals:  Vitals:   02/12/17 1937 02/12/17 1945  BP: 140/80   Pulse: (!) 59 (!) 59  Resp: 18   Temp:      Last Pain:  Vitals:   02/12/17 1613  TempSrc: Oral  PainSc:          Complications: No apparent anesthesia complications

## 2017-02-12 NOTE — ED Triage Notes (Signed)
Pt sent from CT with possible early appendicitis. Complaining of entire abdominal pain since Tuesday and seen by PCP today and sent for . Per CT IV infiltrated in right arm

## 2017-02-12 NOTE — Anesthesia Procedure Notes (Signed)
Procedure Name: Intubation Date/Time: 02/12/2017 8:11 PM Performed by: Charmaine Downs Pre-anesthesia Checklist: Patient identified, Emergency Drugs available, Suction available and Patient being monitored Patient Re-evaluated:Patient Re-evaluated prior to inductionOxygen Delivery Method: Circle system utilized Preoxygenation: Pre-oxygenation with 100% oxygen Intubation Type: IV induction, Rapid sequence and Cricoid Pressure applied Ventilation: Oral airway inserted - appropriate to patient size Laryngoscope Size: Mac and 4 Grade View: Grade II Tube type: Oral Tube size: 7.0 mm Number of attempts: 1 Airway Equipment and Method: Stylet Placement Confirmation: ETT inserted through vocal cords under direct vision,  positive ETCO2 and breath sounds checked- equal and bilateral Secured at: 22 cm Tube secured with: Tape Dental Injury: Teeth and Oropharynx as per pre-operative assessment

## 2017-02-12 NOTE — H&P (Signed)
[name] is an 61 y.o. female.   Chief Complaint: Abdominal pain HPI: Patient is a 61 year old white female who presents with a 48 hour history of worsening abdominal pain. It started 2 days ago, but seemed to localize itself into the lower abdomen. She had an outpatient CT scan which revealed acute appendicitis without perforation.  Past Medical History:  Diagnosis Date  . Anxiety   . Arthritis   . Cancer ([place])    SKIN / "BORDERLINE CERVICAL CANCER"  . COPD (chronic obstructive pulmonary disease) ([place])   . GERD (gastroesophageal reflux disease)   . History of skin cancer   . Mixed hyperlipidemia   . Multiple sclerosis ([place])   . Neuromuscular disorder ([facility])    MIGRAINES  . Neuropathy    FEET  . PONV (postoperative nausea and vomiting)   . Pre-diabetes   . Psoriasis   . Weakness of left side of body    DUE TO MS    Past Surgical History:  Procedure Laterality Date  . ABDOMINAL HYSTERECTOMY    . CHOLECYSTECTOMY    . COLONOSCOPY N/A 08/02/2015   Procedure: COLONOSCOPY;  Surgeon: Rogene Houston, MD;  Location: [facility];  Service: Endoscopy;  Laterality: N/A;  12:00-moved to 1220 Ann to notify pt  . KNEE ARTHROSCOPY     RT  . Left breast lumpectomy    . Left carpal tunnel release    . PARTIAL KNEE ARTHROPLASTY Left 01/22/2015   Procedure: LEFT KNEE MEDIAL UNICOMPARTMENTAL KNEE;  Surgeon: Gaynelle Arabian, MD;  Location: [facility];  Service: Orthopedics;  Laterality: Left;  . Right total knee replacement  2010  . SHOULDER SURGERY     RT  . Sinus cyst removal    . TOTAL KNEE REVISION  05/05/2012   Procedure: TOTAL KNEE REVISION;  Surgeon: Gearlean Alf, MD;  Location: [facility];  Service: Orthopedics;  Laterality: Right;  . WRIST SURGERY     rt    Family History  Problem Relation Age of Onset  . Dementia Mother   . Atrial fibrillation Father        Died with pulmonary embolus  . Emphysema Father   . Heart disease Father   . Cancer - Colon Maternal Grandmother   .  Heart disease Maternal Grandfather    Social History:  reports that she has been smoking Cigarettes.  She has a 20.00 pack-year smoking history. She has never used smokeless tobacco. She reports that she does not drink alcohol or use drugs.  Allergies:  Allergies  Allergen Reactions  . Fenofibrate     unknown  . Pravastatin Other (See Comments)    "flu like symptoms"   . Penicillins Other (See Comments)    Mouth gets blisters     (Not in a hospital admission)  Results for orders placed or performed during the hospital encounter of 02/12/17 (from the past 48 hour(s))  Urinalysis, Routine w reflex microscopic     Status: Abnormal   Collection Time: 02/12/17  4:13 PM  Result Value Ref Range   Color, Urine STRAW (A) YELLOW   APPearance CLEAR CLEAR   Specific Gravity, Urine 1.028 1.005 - 1.030   pH 7.0 5.0 - 8.0   Glucose, UA NEGATIVE NEGATIVE mg/dL   Hgb urine dipstick NEGATIVE NEGATIVE   Bilirubin Urine NEGATIVE NEGATIVE   Ketones, ur NEGATIVE NEGATIVE mg/dL   Protein, ur NEGATIVE NEGATIVE mg/dL   Nitrite NEGATIVE NEGATIVE   Leukocytes, UA LARGE (A) NEGATIVE  RBC / HPF 0-5 0 - 5 RBC/hpf   WBC, UA 6-30 0 - 5 WBC/hpf   Bacteria, UA NONE SEEN NONE SEEN   Squamous Epithelial / LPF 0-5 (A) NONE SEEN  CBC with Differential     Status: Abnormal   Collection Time: 02/12/17  4:29 PM  Result Value Ref Range   WBC 11.0 (H) 4.0 - 10.5 K/uL   RBC 5.10 3.87 - 5.11 MIL/uL   Hemoglobin 14.7 12.0 - 15.0 g/dL   HCT 45.5 36.0 - 46.0 %   MCV 89.2 78.0 - 100.0 fL   MCH 28.8 26.0 - 34.0 pg   MCHC 32.3 30.0 - 36.0 g/dL   RDW 13.8 11.5 - 15.5 %   Platelets 224 150 - 400 K/uL   Neutrophils Relative % 62 %   Neutro Abs 6.9 1.7 - 7.7 K/uL   Lymphocytes Relative 30 %   Lymphs Abs 3.3 0.7 - 4.0 K/uL   Monocytes Relative 5 %   Monocytes Absolute 0.6 0.1 - 1.0 K/uL   Eosinophils Relative 2 %   Eosinophils Absolute 0.2 0.0 - 0.7 K/uL   Basophils Relative 1 %   Basophils Absolute 0.1 0.0 - 0.1  K/uL  Comprehensive metabolic panel     Status: Abnormal   Collection Time: 02/12/17  4:29 PM  Result Value Ref Range   Sodium 134 (L) 135 - 145 mmol/L   Potassium 3.4 (L) 3.5 - 5.1 mmol/L   Chloride 98 (L) 101 - 111 mmol/L   CO2 23 22 - 32 mmol/L   Glucose, Bld 109 (H) 65 - 99 mg/dL   BUN 11 6 - 20 mg/dL   Creatinine, Ser 0.87 0.44 - 1.00 mg/dL   Calcium 8.7 (L) 8.9 - 10.3 mg/dL   Total Protein 7.5 6.5 - 8.1 g/dL   Albumin 4.2 3.5 - 5.0 g/dL   AST 15 15 - 41 U/L   ALT 13 (L) 14 - 54 U/L   Alkaline Phosphatase 70 38 - 126 U/L   Total Bilirubin 0.6 0.3 - 1.2 mg/dL   GFR calc non Af Amer >60 >60 mL/min   GFR calc Af Amer >60 >60 mL/min    Comment: (NOTE) The eGFR has been calculated using the CKD EPI equation. This calculation has not been validated in all clinical situations. eGFR's persistently <60 mL/min signify possible Chronic Kidney Disease.    Anion gap 13 5 - 15  Lipase, blood     Status: None   Collection Time: 02/12/17  4:29 PM  Result Value Ref Range   Lipase 25 11 - 51 U/L   Dg Abd 1 View  Result Date: 02/12/2017 CLINICAL DATA:  Two days of abdominal pain. History of previous cholecystectomy, multiple scleroses, COPD, current smoker. EXAM: ABDOMEN - 1 VIEW COMPARISON:  Abdominal and pelvic CT scan of June 08, 2015 FINDINGS: There are small air in fluid levels noted in the right aspect of the abdomen likely within the ascending colon and hepatic flexure. No small bowel air-fluid levels are observed. There is no free extraluminal gas. The stool burden is not excessive. There surgical clips in the gallbladder fossa. No abnormal soft tissue calcifications are observed. The bony structures exhibit no acute abnormalities. There are degenerative changes of both hips and of the lower lumbar spine. There is stable increased density at the right lung base. IMPRESSION: Relatively nonspecific bowel gas pattern which may reflect a mild colonic ileus or gastroenteritis type process.  No evidence of obstruction or perforation.  Electronically Signed   By: David  Martinique M.D.   On: 02/12/2017 12:01   Ct Abdomen Pelvis W Contrast  Result Date: 02/12/2017 CLINICAL DATA:  Severe abdominal pain for 2 days. Possible ileus on chest radiograph. Hysterectomy and cholecystectomy. EXAM: CT ABDOMEN AND PELVIS WITH CONTRAST TECHNIQUE: Multidetector CT imaging of the abdomen and pelvis was performed using the standard protocol following bolus administration of intravenous contrast. CONTRAST:  157m ISOVUE-300 IOPAMIDOL (ISOVUE-300) INJECTION 61% COMPARISON:  Plain film 02/12/2017.  CT of 06/08/2015 FINDINGS: Lower chest: Bibasilar atelectasis. Normal heart size without pericardial or pleural effusion. Tiny hiatal hernia. Hepatobiliary: Too small to characterize left hepatic lobe lesion is unchanged and can be presumed benign. Probable Riedel's lobe . Cholecystectomy, without biliary ductal dilatation. Pancreas: Moderate pancreatic atrophy, without duct dilatation or dominant mass. Spleen: Normal in size, without focal abnormality. Adrenals/Urinary Tract: Normal adrenal glands. Normal kidneys, without hydronephrosis. Normal urinary bladder. Stomach/Bowel: Normal remainder of the stomach. Fluid-filled colon, possibly representing a diarrheal state. Normal terminal ileum. The appendix is mildly dilated, especially proximally. Example 11 mm on coronal image 54/series 4. Suspect subtle periappendiceal edema, including on coronal image 49. The distal appendix is relatively normal in appearance. No periappendiceal fluid collection or extraluminal gas. No small bowel dilatation Vascular/Lymphatic: Aortic and branch vessel atherosclerosis. Mild right external iliac adenopathy is similar at 11 mm on image 71/series 2. Likely reactive. No abdominal adenopathy. Reproductive: Hysterectomy. Other: Mild pelvic floor laxity. Musculoskeletal: No acute osseous abnormality. IMPRESSION: 1. Findings suspicious for early/mild  appendicitis, relatively localized to the appendiceal base. No complication. 2.  Aortic Atherosclerosis (ICD10-I70.0). 3.  Tiny hiatal hernia. These results will be called to the ordering clinician or representative by the Radiologist Assistant, and communication documented in the PACS or zVision Dashboard. Electronically Signed   By: KAbigail MiyamotoM.D.   On: 02/12/2017 15:39   Dg Chest Port 1 View  Result Date: 02/12/2017 CLINICAL DATA:  Preoperative radiograph. EXAM: PORTABLE CHEST 1 VIEW COMPARISON:  06/14/2016 FINDINGS: Cardiomediastinal silhouette is normal. Mediastinal contours appear intact. Calcific atherosclerotic disease of the aorta. There is no evidence of focal airspace consolidation, pleural effusion or pneumothorax. Right lower lobe atelectasis versus scarring. Osseous structures are without acute abnormality. Soft tissues are grossly normal. IMPRESSION: Calcific atherosclerotic disease of the aorta. Right lower lobe atelectasis versus scarring. Electronically Signed   By: DFidela SalisburyM.D.   On: 02/12/2017 17:20    Review of Systems  Constitutional: Positive for fever and malaise/fatigue.  HENT: Negative.   Eyes: Negative.   Respiratory: Negative.   Cardiovascular: Negative.   Gastrointestinal: Positive for abdominal pain and nausea.  Genitourinary: Negative.   Musculoskeletal: Negative.   Skin: Negative.   Neurological: Negative.   Endo/Heme/Allergies: Negative.   Psychiatric/Behavioral: Negative.     Blood pressure (!) 155/85, pulse (!) 56, temperature 98.3 F (36.8 C), temperature source Oral, resp. rate 17, height 5' 10"  (1.778 m), weight 221 lb (100.2 kg), SpO2 99 %. Physical Exam  Vitals reviewed. Constitutional: She is oriented to person, place, and time. She appears well-developed and well-nourished.  HENT:  Head: Normocephalic and atraumatic.  Neck: Normal range of motion. Neck supple.  Cardiovascular: Normal rate, regular rhythm and normal heart sounds.    No murmur heard. Respiratory: Effort normal and breath sounds normal. She has no wheezes. She has no rales.  GI: Soft. She exhibits no distension. There is tenderness. There is no rebound.  Tender in the right lower quadrant palpation. No rigidity noted.  Neurological: She  is alert and oriented to person, place, and time.  Skin: Skin is warm and dry.    CT scan report reviewed Assessment/Plan Impression: Acute appendicitis Plan: Patient is going to the operating room for laparoscopic appendectomy. The risks and benefits of the procedure including bleeding, infection, and the possibility of an open procedure were fully explained to the patient, who gave informed consent.  Aviva Signs, MD 02/12/2017, 7:29 PM

## 2017-02-13 ENCOUNTER — Encounter (HOSPITAL_COMMUNITY): Payer: Self-pay | Admitting: General Surgery

## 2017-02-13 LAB — CMP14+EGFR
ALK PHOS: 70 IU/L (ref 39–117)
ALT: 8 IU/L (ref 0–32)
AST: 11 IU/L (ref 0–40)
Albumin/Globulin Ratio: 1.7 (ref 1.2–2.2)
Albumin: 4 g/dL (ref 3.5–5.5)
BUN/Creatinine Ratio: 14 (ref 9–23)
BUN: 12 mg/dL (ref 6–24)
Bilirubin Total: 0.4 mg/dL (ref 0.0–1.2)
CO2: 24 mmol/L (ref 20–29)
CREATININE: 0.88 mg/dL (ref 0.57–1.00)
Calcium: 9.1 mg/dL (ref 8.7–10.2)
Chloride: 103 mmol/L (ref 96–106)
GFR calc Af Amer: 84 mL/min/{1.73_m2} (ref >59)
GFR calc non Af Amer: 73 mL/min/{1.73_m2} (ref >59)
GLUCOSE: 127 mg/dL — AB (ref 65–99)
Globulin, Total: 2.3 g/dL (ref 1.5–4.5)
Potassium: 5 mmol/L (ref 3.5–5.2)
Sodium: 143 mmol/L (ref 134–144)
Total Protein: 6.3 g/dL (ref 6.0–8.5)

## 2017-02-13 LAB — CBC
HEMATOCRIT: 40.8 % (ref 36.0–46.0)
HEMOGLOBIN: 13.2 g/dL (ref 12.0–15.0)
MCH: 29.1 pg (ref 26.0–34.0)
MCHC: 32.4 g/dL (ref 30.0–36.0)
MCV: 90.1 fL (ref 78.0–100.0)
PLATELETS: 219 10*3/uL (ref 150–400)
RBC: 4.53 MIL/uL (ref 3.87–5.11)
RDW: 13.6 % (ref 11.5–15.5)
WBC: 8.6 10*3/uL (ref 4.0–10.5)

## 2017-02-13 LAB — CBC WITH DIFFERENTIAL/PLATELET
BASOS ABS: 0 10*3/uL (ref 0.0–0.2)
Basos: 0 %
EOS (ABSOLUTE): 0.2 10*3/uL (ref 0.0–0.4)
Eos: 2 %
HEMOGLOBIN: 13.6 g/dL (ref 11.1–15.9)
Hematocrit: 41.1 % (ref 34.0–46.6)
Immature Grans (Abs): 0 10*3/uL (ref 0.0–0.1)
Immature Granulocytes: 0 %
LYMPHS ABS: 2.7 10*3/uL (ref 0.7–3.1)
Lymphs: 29 %
MCH: 28.8 pg (ref 26.6–33.0)
MCHC: 33.1 g/dL (ref 31.5–35.7)
MCV: 87 fL (ref 79–97)
MONOCYTES: 6 %
MONOS ABS: 0.6 10*3/uL (ref 0.1–0.9)
Neutrophils Absolute: 6 10*3/uL (ref 1.4–7.0)
Neutrophils: 63 %
Platelets: 226 10*3/uL (ref 150–379)
RBC: 4.72 x10E6/uL (ref 3.77–5.28)
RDW: 14.1 % (ref 12.3–15.4)
WBC: 9.5 10*3/uL (ref 3.4–10.8)

## 2017-02-13 LAB — BASIC METABOLIC PANEL
ANION GAP: 6 (ref 5–15)
BUN: 10 mg/dL (ref 6–20)
CHLORIDE: 105 mmol/L (ref 101–111)
CO2: 26 mmol/L (ref 22–32)
Calcium: 8.3 mg/dL — ABNORMAL LOW (ref 8.9–10.3)
Creatinine, Ser: 0.92 mg/dL (ref 0.44–1.00)
GFR calc Af Amer: >60 mL/min (ref >60)
GLUCOSE: 159 mg/dL — AB (ref 65–99)
POTASSIUM: 5 mmol/L (ref 3.5–5.1)
Sodium: 137 mmol/L (ref 135–145)

## 2017-02-13 MED ORDER — HYDROCODONE-ACETAMINOPHEN 5-325 MG PO TABS: 1.0000 | ORAL_TABLET | ORAL | 0 refills | Status: DC | PRN

## 2017-02-13 MED ORDER — BUPIVACAINE HCL (PF) 0.5 % IJ SOLN
INTRAMUSCULAR | Status: DC | PRN
  Administered 2017-02-12: 10 mL

## 2017-02-13 MED ORDER — POVIDONE-IODINE 10 % OINT PACKET
TOPICAL_OINTMENT | CUTANEOUS | Status: DC | PRN
  Administered 2017-02-12: 1 via TOPICAL

## 2017-02-13 MED ORDER — HEMOSTATIC AGENTS (NO CHARGE) OPTIME
TOPICAL | Status: DC | PRN
  Administered 2017-02-12: 1 via TOPICAL

## 2017-02-13 MED ORDER — SODIUM CHLORIDE 0.9 % IR SOLN
Status: DC | PRN
  Administered 2017-02-12: 1000 mL

## 2017-02-13 NOTE — Discharge Instructions (Signed)
Laparoscopic Appendectomy, Adult, Care After °Refer to this sheet in the next few weeks. These instructions provide you with information about caring for yourself after your procedure. Your health care provider may also give you more specific instructions. Your treatment has been planned according to current medical practices, but problems sometimes occur. Call your health care provider if you have any problems or questions after your procedure. °What can I expect after the procedure? °After the procedure, it is common to have: °· A decrease in your energy level. °· Mild pain in the area where the surgical cuts (incisions) were made. °· Constipation. This can be caused by pain medicine and a decrease in your activity. ° °Follow these instructions at home: °Medicines °· Take over-the-counter and prescription medicines only as told by your health care provider. °· Do not drive for 24 hours if you received a sedative. °· Do not drive or operate heavy machinery while taking prescription pain medicine. °· If you were prescribed an antibiotic medicine, take it as told by your health care provider. Do not stop taking the antibiotic even if you start to feel better. °Activity °· For 3 weeks or as long as told by your health care provider: °? Do not lift anything that is heavier than 10 pounds (4.5 kg). °? Do not play contact sports. °· Gradually return to your normal activities. Ask your health care provider what activities are safe for you. °Bathing °· Keep your incisions clean and dry. Clean them as often as told by your health care provider: °? Gently wash the incisions with soap and water. °? Rinse the incisions with water to remove all soap. °? Pat the incisions dry with a clean towel. Do not rub the incisions. °· You may take showers after 48 hours. °· Do not take baths, swim, or use hot tubs for 2 weeks or as told by your health care provider. °Incision care °· Follow instructions from your healthcare provider about  how to take care of your incisions. Make sure you: °? Wash your hands with soap and water before you change your bandage (dressing). If soap and water are not available, use hand sanitizer. °? Change your dressing as told by your health care provider. °? Leave stitches (sutures), skin glue, or adhesive strips in place. These skin closures may need to stay in place for 2 weeks or longer. If adhesive strip edges start to loosen and curl up, you may trim the loose edges. Do not remove adhesive strips completely unless your health care provider tells you to do that. °· Check your incision areas every day for signs of infection. Check for: °? More redness, swelling, or pain. °? More fluid or blood. °? Warmth. °? Pus or a bad smell. °Other Instructions °· If you were sent home with a drain, follow instructions from your health care provider about how to care for the drain and how to empty it. °· Take deep breaths. This helps to prevent your lungs from becoming inflamed. °· To relieve and prevent constipation: °? Drink plenty of fluids. °? Eat plenty of fruits and vegetables. °· Keep all follow-up visits as told by your health care provider. This is important. °Contact a health care provider if: °· You have more redness, swelling, or pain around an incision. °· You have more fluid or blood coming from an incision. °· Your incision feels warm to the touch. °· You have pus or a bad smell coming from an incision or dressing. °· Your incision   edges break open after your sutures have been removed.  You have increasing pain in your shoulders.  You feel dizzy or you faint.  You develop shortness of breath.  You keep feeling nauseous or vomiting.  You have diarrhea or you cannot control your bowel functions.  You lose your appetite.  You develop swelling or pain in your legs. Get help right away if:  You have a fever.  You develop a rash.  You have difficulty breathing.  You have sharp pains in your  chest. This information is not intended to replace advice given to you by your health care provider. Make sure you discuss any questions you have with your health care provider. Document Released: 07/28/2005 Document Revised: 12/28/2015 Document Reviewed: 01/15/2015 Elsevier Interactive Patient Education  2017 [employer].

## 2017-02-13 NOTE — Discharge Summary (Signed)
Physician Discharge Summary  Patient ID: ALVERIA MCGLAUGHLIN MRN: [id number] DOB/AGE: [birth year] 61 y.o.  Admit date: 02/12/2017 Discharge date: 02/13/2017  Admission Diagnoses: Acute Appendicitis  Discharge Diagnoses: Same Active Problems:   Acute appendicitis with localized peritonitis   Acute appendicitis   Discharged Condition: good  Hospital Course: Patient is a 61 year old white female who presented to the emergency room with worsening abdominal pain. CT scan of the abdomen revealed acute appendicitis. She was taken to the operating room on 02/12/2017 and underwent an uneventful laparoscopic appendectomy. She tolerated the procedure well. Her postoperative course has been unremarkable. Her diet was advanced without difficulty. The patient is being discharged home on 02/13/2017 in good and improving condition.  Treatments: surgery: Laparoscopic appendectomy on 02/12/2017  Discharge Exam: Blood pressure (!) 105/54, pulse (!) 54, temperature 98.2 F (36.8 C), temperature source Oral, resp. rate 16, height 5\' 10"  (1.778 m), weight 220 lb 14.4 oz (100.2 kg), SpO2 95 %. General appearance: alert, cooperative and no distress Resp: clear to auscultation bilaterally Cardio: regular rate and rhythm, S1, S2 normal, no murmur, click, rub or gallop GI: Soft, incisions healing well.  Disposition: 01-Home or Self Care  Discharge Instructions    Diet - low sodium heart healthy    Complete by:  As directed    Increase activity slowly    Complete by:  As directed      Allergies as of 02/13/2017      Reactions   Fenofibrate    unknown   Pravastatin Other (See Comments)   "flu like symptoms"    Penicillins Other (See Comments)   Mouth gets blisters      Medication List    TAKE these medications   albuterol 108 (90 Base) MCG/ACT inhaler Commonly known as:  VENTOLIN HFA Inhale 2 puffs into the lungs every 6 (six) hours as needed. For shortness of breath   albuterol (2.5 MG/3ML) 0.083%  nebulizer solution Commonly known as:  PROVENTIL Take 3 mLs (2.5 mg total) by nebulization every 6 (six) hours as needed for wheezing or shortness of breath.   budesonide-formoterol 160-4.5 MCG/ACT inhaler Commonly known as:  SYMBICORT Take 2 puffs first thing in am and then another 2 puffs about 12 hours later.   cholecalciferol 1000 units tablet Commonly known as:  VITAMIN D Take 1,000 Units by mouth daily.   citalopram 20 MG tablet Commonly known as:  CELEXA TAKE ONE (1) TABLET EACH DAY   cycloSPORINE 0.05 % ophthalmic emulsion Commonly known as:  RESTASIS 1 drop 2 (two) times daily.   furosemide 20 MG tablet Commonly known as:  LASIX Take 1 tablet (20 mg total) by mouth daily. As needed   gabapentin 600 MG tablet Commonly known as:  NEURONTIN TAKE ONE TABLET FOUR TIMES DAILY   HYDROcodone-acetaminophen 5-325 MG tablet Commonly known as:  NORCO Take 1 tablet by mouth every 4 (four) hours as needed for moderate pain.   Melatonin 10 MG Tabs Take 1 tablet by mouth at bedtime.   nystatin 100000 UNIT/ML suspension Commonly known as:  MYCOSTATIN Take 5 mLs (500,000 Units total) by mouth 4 (four) times daily. What changed:  when to take this  reasons to take this   pantoprazole 40 MG tablet Commonly known as:  PROTONIX TAKE ONE (1) TABLET EACH DAY   varenicline 0.5 MG X 11 & 1 MG X 42 tablet Commonly known as:  CHANTIX STARTING MONTH PAK Take one 0.5 mg tablet by mouth once daily for 3 days, then  increase to one 0.5 mg tablet twice daily for 4 days, then increase to one 1 mg tablet twice daily.      Follow-up Information    Aviva Signs, MD. Schedule an appointment as soon as possible for a visit on 02/24/2017.   Specialty:  General Surgery Contact information: [id number] [place] [address] [phone]           Signed: Aviva Signs 02/13/2017, 8:20 AM

## 2017-02-13 NOTE — Progress Notes (Signed)
Pt in room resting, pt stated that she had no pain. Call light within reach and bed alarm on.

## 2017-02-13 NOTE — Progress Notes (Signed)
Pt right foram is slightly swollen today. No redness Some very localized bruises noted in [facility] area of arm Pt states that is slightly tender to touch and swelling is going down.

## 2017-02-13 NOTE — Progress Notes (Signed)
Discharge instructions read to patient and family  Both verbalized understanding of all instructions discharged to home with family

## 2017-02-17 ENCOUNTER — Encounter (HOSPITAL_COMMUNITY): Payer: Self-pay | Admitting: General Surgery

## 2017-02-24 ENCOUNTER — Encounter: Payer: Self-pay | Admitting: General Surgery

## 2017-02-24 ENCOUNTER — Ambulatory Visit (INDEPENDENT_AMBULATORY_CARE_PROVIDER_SITE_OTHER): Payer: Self-pay | Admitting: General Surgery

## 2017-02-24 VITALS — BP 133/69 | HR 76 | Temp 97.8°F | Resp 18 | Ht 70.0 in | Wt 225.0 lb

## 2017-02-24 DIAGNOSIS — Z09 Encounter for follow-up examination after completed treatment for conditions other than malignant neoplasm: Secondary | ICD-10-CM

## 2017-02-24 NOTE — Progress Notes (Signed)
Subjective:     [name]  Status post laparoscopic appendectomy. Doing very well. No complaints. Objective:    BP 133/69   Pulse 76   Temp 97.8 F (36.6 C)   Resp 18   Ht 5\' 10"  (1.778 m)   Wt 225 lb (102.1 kg)   BMI 32.28 kg/m   General:  alert, cooperative and no distress  Abdomen soft, incisions healing well. Staples removed, Steri-Strips applied. Final pathology consistent with diagnosis.     Assessment:    Doing well postoperatively.    Plan:   Increase activity as able. Follow-up as needed.

## 2017-03-05 ENCOUNTER — Ambulatory Visit (INDEPENDENT_AMBULATORY_CARE_PROVIDER_SITE_OTHER): Payer: Medicare HMO | Admitting: *Deleted

## 2017-03-05 DIAGNOSIS — D518 Other vitamin B12 deficiency anemias: Secondary | ICD-10-CM | POA: Diagnosis not present

## 2017-03-05 MED ORDER — CYANOCOBALAMIN 1000 MCG/ML IJ SOLN
1000.0000 ug | INTRAMUSCULAR | Status: AC
  Administered 2017-03-05 – 2019-02-21 (×23): 1000 ug via INTRAMUSCULAR

## 2017-03-05 NOTE — Progress Notes (Signed)
Vitamin b12 injection given and patient tolerated well.  

## 2017-03-05 NOTE — Patient Instructions (Signed)
Cyanocobalamin, Vitamin B12 injection What is this medicine? CYANOCOBALAMIN (sye an oh koe BAL a min) is a man made form of vitamin B12. Vitamin B12 is used in the growth of healthy blood cells, nerve cells, and proteins in the body. It also helps with the metabolism of fats and carbohydrates. This medicine is used to treat people who can not absorb vitamin B12. This medicine may be used for other purposes; ask your health care provider or pharmacist if you have questions. COMMON BRAND NAME(S): B-12 Compliance Kit, B-12 Injection Kit, Cyomin, LA-12, Nutri-Twelve, Physicians EZ Use B-12, Primabalt What should I tell my health care provider before I take this medicine? They need to know if you have any of these conditions: -kidney disease -Leber's disease -megaloblastic anemia -an unusual or allergic reaction to cyanocobalamin, cobalt, other medicines, foods, dyes, or preservatives -pregnant or trying to get pregnant -breast-feeding How should I use this medicine? This medicine is injected into a muscle or deeply under the skin. It is usually given by a health care professional in a clinic or doctor's office. However, your doctor may teach you how to inject yourself. Follow all instructions. Talk to your pediatrician regarding the use of this medicine in children. Special care may be needed. Overdosage: If you think you have taken too much of this medicine contact a poison control center or emergency room at once. NOTE: This medicine is only for you. Do not share this medicine with others. What if I miss a dose? If you are given your dose at a clinic or doctor's office, call to reschedule your appointment. If you give your own injections and you miss a dose, take it as soon as you can. If it is almost time for your next dose, take only that dose. Do not take double or extra doses. What may interact with this medicine? -colchicine -heavy alcohol intake This list may not describe all possible  interactions. Give your health care provider a list of all the medicines, herbs, non-prescription drugs, or dietary supplements you use. Also tell them if you smoke, drink alcohol, or use illegal drugs. Some items may interact with your medicine. What should I watch for while using this medicine? Visit your doctor or health care professional regularly. You may need blood work done while you are taking this medicine. You may need to follow a special diet. Talk to your doctor. Limit your alcohol intake and avoid smoking to get the best benefit. What side effects may I notice from receiving this medicine? Side effects that you should report to your doctor or health care professional as soon as possible: -allergic reactions like skin rash, itching or hives, swelling of the face, lips, or tongue -blue tint to skin -chest tightness, pain -difficulty breathing, wheezing -dizziness -red, swollen painful area on the leg Side effects that usually do not require medical attention (report to your doctor or health care professional if they continue or are bothersome): -diarrhea -headache This list may not describe all possible side effects. Call your doctor for medical advice about side effects. You may report side effects to FDA at 1-800-FDA-1088. Where should I keep my medicine? Keep out of the reach of children. Store at room temperature between 15 and 30 degrees C (59 and 85 degrees F). Protect from light. Throw away any unused medicine after the expiration date. NOTE: This sheet is a summary. It may not cover all possible information. If you have questions about this medicine, talk to your doctor, pharmacist, or   health care provider.  2018 Elsevier/Gold Standard (2007-11-08 22:10:20)  

## 2017-03-16 ENCOUNTER — Encounter: Payer: Self-pay | Admitting: Pediatrics

## 2017-03-16 ENCOUNTER — Ambulatory Visit (INDEPENDENT_AMBULATORY_CARE_PROVIDER_SITE_OTHER): Payer: Medicare HMO | Admitting: Pediatrics

## 2017-03-16 VITALS — BP 102/68 | HR 83 | Temp 98.0°F | Ht 70.0 in | Wt 224.0 lb

## 2017-03-16 DIAGNOSIS — J449 Chronic obstructive pulmonary disease, unspecified: Secondary | ICD-10-CM | POA: Diagnosis not present

## 2017-03-16 DIAGNOSIS — M7989 Other specified soft tissue disorders: Secondary | ICD-10-CM | POA: Diagnosis not present

## 2017-03-16 DIAGNOSIS — G35 Multiple sclerosis: Secondary | ICD-10-CM

## 2017-03-16 DIAGNOSIS — R635 Abnormal weight gain: Secondary | ICD-10-CM | POA: Diagnosis not present

## 2017-03-16 DIAGNOSIS — F411 Generalized anxiety disorder: Secondary | ICD-10-CM

## 2017-03-16 DIAGNOSIS — G629 Polyneuropathy, unspecified: Secondary | ICD-10-CM

## 2017-03-16 DIAGNOSIS — R7303 Prediabetes: Secondary | ICD-10-CM | POA: Diagnosis not present

## 2017-03-16 DIAGNOSIS — E785 Hyperlipidemia, unspecified: Secondary | ICD-10-CM | POA: Diagnosis not present

## 2017-03-16 LAB — BAYER DCA HB A1C WAIVED: HB A1C (BAYER DCA - WAIVED): 6.2 % (ref <7.0)

## 2017-03-16 MED ORDER — ALBUTEROL SULFATE (2.5 MG/3ML) 0.083% IN NEBU: 2.5000 mg | INHALATION_SOLUTION | Freq: Four times a day (QID) | RESPIRATORY_TRACT | 1 refills | Status: DC | PRN

## 2017-03-16 MED ORDER — CITALOPRAM HYDROBROMIDE 20 MG PO TABS: ORAL_TABLET | ORAL | 1 refills | Status: DC

## 2017-03-16 MED ORDER — ALBUTEROL SULFATE HFA 108 (90 BASE) MCG/ACT IN AERS: 2.0000 | INHALATION_SPRAY | Freq: Four times a day (QID) | RESPIRATORY_TRACT | 1 refills | Status: DC | PRN

## 2017-03-16 MED ORDER — GABAPENTIN 600 MG PO TABS: ORAL_TABLET | ORAL | 1 refills | Status: DC

## 2017-03-16 NOTE — Progress Notes (Signed)
  Subjective:   Patient ID: [name], female    DOB: 20-Apr-1956, 61 y.o.   MRN: [id number] CC: Follow-up (6 month) med problems HPI [name] is a 61 y.o. female presenting for Follow-up (6 month)  MS: episode 2 months ago lasting about two weeks of R hand tremors, both legs with stinging feeling, R>L, one episode of enuresis Still with slight baseline L sided weakness, unchanged for several years  Feeling tired much of the time, stays hydrated, drinks 4-8 bottles of water a day  Tobacco use: didn't start chantix, has a lot of stress  Son and his 56 yo and 12yo moving in with her soon  Has been walking regularly  Breathing has been great, on symbicort  Celexa has been helping with mood, doesn't feel depressed, worrying about family as above  Relevant past medical, surgical, family and social history reviewed. Allergies and medications reviewed and updated. History  Smoking Status  . Current Every Day Smoker  . Packs/day: 0.50  . Years: 40.00  . Types: Cigarettes  Smokeless Tobacco  . Never Used    Comment: 3/4 pack a day sicne age 55   ROS: Per HPI   Objective:    BP 102/68   Pulse 83   Temp 98 F (36.7 C) (Oral)   Ht '5\' 10"'$  (1.778 m)   Wt 224 lb (101.6 kg)   BMI 32.14 kg/m   Wt Readings from Last 3 Encounters:  03/16/17 224 lb (101.6 kg)  02/24/17 225 lb (102.1 kg)  02/12/17 220 lb 14.4 oz (100.2 kg)    Gen: NAD, alert, cooperative with exam, NCAT EYES: EOMI, no conjunctival injection, or no icterus ENT:  OP without erythema LYMPH: no cervical LAD CV: NRRR, normal S1/S2, no murmur, distal pulses 2+ b/l Resp: moving air well, slight wheeze with inspiration, normal WOB Abd: +BS, soft, NTND.  Ext: No edema, warm Neuro: Alert and oriented  Assessment & Plan:  [name] was seen today for follow-up med problems  Diagnoses and all orders for this visit:  Multiple sclerosis ([place]) Has not regularly been following with neuro, new symptoms in the last  couple o fmonths, have improved now -     Ambulatory referral to Neurology  Chronic obstructive pulmonary disease, unspecified COPD type ([place]) Stable Not needed albuterol for several months Fall, winter tend to be bad times of year Cont symbicort, below as needed -     albuterol (PROVENTIL) (2.5 MG/3ML) 0.083% nebulizer solution; Take 3 mLs (2.5 mg total) by nebulization every 6 (six) hours as needed for wheezing or shortness of breath. -     albuterol (VENTOLIN HFA) 108 (90 Base) MCG/ACT inhaler; Inhale 2 puffs into the lungs every 6 (six) hours as needed. For shortness of breath  Generalized anxiety disorder Stable, cont med -     citalopram (CELEXA) 20 MG tablet; TAKE ONE (1) TABLET EACH DAY  Swelling of limb No recent LE swelling, hasnt taken lasix in 2 mo  Neuropathy Stable, below helps with symptoms -     gabapentin (NEURONTIN) 600 MG tablet; TAKE ONE TABLET FOUR TIMES DAILY  Hyperlipidemia, unspecified hyperlipidemia type -     Lipid panel  Weight gain -     BMP8+EGFR -     TSH  Pre-diabetes A1c 6.2 -     Bayer DCA Hb A1c Waived  Follow up plan: Return in about 3 months (around 06/16/2017) for med follow up. Assunta Found, MD [place]

## 2017-03-17 LAB — LIPID PANEL
CHOLESTEROL TOTAL: 238 mg/dL — AB (ref 100–199)
Chol/HDL Ratio: 4.5 ratio — ABNORMAL HIGH (ref 0.0–4.4)
HDL: 53 mg/dL (ref >39)
LDL CALC: 166 mg/dL — AB (ref 0–99)
TRIGLYCERIDES: 96 mg/dL (ref 0–149)
VLDL CHOLESTEROL CAL: 19 mg/dL (ref 5–40)

## 2017-03-17 LAB — BMP8+EGFR
BUN/Creatinine Ratio: 12 (ref 9–23)
BUN: 12 mg/dL (ref 6–24)
CALCIUM: 9.1 mg/dL (ref 8.7–10.2)
CHLORIDE: 104 mmol/L (ref 96–106)
CO2: 25 mmol/L (ref 20–29)
Creatinine, Ser: 0.99 mg/dL (ref 0.57–1.00)
GFR calc Af Amer: 73 mL/min/{1.73_m2} (ref >59)
GFR calc non Af Amer: 63 mL/min/{1.73_m2} (ref >59)
GLUCOSE: 104 mg/dL — AB (ref 65–99)
Potassium: 4.8 mmol/L (ref 3.5–5.2)
Sodium: 144 mmol/L (ref 134–144)

## 2017-03-17 LAB — TSH: TSH: 3.58 u[IU]/mL (ref 0.450–4.500)

## 2017-03-27 ENCOUNTER — Ambulatory Visit: Payer: Medicare HMO | Admitting: Neurology

## 2017-04-01 DIAGNOSIS — G35 Multiple sclerosis: Secondary | ICD-10-CM | POA: Diagnosis not present

## 2017-04-05 DIAGNOSIS — G35 Multiple sclerosis: Secondary | ICD-10-CM | POA: Diagnosis not present

## 2017-04-06 ENCOUNTER — Ambulatory Visit (INDEPENDENT_AMBULATORY_CARE_PROVIDER_SITE_OTHER): Payer: Medicare HMO | Admitting: *Deleted

## 2017-04-06 DIAGNOSIS — D518 Other vitamin B12 deficiency anemias: Secondary | ICD-10-CM

## 2017-04-06 NOTE — Progress Notes (Signed)
Pt given Cyanocobalamin inj Tolerated well 

## 2017-05-05 ENCOUNTER — Encounter: Payer: Self-pay | Admitting: Pediatrics

## 2017-05-05 ENCOUNTER — Ambulatory Visit (INDEPENDENT_AMBULATORY_CARE_PROVIDER_SITE_OTHER): Payer: Medicare HMO | Admitting: Pediatrics

## 2017-05-05 VITALS — BP 129/76 | HR 64 | Temp 99.4°F | Ht 70.0 in | Wt 224.0 lb

## 2017-05-05 DIAGNOSIS — R590 Localized enlarged lymph nodes: Secondary | ICD-10-CM | POA: Diagnosis not present

## 2017-05-05 DIAGNOSIS — Z6832 Body mass index (BMI) 32.0-32.9, adult: Secondary | ICD-10-CM

## 2017-05-05 DIAGNOSIS — J449 Chronic obstructive pulmonary disease, unspecified: Secondary | ICD-10-CM

## 2017-05-05 DIAGNOSIS — R221 Localized swelling, mass and lump, neck: Secondary | ICD-10-CM | POA: Diagnosis not present

## 2017-05-05 NOTE — Progress Notes (Signed)
  Subjective:   Patient ID: [name], female    DOB: 08-Dec-1955, 61 y.o.   MRN: [id number] CC: Neck Pain; Fatigue; and Dental Pain  HPI: [name] is a 61 y.o. female presenting for Neck Pain; Fatigue; and Dental Pain  Has noticed a neck bump Dentist said a lymph node, rec coming to doctor No night sweats, normal appetite No trouble swallowing Teeth now hurting some  Tobacco use: ongoing  Stressed over son's ongoing problems with family Not ready to quit yet  No coughing, breathing has been fine Not recently needed albuterol  Relevant past medical, surgical, family and social history reviewed. Allergies and medications reviewed and updated. History  Smoking Status  . Current Every Day Smoker  . Packs/day: 0.50  . Years: 40.00  . Types: Cigarettes  Smokeless Tobacco  . Never Used    Comment: 3/4 pack a day sicne age 36   ROS: Per HPI   Objective:    BP 129/76   Pulse 64   Temp 99.4 F (37.4 C) (Oral)   Ht '5\' 10"'$  (1.778 m)   Wt 224 lb (101.6 kg)   BMI 32.14 kg/m   Wt Readings from Last 3 Encounters:  05/05/17 224 lb (101.6 kg)  03/16/17 224 lb (101.6 kg)  02/24/17 225 lb (102.1 kg)    Gen: NAD, alert, cooperative with exam, NCAT EYES: EOMI, no conjunctival injection, or no icterus ENT:  TMs pearly gray b/l, OP without erythema LYMPH: L sided anterior cervical enlargement, compared with R side  CV: NRRR, normal S1/S2, no murmur, distal pulses 2+ b/l Resp: CTABL, no wheezes, normal WOB Abd: +BS, soft, NTND. no guarding or organomegaly Ext: No edema, warm Neuro: Alert and oriented  Assessment & Plan:  [name] was seen today for neck pain, fatigue and dental pain.  Diagnoses and all orders for this visit:  LAD (lymphadenopathy) of left cervical region Will get labs, CT scan -     CBC with Differential/Platelet -     CMP14+EGFR -     CT Soft Tissue Neck W Contrast; Future  Localized swelling, mass or lump of neck -     CT Soft Tissue Neck W  Contrast; Future  COPD  GOLD II/ AB Stable, cont symbicort Cont to encourage tobacco cessation  BMI 32.0-32.9,adult Discussed lifestyle changes, decreased sugar intake, increased physical activity  Follow up plan: Return in about 2 months (around 07/05/2017). Assunta Found, MD [place]

## 2017-05-06 LAB — CBC WITH DIFFERENTIAL/PLATELET
BASOS ABS: 0.1 10*3/uL (ref 0.0–0.2)
Basos: 1 %
EOS (ABSOLUTE): 0.2 10*3/uL (ref 0.0–0.4)
EOS: 2 %
HEMATOCRIT: 38.2 % (ref 34.0–46.6)
Hemoglobin: 12.8 g/dL (ref 11.1–15.9)
Immature Grans (Abs): 0 10*3/uL (ref 0.0–0.1)
Immature Granulocytes: 0 %
LYMPHS ABS: 3.5 10*3/uL — AB (ref 0.7–3.1)
Lymphs: 37 %
MCH: 28.6 pg (ref 26.6–33.0)
MCHC: 33.5 g/dL (ref 31.5–35.7)
MCV: 86 fL (ref 79–97)
MONOS ABS: 0.5 10*3/uL (ref 0.1–0.9)
Monocytes: 5 %
NEUTROS ABS: 5.3 10*3/uL (ref 1.4–7.0)
Neutrophils: 55 %
Platelets: 271 10*3/uL (ref 150–379)
RBC: 4.47 x10E6/uL (ref 3.77–5.28)
RDW: 13.8 % (ref 12.3–15.4)
WBC: 9.5 10*3/uL (ref 3.4–10.8)

## 2017-05-06 LAB — CMP14+EGFR
A/G RATIO: 1.8 (ref 1.2–2.2)
ALK PHOS: 73 IU/L (ref 39–117)
ALT: 10 IU/L (ref 0–32)
AST: 11 IU/L (ref 0–40)
Albumin: 4 g/dL (ref 3.5–5.5)
BILIRUBIN TOTAL: 0.3 mg/dL (ref 0.0–1.2)
BUN / CREAT RATIO: 11 (ref 9–23)
BUN: 10 mg/dL (ref 6–24)
CO2: 27 mmol/L (ref 20–29)
Calcium: 8.9 mg/dL (ref 8.7–10.2)
Chloride: 102 mmol/L (ref 96–106)
Creatinine, Ser: 0.91 mg/dL (ref 0.57–1.00)
GFR, EST AFRICAN AMERICAN: 80 mL/min/{1.73_m2} (ref >59)
GFR, EST NON AFRICAN AMERICAN: 70 mL/min/{1.73_m2} (ref >59)
GLOBULIN, TOTAL: 2.2 g/dL (ref 1.5–4.5)
Glucose: 120 mg/dL — ABNORMAL HIGH (ref 65–99)
Potassium: 4.4 mmol/L (ref 3.5–5.2)
Sodium: 144 mmol/L (ref 134–144)
Total Protein: 6.2 g/dL (ref 6.0–8.5)

## 2017-05-08 ENCOUNTER — Ambulatory Visit (INDEPENDENT_AMBULATORY_CARE_PROVIDER_SITE_OTHER): Payer: Medicare HMO | Admitting: *Deleted

## 2017-05-08 DIAGNOSIS — D518 Other vitamin B12 deficiency anemias: Secondary | ICD-10-CM | POA: Diagnosis not present

## 2017-05-08 NOTE — Progress Notes (Signed)
Pt given Cyanocobalamin inj Tolerated well 

## 2017-05-18 ENCOUNTER — Ambulatory Visit (HOSPITAL_COMMUNITY)
Admission: RE | Admit: 2017-05-18 | Discharge: 2017-05-18 | Disposition: A | Discharge status: Home / Self Care | Payer: Medicare HMO | Source: Ambulatory Visit | Attending: Pediatrics | Admitting: Pediatrics

## 2017-05-18 DIAGNOSIS — I700 Atherosclerosis of aorta: Secondary | ICD-10-CM | POA: Insufficient documentation

## 2017-05-18 DIAGNOSIS — R590 Localized enlarged lymph nodes: Secondary | ICD-10-CM | POA: Diagnosis not present

## 2017-05-18 DIAGNOSIS — R221 Localized swelling, mass and lump, neck: Secondary | ICD-10-CM | POA: Diagnosis not present

## 2017-05-18 MED ORDER — IOPAMIDOL (ISOVUE-300) INJECTION 61%
75.0000 mL | Freq: Once | INTRAVENOUS | Status: AC | PRN
  Administered 2017-05-18: 75 mL via INTRAVENOUS

## 2017-05-21 ENCOUNTER — Other Ambulatory Visit: Payer: Self-pay | Admitting: *Deleted

## 2017-05-21 MED ORDER — PITAVASTATIN CALCIUM 2 MG PO TABS: 2.0000 mg | ORAL_TABLET | Freq: Every day | ORAL | 2 refills | Status: DC

## 2017-05-28 ENCOUNTER — Telehealth: Payer: Self-pay

## 2017-05-28 NOTE — Telephone Encounter (Signed)
Insurance denied prior auth for Livalo  Must try and fail two alternatives which are Simvastatin Rosuvastatin Lovastatin and ezetimibe

## 2017-06-08 ENCOUNTER — Ambulatory Visit (INDEPENDENT_AMBULATORY_CARE_PROVIDER_SITE_OTHER): Payer: Medicare HMO | Admitting: Physician Assistant

## 2017-06-08 ENCOUNTER — Encounter: Payer: Self-pay | Admitting: Physician Assistant

## 2017-06-08 DIAGNOSIS — D518 Other vitamin B12 deficiency anemias: Secondary | ICD-10-CM

## 2017-06-08 NOTE — Progress Notes (Signed)
Pt given Cyanocobalamin inj Tolerated well 

## 2017-06-17 ENCOUNTER — Ambulatory Visit (INDEPENDENT_AMBULATORY_CARE_PROVIDER_SITE_OTHER): Payer: Medicare HMO | Admitting: Pediatrics

## 2017-06-17 ENCOUNTER — Encounter: Payer: Self-pay | Admitting: Pediatrics

## 2017-06-17 VITALS — BP 116/70 | HR 70 | Temp 98.9°F | Ht 70.0 in | Wt 235.6 lb

## 2017-06-17 DIAGNOSIS — F411 Generalized anxiety disorder: Secondary | ICD-10-CM

## 2017-06-17 DIAGNOSIS — R0683 Snoring: Secondary | ICD-10-CM

## 2017-06-17 DIAGNOSIS — J449 Chronic obstructive pulmonary disease, unspecified: Secondary | ICD-10-CM | POA: Diagnosis not present

## 2017-06-17 MED ORDER — BUDESONIDE-FORMOTEROL FUMARATE 160-4.5 MCG/ACT IN AERO: INHALATION_SPRAY | RESPIRATORY_TRACT | 11 refills | Status: DC

## 2017-06-17 NOTE — Progress Notes (Signed)
  Subjective:   Patient ID: [name], female    DOB: Jun 02, 1956, 61 y.o.   MRN: [id number] CC: Follow-up (3 month) med problems HPI: [name] is a 61 y.o. female presenting for Follow-up (3 month)  COPD: breathing has been stable Takes symbicort regularly Has cut back on walking  Swelling: bothers her more by the end of the day  Depression: stable, taking medicine daily  Snoring regularly Sometimes waking herself up from sleep Has a HA in the mornings at times Feels tired throughout the day Restless sleeping at night, lays down for apprx 8 hrs, fitbit tells her she sleeps for apprx 4 Was tested for sleep apnea years ago Told had mild sleep apnea then, didn't warrant treatment Thinks symptoms have increased since then   Relevant past medical, surgical, family and social history reviewed. Allergies and medications reviewed and updated. Social History   Tobacco Use  Smoking Status Current Every Day Smoker  . Packs/day: 0.50  . Years: 40.00  . Pack years: 20.00  . Types: Cigarettes  Smokeless Tobacco Never Used  Tobacco Comment   3/4 pack a day sicne age 55   ROS: Per HPI   Objective:    BP 116/70   Pulse 70   Temp 98.9 F (37.2 C) (Oral)   Ht 5\' 10"  (1.778 m)   Wt 235 lb 9.6 oz (106.9 kg)   BMI 33.81 kg/m   Wt Readings from Last 3 Encounters:  06/17/17 235 lb 9.6 oz (106.9 kg)  05/05/17 224 lb (101.6 kg)  03/16/17 224 lb (101.6 kg)   Gen: NAD, alert, cooperative with exam, NCAT EYES: EOMI, no conjunctival injection, or no icterus ENT:  TMs pearly gray b/l, OP without erythema LYMPH: no cervical LAD CV: NRRR, normal S1/S2, no murmur, distal pulses 2+ b/l Resp: prolonged exp phase, no crackles, normal WOB Abd: +BS, soft, NTND. no guarding or organomegaly Ext: trace to 1+ pitting edema b/l ankles, pre-tibia edema, warm Neuro: Alert and oriented Psych: nl affect  Assessment & Plan:  Jezelle was seen today for follow-up med problems.  Diagnoses  and all orders for this visit:  Snoring Referral for sleep apnea evaluation -     Ambulatory referral to Neurology  COPD mixed type ([place]) Stable, cont below -     budesonide-formoterol (SYMBICORT) 160-4.5 MCG/ACT inhaler; Take 2 puffs first thing in am and then another 2 puffs about 12 hours later.  Generalized anxiety disorder Stable, cont SSRI  LE edema Start lasix daily, compression hose If not improving let me know Follow up plan: 3 mo, sooner prn Assunta Found, MD [place]

## 2017-06-18 ENCOUNTER — Encounter: Payer: Self-pay | Admitting: Pediatrics

## 2017-06-30 ENCOUNTER — Ambulatory Visit: Payer: Medicare HMO | Admitting: Neurology

## 2017-07-09 ENCOUNTER — Ambulatory Visit (INDEPENDENT_AMBULATORY_CARE_PROVIDER_SITE_OTHER): Payer: Medicare HMO | Admitting: *Deleted

## 2017-07-09 DIAGNOSIS — D518 Other vitamin B12 deficiency anemias: Secondary | ICD-10-CM | POA: Diagnosis not present

## 2017-07-13 ENCOUNTER — Encounter: Payer: Self-pay | Admitting: Pediatrics

## 2017-07-13 ENCOUNTER — Ambulatory Visit: Payer: Medicare HMO | Admitting: Pediatrics

## 2017-07-13 VITALS — Temp 98.5°F | Ht 70.0 in | Wt 233.4 lb

## 2017-07-13 DIAGNOSIS — R609 Edema, unspecified: Secondary | ICD-10-CM | POA: Diagnosis not present

## 2017-07-13 DIAGNOSIS — R0602 Shortness of breath: Secondary | ICD-10-CM | POA: Diagnosis not present

## 2017-07-13 NOTE — Progress Notes (Signed)
  Subjective:   Patient ID: [name], female    DOB: December 04, 1955, 61 y.o.   MRN: [id number] CC: Edema (Bilateral Legs) and Redness (Bilateral legs)  HPI: [name] is a 61 y.o. female presenting for Edema (Bilateral Legs) and Redness (Bilateral legs) Swelling is usually better by the morning but gets worse throughout the day the more that she is on her feet Feels SOB with walking short distances such as to mailbox over the past few weeks No chest pain Has been taking the Lasix every morning Has ordered compression stockings but has not yet started to use them Recently has had some nasal congestion, scratchy throat, slight cough over the past few days Smoking daily, dog recently died, now with new puppy at home she is house breaking, son and his 2 kids are moving in with her in about a month Has a lot of ongoing stress Not interested in cessation at this time, open to it in the future  Has appointment for snoring evaluation  Relevant past medical, surgical, family and social history reviewed. Allergies and medications reviewed and updated. Social History   Tobacco Use  Smoking Status Current Every Day Smoker  . Packs/day: 0.50  . Years: 40.00  . Pack years: 20.00  . Types: Cigarettes  Smokeless Tobacco Never Used  Tobacco Comment   3/4 pack a day sicne age 5   ROS: Per HPI   Objective:    Temp 98.5 F (36.9 C) (Oral)   Ht '5\' 10"'$  (1.778 m)   Wt 233 lb 6.4 oz (105.9 kg)   BMI 33.49 kg/m   Wt Readings from Last 3 Encounters:  07/13/17 233 lb 6.4 oz (105.9 kg)  06/17/17 235 lb 9.6 oz (106.9 kg)  05/05/17 224 lb (101.6 kg)    Walking O2 sat 97%  Gen: NAD, alert, cooperative with exam, NCAT EYES: EOMI, no conjunctival injection, or no icterus ENT:  TMs pearly gray b/l, OP without erythema CV: NRRR, normal S1/S2, no murmur, distal pulses 2+ b/l Resp: CTABL, no wheezes, normal WOB Abd: +BS, soft, NTND. no guarding or organomegaly Ext: 1+ pitting edema  bilateral lower legs to the mid shin Neuro: Alert and oriented, strength equal b/l UE and LE, coordination grossly normal MSK: normal muscle bulk Skin: Warm, well-perfused No induration bilateral lower extremities  Assessment & Plan:  [name] was seen today for edema bilateral lower legs.  Diagnoses and all orders for this visit:  SOB (shortness of breath) With exertion, recent URI symptoms but ongoing for longer than that Given lower leg swelling, will check labs and get an echo Continue Lasix, start compression hose -     ECHOCARDIOGRAM COMPLETE; Future  Edema, unspecified type -     ECHOCARDIOGRAM COMPLETE; Future -     CMP14+EGFR -     Brain natriuretic peptide   Follow up plan: 8 weeks, sooner if needed Assunta Found, MD [place]

## 2017-07-14 LAB — CMP14+EGFR
A/G RATIO: 2 (ref 1.2–2.2)
ALT: 9 IU/L (ref 0–32)
AST: 15 IU/L (ref 0–40)
Albumin: 4 g/dL (ref 3.5–5.5)
Alkaline Phosphatase: 78 IU/L (ref 39–117)
BILIRUBIN TOTAL: 0.2 mg/dL (ref 0.0–1.2)
BUN/Creatinine Ratio: 11 (ref 9–23)
BUN: 12 mg/dL (ref 6–24)
CALCIUM: 8.6 mg/dL — AB (ref 8.7–10.2)
CHLORIDE: 103 mmol/L (ref 96–106)
CO2: 28 mmol/L (ref 20–29)
Creatinine, Ser: 1.14 mg/dL — ABNORMAL HIGH (ref 0.57–1.00)
GFR calc Af Amer: 61 mL/min/{1.73_m2} (ref >59)
GFR, EST NON AFRICAN AMERICAN: 53 mL/min/{1.73_m2} — AB (ref >59)
GLUCOSE: 152 mg/dL — AB (ref 65–99)
Globulin, Total: 2 g/dL (ref 1.5–4.5)
POTASSIUM: 4.1 mmol/L (ref 3.5–5.2)
Sodium: 144 mmol/L (ref 134–144)
Total Protein: 6 g/dL (ref 6.0–8.5)

## 2017-07-14 LAB — BRAIN NATRIURETIC PEPTIDE: BNP: 37 pg/mL (ref 0.0–100.0)

## 2017-07-21 ENCOUNTER — Other Ambulatory Visit (HOSPITAL_COMMUNITY): Payer: Medicare HMO

## 2017-07-21 ENCOUNTER — Ambulatory Visit (HOSPITAL_COMMUNITY)
Admission: RE | Admit: 2017-07-21 | Discharge: 2017-07-21 | Disposition: A | Discharge status: Home / Self Care | Payer: Medicare HMO | Source: Ambulatory Visit | Attending: Pediatrics | Admitting: Pediatrics

## 2017-07-21 DIAGNOSIS — R0602 Shortness of breath: Secondary | ICD-10-CM | POA: Insufficient documentation

## 2017-07-21 DIAGNOSIS — F17210 Nicotine dependence, cigarettes, uncomplicated: Secondary | ICD-10-CM | POA: Insufficient documentation

## 2017-07-21 DIAGNOSIS — R609 Edema, unspecified: Secondary | ICD-10-CM | POA: Insufficient documentation

## 2017-07-21 DIAGNOSIS — E782 Mixed hyperlipidemia: Secondary | ICD-10-CM | POA: Insufficient documentation

## 2017-07-21 DIAGNOSIS — R7303 Prediabetes: Secondary | ICD-10-CM | POA: Diagnosis not present

## 2017-07-21 DIAGNOSIS — J449 Chronic obstructive pulmonary disease, unspecified: Secondary | ICD-10-CM | POA: Insufficient documentation

## 2017-07-21 LAB — ECHOCARDIOGRAM COMPLETE
AVLVOTPG: 6 mmHg
CHL CUP DOP CALC LVOT VTI: 27 cm
EERAT: 9.69
EWDT: 229 ms
FS: 37 % (ref 28–44)
IVS/LV PW RATIO, ED: 0.98
LA diam end sys: 36 mm
LA diam index: 1.55 cm/m2
LA vol A4C: 70.6 ml
LA vol: 68.5 mL
LASIZE: 36 mm
LAVOLIN: 29.5 mL/m2
LV E/e' medial: 9.69
LV E/e'average: 9.69
LV PW d: 11.2 mm — AB (ref 0.6–1.1)
LV SIMPSON'S DISK: 66
LV dias vol index: 39 mL/m2
LV dias vol: 90 mL (ref 46–106)
LV e' LATERAL: 10.3 cm/s
LVOT SV: 77 mL
LVOT area: 2.84 cm2
LVOTD: 19 mm
LVOTPV: 122 cm/s
LVSYSVOL: 30 mL (ref 14–42)
LVSYSVOLIN: 13 mL/m2
MV Dec: 229
MV Peak grad: 4 mmHg
MV pk A vel: 99.8 m/s
MV pk E vel: 99.8 m/s
RV TAPSE: 19.1 mm
Stroke v: 59 ml
TDI e' lateral: 10.3
TDI e' medial: 9.57

## 2017-07-21 NOTE — Progress Notes (Signed)
*  PRELIMINARY RESULTS* Echocardiogram 2D Echocardiogram has been performed.  Samuel [place] 07/21/2017, 1:42 PM

## 2017-07-22 ENCOUNTER — Ambulatory Visit: Payer: Medicare HMO | Admitting: Pediatrics

## 2017-07-22 ENCOUNTER — Encounter: Payer: Self-pay | Admitting: Pediatrics

## 2017-07-22 VITALS — BP 129/80 | HR 68 | Temp 98.8°F | Resp 18 | Ht 70.0 in | Wt 232.0 lb

## 2017-07-22 DIAGNOSIS — J441 Chronic obstructive pulmonary disease with (acute) exacerbation: Secondary | ICD-10-CM

## 2017-07-22 MED ORDER — AZITHROMYCIN 250 MG PO TABS: ORAL_TABLET | ORAL | 0 refills | Status: DC

## 2017-07-22 MED ORDER — PREDNISONE 20 MG PO TABS: 40.0000 mg | ORAL_TABLET | Freq: Every day | ORAL | 0 refills | Status: AC

## 2017-07-22 NOTE — Progress Notes (Signed)
  Subjective:   Patient ID: [name], female    DOB: [birth year], 61 y.o.   MRN: [id number] CC: Cough and Nasal Congestion  HPI: [name] is a 61 y.o. female presenting for Cough and Nasal Congestion  Started three days ago Some wheezing, coughing a lot Coughing up green sputum Sinus pressure Has been using albuterol, has been helping some Has not taken any today Appetite down Today has been the worst day so far  Relevant past medical, surgical, family and social history reviewed. Allergies and medications reviewed and updated. Social History   Tobacco Use  Smoking Status Current Every Day Smoker  . Packs/day: 0.50  . Years: 40.00  . Pack years: 20.00  . Types: Cigarettes  Smokeless Tobacco Never Used  Tobacco Comment   3/4 pack a day sicne age 38   ROS: Per HPI   Objective:    BP 129/80   Pulse 68   Temp 98.8 F (37.1 C) (Oral)   Resp 18   Ht 5\' 10"  (1.778 m)   Wt 232 lb (105.2 kg)   SpO2 97%   BMI 33.29 kg/m   Wt Readings from Last 3 Encounters:  07/22/17 232 lb (105.2 kg)  07/13/17 233 lb 6.4 oz (105.9 kg)  06/17/17 235 lb 9.6 oz (106.9 kg)   Gen: NAD, alert, cooperative with exam, NCAT, congested EYES: EOMI, no conjunctival injection, or no icterus ENT:  TMs pearly gray b/l, OP without erythema LYMPH: no cervical LAD CV: NRRR, normal S1/S2, no murmur, distal pulses 2+ b/l Resp: end expiratory wheezes b/l, comfortable WOB Abd: +BS, soft, NTND. Ext: No edema, warm Neuro: Alert and oriented Assessment & Plan:  [name] was seen today for cough and nasal congestion.  Diagnoses and all orders for this visit:  COPD exacerbation ([place]) Treat with below, return precautions discussed Continue controller COPD medicines -     predniSONE (DELTASONE) 20 MG tablet; Take 2 tablets (40 mg total) by mouth daily with breakfast for 3 days. -     azithromycin (ZITHROMAX) 250 MG tablet; Take 2 the first day and then one each day after.   Follow up  plan: As needed Assunta Found, MD [place]

## 2017-07-27 ENCOUNTER — Institutional Professional Consult (permissible substitution): Payer: Medicare HMO | Admitting: Neurology

## 2017-08-12 ENCOUNTER — Other Ambulatory Visit: Payer: Self-pay | Admitting: Pediatrics

## 2017-08-12 ENCOUNTER — Ambulatory Visit (INDEPENDENT_AMBULATORY_CARE_PROVIDER_SITE_OTHER): Payer: Medicare HMO | Admitting: *Deleted

## 2017-08-12 DIAGNOSIS — K219 Gastro-esophageal reflux disease without esophagitis: Secondary | ICD-10-CM

## 2017-08-12 DIAGNOSIS — D518 Other vitamin B12 deficiency anemias: Secondary | ICD-10-CM

## 2017-08-12 NOTE — Progress Notes (Signed)
Vitamin b12 injection given and patient tolerated well.  

## 2017-08-12 NOTE — Patient Instructions (Signed)
Cyanocobalamin, Vitamin B12 injection What is this medicine? CYANOCOBALAMIN (sye an oh koe BAL a min) is a man made form of vitamin B12. Vitamin B12 is used in the growth of healthy blood cells, nerve cells, and proteins in the body. It also helps with the metabolism of fats and carbohydrates. This medicine is used to treat people who can not absorb vitamin B12. This medicine may be used for other purposes; ask your health care provider or pharmacist if you have questions. COMMON BRAND NAME(S): B-12 Compliance Kit, B-12 Injection Kit, Cyomin, LA-12, Nutri-Twelve, Physicians EZ Use B-12, Primabalt What should I tell my health care provider before I take this medicine? They need to know if you have any of these conditions: -kidney disease -Leber's disease -megaloblastic anemia -an unusual or allergic reaction to cyanocobalamin, cobalt, other medicines, foods, dyes, or preservatives -pregnant or trying to get pregnant -breast-feeding How should I use this medicine? This medicine is injected into a muscle or deeply under the skin. It is usually given by a health care professional in a clinic or doctor's office. However, your doctor may teach you how to inject yourself. Follow all instructions. Talk to your pediatrician regarding the use of this medicine in children. Special care may be needed. Overdosage: If you think you have taken too much of this medicine contact a poison control center or emergency room at once. NOTE: This medicine is only for you. Do not share this medicine with others. What if I miss a dose? If you are given your dose at a clinic or doctor's office, call to reschedule your appointment. If you give your own injections and you miss a dose, take it as soon as you can. If it is almost time for your next dose, take only that dose. Do not take double or extra doses. What may interact with this medicine? -colchicine -heavy alcohol intake This list may not describe all possible  interactions. Give your health care provider a list of all the medicines, herbs, non-prescription drugs, or dietary supplements you use. Also tell them if you smoke, drink alcohol, or use illegal drugs. Some items may interact with your medicine. What should I watch for while using this medicine? Visit your doctor or health care professional regularly. You may need blood work done while you are taking this medicine. You may need to follow a special diet. Talk to your doctor. Limit your alcohol intake and avoid smoking to get the best benefit. What side effects may I notice from receiving this medicine? Side effects that you should report to your doctor or health care professional as soon as possible: -allergic reactions like skin rash, itching or hives, swelling of the face, lips, or tongue -blue tint to skin -chest tightness, pain -difficulty breathing, wheezing -dizziness -red, swollen painful area on the leg Side effects that usually do not require medical attention (report to your doctor or health care professional if they continue or are bothersome): -diarrhea -headache This list may not describe all possible side effects. Call your doctor for medical advice about side effects. You may report side effects to FDA at 1-800-FDA-1088. Where should I keep my medicine? Keep out of the reach of children. Store at room temperature between 15 and 30 degrees C (59 and 85 degrees F). Protect from light. Throw away any unused medicine after the expiration date. NOTE: This sheet is a summary. It may not cover all possible information. If you have questions about this medicine, talk to your doctor, pharmacist, or   health care provider.  2018 Elsevier/Gold Standard (2007-11-08 22:10:20)  

## 2017-08-13 ENCOUNTER — Encounter: Payer: Self-pay | Admitting: Pediatrics

## 2017-08-13 ENCOUNTER — Ambulatory Visit (INDEPENDENT_AMBULATORY_CARE_PROVIDER_SITE_OTHER): Payer: Medicare HMO | Admitting: Pediatrics

## 2017-08-13 VITALS — BP 108/60 | HR 60 | Temp 97.4°F | Ht 70.0 in | Wt 231.0 lb

## 2017-08-13 DIAGNOSIS — R609 Edema, unspecified: Secondary | ICD-10-CM | POA: Diagnosis not present

## 2017-08-13 DIAGNOSIS — M25561 Pain in right knee: Secondary | ICD-10-CM

## 2017-08-13 NOTE — Progress Notes (Signed)
  Subjective:   Patient ID: [name], female    DOB: [birth year], 62 y.o.   MRN: [id number] CC: Knee Pain (Right ) and Edema  HPI: [name] is a 62 y.o. female presenting for Knee Pain (Right ) and Edema  3 days ago started having swelling in her right knee that worsened throughout the day the next day she woke up and was even more swollen  No redness, not tender to touch This morning the swelling is better The knee still feels slightly sore with walking on it compared to usual  Continues to have bilateral ankle swelling that gets worse throughout the day, improved by the morning no fevers, normal appetite  Relevant past medical, surgical, family and social history reviewed. Allergies and medications reviewed and updated. Social History   Tobacco Use  Smoking Status Current Every Day Smoker  . Packs/day: 0.50  . Years: 40.00  . Pack years: 20.00  . Types: Cigarettes  Smokeless Tobacco Never Used  Tobacco Comment   3/4 pack a day sicne age 33   ROS: Per HPI   Objective:    BP 108/60   Pulse 60   Temp (!) 97.4 F (36.3 C) (Oral)   Ht 5\' 10"  (1.778 m)   Wt 231 lb (104.8 kg)   BMI 33.15 kg/m   Wt Readings from Last 3 Encounters:  08/13/17 231 lb (104.8 kg)  07/22/17 232 lb (105.2 kg)  07/13/17 233 lb 6.4 oz (105.9 kg)   Gen: NAD, alert, cooperative with exam, NCAT EYES: EOMI, no conjunctival injection, or no icterus ENT: R TM normal CV: NRRR, normal S1/S2 Resp: CTABL, no wheezes, normal WOB Abd: +BS, soft, NTND. no guarding or organomegaly Ext: Trace to 1+ pitting edema bilateral ankles edema, warm Neuro: Alert and oriented MSK: Right knee range of motion apprx 80-180 degrees, left knee with better flexion compared to right Right knee with no redness, does have moderate effusion compared with left knee  Assessment & Plan:  Lavaya was seen today for knee pain and edema.  Diagnoses and all orders for this visit:  Acute pain of right knee No known  injury, no redness History of right knee replacement, now with more swelling Has had similar swelling in the past that resolved Discussed rest, NSAIDs, ice -     Ambulatory referral to Orthopedic Surgery  Swelling Bilateral ankle swelling, take Lasix as needed daily  Ear itching OTC hydrocortisone as needed  Follow up plan: Return if symptoms worsen or fail to improve. Assunta Found, MD [place]

## 2017-09-14 ENCOUNTER — Ambulatory Visit: Payer: Medicare HMO | Admitting: Pediatrics

## 2017-09-14 ENCOUNTER — Ambulatory Visit (INDEPENDENT_AMBULATORY_CARE_PROVIDER_SITE_OTHER): Payer: Medicare HMO | Admitting: *Deleted

## 2017-09-14 DIAGNOSIS — D518 Other vitamin B12 deficiency anemias: Secondary | ICD-10-CM

## 2017-09-14 NOTE — Progress Notes (Signed)
Pt given Cyanocobalamin inj Tolerated well 

## 2017-09-15 ENCOUNTER — Encounter: Payer: Self-pay | Admitting: Pediatrics

## 2017-09-18 ENCOUNTER — Ambulatory Visit: Payer: Medicare HMO | Admitting: Pediatrics

## 2017-09-21 ENCOUNTER — Ambulatory Visit (INDEPENDENT_AMBULATORY_CARE_PROVIDER_SITE_OTHER): Payer: Medicare HMO | Admitting: Pediatrics

## 2017-09-21 ENCOUNTER — Encounter: Payer: Self-pay | Admitting: Pediatrics

## 2017-09-21 VITALS — BP 108/72 | HR 60 | Temp 98.4°F | Ht 70.0 in | Wt 231.4 lb

## 2017-09-21 DIAGNOSIS — F411 Generalized anxiety disorder: Secondary | ICD-10-CM | POA: Diagnosis not present

## 2017-09-21 DIAGNOSIS — F17210 Nicotine dependence, cigarettes, uncomplicated: Secondary | ICD-10-CM

## 2017-09-21 DIAGNOSIS — R7303 Prediabetes: Secondary | ICD-10-CM | POA: Diagnosis not present

## 2017-09-21 DIAGNOSIS — J449 Chronic obstructive pulmonary disease, unspecified: Secondary | ICD-10-CM

## 2017-09-21 DIAGNOSIS — M1712 Unilateral primary osteoarthritis, left knee: Secondary | ICD-10-CM | POA: Diagnosis not present

## 2017-09-21 LAB — BAYER DCA HB A1C WAIVED: HB A1C (BAYER DCA - WAIVED): 6.1 % (ref <7.0)

## 2017-09-21 MED ORDER — BUDESONIDE-FORMOTEROL FUMARATE 160-4.5 MCG/ACT IN AERO: INHALATION_SPRAY | RESPIRATORY_TRACT | 11 refills | Status: DC

## 2017-09-21 NOTE — Progress Notes (Signed)
  Subjective:   Patient ID: [name], female    DOB: 1956-03-21, 62 y.o.   MRN: [id number] CC: Follow-up (3 month) multiple med problems HPI: [name] is a 62 y.o. female presenting for Follow-up (3 month)  COPD: taking symbicort regulalry. Still sometimes with productive cough. No SOB, no wheezing. Not needing albuterol regularly  Pre-diabetes: cooking at home a lot, avoiding fastfood  Anxiety: taking celexa regularly, anxiety and mood has been fine  Tobacco use: smoking regularly, trying to decrease. Husbands smokes too, neither smoke inside, has been hard to cut back.  Son and his two children now staying with her. Has been adjustment but she says has been good.  Relevant past medical, surgical, family and social history reviewed. Allergies and medications reviewed and updated. Social History   Tobacco Use  Smoking Status Current Every Day Smoker  . Packs/day: 0.50  . Years: 40.00  . Pack years: 20.00  . Types: Cigarettes  Smokeless Tobacco Never Used  Tobacco Comment   3/4 pack a day sicne age 53   ROS: Per HPI   Objective:    BP 108/72   Pulse 60   Temp 98.4 F (36.9 C) (Oral)   Ht 5\' 10"  (1.778 m)   Wt 231 lb 6.4 oz (105 kg)   BMI 33.20 kg/m   Wt Readings from Last 3 Encounters:  09/21/17 231 lb 6.4 oz (105 kg)  08/13/17 231 lb (104.8 kg)  07/22/17 232 lb (105.2 kg)    Gen: NAD, alert, cooperative with exam, NCAT EYES: EOMI, no conjunctival injection, or no icterus ENT:  TMs pearly gray b/l, OP without erythema LYMPH: no cervical LAD CV: NRRR, normal S1/S2, no murmur, distal pulses 2+ b/l Resp: CTABL, no wheezes, normal WOB Abd: +BS, soft, NTND. no guarding or organomegaly Ext: No edema, warm Neuro: Alert and oriented Assessment & Plan:  Joanne was seen today for follow-up multiple med problems  Diagnoses and all orders for this visit:  Pre-diabetes A1c 6.1, cont lifestyle changes, avoiding sugary foods -     Bayer DCA Hb A1c  Waived  COPD mixed type ([facility]) Stable. No wheezing today. Cont to encourage smoking cessation. -     budesonide-formoterol (SYMBICORT) 160-4.5 MCG/ACT inhaler; Take 2 puffs first thing in am and then another 2 puffs about 12 hours later.  Cigarette smoker  Primary osteoarthritis of left knee Has f/u with ortho next week. Had complete replacement R knee, partial L knee  Generalized anxiety disorder Stable, cont celexa  Follow up plan: Return in about 6 months (around 03/21/2018). Assunta Found, MD [place]

## 2017-10-02 DIAGNOSIS — Z471 Aftercare following joint replacement surgery: Secondary | ICD-10-CM | POA: Diagnosis not present

## 2017-10-02 DIAGNOSIS — Z96652 Presence of left artificial knee joint: Secondary | ICD-10-CM | POA: Insufficient documentation

## 2017-10-02 DIAGNOSIS — Z96651 Presence of right artificial knee joint: Secondary | ICD-10-CM | POA: Diagnosis not present

## 2017-10-02 DIAGNOSIS — M170 Bilateral primary osteoarthritis of knee: Secondary | ICD-10-CM | POA: Diagnosis not present

## 2017-10-02 DIAGNOSIS — Z96653 Presence of artificial knee joint, bilateral: Secondary | ICD-10-CM | POA: Diagnosis not present

## 2017-10-05 DIAGNOSIS — R69 Illness, unspecified: Secondary | ICD-10-CM | POA: Diagnosis not present

## 2017-10-13 ENCOUNTER — Ambulatory Visit: Payer: Medicare HMO

## 2017-10-14 ENCOUNTER — Ambulatory Visit (INDEPENDENT_AMBULATORY_CARE_PROVIDER_SITE_OTHER): Payer: Medicare HMO | Admitting: *Deleted

## 2017-10-14 DIAGNOSIS — D518 Other vitamin B12 deficiency anemias: Secondary | ICD-10-CM | POA: Diagnosis not present

## 2017-10-14 NOTE — Progress Notes (Signed)
Pt given cyanocobalamin inj Tolerated well 

## 2017-11-10 ENCOUNTER — Other Ambulatory Visit: Payer: Self-pay | Admitting: Pediatrics

## 2017-11-10 DIAGNOSIS — F411 Generalized anxiety disorder: Secondary | ICD-10-CM

## 2017-11-16 ENCOUNTER — Ambulatory Visit (INDEPENDENT_AMBULATORY_CARE_PROVIDER_SITE_OTHER): Payer: Medicare HMO | Admitting: *Deleted

## 2017-11-16 DIAGNOSIS — D518 Other vitamin B12 deficiency anemias: Secondary | ICD-10-CM

## 2017-11-16 NOTE — Progress Notes (Signed)
Pt given Cyanocobalamin inj Tolerated well 

## 2017-12-15 DIAGNOSIS — E119 Type 2 diabetes mellitus without complications: Secondary | ICD-10-CM | POA: Diagnosis not present

## 2017-12-15 DIAGNOSIS — H43393 Other vitreous opacities, bilateral: Secondary | ICD-10-CM | POA: Diagnosis not present

## 2017-12-15 DIAGNOSIS — H43811 Vitreous degeneration, right eye: Secondary | ICD-10-CM | POA: Diagnosis not present

## 2017-12-15 DIAGNOSIS — H04123 Dry eye syndrome of bilateral lacrimal glands: Secondary | ICD-10-CM | POA: Diagnosis not present

## 2017-12-15 LAB — HM DIABETES EYE EXAM

## 2017-12-15 IMAGING — DX DG CHEST 2V
2 series · 2 of 2 positions shown · non-contrast
Comparison: 03/28/2015

CLINICAL DATA: Cough and fever for several days.  Weakness.

EXAM:
CHEST  2 VIEW

[chest lat]
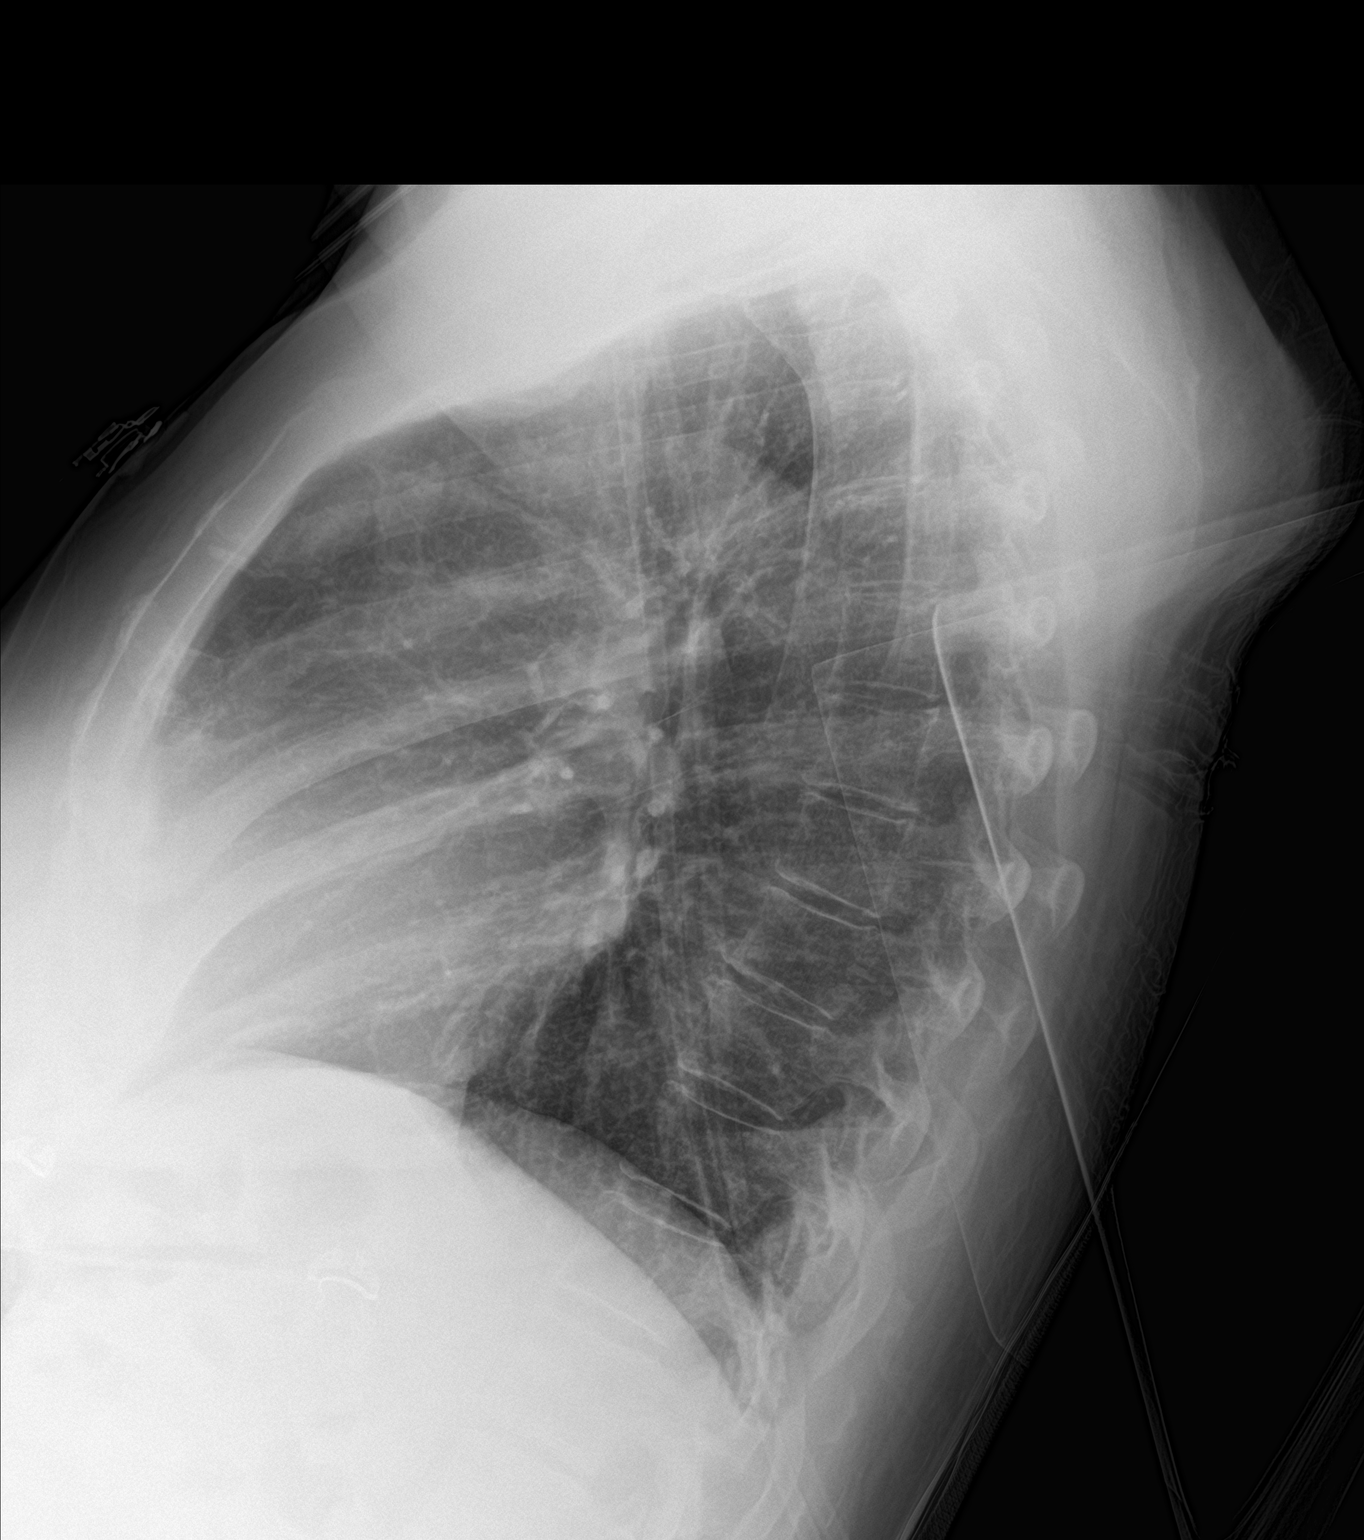

[chest ap]
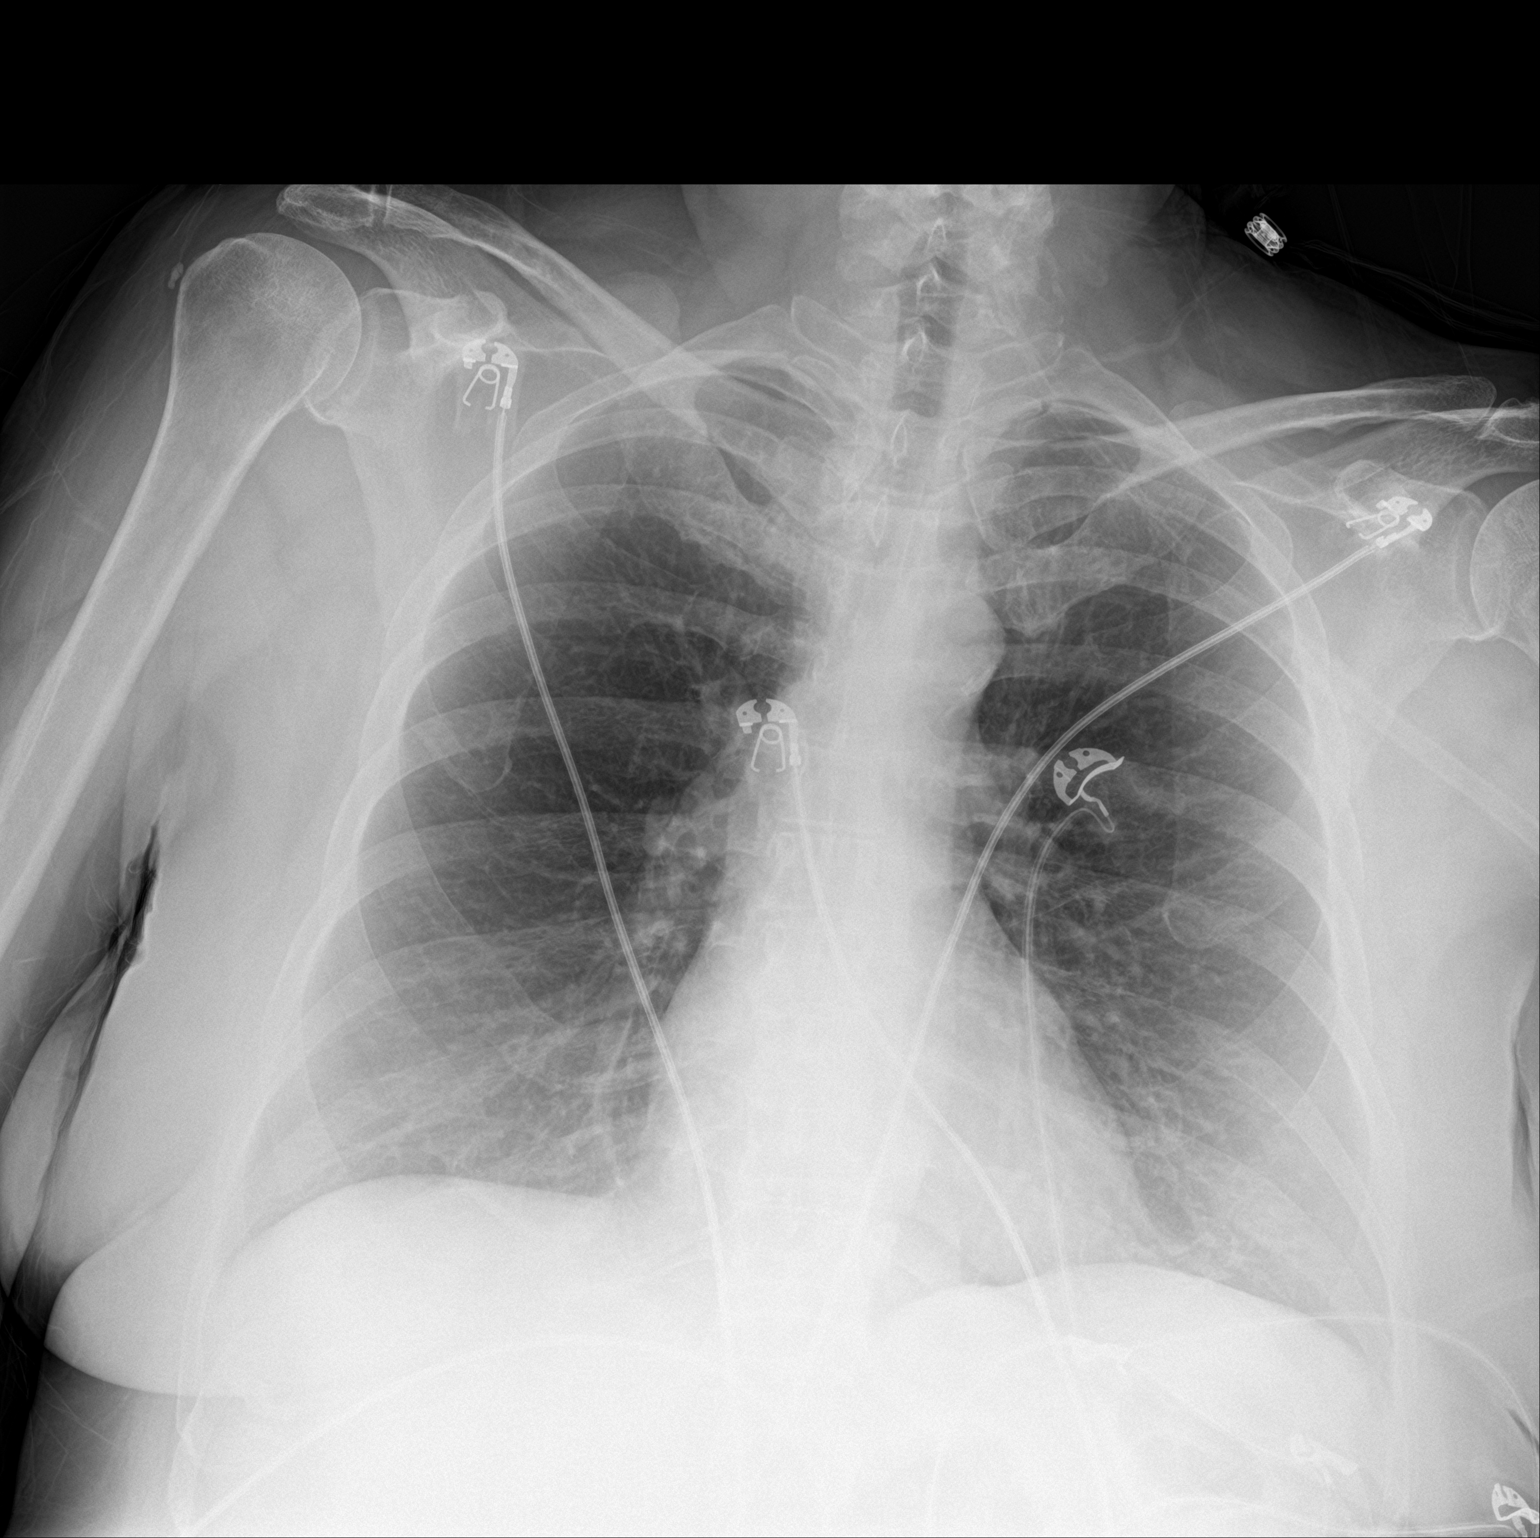

[2 of 2 positions shown; findings below may reference images not displayed]

FINDINGS: The heart size and mediastinal contours are within normal limits.
Both lungs are clear. No pleural effusion or pneumothorax. The
visualized skeletal structures are unremarkable.
IMPRESSION: No active cardiopulmonary disease.

## 2017-12-16 ENCOUNTER — Ambulatory Visit (INDEPENDENT_AMBULATORY_CARE_PROVIDER_SITE_OTHER): Payer: Medicare HMO | Admitting: *Deleted

## 2017-12-16 DIAGNOSIS — D518 Other vitamin B12 deficiency anemias: Secondary | ICD-10-CM | POA: Diagnosis not present

## 2017-12-16 NOTE — Progress Notes (Signed)
Pt given Cyanocobalamin inj Tolerated well 

## 2017-12-28 ENCOUNTER — Encounter: Payer: Self-pay | Admitting: *Deleted

## 2018-01-15 ENCOUNTER — Ambulatory Visit (INDEPENDENT_AMBULATORY_CARE_PROVIDER_SITE_OTHER): Payer: Medicare HMO | Admitting: *Deleted

## 2018-01-15 DIAGNOSIS — D518 Other vitamin B12 deficiency anemias: Secondary | ICD-10-CM

## 2018-01-15 DIAGNOSIS — C44509 Unspecified malignant neoplasm of skin of other part of trunk: Secondary | ICD-10-CM | POA: Insufficient documentation

## 2018-01-15 DIAGNOSIS — C44599 Other specified malignant neoplasm of skin of other part of trunk: Secondary | ICD-10-CM | POA: Insufficient documentation

## 2018-01-15 NOTE — Progress Notes (Signed)
Pt given cyanocobalamin inj Tolerated well 

## 2018-02-09 ENCOUNTER — Other Ambulatory Visit: Payer: Self-pay | Admitting: Pediatrics

## 2018-02-09 DIAGNOSIS — K219 Gastro-esophageal reflux disease without esophagitis: Secondary | ICD-10-CM

## 2018-02-09 DIAGNOSIS — F411 Generalized anxiety disorder: Secondary | ICD-10-CM

## 2018-02-09 NOTE — Telephone Encounter (Signed)
OV 09/21/17 states 6 mos rck

## 2018-02-15 ENCOUNTER — Ambulatory Visit (INDEPENDENT_AMBULATORY_CARE_PROVIDER_SITE_OTHER): Payer: Medicare HMO | Admitting: *Deleted

## 2018-02-15 DIAGNOSIS — D518 Other vitamin B12 deficiency anemias: Secondary | ICD-10-CM | POA: Diagnosis not present

## 2018-02-15 NOTE — Progress Notes (Signed)
Pt given B12 injection IM right deltoid and tolerated well. 

## 2018-03-01 ENCOUNTER — Encounter: Payer: Self-pay | Admitting: Physician Assistant

## 2018-03-01 ENCOUNTER — Ambulatory Visit (INDEPENDENT_AMBULATORY_CARE_PROVIDER_SITE_OTHER): Payer: Medicare HMO | Admitting: Physician Assistant

## 2018-03-01 VITALS — BP 112/60 | HR 66 | Temp 98.6°F | Ht 70.0 in | Wt 238.2 lb

## 2018-03-01 DIAGNOSIS — R0600 Dyspnea, unspecified: Secondary | ICD-10-CM | POA: Diagnosis not present

## 2018-03-01 DIAGNOSIS — M7989 Other specified soft tissue disorders: Secondary | ICD-10-CM

## 2018-03-01 MED ORDER — FUROSEMIDE 20 MG PO TABS: 20.0000 mg | ORAL_TABLET | Freq: Every day | ORAL | 6 refills | Status: DC

## 2018-03-02 NOTE — Progress Notes (Signed)
BP 112/60   Pulse 66   Temp 98.6 F (37 C) (Oral)   Ht 5' 10"  (1.778 m)   Wt 238 lb 3.2 oz (108 kg)   BMI 34.18 kg/m    Subjective:    Patient ID: [name], female    DOB: 02-25-1956, 62 y.o.   MRN: [id number]  HPI: [name] is a 62 y.o. female presenting on 03/01/2018 for Edema (bilateral)  This patient comes in from having some significant lower leg edema for the last few weeks.  Has been quite significant in the last couple days.  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  She does not have heart failure or any other heart conditions that she is aware of.  All of her medications are reviewed.  None of her medications in the past however caused her to have swelling.  She does take a fluid pill.  She is not experiencing any chest pain or shortness of breath.  Past Medical History:  Diagnosis Date  . Anxiety   . Arthritis   . Cancer ([place])    SKIN / "BORDERLINE CERVICAL CANCER"  . COPD (chronic obstructive pulmonary disease) ([place])   . GERD (gastroesophageal reflux disease)   . History of skin cancer   . Mixed hyperlipidemia   . Multiple sclerosis ([place])   . Neuromuscular disorder ([facility])    MIGRAINES  . Neuropathy    FEET  . PONV (postoperative nausea and vomiting)   . Pre-diabetes   . Psoriasis   . Weakness of left side of body    DUE TO MS   Relevant past medical, surgical, family and social history reviewed and updated as indicated. Interim medical history since our last visit reviewed. Allergies and medications reviewed and updated. DATA REVIEWED: CHART IN EPIC  Family History reviewed for pertinent findings.  Review of Systems  Constitutional: Negative.  Negative for activity change, fatigue and fever.  HENT: Negative.   Eyes: Negative.   Respiratory: Negative.  Negative for cough, shortness of breath and wheezing.   Cardiovascular: Positive for leg swelling. Negative for chest pain and palpitations.  Gastrointestinal: Negative.  Negative for  abdominal pain.  Endocrine: Negative.   Genitourinary: Negative.  Negative for dysuria.  Musculoskeletal: Negative.   Skin: Negative.   Neurological: Negative.     Allergies as of 03/01/2018      Reactions   Fenofibrate Other (See Comments)   Unknown unknown   Penicillin G Other (See Comments)   Unknown   Pravastatin Other (See Comments)   "flu like symptoms"    Penicillins Other (See Comments)   Mouth gets blisters      Medication List        Accurate as of 03/01/18 11:59 PM. Always use your most recent med list.          albuterol (2.5 MG/3ML) 0.083% nebulizer solution Commonly known as:  PROVENTIL Take 3 mLs (2.5 mg total) by nebulization every 6 (six) hours as needed for wheezing or shortness of breath.   albuterol 108 (90 Base) MCG/ACT inhaler Commonly known as:  VENTOLIN HFA Inhale 2 puffs into the lungs every 6 (six) hours as needed. For shortness of breath   budesonide-formoterol 160-4.5 MCG/ACT inhaler Commonly known as:  SYMBICORT Take 2 puffs first thing in am and then another 2 puffs about 12 hours later.   cholecalciferol 1000 units tablet Commonly known as:  VITAMIN D Take 1,000 Units by mouth daily.  citalopram 20 MG tablet Commonly known as:  CELEXA TAKE ONE (1) TABLET EACH DAY   cycloSPORINE 0.05 % ophthalmic emulsion Commonly known as:  RESTASIS 1 drop 2 (two) times daily.   furosemide 20 MG tablet Commonly known as:  LASIX Take 1-2 tablets (20-40 mg total) by mouth daily. As needed   gabapentin 600 MG tablet Commonly known as:  NEURONTIN TAKE ONE TABLET FOUR TIMES DAILY   loratadine 10 MG tablet Commonly known as:  CLARITIN Take 10 mg daily by mouth.   Melatonin 10 MG Tabs Take 1 tablet by mouth at bedtime.   pantoprazole 40 MG tablet Commonly known as:  PROTONIX TAKE ONE (1) TABLET EACH DAY          Objective:    BP 112/60   Pulse 66   Temp 98.6 F (37 C) (Oral)   Ht 5' 10"  (1.778 m)   Wt 238 lb 3.2 oz (108 kg)    BMI 34.18 kg/m   Allergies  Allergen Reactions  . Fenofibrate Other (See Comments)    Unknown unknown  . Penicillin G Other (See Comments)    Unknown  . Pravastatin Other (See Comments)    "flu like symptoms"   . Penicillins Other (See Comments)    Mouth gets blisters    Wt Readings from Last 3 Encounters:  03/01/18 238 lb 3.2 oz (108 kg)  09/21/17 231 lb 6.4 oz (105 kg)  08/13/17 231 lb (104.8 kg)    Physical Exam  Constitutional: She is oriented to person, place, and time. She appears well-developed and well-nourished.  HENT:  Head: Normocephalic and atraumatic.  Eyes: Pupils are equal, round, and reactive to light. Conjunctivae and EOM are normal.  Cardiovascular: Normal rate, regular rhythm, normal heart sounds and intact distal pulses.  2+ pitting edema at the pretibial  Pulmonary/Chest: Effort normal and breath sounds normal.  Abdominal: Soft. Bowel sounds are normal.  Neurological: She is alert and oriented to person, place, and time. She has normal reflexes.  Skin: Skin is warm and dry. No rash noted.  Psychiatric: She has a normal mood and affect. Her behavior is normal. Judgment and thought content normal.        Assessment & Plan:   1. Swelling of limb - furosemide (LASIX) 20 MG tablet; Take 1-2 tablets (20-40 mg total) by mouth daily. As needed  Dispense: 60 tablet; Refill: 6 - CMP14+EGFR; Future  2. Dyspnea, unspecified type - CMP14+EGFR; Future   Continue all other maintenance medications as listed above.  Follow up plan: No follow-ups on file.  Educational handout given for [place] PA-C [place] [address]  [place], [place] [address] [phone]   03/02/2018, 2:47 PM

## 2018-03-03 ENCOUNTER — Other Ambulatory Visit: Payer: Medicare HMO

## 2018-03-03 DIAGNOSIS — M7989 Other specified soft tissue disorders: Secondary | ICD-10-CM | POA: Diagnosis not present

## 2018-03-03 DIAGNOSIS — R0600 Dyspnea, unspecified: Secondary | ICD-10-CM

## 2018-03-04 LAB — CMP14+EGFR
A/G RATIO: 2 (ref 1.2–2.2)
ALK PHOS: 82 IU/L (ref 39–117)
ALT: 10 IU/L (ref 0–32)
AST: 11 IU/L (ref 0–40)
Albumin: 4.1 g/dL (ref 3.5–5.5)
BILIRUBIN TOTAL: 0.4 mg/dL (ref 0.0–1.2)
BUN/Creatinine Ratio: 9 (ref 9–23)
BUN: 9 mg/dL (ref 6–24)
CHLORIDE: 98 mmol/L (ref 96–106)
CO2: 27 mmol/L (ref 20–29)
Calcium: 9.1 mg/dL (ref 8.7–10.2)
Creatinine, Ser: 1.01 mg/dL — ABNORMAL HIGH (ref 0.57–1.00)
GFR calc non Af Amer: 61 mL/min/{1.73_m2} (ref >59)
GFR, EST AFRICAN AMERICAN: 70 mL/min/{1.73_m2} (ref >59)
GLUCOSE: 134 mg/dL — AB (ref 65–99)
Globulin, Total: 2.1 g/dL (ref 1.5–4.5)
POTASSIUM: 4.3 mmol/L (ref 3.5–5.2)
Sodium: 141 mmol/L (ref 134–144)
Total Protein: 6.2 g/dL (ref 6.0–8.5)

## 2018-03-15 ENCOUNTER — Other Ambulatory Visit: Payer: Self-pay | Admitting: Pediatrics

## 2018-03-15 DIAGNOSIS — G629 Polyneuropathy, unspecified: Secondary | ICD-10-CM

## 2018-03-16 NOTE — Telephone Encounter (Signed)
Ov 04/16/18

## 2018-03-19 ENCOUNTER — Ambulatory Visit (INDEPENDENT_AMBULATORY_CARE_PROVIDER_SITE_OTHER): Payer: Medicare HMO | Admitting: *Deleted

## 2018-03-19 DIAGNOSIS — D518 Other vitamin B12 deficiency anemias: Secondary | ICD-10-CM

## 2018-03-19 NOTE — Progress Notes (Signed)
Pt given Cyanocobalamin inj Tolerated well 

## 2018-03-29 ENCOUNTER — Ambulatory Visit (INDEPENDENT_AMBULATORY_CARE_PROVIDER_SITE_OTHER): Payer: Medicare HMO | Admitting: *Deleted

## 2018-03-29 ENCOUNTER — Ambulatory Visit (INDEPENDENT_AMBULATORY_CARE_PROVIDER_SITE_OTHER): Payer: Medicare HMO

## 2018-03-29 VITALS — BP 112/62 | HR 67 | Temp 99.0°F | Ht 70.0 in | Wt 238.0 lb

## 2018-03-29 DIAGNOSIS — Z780 Asymptomatic menopausal state: Secondary | ICD-10-CM

## 2018-03-29 DIAGNOSIS — M8589 Other specified disorders of bone density and structure, multiple sites: Secondary | ICD-10-CM | POA: Diagnosis not present

## 2018-03-29 DIAGNOSIS — Z13820 Encounter for screening for osteoporosis: Secondary | ICD-10-CM

## 2018-03-29 DIAGNOSIS — Z0000 Encounter for general adult medical examination without abnormal findings: Secondary | ICD-10-CM | POA: Diagnosis not present

## 2018-03-29 DIAGNOSIS — Z23 Encounter for immunization: Secondary | ICD-10-CM

## 2018-03-29 NOTE — Patient Instructions (Signed)
  [name] , Thank you for taking time to come for your Medicare Wellness Visit. I appreciate your ongoing commitment to your health goals. Please review the following plan we discussed and let me know if I can assist you in the future.   These are the goals we discussed: Goals    . Prevent falls    . Weight (lb) < 200 lb (90.7 kg)     Stay active        This is a list of the screening recommended for you and due dates:  Health Maintenance  Topic Date Due  . HIV Screening  06/16/1973  . Flu Shot  03/11/2018  . Pap Smear  09/17/2023*  . Stool Blood Test  09/14/2025*  . Mammogram  04/18/2018  . Tetanus Vaccine  10/02/2024  . Colon Cancer Screening  08/01/2025  .  Hepatitis C: One time screening is recommended by Center for Disease Control  (CDC) for  adults born from [birth year] through 1963.   Completed  *Topic was postponed. The date shown is not the original due date.

## 2018-03-29 NOTE — Progress Notes (Addendum)
Subjective:   [name] is a 62 y.o. female who presents for an Initial Medicare Annual Wellness Visit. She is out of work due to disability. She used to work for [employer]. She enjoys art, crafting, reading and gardening. For exercise, she walks 30 minutes a day, seven days a week. She states that her diet is not very healthy and she gets in 3 meals a day. She is currently not active in church, but looking to go back. She is married, has 2 sons and 9 grandchildren. She has 2 dogs and fall risks were discussed today.    Cardiac Risk Factors include: dyslipidemia     Objective:    Today's Vitals   03/29/18 0857  BP: 112/62  Pulse: 67  Temp: 99 F (37.2 C)  TempSrc: Oral  Weight: 238 lb (108 kg)  Height: 5\' 10"  (1.778 m)   Body mass index is 34.15 kg/m.  Advanced Directives 03/29/2018 02/12/2017 06/14/2016 06/14/2016 08/02/2015 01/22/2015 01/22/2015  Does Patient Have a Medical Advance Directive? Yes Yes Yes Yes No;Yes Yes -  Type of Paramedic of [place];Living will Out of facility DNR (pink MOST or yellow form) [place];Living will [place];Living will;Out of facility DNR (pink MOST or yellow form) Living will Living will -  Does patient want to make changes to medical advance directive? No - Patient declined - No - Patient declined - - No - Patient declined -  Copy of [place] in Chart? No - copy requested - No - copy requested - - Yes Yes    Current Medications (verified) Outpatient Encounter Medications as of 03/29/2018  Medication Sig  . albuterol (PROVENTIL) (2.5 MG/3ML) 0.083% nebulizer solution Take 3 mLs (2.5 mg total) by nebulization every 6 (six) hours as needed for wheezing or shortness of breath.  Marland Kitchen albuterol (VENTOLIN HFA) 108 (90 Base) MCG/ACT inhaler Inhale 2 puffs into the lungs every 6 (six) hours as needed. For shortness of breath  . budesonide-formoterol (SYMBICORT) 160-4.5  MCG/ACT inhaler Take 2 puffs first thing in am and then another 2 puffs about 12 hours later.  . cholecalciferol (VITAMIN D) 1000 units tablet Take 1,000 Units by mouth daily.  . citalopram (CELEXA) 20 MG tablet TAKE ONE (1) TABLET EACH DAY  . cycloSPORINE (RESTASIS) 0.05 % ophthalmic emulsion 1 drop 2 (two) times daily.  . folic acid (FOLVITE) 935 MCG tablet Take 400 mcg by mouth daily.  . furosemide (LASIX) 20 MG tablet Take 1-2 tablets (20-40 mg total) by mouth daily. As needed  . gabapentin (NEURONTIN) 600 MG tablet TAKE ONE TABLET FOUR TIMES DAILY  . Melatonin 10 MG TABS Take 1 tablet by mouth at bedtime.  . pantoprazole (PROTONIX) 40 MG tablet TAKE ONE (1) TABLET EACH DAY  . loratadine (CLARITIN) 10 MG tablet Take 10 mg daily by mouth.   Facility-Administered Encounter Medications as of 03/29/2018  Medication  . cyanocobalamin ((VITAMIN B-12)) injection 1,000 mcg    Allergies (verified) Fenofibrate; Penicillin g; and Pravastatin   History: Past Medical History:  Diagnosis Date  . Allergy    seasonal   . Anxiety   . Arthritis   . Cancer ([place])    SKIN / "BORDERLINE CERVICAL CANCER"  . COPD (chronic obstructive pulmonary disease) ([place])   . GERD (gastroesophageal reflux disease)   . History of skin cancer   . Mixed hyperlipidemia   . Multiple sclerosis ([place])   . Neuromuscular disorder ([place])  MIGRAINES  . Neuropathy    FEET  . PONV (postoperative nausea and vomiting)   . Pre-diabetes   . Psoriasis   . Weakness of left side of body    DUE TO MS   Past Surgical History:  Procedure Laterality Date  . ABDOMINAL HYSTERECTOMY    . CHOLECYSTECTOMY    . COLONOSCOPY N/A 08/02/2015   Procedure: COLONOSCOPY;  Surgeon: Rogene Houston, MD;  Location[facility];  Service: Endoscopy;  Laterality: N/A;  12:00-moved to 1220 Ann to notify pt  . KNEE ARTHROSCOPY     RT  . LAPAROSCOPIC APPENDECTOMY N/A 02/12/2017   Procedure: APPENDECTOMY LAPAROSCOPIC;  Surgeon: Aviva Signs,  MD;  Location: [facility];  Service: General;  Laterality: N/A;  . Left breast lumpectomy    . Left carpal tunnel release    . PARTIAL KNEE ARTHROPLASTY Left 01/22/2015   Procedure: LEFT KNEE MEDIAL UNICOMPARTMENTAL KNEE;  Surgeon: Gaynelle Arabian, MD;  Location: [facility];  Service: Orthopedics;  Laterality: Left;  . Right total knee replacement  2010  . SHOULDER SURGERY Left   . Sinus cyst removal    . TOTAL KNEE REVISION  05/05/2012   Procedure: TOTAL KNEE REVISION;  Surgeon: Gearlean Alf, MD;  Location: [facility];  Service: Orthopedics;  Laterality: Right;  . WRIST SURGERY     rt   Family History  Problem Relation Age of Onset  . Dementia Mother   . Thyroid disease Mother        goiter   . Diabetes Mother   . Atrial fibrillation Father        Died with pulmonary embolus  . Emphysema Father   . Heart disease Father   . Other Father        blood clots   . Celiac disease Father   . GI problems Father        dumping syndrome - malabsorbtion   . Cancer - Colon Maternal Grandmother   . Dementia Maternal Grandmother   . Heart disease Maternal Grandfather   . Stroke Maternal Grandfather   . Other Sister        hereditary = blood clots   . Parkinson's disease Brother   . Alcohol abuse Brother   . Arthritis Son   . Heart attack Paternal Grandmother   . Other Paternal Grandfather        appendix rupture  . Hypertension Brother   . Hypertension Son    Social History   Socioeconomic History  . Marital status: Married    Spouse name: Audry Pili   . Number of children: 2  . Years of education: Not on file  . Highest education level: Not on file  Occupational History  . Occupation: diabled    Comment: Designer, multimedia  Social Needs  . Financial resource strain: Not on file  . Food insecurity:    Worry: Not on file    Inability: Not on file  . Transportation needs:    Medical: Not on file    Non-medical: Not on file  Tobacco Use  . Smoking status: Current Every Day Smoker     Packs/day: 0.50    Years: 40.00    Pack years: 20.00    Types: Cigarettes  . Smokeless tobacco: Never Used  . Tobacco comment: 3/4 pack a day sicne age 64  Substance and Sexual Activity  . Alcohol use: No    Alcohol/week: 0.0 standard drinks  . Drug use: No  . Sexual  activity: Not on file  Lifestyle  . Physical activity:    Days per week: Not on file    Minutes per session: Not on file  . Stress: Not on file  Relationships  . Social connections:    Talks on phone: Not on file    Gets together: Not on file    Attends religious service: Not on file    Active member of club or organization: Not on file    Attends meetings of clubs or organizations: Not on file    Relationship status: Not on file  Other Topics Concern  . Not on file  Social History Narrative   2 children  - 9 grandchildren     Tobacco Counseling Ready to quit: Not Answered Counseling given: Not Answered Comment: 3/4 pack a day sicne age 54    Activities of Daily Living In your present state of health, do you have any difficulty performing the following activities: 03/29/2018  Hearing? N  Vision? Y  Comment wears RX glasses   Difficulty concentrating or making decisions? N  Walking or climbing stairs? Y  Comment stairs are hard  Dressing or bathing? N  Doing errands, shopping? N  Preparing Food and eating ? N  Using the Toilet? N  In the past six months, have you accidently leaked urine? N  Do you have problems with loss of bowel control? N  Managing your Medications? N  Managing your Finances? N  Housekeeping or managing your Housekeeping? N  Some recent data might be hidden     Immunizations and Health Maintenance Immunization History  Administered Date(s) Administered  . Influenza,inj,Quad PF,6+ Mos 06/06/2013  . Pneumococcal Polysaccharide-23 09/12/2013  . Tdap 10/02/2014   Health Maintenance Due  Topic Date Due  . HIV Screening  06/16/1973  . INFLUENZA VACCINE  03/11/2018     Patient Care Team: [name], MD as PCP - General (Pediatrics) Gaynelle Arabian, MD as Surgeon (Orthopedic Surgery) Ellwood Dense., MD as Referring Physician (Neurology) Roseanne Kaufman, MD as Consulting Physician (Orthopedic Surgery) Rogene Houston, MD as Consulting Physician (Gastroenterology) Tanda Rockers, MD as Consulting Physician (Pulmonary Disease) Satira Sark, MD as Consulting Physician (Cardiology) Weber Cooks, MD as Referring Physician (Orthopedic Surgery)  Indicate any recent Medical Services you may have received from other than [facility] providers in the past year (date may be approximate).     Assessment:   This is a routine wellness examination for [name].  Hearing/Vision screen No exam data present  Dietary issues and exercise activities discussed: Current Exercise Habits: Home exercise routine, Type of exercise: walking, Time (Minutes): 30, Frequency (Times/Week): 7, Weekly Exercise (Minutes/Week): 210, Intensity: Mild, Exercise limited by: None identified  Goals    . Prevent falls    . Weight (lb) < 200 lb (90.7 kg)     Stay active       Depression Screen PHQ 2/9 Scores 03/29/2018 03/01/2018 09/21/2017 08/13/2017 07/22/2017 07/13/2017 06/17/2017  PHQ - 2 Score 0 4 0 0 0 2 3  PHQ- 9 Score - 14 - - - 9 12    Fall Risk Fall Risk  03/29/2018 03/01/2018 09/21/2017 08/13/2017 07/22/2017  Falls in the past year? No No No No No  Number falls in past yr: - - - - -  Injury with Fall? - - - - -  Comment - - - - -    Is the patient's home free of loose throw rugs in walkways, pet beds, electrical  cords, etc?   Fall risk and hazards were discussed today    Cognitive Function: MMSE - Mini Mental State Exam 03/29/2018  Orientation to time 5  Orientation to Place 5  Registration 3  Attention/ Calculation 5  Recall 2  Language- name 2 objects 2  Language- repeat 1  Language- follow 3 step command 3  Language- read & follow direction 1  Write a  sentence 1  Copy design 1  Total score 29        Screening Tests Health Maintenance  Topic Date Due  . HIV Screening  06/16/1973  . INFLUENZA VACCINE  03/11/2018  . PAP SMEAR  09/17/2023 (Originally 06/17/1979)  . COLON CANCER SCREENING ANNUAL FOBT  09/14/2025 (Originally 10/30/2015)  . MAMMOGRAM  04/18/2018  . TETANUS/TDAP  10/02/2024  . COLONOSCOPY  08/01/2025  . Hepatitis C Screening  Completed    Qualifies for Shingles Vaccine? Discussed and declined today  Cancer Screenings: Lung: Low Dose CT Chest recommended if Age 81-80 years, 30 pack-year currently smoking OR have quit w/in 15years. Patient does qualify. Breast: Up to date on Mammogram? No   Up to date of Bone Density/Dexa? Yes Colorectal: given today   Additional Screenings: declined  Hepatitis C Screening:      Plan:   pt is to keep follow up with Dr Evette Doffing and other specialist. She was given a Prevnar 9 today  She had a DEXA while here today. She declined mammo and pap at this time.  I have personally reviewed and noted the following in the patient's chart:   . Medical and social history . Use of alcohol, tobacco or illicit drugs  . Current medications and supplements . Functional ability and status . Nutritional status . Physical activity . Advanced directives . List of other physicians . Hospitalizations, surgeries, and ER visits in previous 12 months . Vitals . Screenings to include cognitive, depression, and falls . Referrals and appointments  In addition, I have reviewed and discussed with patient certain preventive protocols, quality metrics, and best practice recommendations. A written personalized care plan for preventive services as well as general preventive health recommendations were provided to patient.     Oaklen Thiam, Cameron Proud, LPN   [id number]     I have reviewed and agree with the above AWV documentation.   Assunta Found, MD [facility] 03/29/2018,  10:27 AM

## 2018-04-14 ENCOUNTER — Ambulatory Visit: Payer: Medicare HMO | Admitting: Pediatrics

## 2018-04-15 ENCOUNTER — Ambulatory Visit (INDEPENDENT_AMBULATORY_CARE_PROVIDER_SITE_OTHER): Payer: Medicare HMO | Admitting: Pediatrics

## 2018-04-15 ENCOUNTER — Encounter: Payer: Self-pay | Admitting: Pediatrics

## 2018-04-15 VITALS — BP 116/67 | HR 76 | Temp 98.4°F | Ht 70.0 in | Wt 238.2 lb

## 2018-04-15 DIAGNOSIS — F411 Generalized anxiety disorder: Secondary | ICD-10-CM

## 2018-04-15 DIAGNOSIS — G629 Polyneuropathy, unspecified: Secondary | ICD-10-CM | POA: Diagnosis not present

## 2018-04-15 DIAGNOSIS — K219 Gastro-esophageal reflux disease without esophagitis: Secondary | ICD-10-CM | POA: Diagnosis not present

## 2018-04-15 DIAGNOSIS — Z6834 Body mass index (BMI) 34.0-34.9, adult: Secondary | ICD-10-CM

## 2018-04-15 DIAGNOSIS — R7303 Prediabetes: Secondary | ICD-10-CM | POA: Diagnosis not present

## 2018-04-15 LAB — BAYER DCA HB A1C WAIVED: HB A1C (BAYER DCA - WAIVED): 6.3 % (ref <7.0)

## 2018-04-15 MED ORDER — GABAPENTIN 600 MG PO TABS: 600.0000 mg | ORAL_TABLET | Freq: Three times a day (TID) | ORAL | 5 refills | Status: DC

## 2018-04-15 MED ORDER — CITALOPRAM HYDROBROMIDE 20 MG PO TABS: ORAL_TABLET | ORAL | 1 refills | Status: DC

## 2018-04-15 MED ORDER — FOLIC ACID 800 MCG PO TABS: 400.0000 ug | ORAL_TABLET | Freq: Every day | ORAL | 1 refills | Status: DC

## 2018-04-15 MED ORDER — PANTOPRAZOLE SODIUM 40 MG PO TBEC: DELAYED_RELEASE_TABLET | ORAL | 1 refills | Status: DC

## 2018-04-15 NOTE — Progress Notes (Signed)
  Subjective:   Patient ID: STORMI VANDEVELDE, female    DOB: [birth year], 62 y.o.   MRN: [id number] CC: Medical Management of Chronic Issues (6 month)  HPI: JAIDYNN BALSTER is a 62 y.o. female   Elevated BMI: Frustrated with lack of progress despite weight loss efforts with improved physical activity, avoiding unhealthy foods.   Neuropathy: Decreased gabapentin down to 2-3 times a day.  Possibly helped some with daytime sleepiness.  Anxiety: Taking citalopram regularly.  Symptoms well controlled.  GERD: Rarely needing the pantoprazole.  Relevant past medical, surgical, family and social history reviewed. Allergies and medications reviewed and updated. Social History   Tobacco Use  Smoking Status Current Every Day Smoker  . Packs/day: 0.50  . Years: 40.00  . Pack years: 20.00  . Types: Cigarettes  Smokeless Tobacco Never Used  Tobacco Comment   3/4 pack a day sicne age 27   ROS: Per HPI   Objective:    BP 116/67   Pulse 76   Temp 98.4 F (36.9 C) (Oral)   Ht 5\' 10"  (1.778 m)   Wt 238 lb 3.2 oz (108 kg)   BMI 34.18 kg/m   Wt Readings from Last 3 Encounters:  04/15/18 238 lb 3.2 oz (108 kg)  03/29/18 238 lb (108 kg)  03/01/18 238 lb 3.2 oz (108 kg)    Gen: NAD, alert, cooperative with exam, NCAT EYES: EOMI, no conjunctival injection, or no icterus ENT:  OP without erythema LYMPH: no cervical LAD CV: NRRR, normal S1/S2, no murmur, distal pulses 2+ b/l Resp: CTABL, no wheezes, normal WOB Ext: No edema, warm Neuro: Alert and oriented, strength equal b/l UE and LE, coordination grossly normal MSK: normal muscle bulk  Assessment & Plan:  Akua was seen today for medical management of chronic issues.  Diagnoses and all orders for this visit:  Pre-diabetes A1c 6.3.  Continue lifestyle changes.  Will refer to nutrition. -     Bayer DCA Hb A1c Waived  Gastroesophageal reflux disease without esophagitis Symptoms stable.  Take below as needed. -     pantoprazole  (PROTONIX) 40 MG tablet; TAKE ONE (1) TABLET EACH DAY  Neuropathy Continue to decrease below, down to twice a day.  May be able to decrease further if no worsening in symptoms of neuropathy. -     gabapentin (NEURONTIN) 600 MG tablet; Take 1 tablet (600 mg total) by mouth 3 (three) times daily.  Generalized anxiety disorder Stable, continue below. -     citalopram (CELEXA) 20 MG tablet; TAKE ONE (1) TABLET EACH DAY  BMI 34.0-34.9,adult -     Amb ref to Medical Nutrition Therapy-MNT  Follow up plan: Return in about 3 months (around 07/15/2018). Assunta Found, MD [place]

## 2018-04-20 ENCOUNTER — Ambulatory Visit: Payer: Medicare HMO

## 2018-05-12 ENCOUNTER — Encounter: Payer: Self-pay | Admitting: Family Medicine

## 2018-05-12 ENCOUNTER — Ambulatory Visit (INDEPENDENT_AMBULATORY_CARE_PROVIDER_SITE_OTHER): Payer: Medicare HMO | Admitting: Family Medicine

## 2018-05-12 ENCOUNTER — Ambulatory Visit: Payer: Medicare HMO

## 2018-05-12 VITALS — BP 101/56 | HR 76 | Temp 97.6°F | Ht 70.0 in | Wt 237.0 lb

## 2018-05-12 DIAGNOSIS — J40 Bronchitis, not specified as acute or chronic: Secondary | ICD-10-CM | POA: Diagnosis not present

## 2018-05-12 DIAGNOSIS — D518 Other vitamin B12 deficiency anemias: Secondary | ICD-10-CM

## 2018-05-12 DIAGNOSIS — J329 Chronic sinusitis, unspecified: Secondary | ICD-10-CM | POA: Diagnosis not present

## 2018-05-12 MED ORDER — PSEUDOEPHEDRINE-GUAIFENESIN ER 120-1200 MG PO TB12: 1.0000 | ORAL_TABLET | Freq: Two times a day (BID) | ORAL | 0 refills | Status: DC

## 2018-05-12 MED ORDER — LEVOFLOXACIN 500 MG PO TABS
500.0000 mg | ORAL_TABLET | Freq: Every day | ORAL | 0 refills | Status: DC
Start: 2018-05-12 — End: 2018-07-22

## 2018-05-12 MED ORDER — BETAMETHASONE SOD PHOS & ACET 6 (3-3) MG/ML IJ SUSP
6.0000 mg | Freq: Once | INTRAMUSCULAR | Status: AC
  Administered 2018-05-12: 6 mg via INTRAMUSCULAR

## 2018-05-12 NOTE — Progress Notes (Signed)
Chief Complaint  Patient presents with  . Cough  . Nasal Congestion    HPI  Patient presents today for Patient presents with upper respiratory congestion. Rhinorrhea that is frequently purulent. There is moderate sore throat. Patient reports coughing frequently as well.  yellow sputum noted. There is  Subjective fever feels hot all the time. The patient denies being short of breath. Onset was 3-5 days ago. Gradually worsening. Tried OTC claritin without improvement.  PMH: Smoking status noted ROS: Per HPI  Objective: BP (!) 101/56   Pulse 76   Temp 97.6 F (36.4 C) (Oral)   Ht 5\' 10"  (1.778 m)   Wt 237 lb (107.5 kg)   BMI 34.01 kg/m  Gen: NAD, alert, cooperative with exam HEENT: NCAT, Nasal passages swollen, red TMs clear. Maxillary and Frontal sinuses tender for percussion CV: RRR, good S1/S2, no murmur Resp: Bronchitis changes with scattered wheezes, non-labored Ext: No edema, warm Neuro: Alert and oriented, No gross deficits  Assessment and plan:  1. Sinobronchitis     Meds ordered this encounter  Medications  . Pseudoephedrine-Guaifenesin [phone] MG TB12    Sig: Take 1 tablet by mouth 2 (two) times daily. For congestion    Dispense:  20 each    Refill:  0  . levofloxacin (LEVAQUIN) 500 MG tablet    Sig: Take 1 tablet (500 mg total) by mouth daily. For 10 days    Dispense:  10 tablet    Refill:  0  . betamethasone acetate-betamethasone sodium phosphate (CELESTONE) injection 6 mg    No orders of the defined types were placed in this encounter.   Follow up as needed.  Claretta Fraise, MD

## 2018-05-27 ENCOUNTER — Encounter: Payer: Medicare HMO | Attending: Pediatrics | Admitting: Nutrition

## 2018-05-27 VITALS — Ht 70.0 in | Wt 241.0 lb

## 2018-05-27 DIAGNOSIS — E782 Mixed hyperlipidemia: Secondary | ICD-10-CM

## 2018-05-27 DIAGNOSIS — R739 Hyperglycemia, unspecified: Secondary | ICD-10-CM

## 2018-05-27 DIAGNOSIS — E669 Obesity, unspecified: Secondary | ICD-10-CM

## 2018-05-27 NOTE — Progress Notes (Signed)
Medical Nutrition Therapy:  Appt start time: 1950 end time:  1630.   Assessment:  Primary concerns today: Prediabetes, Hyperlipidemia and Obesity. Married. She does the cooking and shopping.  Cooks some. Doesn't cook if her husband and son aren't home to eat.  Eats 3 meals per day. PCP [facility]. A1C 6.3%.  Has MS 20 + years. She is a smoker. Has COPD. Had history of respiratoty distress x 3. Wants to quit smoking. Wants  to lose 10-30 lbs.   Admits to diet higher in processed foods. Low in fresh fruits and vegetables. Not exercising. Willing to make some changes to improve her health    CMP Latest Ref Rng & Units 03/03/2018 07/13/2017 05/05/2017  Glucose 65 - 99 mg/dL 134(H) 152(H) 120(H)  BUN 6 - 24 mg/dL 9 12 10   Creatinine 0.57 - 1.00 mg/dL 1.01(H) 1.14(H) 0.91  Sodium 134 - 144 mmol/L 141 144 144  Potassium 3.5 - 5.2 mmol/L 4.3 4.1 4.4  Chloride 96 - 106 mmol/L 98 103 102  CO2 20 - 29 mmol/L 27 28 27   Calcium 8.7 - 10.2 mg/dL 9.1 8.6(L) 8.9  Total Protein 6.0 - 8.5 g/dL 6.2 6.0 6.2  Total Bilirubin 0.0 - 1.2 mg/dL 0.4 0.2 0.3  Alkaline Phos 39 - 117 IU/L 82 78 73  AST 0 - 40 IU/L 11 15 11   ALT 0 - 32 IU/L 10 9 10    Lab Results  Component Value Date   HGBA1C 6.1 09/10/2015   Lipid Panel     Component Value Date/Time   CHOL 238 (H) 03/16/2017 0852   CHOL 219 (H) 01/10/2013 0900   TRIG 96 03/16/2017 0852   TRIG 121 01/10/2015 0955   TRIG 134 01/10/2013 0900   HDL 53 03/16/2017 0852   HDL 43 01/10/2015 0955   HDL 37 (L) 01/10/2013 0900   CHOLHDL 4.5 (H) 03/16/2017 0852   CHOLHDL 6.4 12/10/2010 0608   VLDL 45 (H) 12/10/2010 0608   LDLCALC 166 (H) 03/16/2017 0852   LDLCALC 148 (H) 03/20/2014 0826   LDLCALC 155 (H) 01/10/2013 0900      Preferred Learning Style:   No preference indicated   Learning Readiness:   Ready  Change in progress   MEDICATIONS:    DIETARY INTAKE:   Eats 2-3 meals per day.   Usual physical activity: ADL   Estimated energy  needs: 1200  calories 135 g carbohydrates 90 g protein 33 g fat  Progress Towards Goal(s):  In progress.   Nutritional Diagnosis:  NB-1.1 Food and nutrition-related knowledge deficit As related to Prediabetes and Obesity.  As evidenced by BMi and A1C.    Intervention: Nutrition and Pre-Diabetes education provided on My Plate, CHO counting, meal planning, portion sizes, timing of meals, avoiding snacks between meals unless having a low blood sugar, target ranges for A1C and blood sugars, signs/symptoms and treatment of hyper/hypoglycemia, monitoring blood sugars, taking medications as prescribed, benefits of exercising 30 minutes per day and prevention of DM. High Fiber Lower Fat diet.  Goals  1. Follow My Plate 2. Walk 30 minutes daily. 3. Increase vegetables of zucchini and salads 4. Keep drinking water 5. Cut out snacks at night. Lose 1-2 lbs per week   Teaching Method Utilized:  Visual Auditory Hands on  Handouts given during visit include:  The Plate Method   Meal Plan Card   Barriers to learning/adherence to lifestyle change: none  Demonstrated degree of understanding via:  Teach Back   Monitoring/Evaluation:  Dietary  intake, exercise, meal planning, and body weight in 1 month(s).

## 2018-05-27 NOTE — Patient Instructions (Addendum)
Goals 1. Follow My Plate 2. Walk 30 minutes daily. 3. Increase vegetables of zucchini and salads 4. Keep drinking water 5. Cut out snacks at night. Lose 1-2 lbs per week

## 2018-06-03 ENCOUNTER — Encounter: Payer: Self-pay | Admitting: Nutrition

## 2018-06-04 DIAGNOSIS — Z029 Encounter for administrative examinations, unspecified: Secondary | ICD-10-CM

## 2018-06-14 ENCOUNTER — Ambulatory Visit (INDEPENDENT_AMBULATORY_CARE_PROVIDER_SITE_OTHER): Payer: Medicare HMO | Admitting: *Deleted

## 2018-06-14 DIAGNOSIS — D518 Other vitamin B12 deficiency anemias: Secondary | ICD-10-CM

## 2018-06-14 NOTE — Progress Notes (Signed)
Pt given Cyanocobalamin inj Tolerated inj

## 2018-07-15 ENCOUNTER — Ambulatory Visit (INDEPENDENT_AMBULATORY_CARE_PROVIDER_SITE_OTHER): Payer: Medicare HMO | Admitting: *Deleted

## 2018-07-15 DIAGNOSIS — D518 Other vitamin B12 deficiency anemias: Secondary | ICD-10-CM | POA: Diagnosis not present

## 2018-07-15 NOTE — Progress Notes (Signed)
Pt given Cyanocobalamin inj Tolerated well 

## 2018-07-19 ENCOUNTER — Ambulatory Visit: Payer: Medicare HMO | Admitting: Nutrition

## 2018-07-22 ENCOUNTER — Ambulatory Visit (INDEPENDENT_AMBULATORY_CARE_PROVIDER_SITE_OTHER): Payer: Medicare HMO | Admitting: Pediatrics

## 2018-07-22 ENCOUNTER — Encounter: Payer: Self-pay | Admitting: Pediatrics

## 2018-07-22 VITALS — BP 112/55 | HR 75 | Temp 98.0°F | Ht 70.0 in | Wt 241.6 lb

## 2018-07-22 DIAGNOSIS — R7303 Prediabetes: Secondary | ICD-10-CM

## 2018-07-22 DIAGNOSIS — G629 Polyneuropathy, unspecified: Secondary | ICD-10-CM | POA: Diagnosis not present

## 2018-07-22 DIAGNOSIS — F411 Generalized anxiety disorder: Secondary | ICD-10-CM | POA: Diagnosis not present

## 2018-07-22 DIAGNOSIS — M7989 Other specified soft tissue disorders: Secondary | ICD-10-CM

## 2018-07-22 DIAGNOSIS — J449 Chronic obstructive pulmonary disease, unspecified: Secondary | ICD-10-CM

## 2018-07-22 MED ORDER — GABAPENTIN 600 MG PO TABS: 600.0000 mg | ORAL_TABLET | Freq: Three times a day (TID) | ORAL | 5 refills | Status: DC

## 2018-07-22 MED ORDER — BUDESONIDE-FORMOTEROL FUMARATE 160-4.5 MCG/ACT IN AERO: INHALATION_SPRAY | RESPIRATORY_TRACT | 11 refills | Status: DC

## 2018-07-22 MED ORDER — CITALOPRAM HYDROBROMIDE 20 MG PO TABS: ORAL_TABLET | ORAL | 1 refills | Status: DC

## 2018-07-22 MED ORDER — FUROSEMIDE 20 MG PO TABS: 20.0000 mg | ORAL_TABLET | Freq: Every day | ORAL | 6 refills | Status: DC

## 2018-07-22 NOTE — Progress Notes (Signed)
  Subjective:   Patient ID: [name], female    DOB: February 21, 1956, 62 y.o.   MRN: [id number] CC: Hyperlipidemia (3 month follow up ) and Medical Management of Chronic Issues (pre-diabetes, hyperlipidemia)  HPI: [name] is a 62 y.o. female   Prediabetes: Has been eating more carbohydrates recently.  Neuropathy: Taking gabapentin 2-3 times a day.  Has good relief in symptoms with it.  Sometimes last dose she can just take half.  Anxiety: Some ongoing stress.  She thinks the citalopram is helping  Leg swelling: Worse when she is up on her feet.  Taking Lasix as needed.  GERD: Taking pantoprazole regularly.  Tobacco use: Trying to cut back.  Not interested in further assistance right now.  COPD: Taking Symbicort regularly, has not needed albuterol since last visit.  Relevant past medical, surgical, family and social history reviewed. Allergies and medications reviewed and updated. Social History   Tobacco Use  Smoking Status Current Every Day Smoker  . Packs/day: 0.50  . Years: 40.00  . Pack years: 20.00  . Types: Cigarettes  Smokeless Tobacco Never Used  Tobacco Comment   3/4 pack a day sicne age 89   ROS: Per HPI   Objective:    BP (!) 112/55   Pulse 75   Temp 98 F (36.7 C) (Oral)   Ht '5\' 10"'$  (1.778 m)   Wt 241 lb 9.6 oz (109.6 kg)   SpO2 98%   BMI 34.67 kg/m   Wt Readings from Last 3 Encounters:  07/22/18 241 lb 9.6 oz (109.6 kg)  05/27/18 241 lb (109.3 kg)  05/12/18 237 lb (107.5 kg)    Gen: NAD, alert, cooperative with exam, NCAT EYES: EOMI, no conjunctival injection, or no icterus ENT:  TMs pearly gray b/l, OP without erythema LYMPH: no cervical LAD CV: NRRR, normal S1/S2, no murmur, distal pulses 2+ b/l Resp: CTABL, no wheezes, normal WOB Ext: No edema, warm Neuro: Alert and oriented, strength equal b/l UE and LE, coordination grossly normal MSK: normal muscle bulk Skin: Approximately 1 cm nodule through skin left lower back, easily mobile.   No redness or tenderness.  Similar 1 cm skin nodule left upper arm.  Assessment & Plan:  [name] was seen today for hyperlipidemia and medical management of chronic issues.  Diagnoses and all orders for this visit:  Prediabetes Cont avoiding sugary foods, increase physical activity check average blood sugar today -     BMP8+EGFR -     Bayer DCA Hb A1c Waived  COPD mixed type (HCC) Stable, cont below. Tobacco cessation encouraged -     budesonide-formoterol (SYMBICORT) 160-4.5 MCG/ACT inhaler; Take 2 puffs first thing in am and then another 2 puffs about 12 hours later.  Generalized anxiety disorder Stable, cont below -     citalopram (CELEXA) 20 MG tablet; TAKE ONE (1) TABLET EACH DAY  Swelling of limb stable -     furosemide (LASIX) 20 MG tablet; Take 1-2 tablets (20-40 mg total) by mouth daily. As needed -     BMP8+EGFR  Neuropathy Stable, cont below -     gabapentin (NEURONTIN) 600 MG tablet; Take 1 tablet (600 mg total) by mouth 3 (three) times daily.  Declines mammogram, declines flu shot Follow up plan: Return in about 3 months (around 10/21/2018). Assunta Found, MD [place]

## 2018-07-23 LAB — BMP8+EGFR
BUN / CREAT RATIO: 13 (ref 12–28)
BUN: 11 mg/dL (ref 8–27)
CO2: 26 mmol/L (ref 20–29)
Calcium: 9.2 mg/dL (ref 8.7–10.3)
Chloride: 95 mmol/L — ABNORMAL LOW (ref 96–106)
Creatinine, Ser: 0.83 mg/dL (ref 0.57–1.00)
GFR calc Af Amer: 89 mL/min/{1.73_m2} (ref >59)
GFR calc non Af Amer: 77 mL/min/{1.73_m2} (ref >59)
Glucose: 127 mg/dL — ABNORMAL HIGH (ref 65–99)
Potassium: 4.2 mmol/L (ref 3.5–5.2)
Sodium: 145 mmol/L — ABNORMAL HIGH (ref 134–144)

## 2018-07-23 LAB — BAYER DCA HB A1C WAIVED: HB A1C (BAYER DCA - WAIVED): 6.6 % (ref <7.0)

## 2018-08-15 IMAGING — DX DG ABDOMEN 1V
2 series · 2 of 2 positions shown · non-contrast
Comparison: Abdominal and pelvic CT scan June 08, 2015

CLINICAL DATA: Two days of abdominal pain. History of previous
cholecystectomy, multiple scleroses, COPD, current smoker.

EXAM:
ABDOMEN - 1 VIEW

[abdomen kub (1 of 2)]
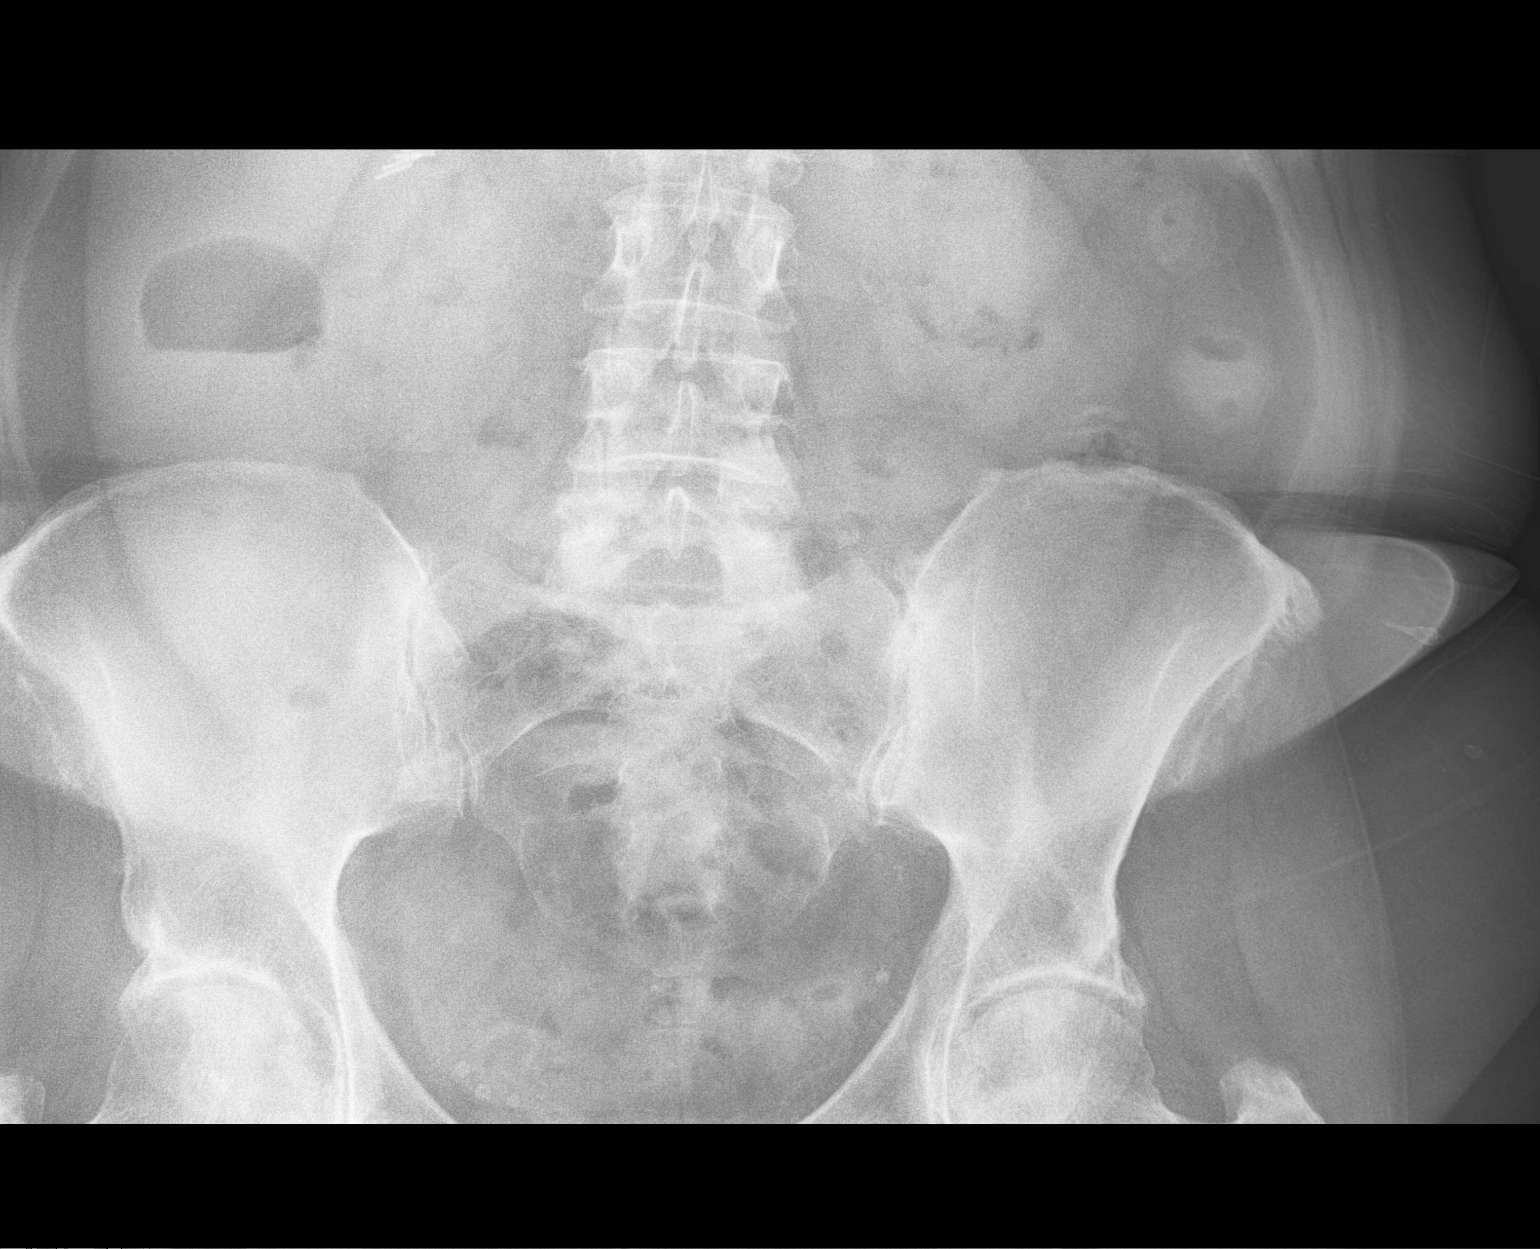

[abdomen kub (2 of 2)]
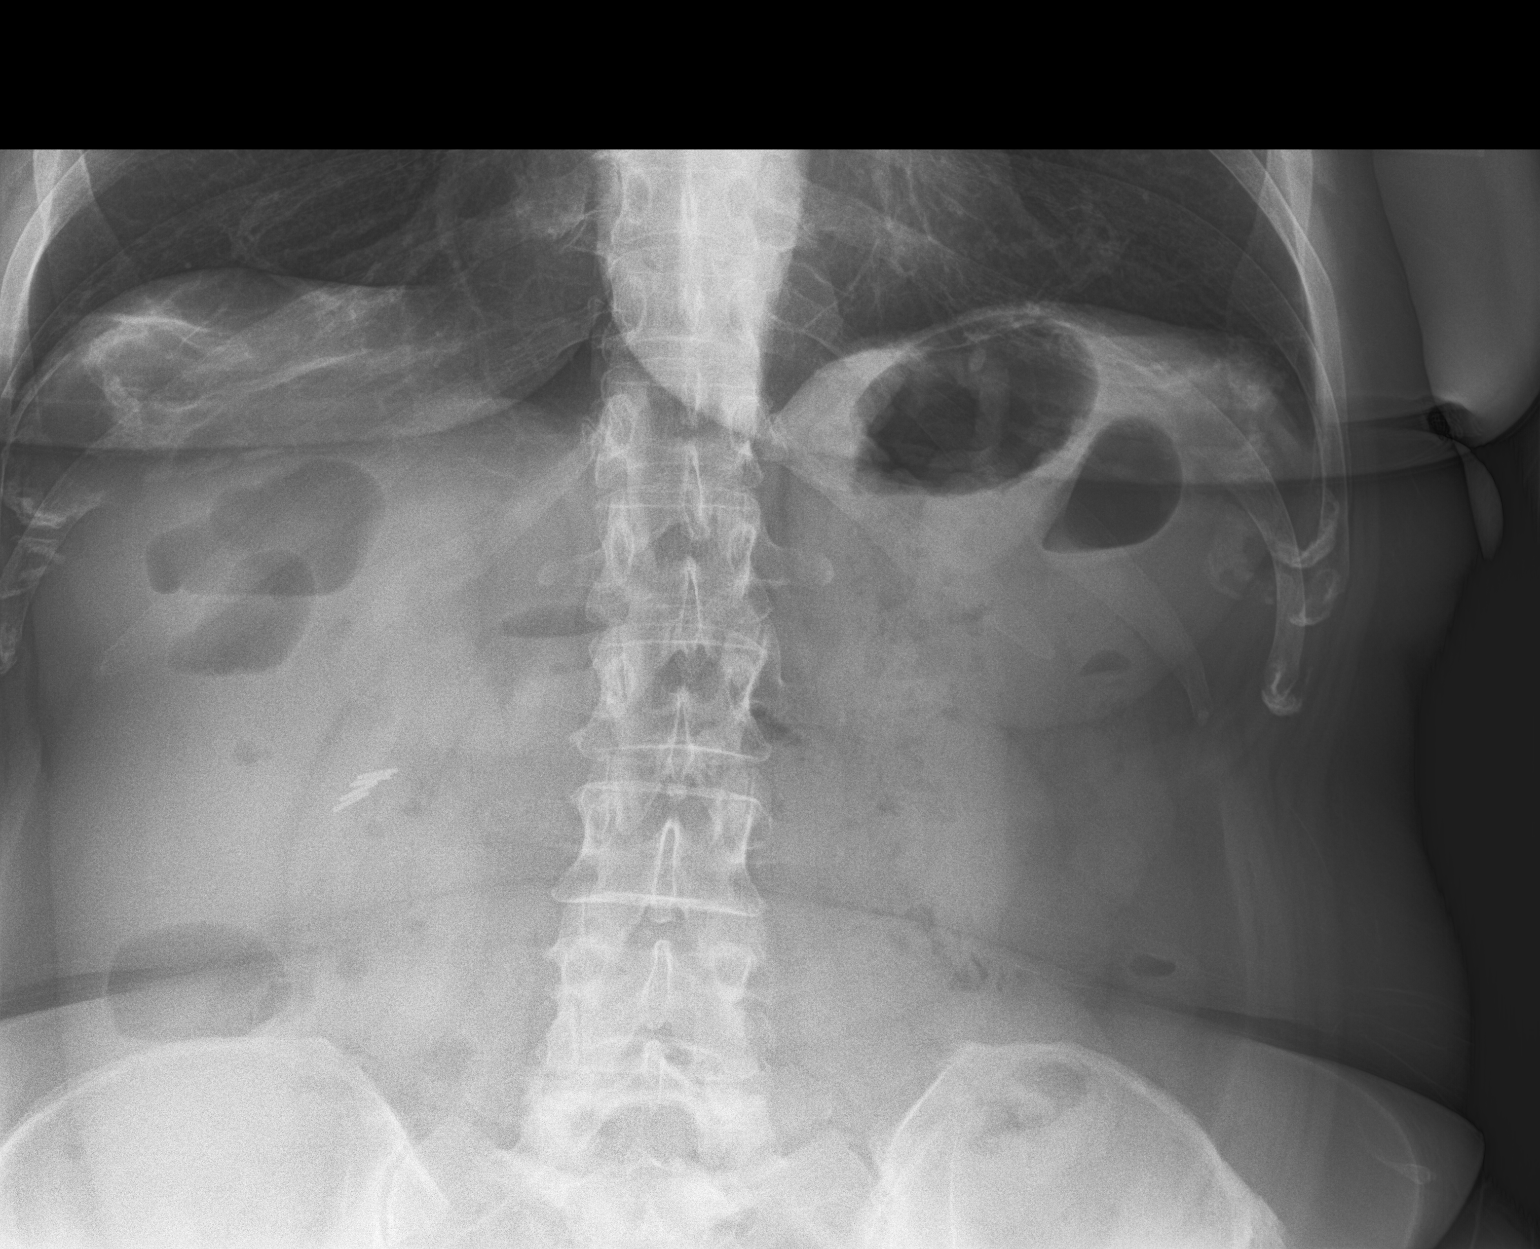

[2 of 2 positions shown; findings below may reference images not displayed]

FINDINGS: There are small air in fluid levels noted in the right aspect of the
abdomen likely within the ascending colon and hepatic flexure. No
small bowel air-fluid levels are observed. There is no free
extraluminal gas. The stool burden is not excessive. There surgical
clips in the gallbladder fossa. No abnormal soft tissue
calcifications are observed. The bony structures exhibit no acute
abnormalities. There are degenerative changes of both hips and of
the lower lumbar spine. There is stable increased density at the
right lung base.
IMPRESSION: Relatively nonspecific bowel gas pattern which may reflect a mild
colonic ileus or gastroenteritis type process. No evidence of
obstruction or perforation.

## 2018-08-16 ENCOUNTER — Ambulatory Visit (INDEPENDENT_AMBULATORY_CARE_PROVIDER_SITE_OTHER): Payer: Medicare HMO | Admitting: *Deleted

## 2018-08-16 DIAGNOSIS — D518 Other vitamin B12 deficiency anemias: Secondary | ICD-10-CM

## 2018-08-16 NOTE — Progress Notes (Signed)
Pt given Cyanocobalamin inj Tolerated well 

## 2018-09-06 DIAGNOSIS — Z0100 Encounter for examination of eyes and vision without abnormal findings: Secondary | ICD-10-CM | POA: Diagnosis not present

## 2018-09-06 DIAGNOSIS — H5210 Myopia, unspecified eye: Secondary | ICD-10-CM | POA: Diagnosis not present

## 2018-09-06 DIAGNOSIS — E109 Type 1 diabetes mellitus without complications: Secondary | ICD-10-CM | POA: Diagnosis not present

## 2018-09-17 ENCOUNTER — Ambulatory Visit (INDEPENDENT_AMBULATORY_CARE_PROVIDER_SITE_OTHER): Payer: Medicare HMO | Admitting: *Deleted

## 2018-09-17 ENCOUNTER — Ambulatory Visit: Payer: Medicare HMO

## 2018-09-17 DIAGNOSIS — D518 Other vitamin B12 deficiency anemias: Secondary | ICD-10-CM

## 2018-09-17 NOTE — Progress Notes (Signed)
B12 injection given and tolerated well.  

## 2018-10-18 ENCOUNTER — Ambulatory Visit (INDEPENDENT_AMBULATORY_CARE_PROVIDER_SITE_OTHER): Payer: Medicare HMO | Admitting: *Deleted

## 2018-10-18 DIAGNOSIS — D518 Other vitamin B12 deficiency anemias: Secondary | ICD-10-CM

## 2018-10-18 NOTE — Progress Notes (Signed)
Pt given Cyanocobalamin inj Tolerated well 

## 2018-10-21 ENCOUNTER — Ambulatory Visit: Payer: Medicare HMO | Admitting: Family Medicine

## 2018-10-22 ENCOUNTER — Other Ambulatory Visit: Payer: Self-pay

## 2018-10-22 ENCOUNTER — Encounter: Payer: Self-pay | Admitting: Family Medicine

## 2018-10-22 ENCOUNTER — Ambulatory Visit (INDEPENDENT_AMBULATORY_CARE_PROVIDER_SITE_OTHER): Payer: Medicare HMO | Admitting: Family Medicine

## 2018-10-22 VITALS — BP 107/69 | HR 76 | Temp 97.5°F | Ht 70.0 in | Wt 241.6 lb

## 2018-10-22 DIAGNOSIS — D518 Other vitamin B12 deficiency anemias: Secondary | ICD-10-CM

## 2018-10-22 DIAGNOSIS — M7989 Other specified soft tissue disorders: Secondary | ICD-10-CM

## 2018-10-22 DIAGNOSIS — J449 Chronic obstructive pulmonary disease, unspecified: Secondary | ICD-10-CM

## 2018-10-22 DIAGNOSIS — F411 Generalized anxiety disorder: Secondary | ICD-10-CM

## 2018-10-22 DIAGNOSIS — K219 Gastro-esophageal reflux disease without esophagitis: Secondary | ICD-10-CM

## 2018-10-22 DIAGNOSIS — E782 Mixed hyperlipidemia: Secondary | ICD-10-CM | POA: Diagnosis not present

## 2018-10-22 DIAGNOSIS — G629 Polyneuropathy, unspecified: Secondary | ICD-10-CM | POA: Diagnosis not present

## 2018-10-22 DIAGNOSIS — R7303 Prediabetes: Secondary | ICD-10-CM | POA: Diagnosis not present

## 2018-10-22 LAB — BAYER DCA HB A1C WAIVED: HB A1C (BAYER DCA - WAIVED): 6.9 % (ref <7.0)

## 2018-10-22 MED ORDER — VARENICLINE TARTRATE 0.5 MG X 11 & 1 MG X 42 PO MISC: ORAL | 0 refills | Status: DC

## 2018-10-22 MED ORDER — GABAPENTIN 600 MG PO TABS: 600.0000 mg | ORAL_TABLET | Freq: Three times a day (TID) | ORAL | 5 refills | Status: DC

## 2018-10-22 MED ORDER — TIOTROPIUM BROMIDE MONOHYDRATE 18 MCG IN CAPS: 18.0000 ug | ORAL_CAPSULE | Freq: Every day | RESPIRATORY_TRACT | 12 refills | Status: DC

## 2018-10-22 MED ORDER — PANTOPRAZOLE SODIUM 40 MG PO TBEC: DELAYED_RELEASE_TABLET | ORAL | 3 refills | Status: DC

## 2018-10-22 MED ORDER — VARENICLINE TARTRATE 1 MG PO TABS: 1.0000 mg | ORAL_TABLET | Freq: Two times a day (BID) | ORAL | 1 refills | Status: DC

## 2018-10-22 MED ORDER — CITALOPRAM HYDROBROMIDE 20 MG PO TABS: ORAL_TABLET | ORAL | 3 refills | Status: DC

## 2018-10-22 MED ORDER — FUROSEMIDE 20 MG PO TABS: 20.0000 mg | ORAL_TABLET | Freq: Every day | ORAL | 6 refills | Status: DC

## 2018-10-22 NOTE — Progress Notes (Signed)
BP 107/69   Pulse 76   Temp (!) 97.5 F (36.4 C)   Ht 5' 10"  (1.778 m)   Wt 241 lb 9.6 oz (109.6 kg)   BMI 34.67 kg/m    Subjective:    Patient ID: [name], female    DOB: 15-Oct-1955, 63 y.o.   MRN: [id number]  HPI: [name] is a 63 y.o. female presenting on 10/22/2018 for Establish Care Evette Doffing pt) and Hyperlipidemia (3 month follow up)   HPI GERD Patient is currently on Protonix.  She denies any major symptoms or abdominal pain or belching or burping. She denies any blood in her stool or lightheadedness or dizziness.   COPD Patient is coming in for COPD recheck today.  He is currently on Symbicort and albuterol.  She has been complaining of increasing coughing and shortness of breath and wheezing, not as much wheezing that frequently but she has been using her albuterol inhaler more frequently as well because she feels short of breath especially when she is ambulating.  She has been increasing her albuterol inhaler to 3-4 times a week.  She denies any recent exacerbations but she says just all the time she is feeling like this.  Type 2 diabetes mellitus Patient comes in today for recheck of his diabetes. Patient has been currently taking no medications currently for diabetes and her last A1c was 6.7, she has been doing diet control and trying to change to help fix things and improve her sugars and she feels like she is doing somewhat better but the holiday season just did happen over the past few months.. Patient is currently on an ACE inhibitor/ARB. Patient has not seen an ophthalmologist this year. Patient denies any issues with their feet.   Idiopathic neuropathy Patient has neuropathy and takes gabapentin for it.  She says she has had a lot of testing to try and figure out where the neuropathy comes from including nerve testing and they never did really find a source of it, she has been taking the gabapentin for quite a few years and she says she is recently reduced  it to where she is taking half a pill in the middle of the day and a pill in the morning and a pill in the evenings because it makes her groggy but she denies any other side effects and she says it is mostly controlling her neuropathy, her neuropathy is mostly in her legs and her feet.  Patient is coming in for vitamin B12 recheck as well.  Anxiety recheck Patient is coming in for anxiety recheck and feels like her mood is doing well but she feels like she gets a little more palpitations and flutters but she thinks that is more due to her breathing than her mood.  She denies any major depression or major feelings of anxiety.  She denies any suicidal ideations or thoughts of hurting herself. Depression screen [facility] 2/9 10/22/2018 07/22/2018 06/03/2018 05/12/2018 04/15/2018  Decreased Interest 1 1 0 2 0  Down, Depressed, Hopeless 1 1 0 2 0  PHQ - 2 Score 2 2 0 4 0  Altered sleeping 3 0 - 2 0  Tired, decreased energy 3 1 - 3 0  Change in appetite 3 2 - 2 0  Feeling bad or failure about yourself  0 0 - 1 0  Trouble concentrating 0 0 - 1 0  Moving slowly or fidgety/restless 0 0 - 0 0  Suicidal thoughts 0 0 - 0 0  PHQ-9 Score 11 5 - 13 0  Difficult doing work/chores - - - - -  Some recent data might be hidden    Relevant past medical, surgical, family and social history reviewed and updated as indicated. Interim medical history since our last visit reviewed. Allergies and medications reviewed and updated.  Review of Systems  Constitutional: Negative for chills and fever.  HENT: Negative for congestion, ear discharge and ear pain.   Eyes: Negative for redness and visual disturbance.  Respiratory: Positive for cough, shortness of breath and wheezing. Negative for chest tightness.   Cardiovascular: Negative for chest pain and leg swelling.  Genitourinary: Negative for difficulty urinating and dysuria.  Musculoskeletal: Negative for back pain and gait problem.  Skin: Negative for rash.   Neurological: Positive for numbness. Negative for dizziness, weakness, light-headedness and headaches.  Psychiatric/Behavioral: Negative for agitation and behavioral problems.  All other systems reviewed and are negative.   Per HPI unless specifically indicated above   Allergies as of 10/22/2018      Reactions   Fenofibrate Other (See Comments)   Flu-like    Penicillin G Other (See Comments)   Blisters in mouth   Pravastatin Other (See Comments)   "flu like symptoms"       Medication List       Accurate as of October 22, 2018  4:35 PM. Always use your most recent med list.        albuterol (2.5 MG/3ML) 0.083% nebulizer solution Commonly known as:  PROVENTIL Take 3 mLs (2.5 mg total) by nebulization every 6 (six) hours as needed for wheezing or shortness of breath.   albuterol 108 (90 Base) MCG/ACT inhaler Commonly known as:  Ventolin HFA Inhale 2 puffs into the lungs every 6 (six) hours as needed. For shortness of breath   budesonide-formoterol 160-4.5 MCG/ACT inhaler Commonly known as:  Symbicort Take 2 puffs first thing in am and then another 2 puffs about 12 hours later.   cholecalciferol 1000 units tablet Commonly known as:  VITAMIN D Take 1,000 Units by mouth daily.   citalopram 20 MG tablet Commonly known as:  CELEXA TAKE ONE (1) TABLET EACH DAY   cycloSPORINE 0.05 % ophthalmic emulsion Commonly known as:  RESTASIS 1 drop 2 (two) times daily.   folic acid 921 MCG tablet Commonly known as:  FOLVITE Take 0.5 tablets (400 mcg total) by mouth daily.   furosemide 20 MG tablet Commonly known as:  LASIX Take 1-2 tablets (20-40 mg total) by mouth daily. As needed   gabapentin 600 MG tablet Commonly known as:  NEURONTIN Take 1 tablet (600 mg total) by mouth 3 (three) times daily.   loratadine 10 MG tablet Commonly known as:  CLARITIN Take 10 mg daily by mouth.   Melatonin 10 MG Tabs Take 1 tablet by mouth at bedtime.   pantoprazole 40 MG tablet Commonly  known as:  PROTONIX TAKE ONE (1) TABLET EACH DAY   tiotropium 18 MCG inhalation capsule Commonly known as:  Spiriva HandiHaler Place 1 capsule (18 mcg total) into inhaler and inhale daily.   varenicline 0.5 MG X 11 & 1 MG X 42 tablet Commonly known as:  Chantix Starting Month Pak Take one 0.5 mg tablet by mouth daily for 3 days, then one 0.5 mg tablet twice daily for 4 days, then one 1 mg tablet twice daily.   varenicline 1 MG tablet Commonly known as:  Chantix Continuing Month Pak Take 1 tablet (1 mg total) by mouth 2 (two)  times daily.          Objective:    BP 107/69   Pulse 76   Temp (!) 97.5 F (36.4 C)   Ht 5' 10"  (1.778 m)   Wt 241 lb 9.6 oz (109.6 kg)   BMI 34.67 kg/m   Wt Readings from Last 3 Encounters:  10/22/18 241 lb 9.6 oz (109.6 kg)  07/22/18 241 lb 9.6 oz (109.6 kg)  05/27/18 241 lb (109.3 kg)    Physical Exam Vitals signs and nursing note reviewed.  Constitutional:      General: She is not in acute distress.    Appearance: She is well-developed. She is not diaphoretic.  Eyes:     Conjunctiva/sclera: Conjunctivae normal.  Cardiovascular:     Rate and Rhythm: Normal rate and regular rhythm.     Heart sounds: Normal heart sounds. No murmur.  Pulmonary:     Effort: Pulmonary effort is normal. No respiratory distress.     Breath sounds: Normal breath sounds. No wheezing.  Musculoskeletal: Normal range of motion.        General: Swelling (1+ pitting edema) present. No tenderness.  Skin:    General: Skin is warm and dry.     Findings: No rash.  Neurological:     Mental Status: She is alert and oriented to person, place, and time.     Coordination: Coordination normal.  Psychiatric:        Behavior: Behavior normal.       Assessment & Plan:   Problem List Items Addressed This Visit      Respiratory   COPD  GOLD II/ AB   Relevant Medications   tiotropium (SPIRIVA HANDIHALER) 18 MCG inhalation capsule   varenicline (CHANTIX STARTING MONTH  PAK) 0.5 MG X 11 & 1 MG X 42 tablet   varenicline (CHANTIX CONTINUING MONTH PAK) 1 MG tablet     Digestive   Gastroesophageal reflux disease without esophagitis   Relevant Medications   pantoprazole (PROTONIX) 40 MG tablet   Other Relevant Orders   CBC with Differential/Platelet     Other   Mixed hyperlipidemia - Primary   Relevant Medications   furosemide (LASIX) 20 MG tablet   Other Relevant Orders   Lipid panel   B12 deficiency anemia   Relevant Orders   Vitamin B12   Generalized anxiety disorder   Relevant Medications   citalopram (CELEXA) 20 MG tablet   Pre-diabetes   Relevant Orders   CMP14+EGFR   Bayer DCA Hb A1c Waived    Other Visit Diagnoses    Neuropathy       Relevant Medications   gabapentin (NEURONTIN) 600 MG tablet   Swelling of limb       Relevant Medications   furosemide (LASIX) 20 MG tablet    COPD is doing worse so we will add Spiriva to her combination of some Symbicort.  Continue the gabapentin and her other medication for anxiety, will do Chantix for smoking cessation.  Follow up plan: Return in about 3 months (around 01/22/2019), or if symptoms worsen or fail to improve, for Anxiety and prediabetes recheck.  Counseling provided for all of the vaccine components Orders Placed This Encounter  Procedures  . CBC with Differential/Platelet  . CMP14+EGFR  . Bayer DCA Hb A1c Waived  . Lipid panel  . Vitamin B12    Caryl Pina, MD [facility] 10/22/2018, 4:35 PM

## 2018-10-23 LAB — CMP14+EGFR
ALT: 13 IU/L (ref 0–32)
AST: 9 IU/L (ref 0–40)
Albumin/Globulin Ratio: 1.7 (ref 1.2–2.2)
Albumin: 3.9 g/dL (ref 3.8–4.9)
Alkaline Phosphatase: 85 IU/L (ref 39–117)
BUN/Creatinine Ratio: 16 (ref 12–28)
BUN: 13 mg/dL (ref 8–27)
Bilirubin Total: 0.4 mg/dL (ref 0.0–1.2)
CO2: 25 mmol/L (ref 20–29)
Calcium: 9.1 mg/dL (ref 8.7–10.3)
Chloride: 102 mmol/L (ref 96–106)
Creatinine, Ser: 0.82 mg/dL (ref 0.57–1.00)
GFR calc Af Amer: 90 mL/min/{1.73_m2} (ref >59)
GFR calc non Af Amer: 78 mL/min/{1.73_m2} (ref >59)
GLUCOSE: 138 mg/dL — AB (ref 65–99)
Globulin, Total: 2.3 g/dL (ref 1.5–4.5)
Potassium: 4.4 mmol/L (ref 3.5–5.2)
Sodium: 141 mmol/L (ref 134–144)
Total Protein: 6.2 g/dL (ref 6.0–8.5)

## 2018-10-23 LAB — LIPID PANEL
Chol/HDL Ratio: 5.1 ratio — ABNORMAL HIGH (ref 0.0–4.4)
Cholesterol, Total: 213 mg/dL — ABNORMAL HIGH (ref 100–199)
HDL: 42 mg/dL (ref >39)
LDL Calculated: 130 mg/dL — ABNORMAL HIGH (ref 0–99)
Triglycerides: 205 mg/dL — ABNORMAL HIGH (ref 0–149)
VLDL Cholesterol Cal: 41 mg/dL — ABNORMAL HIGH (ref 5–40)

## 2018-10-23 LAB — CBC WITH DIFFERENTIAL/PLATELET
Basophils Absolute: 0.1 10*3/uL (ref 0.0–0.2)
Basos: 1 %
EOS (ABSOLUTE): 0.2 10*3/uL (ref 0.0–0.4)
Eos: 2 %
Hematocrit: 40.8 % (ref 34.0–46.6)
Hemoglobin: 13.4 g/dL (ref 11.1–15.9)
IMMATURE GRANS (ABS): 0 10*3/uL (ref 0.0–0.1)
Immature Granulocytes: 0 %
Lymphocytes Absolute: 3 10*3/uL (ref 0.7–3.1)
Lymphs: 33 %
MCH: 28.8 pg (ref 26.6–33.0)
MCHC: 32.8 g/dL (ref 31.5–35.7)
MCV: 88 fL (ref 79–97)
Monocytes Absolute: 0.5 10*3/uL (ref 0.1–0.9)
Monocytes: 5 %
NEUTROS ABS: 5.3 10*3/uL (ref 1.4–7.0)
Neutrophils: 59 %
Platelets: 268 10*3/uL (ref 150–450)
RBC: 4.65 x10E6/uL (ref 3.77–5.28)
RDW: 12.4 % (ref 11.7–15.4)
WBC: 8.9 10*3/uL (ref 3.4–10.8)

## 2018-10-23 LAB — VITAMIN B12: Vitamin B-12: 918 pg/mL (ref 232–1245)

## 2018-10-26 ENCOUNTER — Encounter: Payer: Self-pay | Admitting: Family

## 2018-10-26 ENCOUNTER — Other Ambulatory Visit: Payer: Self-pay

## 2018-10-26 ENCOUNTER — Ambulatory Visit (INDEPENDENT_AMBULATORY_CARE_PROVIDER_SITE_OTHER): Payer: Medicare HMO | Admitting: Family

## 2018-10-26 ENCOUNTER — Ambulatory Visit (INDEPENDENT_AMBULATORY_CARE_PROVIDER_SITE_OTHER): Payer: Medicare HMO

## 2018-10-26 VITALS — BP 113/63 | HR 66 | Temp 97.5°F | Ht 70.0 in | Wt 240.6 lb

## 2018-10-26 DIAGNOSIS — J441 Chronic obstructive pulmonary disease with (acute) exacerbation: Secondary | ICD-10-CM | POA: Diagnosis not present

## 2018-10-26 DIAGNOSIS — R05 Cough: Secondary | ICD-10-CM

## 2018-10-26 DIAGNOSIS — R062 Wheezing: Secondary | ICD-10-CM

## 2018-10-26 DIAGNOSIS — J449 Chronic obstructive pulmonary disease, unspecified: Secondary | ICD-10-CM

## 2018-10-26 DIAGNOSIS — R059 Cough, unspecified: Secondary | ICD-10-CM

## 2018-10-26 DIAGNOSIS — R0602 Shortness of breath: Secondary | ICD-10-CM | POA: Diagnosis not present

## 2018-10-26 LAB — VERITOR FLU A/B WAIVED
Influenza A: NEGATIVE
Influenza B: NEGATIVE

## 2018-10-26 MED ORDER — PREDNISONE 20 MG PO TABS: ORAL_TABLET | ORAL | 0 refills | Status: DC

## 2018-10-26 MED ORDER — LEVALBUTEROL HCL 1.25 MG/0.5ML IN NEBU
1.2500 mg | INHALATION_SOLUTION | Freq: Once | RESPIRATORY_TRACT | Status: AC
  Administered 2018-10-26: 1.25 mg via RESPIRATORY_TRACT

## 2018-10-26 MED ORDER — DOXYCYCLINE HYCLATE 100 MG PO TABS: 100.0000 mg | ORAL_TABLET | Freq: Two times a day (BID) | ORAL | 0 refills | Status: DC

## 2018-10-26 MED ORDER — METHYLPREDNISOLONE ACETATE 80 MG/ML IJ SUSP
80.0000 mg | Freq: Once | INTRAMUSCULAR | Status: AC
  Administered 2018-10-26: 80 mg via INTRAMUSCULAR

## 2018-10-26 NOTE — Patient Instructions (Signed)
Chronic Obstructive Pulmonary Disease Chronic obstructive pulmonary disease (COPD) is a long-term (chronic) lung problem. When you have COPD, it is hard for air to get in and out of your lungs. Usually the condition gets worse over time, and your lungs will never return to normal. There are things you can do to keep yourself as healthy as possible.  Your doctor may treat your condition with: ? Medicines. ? Oxygen. ? Lung surgery.  Your doctor may also recommend: ? Rehabilitation. This includes steps to make your body work better. It may involve a team of specialists. ? Quitting smoking, if you smoke. ? Exercise and changes to your diet. ? Comfort measures (palliative care). Follow these instructions at home: Medicines  Take over-the-counter and prescription medicines only as told by your doctor.  Talk to your doctor before taking any cough or allergy medicines. You may need to avoid medicines that cause your lungs to be dry. Lifestyle  If you smoke, stop. Smoking makes the problem worse. If you need help quitting, ask your doctor.  Avoid being around things that make your breathing worse. This may include smoke, chemicals, and fumes.  Stay active, but remember to rest as well.  Learn and use tips on how to relax.  Make sure you get enough sleep. Most adults need at least 7 hours of sleep every night.  Eat healthy foods. Eat smaller meals more often. Rest before meals. Controlled breathing Learn and use tips on how to control your breathing as told by your doctor. Try:  Breathing in (inhaling) through your nose for 1 second. Then, pucker your lips and breath out (exhale) through your lips for 2 seconds.  Putting one hand on your belly (abdomen). Breathe in slowly through your nose for 1 second. Your hand on your belly should move out. Pucker your lips and breathe out slowly through your lips. Your hand on your belly should move in as you breathe out.  Controlled coughing Learn  and use controlled coughing to clear mucus from your lungs. Follow these steps: 1. Lean your head a little forward. 2. Breathe in deeply. 3. Try to hold your breath for 3 seconds. 4. Keep your mouth slightly open while coughing 2 times. 5. Spit any mucus out into a tissue. 6. Rest and do the steps again 1 or 2 times as needed. General instructions  Make sure you get all the shots (vaccines) that your doctor recommends. Ask your doctor about a flu shot and a pneumonia shot.  Use oxygen therapy and pulmonary rehabilitation if told by your doctor. If you need home oxygen therapy, ask your doctor if you should buy a tool to measure your oxygen level (oximeter).  Make a COPD action plan with your doctor. This helps you to know what to do if you feel worse than usual.  Manage any other conditions you have as told by your doctor.  Avoid going outside when it is very hot, cold, or humid.  Avoid people who have a sickness you can catch (contagious).  Keep all follow-up visits as told by your doctor. This is important. Contact a doctor if:  You cough up more mucus than usual.  There is a change in the color or thickness of the mucus.  It is harder to breathe than usual.  Your breathing is faster than usual.  You have trouble sleeping.  You need to use your medicines more often than usual.  You have trouble doing your normal activities such as getting dressed   or walking around the house. Get help right away if:  You have shortness of breath while resting.  You have shortness of breath that stops you from: ? Being able to talk. ? Doing normal activities.  Your chest hurts for longer than 5 minutes.  Your skin color is more blue than usual.  Your pulse oximeter shows that you have low oxygen for longer than 5 minutes.  You have a fever.  You feel too tired to breathe normally. Summary  Chronic obstructive pulmonary disease (COPD) is a long-term lung problem.  The way your  lungs work will never return to normal. Usually the condition gets worse over time. There are things you can do to keep yourself as healthy as possible.  Take over-the-counter and prescription medicines only as told by your doctor.  If you smoke, stop. Smoking makes the problem worse. This information is not intended to replace advice given to you by your health care provider. Make sure you discuss any questions you have with your health care provider. Document Released: 01/14/2008 Document Revised: 09/01/2016 Document Reviewed: 09/01/2016 Elsevier Interactive Patient Education  2019 [employer]vier Inc[employer]  

## 2018-10-26 NOTE — Progress Notes (Signed)
Subjective:    Patient ID: [name], female    DOB: [birth year], 63 y.o.   MRN: [id number]  Chief Complaint  Patient presents with  . cough and congestion began last night    Cough  This is a new problem. The current episode started today. The problem has been gradually worsening. The problem occurs every few minutes. The cough is productive of sputum and productive of brown sputum. Associated symptoms include chills, headaches, postnasal drip, shortness of breath and wheezing. Pertinent negatives include no ear congestion, ear pain, fever, myalgias, nasal congestion or sore throat. Risk factors for lung disease include smoking/tobacco exposure. She has tried rest and OTC cough suppressant for the symptoms. The treatment provided mild relief. Her past medical history is significant for COPD.      Review of Systems  Constitutional: Positive for chills. Negative for fever.  HENT: Positive for postnasal drip. Negative for ear pain and sore throat.   Respiratory: Positive for cough, shortness of breath and wheezing.   Musculoskeletal: Negative for myalgias.  Neurological: Positive for headaches.  All other systems reviewed and are negative.      Objective:   Physical Exam Vitals signs reviewed.  Constitutional:      General: She is not in acute distress.    Appearance: She is well-developed. She is ill-appearing.  HENT:     Head: Normocephalic and atraumatic.     Right Ear: Tympanic membrane normal.     Left Ear: Tympanic membrane normal.  Eyes:     Pupils: Pupils are equal, round, and reactive to light.  Neck:     Musculoskeletal: Normal range of motion and neck supple.     Thyroid: No thyromegaly.  Cardiovascular:     Rate and Rhythm: Normal rate and regular rhythm.     Heart sounds: Normal heart sounds. No murmur.  Pulmonary:     Effort: No respiratory distress.     Breath sounds: Wheezing and rhonchi present.  Abdominal:     General: Bowel sounds are normal.  There is no distension.     Palpations: Abdomen is soft.     Tenderness: There is no abdominal tenderness.  Musculoskeletal: Normal range of motion.        General: No tenderness.  Skin:    General: Skin is warm and dry.  Neurological:     Mental Status: She is alert and oriented to person, place, and time.     Cranial Nerves: No cranial nerve deficit.     Deep Tendon Reflexes: Reflexes are normal and symmetric.  Psychiatric:        Behavior: Behavior normal.        Thought Content: Thought content normal.        Judgment: Judgment normal.       BP 113/63   Pulse 66   Temp (!) 97.5 F (36.4 C) (Oral)   Ht 5\' 10"  (1.778 m)   Wt 240 lb 9.6 oz (109.1 kg)   SpO2 97%   BMI 34.52 kg/m      Assessment & Plan:  [name] comes in today with chief complaint of cough and congestion began last night   Diagnosis and orders addressed:  1. Chronic obstructive pulmonary disease with acute exacerbation ([place]) - Veritor Flu A/B Waived - DG Chest 2 View; Future - methylPREDNISolone acetate (DEPO-MEDROL) injection 80 mg - levalbuterol (XOPENEX) nebulizer solution 1.25 mg - predniSONE (DELTASONE) 20 MG tablet; Take 3 tabs daily for 1 week, then  2 tabs for 1 week, then 1 tab for one week  Dispense: 42 tablet; Refill: 0 - doxycycline (VIBRA-TABS) 100 MG tablet; Take 1 tablet (100 mg total) by mouth 2 (two) times daily.  Dispense: 20 tablet; Refill: 0  2. COPD with chronic bronchitis (HCC) - levalbuterol (XOPENEX) nebulizer solution 1.25 mg - predniSONE (DELTASONE) 20 MG tablet; Take 3 tabs daily for 1 week, then 2 tabs for 1 week, then 1 tab for one week  Dispense: 42 tablet; Refill: 0 - doxycycline (VIBRA-TABS) 100 MG tablet; Take 1 tablet (100 mg total) by mouth 2 (two) times daily.  Dispense: 20 tablet; Refill: 0  3. Wheezing - levalbuterol (XOPENEX) nebulizer solution 1.25 mg  4. Cough - levalbuterol (XOPENEX) nebulizer solution 1.25 mg   Lungs greatly improved after  nebulizer Will do steroids and doxycycline  Vital signs stable at this time, if SOB worsens go to ED!! Continue nebs at home for wheezing and SOB RTO in 2 days to recheck    Evelina Dun, FNP

## 2018-10-28 ENCOUNTER — Other Ambulatory Visit: Payer: Self-pay

## 2018-10-28 ENCOUNTER — Ambulatory Visit (INDEPENDENT_AMBULATORY_CARE_PROVIDER_SITE_OTHER): Payer: Medicare HMO | Admitting: Family

## 2018-10-28 ENCOUNTER — Encounter: Payer: Self-pay | Admitting: Family

## 2018-10-28 VITALS — BP 113/64 | HR 70 | Temp 97.9°F | Ht 70.0 in | Wt 238.0 lb

## 2018-10-28 DIAGNOSIS — J441 Chronic obstructive pulmonary disease with (acute) exacerbation: Secondary | ICD-10-CM | POA: Diagnosis not present

## 2018-10-28 DIAGNOSIS — F17200 Nicotine dependence, unspecified, uncomplicated: Secondary | ICD-10-CM

## 2018-10-28 NOTE — Patient Instructions (Signed)

## 2018-10-28 NOTE — Progress Notes (Signed)
Subjective:    Patient ID: [name], female    DOB: 04/07/1956, 63 y.o.   MRN: [id number]  Chief Complaint  Patient presents with  . recheck breathing   Pt presents to the office today to recheck COPD exacerbation. She was seen on  10/26/18 with SOB and wheezing. She was given Depo-Medrol injection and neb treatment. Her wheezing and SOB improved. Her chest x-ray was negative for pneumonia.   She sent home on doxycyline and and prednisone tamper. She reports her breathing is slightly better, but feels weak.  Shortness of Breath  This is a recurrent problem. The current episode started in the past 7 days. Associated symptoms include headaches and orthopnea. Pertinent negatives include no chest pain, ear pain, fever, sore throat, sputum production or vomiting. She has tried rest, oral steroids and OTC cough suppressants for the symptoms. The treatment provided mild relief. Her past medical history is significant for COPD.      Review of Systems  Constitutional: Negative for fever.  HENT: Negative for ear pain and sore throat.   Respiratory: Positive for shortness of breath. Negative for sputum production.   Cardiovascular: Positive for orthopnea. Negative for chest pain.  Gastrointestinal: Negative for vomiting.  Neurological: Positive for headaches.  All other systems reviewed and are negative.      Objective:   Physical Exam Vitals signs reviewed.  Constitutional:      General: She is not in acute distress.    Appearance: She is well-developed. She is ill-appearing.  HENT:     Head: Normocephalic and atraumatic.  Eyes:     Pupils: Pupils are equal, round, and reactive to light.  Neck:     Musculoskeletal: Normal range of motion and neck supple.     Thyroid: No thyromegaly.  Cardiovascular:     Rate and Rhythm: Normal rate and regular rhythm.     Heart sounds: Normal heart sounds. No murmur.  Pulmonary:     Effort: Pulmonary effort is normal. No respiratory  distress.     Breath sounds: Wheezing (greatly improved) present.  Abdominal:     General: Bowel sounds are normal. There is no distension.     Palpations: Abdomen is soft.     Tenderness: There is no abdominal tenderness.  Musculoskeletal: Normal range of motion.        General: No tenderness.  Skin:    General: Skin is warm and dry.  Neurological:     Mental Status: She is alert and oriented to person, place, and time.     Cranial Nerves: No cranial nerve deficit.     Deep Tendon Reflexes: Reflexes are normal and symmetric.  Psychiatric:        Behavior: Behavior normal.        Thought Content: Thought content normal.        Judgment: Judgment normal.       BP 113/64   Pulse 70   Temp 97.9 F (36.6 C) (Oral)   Ht 5\' 10"  (1.778 m)   Wt 238 lb (108 kg)   SpO2 97%   BMI 34.15 kg/m      Assessment & Plan:  [name] comes in today with chief complaint of recheck breathing   Diagnosis and orders addressed:  1. COPD exacerbation (HCC) Continue doxycyline and prednisone  Continue home nebs and albuterol  Smoking cessation discussed RTO in 8 days or sooner if symptoms worsen    2. Current smoker Smoking cessation discussed  Evelina Dun, FNP

## 2018-11-02 ENCOUNTER — Telehealth: Payer: Self-pay | Admitting: Family Medicine

## 2018-11-02 NOTE — Telephone Encounter (Signed)
This can happen with prednisone. She can stop the prednisone if she wishes. Is her breathing improved?

## 2018-11-02 NOTE — Telephone Encounter (Signed)
Aware can cause shakiness.  She plans to take smaller dose  and stretch it out longer.

## 2018-11-03 ENCOUNTER — Other Ambulatory Visit: Payer: Self-pay

## 2018-11-03 ENCOUNTER — Ambulatory Visit (INDEPENDENT_AMBULATORY_CARE_PROVIDER_SITE_OTHER): Payer: Medicare HMO | Admitting: Family Medicine

## 2018-11-03 ENCOUNTER — Encounter: Payer: Self-pay | Admitting: Family Medicine

## 2018-11-03 VITALS — BP 113/69 | HR 74 | Temp 98.1°F | Ht 70.0 in | Wt 240.2 lb

## 2018-11-03 DIAGNOSIS — J449 Chronic obstructive pulmonary disease, unspecified: Secondary | ICD-10-CM

## 2018-11-03 NOTE — Progress Notes (Signed)
BP 113/69   Pulse 74   Temp 98.1 F (36.7 C) (Oral)   Ht 5\' 10"  (1.778 m)   Wt 240 lb 3.2 oz (109 kg)   SpO2 93%   BMI 34.47 kg/m    Subjective:   Patient ID: [name], female    DOB: [birth year], 63 y.o.   MRN: [id number]  HPI: [name] is a 63 y.o. female presenting on 11/03/2018 for COPD (1 week follow up- Patient states that it is better but she had to cut back on the prednisone )   HPI COPD Patient is coming in for COPD recheck today.  He is currently on albuterol and Symbicort and Spiriva and prednisone from her recent exacerbation, she says the prednisone is really making her jittery and shaky so she is had the lower it down and it has been better and she feels like her breathing and cough is been resolving and almost completely resolved today.  She denies any fevers or chills, she has minimal wheezing left she says and her cough is been mostly dry.    Relevant past medical, surgical, family and social history reviewed and updated as indicated. Interim medical history since our last visit reviewed. Allergies and medications reviewed and updated.  Review of Systems  Constitutional: Negative for chills and fever.  HENT: Negative for congestion, ear discharge and ear pain.   Eyes: Negative for redness and visual disturbance.  Respiratory: Positive for cough and wheezing. Negative for chest tightness and shortness of breath.   Cardiovascular: Negative for chest pain and leg swelling.  Musculoskeletal: Negative for back pain and gait problem.  Skin: Negative for rash.  Neurological: Negative for light-headedness and headaches.  Psychiatric/Behavioral: Negative for agitation and behavioral problems.  All other systems reviewed and are negative.   Per HPI unless specifically indicated above   Allergies as of 11/03/2018      Reactions   Fenofibrate Other (See Comments)   Flu-like    Penicillin G Other (See Comments)   Blisters in mouth   Pravastatin Other  (See Comments)   "flu like symptoms"       Medication List       Accurate as of November 03, 2018 11:24 AM. Always use your most recent med list.        albuterol (2.5 MG/3ML) 0.083% nebulizer solution Commonly known as:  PROVENTIL Take 3 mLs (2.5 mg total) by nebulization every 6 (six) hours as needed for wheezing or shortness of breath.   albuterol 108 (90 Base) MCG/ACT inhaler Commonly known as:  Ventolin HFA Inhale 2 puffs into the lungs every 6 (six) hours as needed. For shortness of breath   budesonide-formoterol 160-4.5 MCG/ACT inhaler Commonly known as:  Symbicort Take 2 puffs first thing in am and then another 2 puffs about 12 hours later.   cholecalciferol 1000 units tablet Commonly known as:  VITAMIN D Take 1,000 Units by mouth daily.   citalopram 20 MG tablet Commonly known as:  CELEXA TAKE ONE (1) TABLET EACH DAY   cycloSPORINE 0.05 % ophthalmic emulsion Commonly known as:  RESTASIS 1 drop 2 (two) times daily.   doxycycline 100 MG tablet Commonly known as:  VIBRA-TABS Take 1 tablet (100 mg total) by mouth 2 (two) times daily.   folic acid 174 MCG tablet Commonly known as:  FOLVITE Take 0.5 tablets (400 mcg total) by mouth daily.   furosemide 20 MG tablet Commonly known as:  LASIX Take 1-2 tablets (20-40 mg  total) by mouth daily. As needed   gabapentin 600 MG tablet Commonly known as:  NEURONTIN Take 1 tablet (600 mg total) by mouth 3 (three) times daily.   loratadine 10 MG tablet Commonly known as:  CLARITIN Take 10 mg daily by mouth.   Melatonin 10 MG Tabs Take 1 tablet by mouth at bedtime.   pantoprazole 40 MG tablet Commonly known as:  PROTONIX TAKE ONE (1) TABLET EACH DAY   predniSONE 20 MG tablet Commonly known as:  Deltasone Take 3 tabs daily for 1 week, then 2 tabs for 1 week, then 1 tab for one week   tiotropium 18 MCG inhalation capsule Commonly known as:  Spiriva HandiHaler Place 1 capsule (18 mcg total) into inhaler and inhale  daily.   varenicline 0.5 MG X 11 & 1 MG X 42 tablet Commonly known as:  Chantix Starting Month Pak Take one 0.5 mg tablet by mouth daily for 3 days, then one 0.5 mg tablet twice daily for 4 days, then one 1 mg tablet twice daily.   varenicline 1 MG tablet Commonly known as:  Chantix Continuing Month Pak Take 1 tablet (1 mg total) by mouth 2 (two) times daily.        Objective:   BP 113/69   Pulse 74   Temp 98.1 F (36.7 C) (Oral)   Ht 5\' 10"  (1.778 m)   Wt 240 lb 3.2 oz (109 kg)   SpO2 93%   BMI 34.47 kg/m   Wt Readings from Last 3 Encounters:  11/03/18 240 lb 3.2 oz (109 kg)  10/28/18 238 lb (108 kg)  10/26/18 240 lb 9.6 oz (109.1 kg)    Physical Exam Vitals signs reviewed.  Constitutional:      General: She is not in acute distress.    Appearance: She is well-developed. She is not diaphoretic.  HENT:     Nose: No mucosal edema or rhinorrhea.     Right Sinus: No maxillary sinus tenderness or frontal sinus tenderness.     Left Sinus: No maxillary sinus tenderness or frontal sinus tenderness.     Mouth/Throat:     Pharynx: Uvula midline. No oropharyngeal exudate or posterior oropharyngeal erythema.     Tonsils: No tonsillar abscesses.  Eyes:     Conjunctiva/sclera: Conjunctivae normal.  Cardiovascular:     Rate and Rhythm: Normal rate and regular rhythm.     Heart sounds: Normal heart sounds. No murmur.  Pulmonary:     Effort: Pulmonary effort is normal. No respiratory distress.     Breath sounds: Normal breath sounds. No wheezing, rhonchi or rales.  Musculoskeletal: Normal range of motion.        General: No tenderness.  Skin:    General: Skin is warm and dry.     Findings: No rash.  Neurological:     Mental Status: She is alert and oriented to person, place, and time.     Coordination: Coordination normal.  Psychiatric:        Behavior: Behavior normal.       Assessment & Plan:   Problem List Items Addressed This Visit      Respiratory   COPD  with chronic bronchitis (HCC)   COPD  GOLD II/ AB - Primary      Improving and almost resolved, lungs sound great today, she is feeling a lot better, instructed her to hunker down over the next few weeks to a month and keep away from anybody who possibly could have coronavirus.  She currently does dog grooming out of the house and she will stop that for now until this clears. Follow up plan: Return if symptoms worsen or fail to improve.  Counseling provided for all of the vaccine components No orders of the defined types were placed in this encounter.   Caryl Pina, MD [facility] 11/03/2018, 11:24 AM

## 2018-11-08 ENCOUNTER — Telehealth: Payer: Self-pay | Admitting: Family Medicine

## 2018-11-08 MED ORDER — NYSTATIN 100000 UNIT/ML MT SUSP: 5.0000 mL | Freq: Four times a day (QID) | OROMUCOSAL | 2 refills | Status: DC

## 2018-11-08 NOTE — Telephone Encounter (Signed)
Nystatin  Prescription sent to pharmacy   

## 2018-11-08 NOTE — Telephone Encounter (Signed)
Pharmacy: [facility]   PT states that she has Thrush and doesn't know if it is coming from the [facility] or the doxcycline wants to know if something can be sent to pharmacy for it.

## 2018-11-08 NOTE — Telephone Encounter (Signed)
Left message , script sent to pharmacy,  please call our office for any further questions.

## 2018-11-18 ENCOUNTER — Other Ambulatory Visit: Payer: Self-pay

## 2018-11-18 ENCOUNTER — Ambulatory Visit (INDEPENDENT_AMBULATORY_CARE_PROVIDER_SITE_OTHER): Payer: Medicare HMO | Admitting: *Deleted

## 2018-11-18 DIAGNOSIS — D518 Other vitamin B12 deficiency anemias: Secondary | ICD-10-CM

## 2018-11-18 NOTE — Progress Notes (Signed)
Pt given Cyanocobalamin inj Tolerated well 

## 2018-12-17 ENCOUNTER — Other Ambulatory Visit: Payer: Self-pay

## 2018-12-20 ENCOUNTER — Ambulatory Visit (INDEPENDENT_AMBULATORY_CARE_PROVIDER_SITE_OTHER): Payer: Medicare HMO | Admitting: *Deleted

## 2018-12-20 ENCOUNTER — Other Ambulatory Visit: Payer: Self-pay

## 2018-12-20 DIAGNOSIS — D518 Other vitamin B12 deficiency anemias: Secondary | ICD-10-CM

## 2018-12-20 NOTE — Progress Notes (Signed)
Pt given Cyanocobalamin inj Tolerated well 

## 2019-01-21 ENCOUNTER — Other Ambulatory Visit: Payer: Self-pay

## 2019-01-21 ENCOUNTER — Ambulatory Visit (INDEPENDENT_AMBULATORY_CARE_PROVIDER_SITE_OTHER): Payer: Medicare HMO | Admitting: *Deleted

## 2019-01-21 DIAGNOSIS — D518 Other vitamin B12 deficiency anemias: Secondary | ICD-10-CM | POA: Diagnosis not present

## 2019-01-21 NOTE — Progress Notes (Signed)
Pt given Cyanocobalamin inj Tolerated well 

## 2019-01-24 ENCOUNTER — Ambulatory Visit (INDEPENDENT_AMBULATORY_CARE_PROVIDER_SITE_OTHER): Payer: Medicare HMO | Admitting: Family Medicine

## 2019-01-24 ENCOUNTER — Other Ambulatory Visit: Payer: Self-pay

## 2019-01-24 ENCOUNTER — Encounter: Payer: Self-pay | Admitting: Family Medicine

## 2019-01-24 VITALS — BP 115/56 | HR 70 | Temp 97.4°F | Ht 70.0 in | Wt 238.4 lb

## 2019-01-24 DIAGNOSIS — E782 Mixed hyperlipidemia: Secondary | ICD-10-CM

## 2019-01-24 DIAGNOSIS — F411 Generalized anxiety disorder: Secondary | ICD-10-CM

## 2019-01-24 DIAGNOSIS — K1379 Other lesions of oral mucosa: Secondary | ICD-10-CM

## 2019-01-24 DIAGNOSIS — E1169 Type 2 diabetes mellitus with other specified complication: Secondary | ICD-10-CM

## 2019-01-24 DIAGNOSIS — J449 Chronic obstructive pulmonary disease, unspecified: Secondary | ICD-10-CM | POA: Diagnosis not present

## 2019-01-24 DIAGNOSIS — J4489 Other specified chronic obstructive pulmonary disease: Secondary | ICD-10-CM

## 2019-01-24 LAB — BAYER DCA HB A1C WAIVED: HB A1C (BAYER DCA - WAIVED): 5.9 % (ref <7.0)

## 2019-01-24 NOTE — Progress Notes (Signed)
BP (!) 115/56   Pulse 70   Temp (!) 97.4 F (36.3 C) (Oral)   Ht 5\' 10"  (1.778 m)   Wt 238 lb 6.4 oz (108.1 kg)   BMI 34.21 kg/m    Subjective:   Patient ID: [name], female    DOB: 1956/01/10, 63 y.o.   MRN: [id number]  HPI: [name] is a 63 y.o. female presenting on 01/24/2019 for Hyperlipidemia (3 month follow up) and COPD   HPI Type 2 diabetes mellitus Patient comes in today for recheck of his diabetes. Patient has been currently taking no medication, she is recently changed from prediabetes to diabetes and we discussed diet lifestyle and her A1c last time was 6.9 we will recheck it today.  If it is over 7 we discussed starting medications including metformin.. Patient is currently on an ACE inhibitor/ARB. Patient has seen an ophthalmologist this year. Patient denies any new issues with their feet.  Patient does have some amount of neuropathy that she complains of both of her feet and takes gabapentin for this.  Hyperlipidemia Patient is coming in for recheck of his hyperlipidemia. The patient is currently taking no medication currently we are monitoring, we will recheck this today. They deny any issues with myalgias or history of liver damage from it. They deny any focal numbness or weakness or chest pain.   COPD Patient is coming in for COPD recheck today.  He is currently on Symbicort and Spiriva and says that she is doing very well on them although she is meeting the donut hole now and she says they are going to become unaffordable for her, she is going to try and contact the county health department for prescription assistance through the program that they have there.  He has a mild chronic cough but denies any major coughing spells or wheezing spells.  He has 0nighttime symptoms per week and 0daytime symptoms per week currently.   Anxiety Patient says her anxiety is doing very well on the citalopram and she is very satisfied with how is been doing even with  multiple losses in the family he still feels like it is been helping her process and she denies it being overwhelming.  Patient denies any suicidal ideations or thoughts of major depression.  Mouth sore Patient notes a spot on the back of her left tongue when she had some thrush and she treated the thrush but then she noticed the spot that has been there.  She says is not hurting her but is just a weird looking lesion that she wanted to get checked out.  Relevant past medical, surgical, family and social history reviewed and updated as indicated. Interim medical history since our last visit reviewed. Allergies and medications reviewed and updated.  Review of Systems  Constitutional: Negative for chills and fever.  HENT: Positive for mouth sores.   Eyes: Negative for visual disturbance.  Respiratory: Negative for chest tightness and shortness of breath.   Cardiovascular: Negative for chest pain and leg swelling.  Musculoskeletal: Negative for back pain and gait problem.  Skin: Negative for rash.  Neurological: Negative for light-headedness and headaches.  Psychiatric/Behavioral: Negative for agitation, behavioral problems, dysphoric mood, self-injury, sleep disturbance and suicidal ideas. The patient is not nervous/anxious.   All other systems reviewed and are negative.   Per HPI unless specifically indicated above   Allergies as of 01/24/2019      Reactions   Fenofibrate Other (See Comments)   Flu-like  Penicillin G Other (See Comments)   Blisters in mouth   Pravastatin Other (See Comments)   "flu like symptoms"       Medication List       Accurate as of January 24, 2019  9:32 AM. If you have any questions, ask your nurse or doctor.        STOP taking these medications   doxycycline 100 MG tablet Commonly known as: VIBRA-TABS Stopped by: [name] , MD   predniSONE 20 MG tablet Commonly known as: Deltasone Stopped by: Worthy Rancher, MD     TAKE these  medications   albuterol (2.5 MG/3ML) 0.083% nebulizer solution Commonly known as: PROVENTIL Take 3 mLs (2.5 mg total) by nebulization every 6 (six) hours as needed for wheezing or shortness of breath.   albuterol 108 (90 Base) MCG/ACT inhaler Commonly known as: Ventolin HFA Inhale 2 puffs into the lungs every 6 (six) hours as needed. For shortness of breath   budesonide-formoterol 160-4.5 MCG/ACT inhaler Commonly known as: Symbicort Take 2 puffs first thing in am and then another 2 puffs about 12 hours later.   cholecalciferol 1000 units tablet Commonly known as: VITAMIN D Take 1,000 Units by mouth daily.   citalopram 20 MG tablet Commonly known as: CELEXA TAKE ONE (1) TABLET EACH DAY   cycloSPORINE 0.05 % ophthalmic emulsion Commonly known as: RESTASIS 1 drop 2 (two) times daily.   folic acid 270 MCG tablet Commonly known as: FOLVITE Take 0.5 tablets (400 mcg total) by mouth daily.   furosemide 20 MG tablet Commonly known as: LASIX Take 1-2 tablets (20-40 mg total) by mouth daily. As needed   gabapentin 600 MG tablet Commonly known as: NEURONTIN Take 1 tablet (600 mg total) by mouth 3 (three) times daily.   loratadine 10 MG tablet Commonly known as: CLARITIN Take 10 mg daily by mouth.   Melatonin 10 MG Tabs Take 1 tablet by mouth at bedtime.   nystatin 100000 UNIT/ML suspension Commonly known as: MYCOSTATIN Take 5 mLs (500,000 Units total) by mouth 4 (four) times daily.   pantoprazole 40 MG tablet Commonly known as: PROTONIX TAKE ONE (1) TABLET EACH DAY   tiotropium 18 MCG inhalation capsule Commonly known as: Spiriva HandiHaler Place 1 capsule (18 mcg total) into inhaler and inhale daily.   varenicline 0.5 MG X 11 & 1 MG X 42 tablet Commonly known as: Chantix Starting Month Pak Take one 0.5 mg tablet by mouth daily for 3 days, then one 0.5 mg tablet twice daily for 4 days, then one 1 mg tablet twice daily. What changed: Another medication with the same  name was removed. Continue taking this medication, and follow the directions you see here. Changed by: [name] , MD        Objective:   BP (!) 115/56   Pulse 70   Temp (!) 97.4 F (36.3 C) (Oral)   Ht 5\' 10"  (1.778 m)   Wt 238 lb 6.4 oz (108.1 kg)   BMI 34.21 kg/m   Wt Readings from Last 3 Encounters:  01/24/19 238 lb 6.4 oz (108.1 kg)  11/03/18 240 lb 3.2 oz (109 kg)  10/28/18 238 lb (108 kg)    Physical Exam Vitals signs and nursing note reviewed.  Constitutional:      General: She is not in acute distress.    Appearance: She is well-developed. She is not diaphoretic.  HENT:     Mouth/Throat:     Mouth: Mucous membranes are moist.  Tongue: Lesions present.     Pharynx: Oropharynx is clear.     Tonsils: No tonsillar exudate or tonsillar abscesses.   Eyes:     Conjunctiva/sclera: Conjunctivae normal.  Cardiovascular:     Rate and Rhythm: Normal rate and regular rhythm.     Heart sounds: Normal heart sounds. No murmur.  Pulmonary:     Effort: Pulmonary effort is normal. No respiratory distress.     Breath sounds: Normal breath sounds. No wheezing.  Musculoskeletal: Normal range of motion.        General: No tenderness.  Skin:    General: Skin is warm and dry.     Findings: No rash.  Neurological:     Mental Status: She is alert and oriented to person, place, and time.     Coordination: Coordination normal.  Psychiatric:        Behavior: Behavior normal.       Assessment & Plan:   Problem List Items Addressed This Visit      Respiratory   COPD with chronic bronchitis ([place])   COPD  GOLD II/ AB     Endocrine   Type 2 diabetes mellitus with other specified complication ([place]) - Primary   Relevant Orders   Bayer DCA Hb A1c Waived     Other   Mixed hyperlipidemia   Generalized anxiety disorder       Follow up plan: Return in about 3 months (around 04/26/2019), or if symptoms worsen or fail to improve, for Diabetes and cholesterol and  COPD recheck.  Counseling provided for all of the vaccine components Orders Placed This Encounter  Procedures  . [facility] Hb A1c Ransom, MD [facility] 01/24/2019, 9:32 AM

## 2019-01-24 NOTE — Addendum Note (Signed)
Addended by: Caryl Pina on: 01/24/2019 10:01 AM   Modules accepted: Orders

## 2019-02-21 ENCOUNTER — Other Ambulatory Visit: Payer: Self-pay

## 2019-02-21 ENCOUNTER — Ambulatory Visit (INDEPENDENT_AMBULATORY_CARE_PROVIDER_SITE_OTHER): Payer: Medicare HMO | Admitting: *Deleted

## 2019-02-21 DIAGNOSIS — D518 Other vitamin B12 deficiency anemias: Secondary | ICD-10-CM | POA: Diagnosis not present

## 2019-02-21 NOTE — Progress Notes (Signed)
Pt given Cyanocobalamin inj Tolerated well 

## 2019-02-23 DIAGNOSIS — C44309 Unspecified malignant neoplasm of skin of other parts of face: Secondary | ICD-10-CM | POA: Insufficient documentation

## 2019-02-23 DIAGNOSIS — L570 Actinic keratosis: Secondary | ICD-10-CM | POA: Insufficient documentation

## 2019-02-24 DIAGNOSIS — C44319 Basal cell carcinoma of skin of other parts of face: Secondary | ICD-10-CM | POA: Diagnosis not present

## 2019-02-24 DIAGNOSIS — D485 Neoplasm of uncertain behavior of skin: Secondary | ICD-10-CM | POA: Diagnosis not present

## 2019-02-24 DIAGNOSIS — T300 Burn of unspecified body region, unspecified degree: Secondary | ICD-10-CM | POA: Diagnosis not present

## 2019-02-24 DIAGNOSIS — D225 Melanocytic nevi of trunk: Secondary | ICD-10-CM | POA: Diagnosis not present

## 2019-02-24 DIAGNOSIS — Z1283 Encounter for screening for malignant neoplasm of skin: Secondary | ICD-10-CM | POA: Diagnosis not present

## 2019-02-24 DIAGNOSIS — C4441 Basal cell carcinoma of skin of scalp and neck: Secondary | ICD-10-CM | POA: Diagnosis not present

## 2019-03-02 ENCOUNTER — Telehealth: Payer: Self-pay | Admitting: Family Medicine

## 2019-03-02 NOTE — Chronic Care Management (AMB) (Signed)
Chronic Care Management   Note  03/02/2019 Name: ABCDE ONEIL MRN: [id number] DOB: 27-Feb-1956  [name] is a 63 y.o. year old female who is a primary care patient of [name], [name], MD. I reached out to [name] by phone today in response to a referral sent by Ms. York Ram Belmonte's health plan.    Ms. Dunwoody was given information about Chronic Care Management services today including:  1. CCM service includes personalized support from designated clinical staff supervised by her physician, including individualized plan of care and coordination with other care providers 2. 24/7 contact phone numbers for assistance for urgent and routine care needs. 3. Service will only be billed when office clinical staff spend 20 minutes or more in a month to coordinate care. 4. Only one practitioner may furnish and bill the service in a calendar month. 5. The patient may stop CCM services at any time (effective at the end of the month) by phone call to the office staff. 6. The patient will be responsible for cost sharing (co-pay) of up to 20% of the service fee (after annual deductible is met).  Patient agreed to services and verbal consent obtained.   Follow up plan: Telephone appointment with CCM team member scheduled for: 03/10/2019  [place]  [email or web address]   ??[phone]

## 2019-03-10 ENCOUNTER — Ambulatory Visit: Payer: Medicare HMO | Admitting: *Deleted

## 2019-03-10 DIAGNOSIS — E1169 Type 2 diabetes mellitus with other specified complication: Secondary | ICD-10-CM

## 2019-03-10 DIAGNOSIS — J449 Chronic obstructive pulmonary disease, unspecified: Secondary | ICD-10-CM

## 2019-03-17 ENCOUNTER — Ambulatory Visit (INDEPENDENT_AMBULATORY_CARE_PROVIDER_SITE_OTHER): Payer: Medicare HMO | Admitting: Otolaryngology

## 2019-03-17 DIAGNOSIS — J351 Hypertrophy of tonsils: Secondary | ICD-10-CM | POA: Diagnosis not present

## 2019-03-28 ENCOUNTER — Other Ambulatory Visit: Payer: Self-pay

## 2019-03-28 ENCOUNTER — Ambulatory Visit (INDEPENDENT_AMBULATORY_CARE_PROVIDER_SITE_OTHER): Payer: Medicare HMO | Admitting: *Deleted

## 2019-03-28 DIAGNOSIS — D518 Other vitamin B12 deficiency anemias: Secondary | ICD-10-CM | POA: Diagnosis not present

## 2019-03-28 MED ORDER — CYANOCOBALAMIN 1000 MCG/ML IJ SOLN
1000.0000 ug | INTRAMUSCULAR | Status: AC
  Administered 2019-03-28 – 2020-02-06 (×11): 1000 ug via INTRAMUSCULAR

## 2019-03-28 NOTE — Progress Notes (Signed)
Pt given Cyanocobalamin inj Tolerated well 

## 2019-03-31 DIAGNOSIS — D485 Neoplasm of uncertain behavior of skin: Secondary | ICD-10-CM | POA: Diagnosis not present

## 2019-03-31 DIAGNOSIS — Z08 Encounter for follow-up examination after completed treatment for malignant neoplasm: Secondary | ICD-10-CM | POA: Diagnosis not present

## 2019-03-31 DIAGNOSIS — Z85828 Personal history of other malignant neoplasm of skin: Secondary | ICD-10-CM | POA: Diagnosis not present

## 2019-04-04 ENCOUNTER — Ambulatory Visit (INDEPENDENT_AMBULATORY_CARE_PROVIDER_SITE_OTHER): Payer: Medicare HMO | Admitting: Otolaryngology

## 2019-04-04 DIAGNOSIS — F17210 Nicotine dependence, cigarettes, uncomplicated: Secondary | ICD-10-CM

## 2019-04-04 DIAGNOSIS — J351 Hypertrophy of tonsils: Secondary | ICD-10-CM

## 2019-04-06 ENCOUNTER — Ambulatory Visit (INDEPENDENT_AMBULATORY_CARE_PROVIDER_SITE_OTHER): Payer: Medicare HMO | Admitting: *Deleted

## 2019-04-06 VITALS — Ht 70.0 in | Wt 238.3 lb

## 2019-04-06 DIAGNOSIS — Z0000 Encounter for general adult medical examination without abnormal findings: Secondary | ICD-10-CM

## 2019-04-06 NOTE — Patient Instructions (Signed)
[name] , Thank you for taking time to come for your Medicare Wellness Visit. I appreciate your ongoing commitment to your health goals. Please review the following plan we discussed and let me know if I can assist you in the future.   These are the goals we discussed: Goals    . Prevent falls    . Weight (lb) < 200 lb (90.7 kg)     Stay active        This is a list of the screening recommended for you and due dates:  Health Maintenance  Topic Date Due  . HIV Screening  06/16/1973  . Complete foot exam   04/29/2016  . Urine Protein Check  07/29/2016  . Mammogram  04/18/2018  . Eye exam for diabetics  12/16/2018  . Pap Smear  09/17/2023*  . Hemoglobin A1C  07/26/2019  . Tetanus Vaccine  10/02/2024  . Colon Cancer Screening  08/01/2025  . Pneumococcal vaccine  Completed  .  Hepatitis C: One time screening is recommended by Center for Disease Control  (CDC) for  adults born from [birth year] through 1963.   Completed  . Flu Shot  Discontinued  . Stool Blood Test  Discontinued  *Topic was postponed. The date shown is not the original due date.     Preventive Care 55-42 Years Old, Female Preventive care refers to visits with your health care provider and lifestyle choices that can promote health and wellness. This includes:  A yearly physical exam. This may also be called an annual well check.  Regular dental visits and eye exams.  Immunizations.  Screening for certain conditions.  Healthy lifestyle choices, such as eating a healthy diet, getting regular exercise, not using drugs or products that contain nicotine and tobacco, and limiting alcohol use. What can I expect for my preventive care visit? Physical exam Your health care provider will check your:  Height and weight. This may be used to calculate body mass index (BMI), which tells if you are at a healthy weight.  Heart rate and blood pressure.  Skin for abnormal spots. Counseling Your health care provider may ask  you questions about your:  Alcohol, tobacco, and drug use.  Emotional well-being.  Home and relationship well-being.  Sexual activity.  Eating habits.  Work and work Statistician.  Method of birth control.  Menstrual cycle.  Pregnancy history. What immunizations do I need?  Influenza (flu) vaccine  This is recommended every year. Tetanus, diphtheria, and pertussis (Tdap) vaccine  You may need a Td booster every 10 years. Varicella (chickenpox) vaccine  You may need this if you have not been vaccinated. Zoster (shingles) vaccine  You may need this after age 44. Measles, mumps, and rubella (MMR) vaccine  You may need at least one dose of MMR if you were born in 1957 or later. You may also need a second dose. Pneumococcal conjugate (PCV13) vaccine  You may need this if you have certain conditions and were not previously vaccinated. Pneumococcal polysaccharide (PPSV23) vaccine  You may need one or two doses if you smoke cigarettes or if you have certain conditions. Meningococcal conjugate (MenACWY) vaccine  You may need this if you have certain conditions. Hepatitis A vaccine  You may need this if you have certain conditions or if you travel or work in places where you may be exposed to hepatitis A. Hepatitis B vaccine  You may need this if you have certain conditions or if you travel or work in places where  you may be exposed to hepatitis B. Haemophilus influenzae type b (Hib) vaccine  You may need this if you have certain conditions. Human papillomavirus (HPV) vaccine  If recommended by your health care provider, you may need three doses over 6 months. You may receive vaccines as individual doses or as more than one vaccine together in one shot (combination vaccines). Talk with your health care provider about the risks and benefits of combination vaccines. What tests do I need? Blood tests  Lipid and cholesterol levels. These may be checked every 5 years, or  more frequently if you are over 68 years old.  Hepatitis C test.  Hepatitis B test. Screening  Lung cancer screening. You may have this screening every year starting at age 72 if you have a 30-pack-year history of smoking and currently smoke or have quit within the past 15 years.  Colorectal cancer screening. All adults should have this screening starting at age 56 and continuing until age 62. Your health care provider may recommend screening at age 35 if you are at increased risk. You will have tests every 1-10 years, depending on your results and the type of screening test.  Diabetes screening. This is done by checking your blood sugar (glucose) after you have not eaten for a while (fasting). You may have this done every 1-3 years.  Mammogram. This may be done every 1-2 years. Talk with your health care provider about when you should start having regular mammograms. This may depend on whether you have a family history of breast cancer.  BRCA-related cancer screening. This may be done if you have a family history of breast, ovarian, tubal, or peritoneal cancers.  Pelvic exam and Pap test. This may be done every 3 years starting at age 34. Starting at age 31, this may be done every 5 years if you have a Pap test in combination with an HPV test. Other tests  Sexually transmitted disease (STD) testing.  Bone density scan. This is done to screen for osteoporosis. You may have this scan if you are at high risk for osteoporosis. Follow these instructions at home: Eating and drinking  Eat a diet that includes fresh fruits and vegetables, whole grains, lean protein, and low-fat dairy.  Take vitamin and mineral supplements as recommended by your health care provider.  Do not drink alcohol if: ? Your health care provider tells you not to drink. ? You are pregnant, may be pregnant, or are planning to become pregnant.  If you drink alcohol: ? Limit how much you have to 0-1 drink a day. ? Be  aware of how much alcohol is in your drink. In the [place] one drink equals one 12 oz bottle of beer (355 mL), one 5 oz glass of wine (148 mL), or one 1 oz glass of hard liquor (44 mL). Lifestyle  Take daily care of your teeth and gums.  Stay active. Exercise for at least 30 minutes on 5 or more days each week.  Do not use any products that contain nicotine or tobacco, such as cigarettes, e-cigarettes, and chewing tobacco. If you need help quitting, ask your health care provider.  If you are sexually active, practice safe sex. Use a condom or other form of birth control (contraception) in order to prevent pregnancy and STIs (sexually transmitted infections).  If told by your health care provider, take low-dose aspirin daily starting at age 57. What's next?  Visit your health care provider once a year for a well check  visit.  Ask your health care provider how often you should have your eyes and teeth checked.  Stay up to date on all vaccines. This information is not intended to replace advice given to you by your health care provider. Make sure you discuss any questions you have with your health care provider. Document Released: 08/24/2015 Document Revised: 04/08/2018 Document Reviewed: 04/08/2018 Elsevier Patient Education  2020 [address].    BMI for Adults  Body mass index (BMI) is a number that is calculated from a person's weight and height. BMI may help to estimate how much of a person's weight is composed of fat. BMI can help identify those who may be at higher risk for certain medical problems. How is BMI used with adults? BMI is used as a screening tool to identify possible weight problems. It is used to check whether a person is obese, overweight, healthy weight, or underweight. How is BMI calculated? BMI measures your weight and compares it to your height. This can be done either in [place] ([place] or metric measurements. Note that charts are available to help you find your  BMI quickly and easily without having to do these calculations yourself. To calculate your BMI in [place] ([place] measurements, your health care provider will: 1. Measure your weight in pounds (lb). 2. Multiply the number of pounds by 703. ? For example, for a person who weighs 180 lb, multiply that number by 703, which equals 126,540. 3. Measure your height in inches (in). Then multiply that number by itself to get a measurement called "inches squared." ? For example, for a person who is 70 in tall, the "inches squared" measurement is 70 in x 70 in, which equals 4900 inches squared. 4. Divide the total from Step 2 (number of lb x 703) by the total from Step 3 (inches squared): 126,540  4900 = 25.8. This is your BMI. To calculate your BMI in metric measurements, your health care provider will: 1. Measure your weight in kilograms (kg). 2. Measure your height in meters (m). Then multiply that number by itself to get a measurement called "meters squared." ? For example, for a person who is 1.75 m tall, the "meters squared" measurement is 1.75 m x 1.75 m, which is equal to 3.1 meters squared. 3. Divide the number of kilograms (your weight) by the meters squared number. In this example: 70  3.1 = 22.6. This is your BMI. How is BMI interpreted? To interpret your results, your health care provider will use BMI charts to identify whether you are underweight, normal weight, overweight, or obese. The following guidelines will be used:  Underweight: BMI less than 18.5.  Normal weight: BMI between 18.5 and 24.9.  Overweight: BMI between 25 and 29.9.  Obese: BMI of 30 and above. Please note:  Weight includes both fat and muscle, so someone with a muscular build, such as an athlete, may have a BMI that is higher than 24.9. In cases like these, BMI is not an accurate measure of body fat.  To determine if excess body fat is the cause of a BMI of 25 or higher, further assessments may need to be done by a  health care provider.  BMI is usually interpreted in the same way for men and women. Why is BMI a useful tool? BMI is useful in two ways:  Identifying a weight problem that may be related to a medical condition, or that may increase the risk for medical problems.  Promoting lifestyle and diet  changes in order to reach a healthy weight. Summary  Body mass index (BMI) is a number that is calculated from a person's weight and height.  BMI may help to estimate how much of a person's weight is composed of fat. BMI can help identify those who may be at higher risk for certain medical problems.  BMI can be measured using English measurements or metric measurements.  To interpret your results, your health care provider will use BMI charts to identify whether you are underweight, normal weight, overweight, or obese. This information is not intended to replace advice given to you by your health care provider. Make sure you discuss any questions you have with your health care provider. Document Released: 04/08/2004 Document Revised: 07/10/2017 Document Reviewed: 06/10/2017 Elsevier Patient Education  2020 [employer].

## 2019-04-06 NOTE — Progress Notes (Signed)
MEDICARE ANNUAL WELLNESS VISIT  04/06/2019  Telephone Visit Disclaimer This Medicare AWV was conducted by telephone due to national recommendations for restrictions regarding the COVID-19 Pandemic (e.g. social distancing).  I verified, using two identifiers, that I am speaking with [name] or their authorized healthcare agent. I discussed the limitations, risks, security, and privacy concerns of performing an evaluation and management service by telephone and the potential availability of an in-person appointment in the future. The patient expressed understanding and agreed to proceed.   Subjective:  [name] is a 63 y.o. female patient of [name], [name], MD who had a Medicare Annual Wellness Visit today via telephone. Zulmy is Disabled and lives with their spouse. she has 2 children. she reports that she is socially active and does interact with friends/family regularly. she is minimally physically active and enjoys gardening.  Patient Care Team: [name], [name], MD as PCP - General (Family Medicine) Gaynelle Arabian, MD as Surgeon (Orthopedic Surgery) Ellwood Dense., MD as Referring Physician (Neurology) Roseanne Kaufman, MD as Consulting Physician (Orthopedic Surgery) Rogene Houston, MD as Consulting Physician (Gastroenterology) Tanda Rockers, MD as Consulting Physician (Pulmonary Disease) Satira Sark, MD as Consulting Physician (Cardiology) Weber Cooks, MD as Referring Physician (Orthopedic Surgery) Ilean China, RN as Registered Nurse  Advanced Directives 04/06/2019 03/29/2018 02/12/2017 06/14/2016 06/14/2016 08/02/2015 01/22/2015  Does Patient Have a Medical Advance Directive? No;Yes Yes Yes Yes Yes No;Yes Yes  Type of Advance Directive Living will;Healthcare Power of [place];Living will Out of facility DNR (pink MOST or yellow form) [place];Living will [place];Living will;Out  of facility DNR (pink MOST or yellow form) Living will Living will  Does patient want to make changes to medical advance directive? No - Patient declined No - Patient declined - No - Patient declined - - No - Patient declined  Copy of [place] in Chart? No - copy requested No - copy requested - No - copy requested - - Yes  Would patient like information on creating a medical advance directive? No - Patient declined - - - - - [facility] Utilization Over the Past 12 Months: # of hospitalizations or ER visits: 0 # of surgeries: 0  Review of Systems    Patient reports that her overall health is unchanged compared to last year.  Patient Reported Readings (BP, Pulse, CBG, Weight, etc) none  Review of Systems: No complaints  All other systems negative.  Pain Assessment Pain : No/denies pain     Current Medications & Allergies (verified) Allergies as of 04/06/2019      Reactions   Fenofibrate Other (See Comments)   Flu-like    Penicillin G Other (See Comments)   Blisters in mouth   Pravastatin Other (See Comments)   "flu like symptoms"       Medication List       Accurate as of April 06, 2019  2:46 PM. If you have any questions, ask your nurse or doctor.        albuterol (2.5 MG/3ML) 0.083% nebulizer solution Commonly known as: PROVENTIL Take 3 mLs (2.5 mg total) by nebulization every 6 (six) hours as needed for wheezing or shortness of breath.   albuterol 108 (90 Base) MCG/ACT inhaler Commonly known as: Ventolin HFA Inhale 2 puffs into the lungs every 6 (six) hours as needed. For shortness of breath   budesonide-formoterol 160-4.5 MCG/ACT  inhaler Commonly known as: Symbicort Take 2 puffs first thing in am and then another 2 puffs about 12 hours later.   cholecalciferol 1000 units tablet Commonly known as: VITAMIN D Take 1,000 Units by mouth daily.   citalopram 20 MG tablet Commonly known as: CELEXA TAKE ONE (1) TABLET EACH DAY    cycloSPORINE 0.05 % ophthalmic emulsion Commonly known as: RESTASIS 1 drop 2 (two) times daily.   folic acid Q000111Q MCG tablet Commonly known as: FOLVITE Take 0.5 tablets (400 mcg total) by mouth daily.   furosemide 20 MG tablet Commonly known as: LASIX Take 1-2 tablets (20-40 mg total) by mouth daily. As needed   gabapentin 600 MG tablet Commonly known as: NEURONTIN Take 1 tablet (600 mg total) by mouth 3 (three) times daily.   Melatonin 10 MG Tabs Take 1 tablet by mouth at bedtime.   pantoprazole 40 MG tablet Commonly known as: PROTONIX TAKE ONE (1) TABLET EACH DAY   tiotropium 18 MCG inhalation capsule Commonly known as: Spiriva HandiHaler Place 1 capsule (18 mcg total) into inhaler and inhale daily.       History (reviewed): Past Medical History:  Diagnosis Date  . Allergy    seasonal   . Anxiety   . Arthritis   . Cancer ([place])    SKIN / "BORDERLINE CERVICAL CANCER"  . COPD (chronic obstructive pulmonary disease) ([place])   . GERD (gastroesophageal reflux disease)   . History of skin cancer   . Mixed hyperlipidemia   . Multiple sclerosis ([place])   . Neuromuscular disorder (HCC)    MIGRAINES  . Neuropathy    FEET  . PONV (postoperative nausea and vomiting)   . Pre-diabetes   . Psoriasis   . Weakness of left side of body    DUE TO MS   Past Surgical History:  Procedure Laterality Date  . ABDOMINAL HYSTERECTOMY    . CHOLECYSTECTOMY    . COLONOSCOPY N/A 08/02/2015   Procedure: COLONOSCOPY;  Surgeon: Rogene Houston, MD;  Location[facility];  Service: Endoscopy;  Laterality: N/A;  12:00-moved to 1220 Ann to notify pt  . KNEE ARTHROSCOPY     RT  . LAPAROSCOPIC APPENDECTOMY N/A 02/12/2017   Procedure: APPENDECTOMY LAPAROSCOPIC;  Surgeon: Aviva Signs, MD;  Location: [facility] ORS;  Service: General;  Laterality: N/A;  . Left breast lumpectomy    . Left carpal tunnel release    . PARTIAL KNEE ARTHROPLASTY Left 01/22/2015   Procedure: LEFT KNEE MEDIAL  UNICOMPARTMENTAL KNEE;  Surgeon: Gaynelle Arabian, MD;  Location: [facility] ORS;  Service: Orthopedics;  Laterality: Left;  . Right total knee replacement  2010  . SHOULDER SURGERY Left   . Sinus cyst removal    . TOTAL KNEE REVISION  05/05/2012   Procedure: TOTAL KNEE REVISION;  Surgeon: Gearlean Alf, MD;  Location: [facility];  Service: Orthopedics;  Laterality: Right;  . WRIST SURGERY     rt   Family History  Problem Relation Age of Onset  . Dementia Mother   . Thyroid disease Mother        goiter   . Diabetes Mother   . Atrial fibrillation Father        Died with pulmonary embolus  . Emphysema Father   . Heart disease Father   . Other Father        blood clots   . Celiac disease Father   . GI problems Father        dumping syndrome - malabsorbtion   .  Cancer - Colon Maternal Grandmother   . Dementia Maternal Grandmother   . Heart disease Maternal Grandfather   . Stroke Maternal Grandfather   . Other Sister        hereditary = blood clots   . Parkinson's disease Brother   . Alcohol abuse Brother   . Arthritis Son   . Heart attack Paternal Grandmother   . Other Paternal Grandfather        appendix rupture  . Hypertension Brother   . Hypertension Son    Social History   Socioeconomic History  . Marital status: Married    Spouse name: Audry Pili   . Number of children: 2  . Years of education: Not on file  . Highest education level: Some college, no degree  Occupational History  . Occupation: diabled    Comment: Designer, multimedia  Social Needs  . Financial resource strain: Not hard at all  . Food insecurity    Worry: Never true    Inability: Never true  . Transportation needs    Medical: No    Non-medical: No  Tobacco Use  . Smoking status: Current Every Day Smoker    Packs/day: 0.50    Years: 40.00    Pack years: 20.00    Types: Cigarettes  . Smokeless tobacco: Never Used  . Tobacco comment: 3/4 pack a day sicne age 34  Substance and Sexual Activity  . Alcohol  use: No    Alcohol/week: 0.0 standard drinks  . Drug use: No  . Sexual activity: Not Currently  Lifestyle  . Physical activity    Days per week: 0 days    Minutes per session: 0 min  . Stress: Not at all  Relationships  . Social connections    Talks on phone: More than three times a week    Gets together: Once a week    Attends religious service: More than 4 times per year    Active member of club or organization: Yes    Attends meetings of clubs or organizations: More than 4 times per year    Relationship status: Married  Other Topics Concern  . Not on file  Social History Narrative   2 children  - 9 grandchildren     Activities of Daily Living In your present state of health, do you have any difficulty performing the following activities: 04/06/2019  Hearing? N  Vision? N  Difficulty concentrating or making decisions? N  Walking or climbing stairs? N  Dressing or bathing? N  Doing errands, shopping? N  Preparing Food and eating ? N  Using the Toilet? N  In the past six months, have you accidently leaked urine? N  Do you have problems with loss of bowel control? N  Managing your Medications? N  Managing your Finances? N  Housekeeping or managing your Housekeeping? N  Some recent data might be hidden    Patient Education/ Literacy How often do you need to have someone help you when you read instructions, pamphlets, or other written materials from your doctor or pharmacy?: 1 - Never What is the last grade level you completed in school?: Some College  Exercise Current Exercise Habits: The patient does not participate in regular exercise at present  Diet Patient reports consuming 3 meals a day and 2 snack(s) a day Patient reports that her primary diet is: Regular Patient reports that she does have regular access to food.   Depression Screen [facility] 2/9 Scores 04/06/2019 01/24/2019 10/28/2018 10/26/2018  10/22/2018 07/22/2018 06/03/2018  PHQ - 2 Score 0 0 0 0 2 2 0  PHQ- 9  Score - - - - 11 5 -     Fall Risk Fall Risk  04/06/2019 01/24/2019 06/03/2018 05/12/2018 04/15/2018  Falls in the past year? 0 0 No No No  Number falls in past yr: 0 - - - -  Injury with Fall? 0 - - - -  Comment - - - - -     Objective:  [name] seemed alert and oriented and she participated appropriately during our telephone visit.  Blood Pressure Weight BMI  BP Readings from Last 3 Encounters:  01/24/19 (!) 115/56  11/03/18 113/69  10/28/18 113/64   Wt Readings from Last 3 Encounters:  04/06/19 238 lb 5.1 oz (108.1 kg)  01/24/19 238 lb 6.4 oz (108.1 kg)  11/03/18 240 lb 3.2 oz (109 kg)   BMI Readings from Last 1 Encounters:  04/06/19 34.20 kg/m    *Unable to obtain current vital signs, weight, and BMI due to telephone visit type  Hearing/Vision  . San did not seem to have difficulty with hearing/understanding during the telephone conversation . Reports that she has not had a formal eye exam by an eye care professional within the past year . Reports that she has not had a formal hearing evaluation within the past year *Unable to fully assess hearing and vision during telephone visit type  Cognitive Function: 6CIT Screen 04/06/2019  What Year? 0 points  What month? 0 points  What time? 0 points  Count back from 20 0 points  Months in reverse 0 points  Repeat phrase 0 points  Total Score 0   (Normal:0-7, Significant for Dysfunction: >8)  Normal Cognitive Function Screening: Yes   Immunization & Health Maintenance Record Immunization History  Administered Date(s) Administered  . Influenza,inj,Quad PF,6+ Mos 06/06/2013  . Pneumococcal Conjugate-13 03/29/2018  . Pneumococcal Polysaccharide-23 09/12/2013  . Tdap 10/02/2014    Health Maintenance  Topic Date Due  . HIV Screening  06/16/1973  . FOOT EXAM  04/29/2016  . URINE MICROALBUMIN  07/29/2016  . MAMMOGRAM  04/18/2018  . OPHTHALMOLOGY EXAM  12/16/2018  . PAP SMEAR-Modifier  09/17/2023 (Originally  06/17/1979)  . HEMOGLOBIN A1C  07/26/2019  . TETANUS/TDAP  10/02/2024  . COLONOSCOPY  08/01/2025  . PNEUMOCOCCAL POLYSACCHARIDE VACCINE AGE 23-64 HIGH RISK  Completed  . Hepatitis C Screening  Completed  . INFLUENZA VACCINE  Discontinued  . COLON CANCER SCREENING ANNUAL FOBT  Discontinued       Assessment  This is a routine wellness examination for [name].  Health Maintenance: Due or Overdue Health Maintenance Due  Topic Date Due  . HIV Screening  06/16/1973  . FOOT EXAM  04/29/2016  . URINE MICROALBUMIN  07/29/2016  . MAMMOGRAM  04/18/2018  . OPHTHALMOLOGY EXAM  12/16/2018    [name] does not need a referral for Community Assistance: Care Management:   no Social Work:    no Prescription Assistance:  no Nutrition/Diabetes Education:  no   Plan:  Personalized Goals Goals Addressed   None    Personalized Health Maintenance & Screening Recommendations  Pneumococcal vaccine  Screening mammography Diabetes screening Glaucoma screening  Lung Cancer Screening Recommended: yes (Low Dose CT Chest recommended if Age 64-80 years, 30 pack-year currently smoking OR have quit w/in past 15 years) Hepatitis C Screening recommended: yes HIV Screening recommended: yes  Advanced Directives: Written information was not prepared per patient's request.  Referrals & Orders No orders of the defined types were placed in this encounter.   Follow-up Plan . Follow-up with [name], [name], MD as planned   I have personally reviewed and noted the following in the patient's chart:   . Medical and social history . Use of alcohol, tobacco or illicit drugs  . Current medications and supplements . Functional ability and status . Nutritional status . Physical activity . Advanced directives . List of other physicians . Hospitalizations, surgeries, and ER visits in previous 12 months . Vitals . Screenings to include cognitive, depression, and falls . Referrals and  appointments  In addition, I have reviewed and discussed with [name] certain preventive protocols, quality metrics, and best practice recommendations. A written personalized care plan for preventive services as well as general preventive health recommendations is available and can be mailed to the patient at her request.      Wardell Heath, LPN  [phone]

## 2019-04-07 ENCOUNTER — Ambulatory Visit (INDEPENDENT_AMBULATORY_CARE_PROVIDER_SITE_OTHER): Payer: Medicare HMO | Admitting: *Deleted

## 2019-04-07 DIAGNOSIS — J449 Chronic obstructive pulmonary disease, unspecified: Secondary | ICD-10-CM

## 2019-04-07 DIAGNOSIS — F411 Generalized anxiety disorder: Secondary | ICD-10-CM

## 2019-04-07 DIAGNOSIS — F17200 Nicotine dependence, unspecified, uncomplicated: Secondary | ICD-10-CM

## 2019-04-07 NOTE — Chronic Care Management (AMB) (Signed)
Chronic Care Management   Follow Up Note   04/07/2019 Name: [name] MRN: [id number] DOB: June 10, 1956  Referred by: [name], [name], MD Reason for referral : Chronic Care Management (RNCM follow up)   [name] is a 63 y.o. year old female who is a primary care patient of [name], [name], MD. The CCM team was consulted for assistance with chronic disease management and care coordination needs.    Review of patient status, including review of consultants reports, relevant laboratory and other test results, and collaboration with appropriate care team members and the patient's provider was performed as part of comprehensive patient evaluation and provision of chronic care management services.    SDOH (Social Determinants of Health) screening performed today: Tobacco Use. See Care Plan for related entries.   I spoke with [name] by telephone today.   Outpatient Encounter Medications as of 04/07/2019  Medication Sig  . albuterol (PROVENTIL) (2.5 MG/3ML) 0.083% nebulizer solution Take 3 mLs (2.5 mg total) by nebulization every 6 (six) hours as needed for wheezing or shortness of breath.  Marland Kitchen albuterol (VENTOLIN HFA) 108 (90 Base) MCG/ACT inhaler Inhale 2 puffs into the lungs every 6 (six) hours as needed. For shortness of breath  . budesonide-formoterol (SYMBICORT) 160-4.5 MCG/ACT inhaler Take 2 puffs first thing in am and then another 2 puffs about 12 hours later.  . cholecalciferol (VITAMIN D) 1000 units tablet Take 1,000 Units by mouth daily.  . citalopram (CELEXA) 20 MG tablet TAKE ONE (1) TABLET EACH DAY  . cycloSPORINE (RESTASIS) 0.05 % ophthalmic emulsion 1 drop 2 (two) times daily.  . folic acid (FOLVITE) Q000111Q MCG tablet Take 0.5 tablets (400 mcg total) by mouth daily.  . furosemide (LASIX) 20 MG tablet Take 1-2 tablets (20-40 mg total) by mouth daily. As needed  . gabapentin (NEURONTIN) 600 MG tablet Take 1 tablet (600 mg total) by mouth 3 (three) times daily.  .  Melatonin 10 MG TABS Take 1 tablet by mouth at bedtime.  . pantoprazole (PROTONIX) 40 MG tablet TAKE ONE (1) TABLET EACH DAY  . tiotropium (SPIRIVA HANDIHALER) 18 MCG inhalation capsule Place 1 capsule (18 mcg total) into inhaler and inhale daily.   Facility-Administered Encounter Medications as of 04/07/2019  Medication  . cyanocobalamin ((VITAMIN B-12)) injection 1,000 mcg    Goals Addressed            This Visit's Progress     Patient Stated   . "I want to quit smoking" (pt-stated)       Current Barriers:  . Nicotine addiction . Stress/anxiety  Nurse Case Manager Clinical Goal(s):  Marland Kitchen Over the next 60 days, patient will stop smoking.  Interventions:  . Evaluation of current treatment plan related to smoking cesssation and patient's adherence to plan as established by provider. . Provided education to patient re: smoking cessation strategies . Reviewed medications with patient and discussed previous Chantix prescription did not work . Patient plans to try CBD oil for stress management and see if that will help her decrease the amount of cigarettes she smokes  Patient Self Care Activities:  . Performs ADL's independently . Performs IADL's independently  Initial goal documentation     . 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   Nurse Case Manager Clinical Goal(s):  Marland Kitchen Over the next 30 days, patient  will speak with [facility] Prescription Program to complete prescription assistance applications for inhalers  Interventions:  . Talked with patient about prescription assistance applications. Financially she did not qualify for assistance Spiriva or Symbicort. She buying her inhalers on credit card now and making payments.  . She skips Spiriva on days that she's breathing ok in order to stretch it out . Verified that she is  receiving Albuterol Inhalers through [employer] . Discussed possibility of samples.  o Checked with [place] clinical staff regarding samples and they advised they have not received any o [employer] is manufacturer. Will attempt to identify and contact drug rep and request samples . Will consider referral to [place] team to see if they can recommend more cost effective medication options  Patient Self Care Activities:  . Performs ADL's independently . Performs IADL's independently  Please see past updates related to this goal by clicking on the "Past Updates" button in the selected goal          Plans The care management team will reach out to the patient again over the next 30 days.    Chong Sicilian BSN, RN-BC Embedded Chronic Care Manager [facility] [place] [facility] Direct Dial: [phone]

## 2019-04-07 NOTE — Chronic Care Management (AMB) (Signed)
Chronic Care Management   Initial Visit Note  03/10/2019 Name: [name] MRN: [id number] DOB: [birth year]  Referred by: [name], [name], MD Reason for referral : Chronic Care Management (RNCM Initial Visit)   [name] is a 63 y.o. year old female who is a primary care patient of [name], [name], MD. The CCM team was consulted for assistance with chronic disease management and care coordination needs.   Review of patient status, including review of consultants reports, relevant laboratory and other test results, and collaboration with appropriate care team members and the patient's provider was performed as part of comprehensive patient evaluation and provision of chronic care management services.    SDOH (Social Determinants of Health) screening performed today: Tobacco Use Physical Activity. See Care Plan for related entries.   I spoke with [name] by telephone  Medications: Outpatient Encounter Medications as of 03/10/2019  Medication Sig  . albuterol (PROVENTIL) (2.5 MG/3ML) 0.083% nebulizer solution Take 3 mLs (2.5 mg total) by nebulization every 6 (six) hours as needed for wheezing or shortness of breath.  Marland Kitchen albuterol (VENTOLIN HFA) 108 (90 Base) MCG/ACT inhaler Inhale 2 puffs into the lungs every 6 (six) hours as needed. For shortness of breath  . budesonide-formoterol (SYMBICORT) 160-4.5 MCG/ACT inhaler Take 2 puffs first thing in am and then another 2 puffs about 12 hours later.  . cholecalciferol (VITAMIN D) 1000 units tablet Take 1,000 Units by mouth daily.  . citalopram (CELEXA) 20 MG tablet TAKE ONE (1) TABLET EACH DAY  . cycloSPORINE (RESTASIS) 0.05 % ophthalmic emulsion 1 drop 2 (two) times daily.  . folic acid (FOLVITE) Q000111Q MCG tablet Take 0.5 tablets (400 mcg total) by mouth daily.  . furosemide (LASIX) 20 MG tablet Take 1-2 tablets (20-40 mg total) by mouth daily. As needed  . gabapentin (NEURONTIN) 600 MG tablet Take 1 tablet (600 mg total) by mouth  3 (three) times daily.  . Melatonin 10 MG TABS Take 1 tablet by mouth at bedtime.  . pantoprazole (PROTONIX) 40 MG tablet TAKE ONE (1) TABLET EACH DAY  . tiotropium (SPIRIVA HANDIHALER) 18 MCG inhalation capsule Place 1 capsule (18 mcg total) into inhaler and inhale daily.  . [DISCONTINUED] loratadine (CLARITIN) 10 MG tablet Take 10 mg daily by mouth.  . [DISCONTINUED] nystatin (MYCOSTATIN) 100000 UNIT/ML suspension Take 5 mLs (500,000 Units total) by mouth 4 (four) times daily.  . [DISCONTINUED] varenicline (CHANTIX STARTING MONTH PAK) 0.5 MG X 11 & 1 MG X 42 tablet Take one 0.5 mg tablet by mouth daily for 3 days, then one 0.5 mg tablet twice daily for 4 days, then one 1 mg tablet twice daily.   No facility-administered encounter medications on file as of 03/10/2019.     Lab Results  Component Value Date   HGBA1C 5.9 01/24/2019   HGBA1C 6.9 10/22/2018   HGBA1C 6.6 07/22/2018   Lab Results  Component Value Date   MICROALBUR 20 06/26/2014   LDLCALC 130 (H) 10/22/2018   CREATININE 0.82 10/22/2018    Goals Addressed            This Visit's Progress     Patient Stated   . "I want to keep my blood sugar under control" (pt-stated)       Current Barriers:  . Chronic Disease Management support and education needs related to diabetes management  Nurse Case Manager Clinical Goal(s):  Marland Kitchen Over the next 30 days, patient will work with [facility] to address needs related to self  management of diabetes  Interventions:  . Evaluation of current treatment plan related to diabetes and patient's adherence to plan as established by provider. . Provided education to patient re: diet and exercise . Discussed plans with patient for ongoing care management follow up and provided patient with direct contact information for care management team . Chart reviewed o A1C decreased to 5.9 in 01/2018 o No medication added since it improved  Patient Self Care Activities:  . Performs ADL's independently .  Performs IADL's independently  Initial goal documentation     . 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   Nurse Case Manager Clinical Goal(s):  Marland Kitchen Over the next 30 days, patient will speak with [facility] Prescription Program to complete prescription assistance applications for inhalers  Interventions:  . Reviewed medications with patient and discussed Spiriva, Symbicort, Albuterol . Chart reviewed o Dr [name] recommended patient contact [place] for Rx Assistance at appt in 01/2019. . If unable to get help with applications from [facility] then we can refer to [place] to assist with finding and completing forms.   Patient Self Care Activities:  . Performs ADL's independently . Performs IADL's independently  Initial goal documentation         Plan:  The care management team will reach out to the patient again over the next 30 days.   Chong Sicilian BSN, RN-BC Embedded Chronic Care Manager [facility] [place] [facility] Direct Dial: [phone]

## 2019-04-27 ENCOUNTER — Other Ambulatory Visit: Payer: Self-pay

## 2019-04-28 ENCOUNTER — Encounter: Payer: Self-pay | Admitting: Family Medicine

## 2019-04-28 ENCOUNTER — Ambulatory Visit (INDEPENDENT_AMBULATORY_CARE_PROVIDER_SITE_OTHER): Payer: Medicare HMO | Admitting: Family Medicine

## 2019-04-28 VITALS — BP 115/64 | HR 68 | Temp 96.4°F | Ht 70.0 in | Wt 241.6 lb

## 2019-04-28 DIAGNOSIS — E782 Mixed hyperlipidemia: Secondary | ICD-10-CM

## 2019-04-28 DIAGNOSIS — J449 Chronic obstructive pulmonary disease, unspecified: Secondary | ICD-10-CM

## 2019-04-28 DIAGNOSIS — F411 Generalized anxiety disorder: Secondary | ICD-10-CM | POA: Diagnosis not present

## 2019-04-28 DIAGNOSIS — K219 Gastro-esophageal reflux disease without esophagitis: Secondary | ICD-10-CM

## 2019-04-28 DIAGNOSIS — E1169 Type 2 diabetes mellitus with other specified complication: Secondary | ICD-10-CM

## 2019-04-28 DIAGNOSIS — E1142 Type 2 diabetes mellitus with diabetic polyneuropathy: Secondary | ICD-10-CM | POA: Diagnosis not present

## 2019-04-28 DIAGNOSIS — E119 Type 2 diabetes mellitus without complications: Secondary | ICD-10-CM | POA: Diagnosis not present

## 2019-04-28 DIAGNOSIS — D518 Other vitamin B12 deficiency anemias: Secondary | ICD-10-CM

## 2019-04-28 LAB — BAYER DCA HB A1C WAIVED: HB A1C (BAYER DCA - WAIVED): 6.6 % (ref <7.0)

## 2019-04-28 NOTE — Progress Notes (Signed)
BP 115/64   Pulse 68   Temp (!) 96.4 F (35.8 C) (Temporal)   Ht 5' 10"  (1.778 m)   Wt 241 lb 9.6 oz (109.6 kg)   BMI 34.67 kg/m    Subjective:   Patient ID: [name], female    DOB: 12-10-1955, 63 y.o.   MRN: [id number]  HPI: [name] is a 63 y.o. female presenting on 04/28/2019 for Diabetes, Hyperlipidemia, Anxiety, and COPD   HPI Type 2 diabetes mellitus Patient comes in today for recheck of his diabetes. Patient has been currently taking cinnamon. Patient is not currently on an ACE inhibitor/ARB. Patient has seen an ophthalmologist this year. Patient denies any issues with their feet.   Hypertension Patient is currently on no medication currently we are monitoring, and their blood pressure today is 115/64. Patient denies any lightheadedness or dizziness. Patient denies headaches, blurred vision, chest pains, shortness of breath, or weakness. Denies any side effects from medication and is content with current medication.   Hyperlipidemia Patient is coming in for recheck of his hyperlipidemia. The patient is currently taking no medication. They deny any issues with myalgias or history of liver damage from it. They deny any focal numbness or weakness or chest pain.   Anxiety Patient currently takes Celexa for anxiety and she feels like it is going very well for her and denies any major issues with it.  COPD Patient is coming in for COPD recheck today.  He is currently on Symbicort and Spiriva and albuterol.  He has a mild chronic cough but denies any major coughing spells or wheezing spells.  He has 0nighttime symptoms per week and 0daytime symptoms per week currently.   Relevant past medical, surgical, family and social history reviewed and updated as indicated. Interim medical history since our last visit reviewed. Allergies and medications reviewed and updated.  Review of Systems  Constitutional: Negative for chills and fever.  Eyes: Negative for visual  disturbance.  Respiratory: Negative for chest tightness and shortness of breath.   Cardiovascular: Negative for chest pain and leg swelling.  Musculoskeletal: Negative for back pain and gait problem.  Skin: Negative for rash.  Neurological: Negative for light-headedness and headaches.  Psychiatric/Behavioral: Positive for dysphoric mood. Negative for agitation, behavioral problems, self-injury, sleep disturbance and suicidal ideas. The patient is nervous/anxious.   All other systems reviewed and are negative.   Per HPI unless specifically indicated above   Allergies as of 04/28/2019      Reactions   Fenofibrate Other (See Comments)   Flu-like    Penicillin G Other (See Comments)   Blisters in mouth   Pravastatin Other (See Comments)   "flu like symptoms"       Medication List       Accurate as of April 28, 2019  3:59 PM. If you have any questions, ask your nurse or doctor.        albuterol (2.5 MG/3ML) 0.083% nebulizer solution Commonly known as: PROVENTIL Take 3 mLs (2.5 mg total) by nebulization every 6 (six) hours as needed for wheezing or shortness of breath.   albuterol 108 (90 Base) MCG/ACT inhaler Commonly known as: Ventolin HFA Inhale 2 puffs into the lungs every 6 (six) hours as needed. For shortness of breath   budesonide-formoterol 160-4.5 MCG/ACT inhaler Commonly known as: Symbicort Take 2 puffs first thing in am and then another 2 puffs about 12 hours later.   cholecalciferol 1000 units tablet Commonly known as: VITAMIN D Take 1,000  Units by mouth daily.   citalopram 20 MG tablet Commonly known as: CELEXA TAKE ONE (1) TABLET EACH DAY   cycloSPORINE 0.05 % ophthalmic emulsion Commonly known as: RESTASIS 1 drop 2 (two) times daily.   folic acid 903 MCG tablet Commonly known as: FOLVITE Take 0.5 tablets (400 mcg total) by mouth daily.   furosemide 20 MG tablet Commonly known as: LASIX Take 1-2 tablets (20-40 mg total) by mouth daily. As needed    gabapentin 600 MG tablet Commonly known as: NEURONTIN Take 1 tablet (600 mg total) by mouth 3 (three) times daily.   Melatonin 10 MG Tabs Take 1 tablet by mouth at bedtime.   pantoprazole 40 MG tablet Commonly known as: PROTONIX TAKE ONE (1) TABLET EACH DAY   tiotropium 18 MCG inhalation capsule Commonly known as: Spiriva HandiHaler Place 1 capsule (18 mcg total) into inhaler and inhale daily.        Objective:   BP 115/64   Pulse 68   Temp (!) 96.4 F (35.8 C) (Temporal)   Ht 5' 10"  (1.778 m)   Wt 241 lb 9.6 oz (109.6 kg)   BMI 34.67 kg/m   Wt Readings from Last 3 Encounters:  04/28/19 241 lb 9.6 oz (109.6 kg)  04/06/19 238 lb 5.1 oz (108.1 kg)  01/24/19 238 lb 6.4 oz (108.1 kg)    Physical Exam Vitals signs and nursing note reviewed.  Constitutional:      General: She is not in acute distress.    Appearance: She is well-developed. She is not diaphoretic.  Eyes:     Conjunctiva/sclera: Conjunctivae normal.  Cardiovascular:     Rate and Rhythm: Normal rate and regular rhythm.     Heart sounds: Normal heart sounds. No murmur.  Pulmonary:     Effort: Pulmonary effort is normal. No respiratory distress.     Breath sounds: Normal breath sounds. No wheezing.  Musculoskeletal: Normal range of motion.        General: No tenderness.  Skin:    General: Skin is warm and dry.     Findings: No rash.  Neurological:     Mental Status: She is alert and oriented to person, place, and time.     Coordination: Coordination normal.  Psychiatric:        Behavior: Behavior normal.     Results for orders placed or performed in visit on 01/24/19  Bayer DCA Hb A1c Waived  Result Value Ref Range   HB A1C (BAYER DCA - WAIVED) 5.9 <7.0 %    Assessment & Plan:   Problem List Items Addressed This Visit      Respiratory   COPD with chronic bronchitis ([place])   COPD  GOLD II/ AB     Digestive   Gastroesophageal reflux disease without esophagitis   Relevant Orders   CBC  with Differential/Platelet     Endocrine   Type 2 diabetes mellitus with other specified complication ([place]) - Primary   Relevant Orders   Bayer DCA Hb A1c Waived   CMP14+EGFR   Microalbumin / creatinine urine ratio     Other   Mixed hyperlipidemia   Relevant Orders   Lipid panel   Generalized anxiety disorder   Relevant Orders   CBC with Differential/Platelet      A1c 6.6 Continue diet control for diabetes  Continue citalopram, no changes in medication  Follow up plan: Return in about 6 months (around 10/26/2019), or if symptoms worsen or fail to improve, for Diabetes and  anxiety recheck.  Counseling provided for all of the vaccine components Orders Placed This Encounter  Procedures  . Bayer DCA Hb A1c Waived  . CBC with Differential/Platelet  . CMP14+EGFR  . Lipid panel    Caryl Pina, MD [facility] 04/28/2019, 3:59 PM

## 2019-04-29 LAB — CMP14+EGFR
ALT: 8 IU/L (ref 0–32)
AST: 10 IU/L (ref 0–40)
Albumin/Globulin Ratio: 2 (ref 1.2–2.2)
Albumin: 3.9 g/dL (ref 3.8–4.9)
Alkaline Phosphatase: 81 IU/L (ref 39–117)
BUN/Creatinine Ratio: 12 (ref 12–28)
BUN: 11 mg/dL (ref 8–27)
Bilirubin Total: 0.4 mg/dL (ref 0.0–1.2)
CO2: 24 mmol/L (ref 20–29)
Calcium: 8.6 mg/dL — ABNORMAL LOW (ref 8.7–10.3)
Chloride: 101 mmol/L (ref 96–106)
Creatinine, Ser: 0.9 mg/dL (ref 0.57–1.00)
GFR calc Af Amer: 80 mL/min/{1.73_m2} (ref >59)
GFR calc non Af Amer: 70 mL/min/{1.73_m2} (ref >59)
Globulin, Total: 2 g/dL (ref 1.5–4.5)
Glucose: 157 mg/dL — ABNORMAL HIGH (ref 65–99)
Potassium: 4 mmol/L (ref 3.5–5.2)
Sodium: 141 mmol/L (ref 134–144)
Total Protein: 5.9 g/dL — ABNORMAL LOW (ref 6.0–8.5)

## 2019-04-29 LAB — LIPID PANEL
Chol/HDL Ratio: 4.3 ratio (ref 0.0–4.4)
Cholesterol, Total: 204 mg/dL — ABNORMAL HIGH (ref 100–199)
HDL: 48 mg/dL (ref >39)
LDL Chol Calc (NIH): 132 mg/dL — ABNORMAL HIGH (ref 0–99)
Triglycerides: 135 mg/dL (ref 0–149)
VLDL Cholesterol Cal: 24 mg/dL (ref 5–40)

## 2019-04-29 LAB — CBC WITH DIFFERENTIAL/PLATELET
Basophils Absolute: 0.1 10*3/uL (ref 0.0–0.2)
Basos: 1 %
EOS (ABSOLUTE): 0.2 10*3/uL (ref 0.0–0.4)
Eos: 2 %
Hematocrit: 39.7 % (ref 34.0–46.6)
Hemoglobin: 13.1 g/dL (ref 11.1–15.9)
Immature Grans (Abs): 0 10*3/uL (ref 0.0–0.1)
Immature Granulocytes: 0 %
Lymphocytes Absolute: 3 10*3/uL (ref 0.7–3.1)
Lymphs: 29 %
MCH: 28.4 pg (ref 26.6–33.0)
MCHC: 33 g/dL (ref 31.5–35.7)
MCV: 86 fL (ref 79–97)
Monocytes Absolute: 0.5 10*3/uL (ref 0.1–0.9)
Monocytes: 5 %
Neutrophils Absolute: 6.6 10*3/uL (ref 1.4–7.0)
Neutrophils: 63 %
Platelets: 258 10*3/uL (ref 150–450)
RBC: 4.62 x10E6/uL (ref 3.77–5.28)
RDW: 12.4 % (ref 11.7–15.4)
WBC: 10.3 10*3/uL (ref 3.4–10.8)

## 2019-05-26 ENCOUNTER — Telehealth: Payer: Self-pay | Admitting: Family Medicine

## 2019-05-26 DIAGNOSIS — F411 Generalized anxiety disorder: Secondary | ICD-10-CM

## 2019-05-26 MED ORDER — CITALOPRAM HYDROBROMIDE 40 MG PO TABS: 40.0000 mg | ORAL_TABLET | Freq: Every day | ORAL | 3 refills | Status: DC

## 2019-05-26 NOTE — Telephone Encounter (Signed)
Please let patient know that I doubled her Celexa and she can take 2 of what she has left and that should be stronger and let us know how it goes.

## 2019-05-27 ENCOUNTER — Other Ambulatory Visit: Payer: Self-pay

## 2019-05-27 NOTE — Telephone Encounter (Signed)
Pt aware by detailed VM 

## 2019-05-30 ENCOUNTER — Other Ambulatory Visit: Payer: Self-pay

## 2019-05-30 ENCOUNTER — Ambulatory Visit (INDEPENDENT_AMBULATORY_CARE_PROVIDER_SITE_OTHER): Payer: Medicare HMO

## 2019-05-30 DIAGNOSIS — D518 Other vitamin B12 deficiency anemias: Secondary | ICD-10-CM | POA: Diagnosis not present

## 2019-05-30 NOTE — Progress Notes (Signed)
Cyanocobalamin injection given to right deltoid.  Patient tolerated well. 

## 2019-06-14 ENCOUNTER — Other Ambulatory Visit: Payer: Self-pay | Admitting: *Deleted

## 2019-06-14 ENCOUNTER — Encounter: Payer: Self-pay | Admitting: Nurse Practitioner

## 2019-06-14 ENCOUNTER — Ambulatory Visit (INDEPENDENT_AMBULATORY_CARE_PROVIDER_SITE_OTHER): Payer: Medicare HMO | Admitting: Nurse Practitioner

## 2019-06-14 DIAGNOSIS — R519 Headache, unspecified: Secondary | ICD-10-CM | POA: Diagnosis not present

## 2019-06-14 DIAGNOSIS — Z20822 Contact with and (suspected) exposure to covid-19: Secondary | ICD-10-CM

## 2019-06-14 DIAGNOSIS — Z20828 Contact with and (suspected) exposure to other viral communicable diseases: Secondary | ICD-10-CM

## 2019-06-14 DIAGNOSIS — R05 Cough: Secondary | ICD-10-CM

## 2019-06-14 DIAGNOSIS — R059 Cough, unspecified: Secondary | ICD-10-CM

## 2019-06-14 MED ORDER — AZITHROMYCIN 250 MG PO TABS: ORAL_TABLET | ORAL | 0 refills | Status: DC

## 2019-06-14 NOTE — Progress Notes (Signed)
Virtual Visit via telephone Note Due to COVID-19 pandemic this visit was conducted virtually. This visit type was conducted due to national recommendations for restrictions regarding the COVID-19 Pandemic (e.g. social distancing, sheltering in place) in an effort to limit this patient's exposure and mitigate transmission in our community. All issues noted in this document were discussed and addressed.  A physical exam was not performed with this format.  I connected with [name] on 06/14/19 at 12:20 by telephone and verified that I am speaking with the correct person using two identifiers. [name] is currently located at home and no one is currently with her during visit. The provider, Mary-Margaret Hassell Done, FNP is located in their office at time of visit.  I discussed the limitations, risks, security and privacy concerns of performing an evaluation and management service by telephone and the availability of in person appointments. I also discussed with the patient that there may be a patient responsible charge related to this service. The patient expressed understanding and agreed to proceed.   History and Present Illness: Patient ID: [name], female    DOB: August 10, 1956, 63 y.o.   MRN: [id number]   Chief Complaint: Bronchitis   HPI Patient calls in today c/o cough with slight SOB. headache started 2 days ago. She has had an exposure to covid last week. She went to [place] this  Morning and was tested for covid.   Review of Systems  Constitutional: Positive for chills. Negative for fever.  HENT: Positive for congestion, rhinorrhea and sore throat.   Respiratory: Positive for cough.   Cardiovascular: Negative for chest pain and leg swelling.  Neurological: Positive for headaches. Negative for dizziness.  Psychiatric/Behavioral: Negative.   All other systems reviewed and are negative.       Observations/Objective: Alert and oriented- answers all questions  appropriately Mild distress Voice hoare Slight cough noted No SOB noted   Assessment and Plan: [name] in today with chief complaint of Bronchitis   1. Cough 2 . Acute nonintractable headache, unspecified headache type 3. Close exposure to COVID-19 virus 1. Take meds as prescribed 2. Use a cool mist humidifier especially during the winter months and when heat has been humid. 3. Use saline nose sprays frequently 4. Saline irrigations of the nose can be very helpful if done frequently.  * 4X daily for 1 week*  * Use of a nettie pot can be helpful with this. Follow directions with this* 5. Drink plenty of fluids 6. Keep thermostat turn down low 7.For any cough or congestion  Use plain Mucinex- regular strength or max strength is fine   * Children- consult with Pharmacist for dosing 8. For fever or aces or pains- take tylenol or ibuprofen appropriate for age and weight.  * for fevers greater than 101 orally you may alternate ibuprofen and tylenol every  3 hours.   Quarantine for 14 days if covid positive Meds ordered this encounter  Medications  . azithromycin (ZITHROMAX Z-PAK) 250 MG tablet    Sig: As directed    Dispense:  6 tablet    Refill:  0    Cough with covid exposure    Order Specific Question:   Supervising Provider    Answer:   Caryl Pina A [id number]     Follow Up Instructions: prn    I discussed the assessment and treatment plan with the patient. The patient was provided an opportunity to ask questions and all were answered. The  patient agreed with the plan and demonstrated an understanding of the instructions.   The patient was advised to call back or seek an in-person evaluation if the symptoms worsen or if the condition fails to improve as anticipated.  The above assessment and management plan was discussed with the patient. The patient verbalized understanding of and has agreed to the management plan. Patient is aware to call the clinic if  symptoms persist or worsen. Patient is aware when to return to the clinic for a follow-up visit. Patient educated on when it is appropriate to go to the emergency department.   Time call ended:  12:35 I provided 15 minutes of non-face-to-face time during this encounter.    Mary-Margaret Hassell Done, FNP

## 2019-06-15 LAB — NOVEL CORONAVIRUS, NAA: SARS-CoV-2, NAA: NOT DETECTED

## 2019-06-27 ENCOUNTER — Other Ambulatory Visit: Payer: Self-pay

## 2019-06-28 ENCOUNTER — Ambulatory Visit: Payer: Medicare HMO

## 2019-06-30 ENCOUNTER — Other Ambulatory Visit: Payer: Self-pay

## 2019-06-30 ENCOUNTER — Ambulatory Visit (INDEPENDENT_AMBULATORY_CARE_PROVIDER_SITE_OTHER): Payer: Medicare HMO

## 2019-06-30 DIAGNOSIS — D518 Other vitamin B12 deficiency anemias: Secondary | ICD-10-CM

## 2019-06-30 NOTE — Progress Notes (Signed)
Cyanocobalamin injection given to left deltoid.  Patient tolerated well. 

## 2019-07-05 DIAGNOSIS — M79641 Pain in right hand: Secondary | ICD-10-CM | POA: Insufficient documentation

## 2019-07-29 ENCOUNTER — Telehealth: Payer: Self-pay | Admitting: Family Medicine

## 2019-08-01 ENCOUNTER — Other Ambulatory Visit: Payer: Self-pay

## 2019-08-01 ENCOUNTER — Ambulatory Visit (INDEPENDENT_AMBULATORY_CARE_PROVIDER_SITE_OTHER): Payer: Medicare HMO | Admitting: *Deleted

## 2019-08-01 DIAGNOSIS — D518 Other vitamin B12 deficiency anemias: Secondary | ICD-10-CM | POA: Diagnosis not present

## 2019-08-29 ENCOUNTER — Ambulatory Visit: Payer: Medicare HMO

## 2019-09-01 ENCOUNTER — Other Ambulatory Visit: Payer: Self-pay

## 2019-09-01 ENCOUNTER — Ambulatory Visit (INDEPENDENT_AMBULATORY_CARE_PROVIDER_SITE_OTHER): Payer: Medicare HMO | Admitting: *Deleted

## 2019-09-01 DIAGNOSIS — D518 Other vitamin B12 deficiency anemias: Secondary | ICD-10-CM

## 2019-09-01 NOTE — Progress Notes (Signed)
Vitamin b12 injection given and patient tolerated well.  

## 2019-10-03 ENCOUNTER — Ambulatory Visit (INDEPENDENT_AMBULATORY_CARE_PROVIDER_SITE_OTHER): Payer: Medicare HMO

## 2019-10-03 ENCOUNTER — Other Ambulatory Visit: Payer: Self-pay

## 2019-10-03 DIAGNOSIS — D518 Other vitamin B12 deficiency anemias: Secondary | ICD-10-CM | POA: Diagnosis not present

## 2019-10-03 NOTE — Progress Notes (Signed)
Cyanocobalamin injection given to right deltoid.  Patient tolerated well. 

## 2019-10-13 DIAGNOSIS — D225 Melanocytic nevi of trunk: Secondary | ICD-10-CM | POA: Diagnosis not present

## 2019-10-13 DIAGNOSIS — Z1283 Encounter for screening for malignant neoplasm of skin: Secondary | ICD-10-CM | POA: Diagnosis not present

## 2019-10-13 DIAGNOSIS — L304 Erythema intertrigo: Secondary | ICD-10-CM | POA: Diagnosis not present

## 2019-10-13 DIAGNOSIS — L821 Other seborrheic keratosis: Secondary | ICD-10-CM | POA: Diagnosis not present

## 2019-10-27 ENCOUNTER — Other Ambulatory Visit: Payer: Self-pay | Admitting: Family Medicine

## 2019-10-27 ENCOUNTER — Telehealth (INDEPENDENT_AMBULATORY_CARE_PROVIDER_SITE_OTHER): Payer: Medicare HMO | Admitting: Family Medicine

## 2019-10-27 ENCOUNTER — Encounter: Payer: Self-pay | Admitting: Family Medicine

## 2019-10-27 DIAGNOSIS — K219 Gastro-esophageal reflux disease without esophagitis: Secondary | ICD-10-CM

## 2019-10-27 DIAGNOSIS — E782 Mixed hyperlipidemia: Secondary | ICD-10-CM | POA: Diagnosis not present

## 2019-10-27 DIAGNOSIS — G629 Polyneuropathy, unspecified: Secondary | ICD-10-CM | POA: Diagnosis not present

## 2019-10-27 DIAGNOSIS — E1169 Type 2 diabetes mellitus with other specified complication: Secondary | ICD-10-CM | POA: Diagnosis not present

## 2019-10-27 MED ORDER — GABAPENTIN 600 MG PO TABS: 600.0000 mg | ORAL_TABLET | Freq: Two times a day (BID) | ORAL | 3 refills | Status: DC

## 2019-10-27 NOTE — Progress Notes (Signed)
Virtual Visit via Mychart video Note  I connected with [name] on 10/27/19 at 1629 by video and verified that I am speaking with the correct person using two identifiers. [name] is currently located at home and no other people are currently with her during visit. The provider, [name] Charmelle Soh, MD is located in their office at time of visit.  Call ended at 1639  I discussed the limitations, risks, security and privacy concerns of performing an evaluation and management service by video and the availability of in person appointments. I also discussed with the patient that there may be a patient responsible charge related to this service. The patient expressed understanding and agreed to proceed.   History and Present Illness: Type 2 diabetes mellitus Patient comes in today for recheck of his diabetes. Patient has been currently taking no medication except gabapentin. Patient is not currently on an ACE inhibitor/ARB. Patient has not seen an ophthalmologist this year. Patient denies any issues with their feet.   Hyperlipidemia Patient is coming in for recheck of his hyperlipidemia. The patient is currently taking fish oil. They deny any issues with myalgias or history of liver damage from it. They deny any focal numbness or weakness or chest pain.   GERD Patient is currently on pantaprazole.  She denies any major symptoms or abdominal pain or belching or burping. She denies any blood in her stool or lightheadedness or dizziness.   1. Type 2 diabetes mellitus with other specified complication, without long-term current use of insulin ([place])   2. Mixed hyperlipidemia   3. Gastroesophageal reflux disease without esophagitis   4. Neuropathy     Outpatient Encounter Medications as of 10/27/2019  Medication Sig  . albuterol (PROVENTIL) (2.5 MG/3ML) 0.083% nebulizer solution Take 3 mLs (2.5 mg total) by nebulization every 6 (six) hours as needed for wheezing or shortness of  breath.  Marland Kitchen albuterol (VENTOLIN HFA) 108 (90 Base) MCG/ACT inhaler Inhale 2 puffs into the lungs every 6 (six) hours as needed. For shortness of breath  . budesonide-formoterol (SYMBICORT) 160-4.5 MCG/ACT inhaler Take 2 puffs first thing in am and then another 2 puffs about 12 hours later.  . cholecalciferol (VITAMIN D) 1000 units tablet Take 1,000 Units by mouth daily.  . citalopram (CELEXA) 40 MG tablet Take 1 tablet (40 mg total) by mouth daily. TAKE ONE (1) TABLET EACH DAY  . cycloSPORINE (RESTASIS) 0.05 % ophthalmic emulsion 1 drop 2 (two) times daily.  . folic acid (FOLVITE) 053 MCG tablet Take 0.5 tablets (400 mcg total) by mouth daily.  . furosemide (LASIX) 20 MG tablet Take 1-2 tablets (20-40 mg total) by mouth daily. As needed  . gabapentin (NEURONTIN) 600 MG tablet Take 1 tablet (600 mg total) by mouth 2 (two) times daily. Needs to be seen for future refills.  . Melatonin 10 MG TABS Take 1 tablet by mouth at bedtime.  . pantoprazole (PROTONIX) 40 MG tablet TAKE ONE (1) TABLET EACH DAY  . tiotropium (SPIRIVA HANDIHALER) 18 MCG inhalation capsule Place 1 capsule (18 mcg total) into inhaler and inhale daily.  . [DISCONTINUED] azithromycin (ZITHROMAX Z-PAK) 250 MG tablet As directed  . [DISCONTINUED] gabapentin (NEURONTIN) 600 MG tablet Take 1 tablet (600 mg total) by mouth 3 (three) times daily.   Facility-Administered Encounter Medications as of 10/27/2019  Medication  . cyanocobalamin ((VITAMIN B-12)) injection 1,000 mcg    Review of Systems  Constitutional: Negative for chills and fever.  HENT: Negative for congestion, ear  discharge and ear pain.   Eyes: Negative for redness and visual disturbance.  Respiratory: Negative for chest tightness and shortness of breath.   Cardiovascular: Negative for chest pain and leg swelling.  Genitourinary: Negative for difficulty urinating and dysuria.  Musculoskeletal: Negative for back pain and gait problem.  Skin: Negative for rash.    Neurological: Positive for numbness. Negative for light-headedness and headaches.  Psychiatric/Behavioral: Negative for agitation and behavioral problems.  All other systems reviewed and are negative.   Observations/Objective: Patient sounds and looks comfortable and in no acute distress  Assessment and Plan: Problem List Items Addressed This Visit      Digestive   Gastroesophageal reflux disease without esophagitis   Relevant Orders   CBC with Differential/Platelet     Endocrine   Type 2 diabetes mellitus with other specified complication ([place]) - Primary   Relevant Orders   Bayer DCA Hb A1c Waived   CMP14+EGFR     Other   Mixed hyperlipidemia   Relevant Orders   CBC with Differential/Platelet   Lipid panel    Other Visit Diagnoses    Neuropathy       Relevant Medications   gabapentin (NEURONTIN) 600 MG tablet      Continue current medication and watch for blood work Follow up plan: Return in about 3 months (around 01/27/2020), or if symptoms worsen or fail to improve, for diabetes and hld and gerd.     I discussed the assessment and treatment plan with the patient. The patient was provided an opportunity to ask questions and all were answered. The patient agreed with the plan and demonstrated an understanding of the instructions.   The patient was advised to call back or seek an in-person evaluation if the symptoms worsen or if the condition fails to improve as anticipated.  The above assessment and management plan was discussed with the patient. The patient verbalized understanding of and has agreed to the management plan. Patient is aware to call the clinic if symptoms persist or worsen. Patient is aware when to return to the clinic for a follow-up visit. Patient educated on when it is appropriate to go to the emergency department.    I provided 10 minutes of non-face-to-face time during this encounter.    Worthy Rancher, MD

## 2019-10-31 ENCOUNTER — Telehealth: Payer: Self-pay | Admitting: Family Medicine

## 2019-10-31 NOTE — Chronic Care Management (AMB) (Signed)
  Care Management   Note  10/31/2019 Name: [name] MRN: [id number] DOB: 02-27-1956  [name] is a 64 y.o. year old female who is a primary care patient of [name], [name], MD and is actively engaged with the care management team. I reached out to [name] by phone today to assist with scheduling a follow up visit with the RN Case Manager  Follow up plan: Telephone appointment with care management team member scheduled for:11/22/2019  Noreene Larsson, [place], [place], [place] [address] Direct Dial: [phone] [email or web address] Website: [email or web address]

## 2019-11-01 ENCOUNTER — Other Ambulatory Visit: Payer: Self-pay

## 2019-11-01 ENCOUNTER — Ambulatory Visit (INDEPENDENT_AMBULATORY_CARE_PROVIDER_SITE_OTHER): Payer: Medicare HMO | Admitting: *Deleted

## 2019-11-01 DIAGNOSIS — E782 Mixed hyperlipidemia: Secondary | ICD-10-CM | POA: Diagnosis not present

## 2019-11-01 DIAGNOSIS — K219 Gastro-esophageal reflux disease without esophagitis: Secondary | ICD-10-CM

## 2019-11-01 DIAGNOSIS — E1169 Type 2 diabetes mellitus with other specified complication: Secondary | ICD-10-CM | POA: Diagnosis not present

## 2019-11-01 DIAGNOSIS — D518 Other vitamin B12 deficiency anemias: Secondary | ICD-10-CM | POA: Diagnosis not present

## 2019-11-01 LAB — BAYER DCA HB A1C WAIVED: HB A1C (BAYER DCA - WAIVED): 6.9 % (ref <7.0)

## 2019-11-01 NOTE — Progress Notes (Signed)
Pt given B12 injection IM left deltoid and tolerated well. °

## 2019-11-01 NOTE — Patient Instructions (Signed)

## 2019-11-02 LAB — CBC WITH DIFFERENTIAL/PLATELET
Basophils Absolute: 0.1 10*3/uL (ref 0.0–0.2)
Basos: 1 %
EOS (ABSOLUTE): 0.1 10*3/uL (ref 0.0–0.4)
Eos: 1 %
Hematocrit: 38.9 % (ref 34.0–46.6)
Hemoglobin: 12.8 g/dL (ref 11.1–15.9)
Immature Grans (Abs): 0 10*3/uL (ref 0.0–0.1)
Immature Granulocytes: 0 %
Lymphocytes Absolute: 3 10*3/uL (ref 0.7–3.1)
Lymphs: 26 %
MCH: 28.6 pg (ref 26.6–33.0)
MCHC: 32.9 g/dL (ref 31.5–35.7)
MCV: 87 fL (ref 79–97)
Monocytes Absolute: 0.6 10*3/uL (ref 0.1–0.9)
Monocytes: 5 %
Neutrophils Absolute: 7.5 10*3/uL — ABNORMAL HIGH (ref 1.4–7.0)
Neutrophils: 67 %
Platelets: 299 10*3/uL (ref 150–450)
RBC: 4.48 x10E6/uL (ref 3.77–5.28)
RDW: 13.2 % (ref 11.7–15.4)
WBC: 11.3 10*3/uL — ABNORMAL HIGH (ref 3.4–10.8)

## 2019-11-02 LAB — LIPID PANEL
Chol/HDL Ratio: 4.7 ratio — ABNORMAL HIGH (ref 0.0–4.4)
Cholesterol, Total: 222 mg/dL — ABNORMAL HIGH (ref 100–199)
HDL: 47 mg/dL (ref >39)
LDL Chol Calc (NIH): 147 mg/dL — ABNORMAL HIGH (ref 0–99)
Triglycerides: 157 mg/dL — ABNORMAL HIGH (ref 0–149)
VLDL Cholesterol Cal: 28 mg/dL (ref 5–40)

## 2019-11-02 LAB — CMP14+EGFR
ALT: 12 IU/L (ref 0–32)
AST: 13 IU/L (ref 0–40)
Albumin/Globulin Ratio: 1.4 (ref 1.2–2.2)
Albumin: 3.7 g/dL — ABNORMAL LOW (ref 3.8–4.8)
Alkaline Phosphatase: 91 IU/L (ref 39–117)
BUN/Creatinine Ratio: 11 — ABNORMAL LOW (ref 12–28)
BUN: 10 mg/dL (ref 8–27)
Bilirubin Total: 0.3 mg/dL (ref 0.0–1.2)
CO2: 27 mmol/L (ref 20–29)
Calcium: 8.8 mg/dL (ref 8.7–10.3)
Chloride: 101 mmol/L (ref 96–106)
Creatinine, Ser: 0.9 mg/dL (ref 0.57–1.00)
GFR calc Af Amer: 80 mL/min/{1.73_m2} (ref >59)
GFR calc non Af Amer: 69 mL/min/{1.73_m2} (ref >59)
Globulin, Total: 2.7 g/dL (ref 1.5–4.5)
Glucose: 134 mg/dL — ABNORMAL HIGH (ref 65–99)
Potassium: 4 mmol/L (ref 3.5–5.2)
Sodium: 143 mmol/L (ref 134–144)
Total Protein: 6.4 g/dL (ref 6.0–8.5)

## 2019-11-03 ENCOUNTER — Telehealth: Payer: Self-pay | Admitting: *Deleted

## 2019-11-03 ENCOUNTER — Other Ambulatory Visit: Payer: Self-pay

## 2019-11-03 MED ORDER — LIVALO 2 MG PO TABS: 1.0000 | ORAL_TABLET | Freq: Every day | ORAL | 1 refills | Status: DC

## 2019-11-03 NOTE — Progress Notes (Signed)
Rx sent per [name].

## 2019-11-03 NOTE — Telephone Encounter (Signed)
Prior Auth for Livalo 2mg - IN Process  Key: [id number]   PA Case ID: [id number]   Your information has been submitted to [facility]. [employer] will review the request and will issue a decision, typically within 3-7 days from your submission. You can check the updated outcome later by reopening this request.  If [employer] has not responded in 3-7 days or if you have any questions about your ePA request, please contact [employer] at [phone]. If you think there may be a problem with your PA request, use our live chat feature at the bottom right.

## 2019-11-04 NOTE — Telephone Encounter (Signed)
Okay let the patient know and we will discuss it at the next visit but I think she is already tried a lot of the other medications but I do not know she tried Zetia.

## 2019-11-04 NOTE — Telephone Encounter (Signed)
Patient aware and she states that she does not believe she has not tried zetia. Patient will discuss at next visit.

## 2019-11-04 NOTE — Telephone Encounter (Signed)
Prior Auth for Livalo 2mg -DENIED  Must try and fail or have contraindication to all preferred drugs which are:  ezetimibe tablet, rosuvastatin tablet, and simvastatin tablet,

## 2019-11-08 ENCOUNTER — Telehealth: Payer: Self-pay | Admitting: Family Medicine

## 2019-11-08 NOTE — Telephone Encounter (Signed)
Patient is at the pharmacy wanting to pick up a new medication for cholesterol. [facility] in [place] has not received anything. Livalo is not covered. What medication did you want to switch to?

## 2019-11-09 ENCOUNTER — Telehealth: Payer: Self-pay | Admitting: Family Medicine

## 2019-11-09 MED ORDER — ROSUVASTATIN CALCIUM 5 MG PO TABS: 5.0000 mg | ORAL_TABLET | Freq: Every day | ORAL | 3 refills | Status: DC

## 2019-11-09 NOTE — Telephone Encounter (Signed)
The drug store aware [name] called patient

## 2019-11-09 NOTE — Chronic Care Management (AMB) (Signed)
  Care Management   Note  11/09/2019 Name: [name] MRN: [id number] DOB: [birth year]  [name] is a 64 y.o. year old female who is a primary care patient of [name], [name], MD and is actively engaged with the care management team. I reached out to [name] by phone today to assist with re-scheduling a follow up visit with the RN Case Manager  Follow up plan: Telephone appointment with care management team member scheduled for:11/28/2019.  [place], [place] [address] Direct Dial: [phone] [email or web address]: [email or web address]

## 2019-11-09 NOTE — Telephone Encounter (Signed)
I sent Crestor for her, I do not know if she never tried that one before.

## 2019-11-12 ENCOUNTER — Other Ambulatory Visit: Payer: Self-pay | Admitting: Family Medicine

## 2019-11-12 DIAGNOSIS — K219 Gastro-esophageal reflux disease without esophagitis: Secondary | ICD-10-CM

## 2019-11-22 ENCOUNTER — Telehealth: Payer: Medicare HMO

## 2019-11-28 ENCOUNTER — Other Ambulatory Visit: Payer: Self-pay | Admitting: Family Medicine

## 2019-11-28 ENCOUNTER — Telehealth: Payer: Self-pay | Admitting: Family Medicine

## 2019-11-28 ENCOUNTER — Telehealth: Payer: Medicare HMO

## 2019-11-28 DIAGNOSIS — Z1231 Encounter for screening mammogram for malignant neoplasm of breast: Secondary | ICD-10-CM

## 2019-11-28 NOTE — Chronic Care Management (AMB) (Signed)
  Care Management   Note  11/28/2019 Name: NOBUKO PROTO MRN: [id number] DOB: 25-May-1956  GUY CANET is a 64 y.o. year old female who is a primary care patient of [name], [name], MD and is actively engaged with the care management team. I reached out to [name] by phone today to assist with re-scheduling a follow up visit with the RN Case Manager.  Follow up plan: Telephone appointment with care management team member scheduled for: 12/16/2019.  [place], [place] [address] Direct Dial: [phone] [email or web address]: [email or web address]

## 2019-12-02 ENCOUNTER — Ambulatory Visit (INDEPENDENT_AMBULATORY_CARE_PROVIDER_SITE_OTHER): Payer: Medicare HMO | Admitting: Family Medicine

## 2019-12-02 ENCOUNTER — Encounter: Payer: Self-pay | Admitting: Family

## 2019-12-02 ENCOUNTER — Ambulatory Visit (INDEPENDENT_AMBULATORY_CARE_PROVIDER_SITE_OTHER): Payer: Medicare HMO | Admitting: Family

## 2019-12-02 ENCOUNTER — Other Ambulatory Visit: Payer: Self-pay

## 2019-12-02 DIAGNOSIS — E1169 Type 2 diabetes mellitus with other specified complication: Secondary | ICD-10-CM

## 2019-12-02 DIAGNOSIS — R609 Edema, unspecified: Secondary | ICD-10-CM

## 2019-12-02 DIAGNOSIS — D518 Other vitamin B12 deficiency anemias: Secondary | ICD-10-CM

## 2019-12-02 DIAGNOSIS — F17210 Nicotine dependence, cigarettes, uncomplicated: Secondary | ICD-10-CM | POA: Diagnosis not present

## 2019-12-02 DIAGNOSIS — E782 Mixed hyperlipidemia: Secondary | ICD-10-CM | POA: Diagnosis not present

## 2019-12-02 DIAGNOSIS — J449 Chronic obstructive pulmonary disease, unspecified: Secondary | ICD-10-CM | POA: Diagnosis not present

## 2019-12-02 LAB — BMP8+EGFR
BUN/Creatinine Ratio: 12 (ref 12–28)
BUN: 9 mg/dL (ref 8–27)
CO2: 27 mmol/L (ref 20–29)
Calcium: 8.9 mg/dL (ref 8.7–10.3)
Chloride: 102 mmol/L (ref 96–106)
Creatinine, Ser: 0.75 mg/dL (ref 0.57–1.00)
GFR calc Af Amer: 99 mL/min/{1.73_m2} (ref >59)
GFR calc non Af Amer: 86 mL/min/{1.73_m2} (ref >59)
Glucose: 150 mg/dL — ABNORMAL HIGH (ref 65–99)
Potassium: 4 mmol/L (ref 3.5–5.2)
Sodium: 142 mmol/L (ref 134–144)

## 2019-12-02 MED ORDER — FUROSEMIDE 20 MG PO TABS: 20.0000 mg | ORAL_TABLET | Freq: Every day | ORAL | 6 refills | Status: DC

## 2019-12-02 NOTE — Progress Notes (Signed)
Patient given B12 tolerated well

## 2019-12-02 NOTE — Patient Instructions (Signed)
Heart Failure, Diagnosis  Heart failure is a condition in which the heart has trouble pumping blood because it has become weak or stiff. This means that the heart does not pump blood well enough for the body to stay healthy. For some people with heart failure, fluid may back up into the lungs. There may also be swelling (edema) in the lower legs. Heart failure is usually a long-term (chronic) condition. It is important for you to take good care of yourself and follow the treatment plan from your health care provider. What are the causes? This condition may be caused by:  High blood pressure (hypertension). Hypertension causes the heart muscle to work harder than normal. This makes the heart stiff or weak.  Coronary artery disease, or CAD. CAD is the buildup of cholesterol and fat (plaque) in the arteries of the heart.  Heart attack, also called myocardial infarction. This injures the heart muscle, making it hard for the heart to pump blood.  Abnormal heart valves. The valves do not open and close properly, forcing the heart to pump harder to keep the blood flowing.  Heart muscle disease (cardiomyopathy or myocarditis). This is damage to the heart muscle. It can increase the risk of heart failure.  Lung disease. The heart works harder when the lungs are not healthy.  Abnormal heart rhythms. These can lead to heart failure. What increases the risk? The risk of heart failure increases as a person ages. This condition is also more likely to develop in people who:  Are overweight.  Are female.  Smoke or chew tobacco.  Abuse alcohol or illegal drugs.  Have taken medicines that can damage the heart, such as chemotherapy drugs.  Have diabetes.  Have abnormal heart rhythms.  Have thyroid problems.  Have low blood counts (anemia). What are the signs or symptoms? Symptoms of this condition include:  Shortness of breath with activity, such as when climbing stairs.  A cough that does not  go away.  Swelling of the feet, ankles, legs, or abdomen.  Losing weight for no reason.  Trouble breathing when lying flat (orthopnea).  Waking from sleep because of the need to sit up and get more air.  Rapid heartbeat.  Tiredness (fatigue) and loss of energy.  Feeling light-headed, dizzy, or close to fainting.  Loss of appetite.  Nausea.  Waking up more often during the night to urinate (nocturia).  Confusion. How is this diagnosed? This condition is diagnosed based on:  Your medical history, symptoms, and a physical exam.  Diagnostic tests, which may include: ? Echocardiogram. ? Electrocardiogram (ECG). ? Chest X-ray. ? Blood tests. ? Exercise stress test. ? Radionuclide scans. ? Cardiac catheterization and angiogram. How is this treated? Treatment for this condition is aimed at managing the symptoms of heart failure. Medicines Treatment may include medicines that:  Help lower blood pressure by relaxing (dilating) the blood vessels. These medicines are called ACE inhibitors (angiotensin-converting enzyme) and ARBs (angiotensin receptor blockers).  Cause the kidneys to remove salt and water from the blood through urination (diuretics).  Improve heart muscle strength and prevent the heart from beating too fast (beta blockers).  Increase the force of the heartbeat (digoxin). Healthy behavior changes     Treatment may also include making healthy lifestyle changes, such as:  Reaching and staying at a healthy weight.  Quitting smoking or chewing tobacco.  Eating heart-healthy foods.  Limiting or avoiding alcohol.  Stopping the use of illegal drugs.  Being physically active.  Other treatments   Other treatments may include:  Procedures to open blocked arteries or repair damaged valves.  Placing a pacemaker to improve heart function (cardiac resynchronization therapy).  Placing a device to treat serious abnormal heart rhythms (implantable cardioverter  defibrillator, or ICD).  Placing a device to improve the pumping ability of the heart (left ventricular assist device, or LVAD).  Receiving a healthy heart from a donor (heart transplant). This is done when other treatments have not helped. Follow these instructions at home:  Manage other health conditions as told by your health care provider. These may include hypertension, diabetes, thyroid disease, or abnormal heart rhythms.  Get ongoing education and support as needed. Learn as much as you can about heart failure.  Keep all follow-up visits as told by your health care provider. This is important. Summary  Heart failure is a condition in which the heart has trouble pumping blood because it has become weak or stiff.  This condition is caused by high blood pressure and other diseases of the heart and lungs.  Symptoms of this condition include shortness of breath, tiredness (fatigue), nausea, and swelling of the feet, ankles, legs, or abdomen.  Treatments for this condition may include medicines, lifestyle changes, and surgery.  Manage other health conditions as told by your health care provider. This information is not intended to replace advice given to you by your health care provider. Make sure you discuss any questions you have with your health care provider. Document Revised: 10/15/2018 Document Reviewed: 10/15/2018 Elsevier Patient Education  2020 [employer]  

## 2019-12-02 NOTE — Progress Notes (Signed)
Subjective:    Patient ID: [name], female    DOB: [birth year], 64 y.o.   MRN: [id number]  Chief Complaint  Patient presents with  . Leg Swelling  . Bloated    started 3 days ago    HPI Pt presents to the office today with bilateral leg swelling that is worse in her left leg over the last week. She reports she started taking her Lasix 20 mg 4 days ago and her swelling has improved. She reports she gained 10 lbs over the last week. Unfortunately, she only weighs weekly and we do not have weight before she started the lasix.   She is complaining of bloating and fluid retention. She reports she could not "button my pants".    She has a hx of DM, hyperlipidemia, COPD, and current smoker. She reports smoking about 1/2 pack a day. .   Denies any leg pain, redness, or tenderness. She does report SOB, but has COPD and states she is unsure if she has any new SOB.   Review of Systems  Respiratory: Positive for shortness of breath and wheezing.   Cardiovascular: Positive for leg swelling.  All other systems reviewed and are negative.      Objective:   Physical Exam Vitals reviewed.  Constitutional:      General: She is not in acute distress.    Appearance: She is well-developed.  HENT:     Head: Normocephalic and atraumatic.     Right Ear: Tympanic membrane normal.     Left Ear: Tympanic membrane normal.  Eyes:     Pupils: Pupils are equal, round, and reactive to light.  Neck:     Thyroid: No thyromegaly.  Cardiovascular:     Rate and Rhythm: Normal rate and regular rhythm.     Heart sounds: Normal heart sounds. No murmur.  Pulmonary:     Effort: Pulmonary effort is normal. No respiratory distress.     Breath sounds: Decreased breath sounds present. No wheezing.  Abdominal:     General: Bowel sounds are normal. There is no distension.     Palpations: Abdomen is soft.     Tenderness: There is no abdominal tenderness.  Musculoskeletal:        General: No tenderness.  Normal range of motion.     Cervical back: Normal range of motion and neck supple.     Right lower leg: Edema (2+) present.     Left lower leg: Edema (2+) present.  Skin:    General: Skin is warm and dry.  Neurological:     Mental Status: She is alert and oriented to person, place, and time.     Cranial Nerves: No cranial nerve deficit.     Deep Tendon Reflexes: Reflexes are normal and symmetric.  Psychiatric:        Behavior: Behavior normal.        Thought Content: Thought content normal.        Judgment: Judgment normal.       BP (!) 114/52   Pulse 69   Temp 98.1 F (36.7 C) (Temporal)   Ht 5' 10"  (1.778 m)   Wt 255 lb 9.6 oz (115.9 kg)   SpO2 99%   BMI 36.67 kg/m      Assessment & Plan:  [name] comes in today with chief complaint of Leg Swelling and Bloated (started 3 days ago)   Diagnosis and orders addressed:  1. COPD with chronic bronchitis ([place]) - Brain  natriuretic peptide - BMP8+EGFR - Ambulatory referral to Cardiology  2. Type 2 diabetes mellitus with other specified complication, without long-term current use of insulin ([facility]) - Brain natriuretic peptide - BMP8+EGFR - Ambulatory referral to Cardiology  3. Cigarette smoker - Brain natriuretic peptide - BMP8+EGFR - Ambulatory referral to Cardiology  4. Mixed hyperlipidemia - Brain natriuretic peptide - BMP8+EGFR - Ambulatory referral to Cardiology  5. Morbid obesity ([place]) - Brain natriuretic peptide - BMP8+EGFR - Ambulatory referral to Cardiology  6. Peripheral edema - Brain natriuretic peptide - BMP8+EGFR - Ambulatory referral to Cardiology - furosemide (LASIX) 20 MG tablet; Take 1-2 tablets (20-40 mg total) by mouth daily. As needed  Dispense: 60 tablet; Refill: 6   Labs pending Will do referral to Cardiologists, she will need Echo Strict low salt diet Discussed importance of quit smoking Compression hose Encouraged weight loss   Keep follow up with PCP  Michelle Dun,  FNP

## 2019-12-03 LAB — BRAIN NATRIURETIC PEPTIDE: BNP: 27.6 pg/mL (ref 0.0–100.0)

## 2019-12-12 ENCOUNTER — Other Ambulatory Visit: Payer: Self-pay

## 2019-12-12 ENCOUNTER — Telehealth: Payer: Self-pay | Admitting: Family Medicine

## 2019-12-12 ENCOUNTER — Ambulatory Visit (INDEPENDENT_AMBULATORY_CARE_PROVIDER_SITE_OTHER): Payer: Medicare HMO | Admitting: Nurse Practitioner

## 2019-12-12 ENCOUNTER — Encounter: Payer: Self-pay | Admitting: Nurse Practitioner

## 2019-12-12 ENCOUNTER — Ambulatory Visit (HOSPITAL_COMMUNITY)
Admission: RE | Admit: 2019-12-12 | Discharge: 2019-12-12 | Disposition: A | Discharge status: Home / Self Care | Payer: Medicare HMO | Source: Ambulatory Visit | Attending: Neurosurgery | Admitting: Neurosurgery

## 2019-12-12 VITALS — BP 135/64 | HR 71 | Temp 98.4°F | Resp 20 | Ht 70.0 in | Wt 250.0 lb

## 2019-12-12 DIAGNOSIS — M79662 Pain in left lower leg: Secondary | ICD-10-CM | POA: Insufficient documentation

## 2019-12-12 DIAGNOSIS — S8012XA Contusion of left lower leg, initial encounter: Secondary | ICD-10-CM | POA: Diagnosis not present

## 2019-12-12 DIAGNOSIS — G35 Multiple sclerosis: Secondary | ICD-10-CM | POA: Diagnosis not present

## 2019-12-12 NOTE — Telephone Encounter (Signed)
Patient was seen today and sent to have a [place].  Please review and advise

## 2019-12-12 NOTE — Telephone Encounter (Signed)
Pt called requesting that someone call her back to go over her results from her [place] she had done today.

## 2019-12-12 NOTE — Assessment & Plan Note (Addendum)
  Patients left calf pain in not well controlled, this is a new problem for patient. I provided education for possible DVT, ultrasound ordered, if imaging comes back positive treatment will follow appropriately, but if  negative, compression socks and antiinflammatory will provide relief.  Patient knows to follow up with unresolved symptoms.  Patients ultrasound came back negative. Patient has been called and notified, educated to start ibuprofen 600 mg and compression socks.

## 2019-12-12 NOTE — Patient Instructions (Addendum)
Patients left calf pain in not well controlled, this is a new problem for patient. I provided education for possible DVT, ultrasound ordered, if imaging comes back positive treatment will follow appropriately, but if  negative, compression socks and antiinflammatory will provide relief.  Patient knows to follow up with unresolved symptoms.   Deep Vein Thrombosis  Deep vein thrombosis (DVT) is a condition in which a blood clot forms in a deep vein, such as a lower leg, thigh, or arm vein. A clot is blood that has thickened into a gel or solid. This condition is dangerous. It can lead to serious and even life-threatening complications if the clot travels to the lungs and causes a blockage (pulmonary embolism). It can also damage veins in the leg. This can result in leg pain, swelling, discoloration, and sores (post-thrombotic syndrome). What are the causes? This condition may be caused by:  A slowdown of blood flow.  Damage to a vein.  A condition that causes blood to clot more easily, such as an inherited clotting disorder. What increases the risk? The following factors may make you more likely to develop this condition:  Being overweight.  Being older, especially over age 54.  Sitting or lying down for more than four hours.  Being in the hospital.  Lack of physical activity (sedentary lifestyle).  Pregnancy, being in childbirth, or having recently given birth.  Taking medicines that contain estrogen, such as medicines to prevent pregnancy.  Smoking.  A history of any of the following: ? Blood clots or a blood clotting disease. ? Peripheral vascular disease. ? Inflammatory bowel disease. ? Cancer. ? Heart disease. ? Genetic conditions that affect how your blood clots, such as Factor V Leiden mutation. ? Neurological diseases that affect your legs (leg paresis). ? A recent injury, such as a car accident. ? Major or lengthy surgery. ? A central line placed inside a large  vein. What are the signs or symptoms? Symptoms of this condition include:  Swelling, pain, or tenderness in an arm or leg.  Warmth, redness, or discoloration in an arm or leg. If the clot is in your leg, symptoms may be more noticeable or worse when you stand or walk. Some people may not develop any symptoms. How is this diagnosed? This condition is diagnosed with:  A medical history and physical exam.  Tests, such as: ? Blood tests. These are done to check how well your blood clots. ? Ultrasound. This is done to check for clots. ? Venogram. For this test, contrast dye is injected into a vein and X-rays are taken to check for any clots. How is this treated? Treatment for this condition depends on:  The cause of your DVT.  Your risk for bleeding or developing more clots.  Any other medical conditions that you have. Treatment may include:  Taking a blood thinner (anticoagulant). This type of medicine prevents clots from forming. It may be taken by mouth, injected under the skin, or injected through an IV (catheter).  Injecting clot-dissolving medicines into the affected vein (catheter-directed thrombolysis).  Having surgery. Surgery may be done to: ? Remove the clot. ? Place a filter in a large vein to catch blood clots before they reach the lungs. Some treatments may be continued for up to six months. Follow these instructions at home: If you are taking blood thinners:  Take the medicine exactly as told by your health care provider. Some blood thinners need to be taken at the same time every day. Do  not skip a dose.  Talk with your health care provider before you take any medicines that contain aspirin or NSAIDs. These medicines increase your risk for dangerous bleeding.  Ask your health care provider about foods and drugs that could change the way the medicine works (may interact). Avoid those things if your health care provider tells you to do so.  Blood thinners can  cause easy bruising and may make it difficult to stop bleeding. Because of this: ? Be very careful when using knives, scissors, or other sharp objects. ? Use an electric razor instead of a blade. ? Avoid activities that could cause injury or bruising, and follow instructions about how to prevent falls.  Wear a medical alert bracelet or carry a card that lists what medicines you take. General instructions  Take over-the-counter and prescription medicines only as told by your health care provider.  Return to your normal activities as told by your health care provider. Ask your health care provider what activities are safe for you.  Wear compression stockings if recommended by your health care provider.  Keep all follow-up visits as told by your health care provider. This is important. How is this prevented? To lower your risk of developing this condition again:  For 30 or more minutes every day, do an activity that: ? Involves moving your arms and legs. ? Increases your heart rate.  When traveling for longer than four hours: ? Exercise your arms and legs every hour. ? Drink plenty of water. ? Avoid drinking alcohol.  Avoid sitting or lying for a long time without moving your legs.  If you have surgery or you are hospitalized, ask about ways to prevent blood clots. These may include taking frequent walks or using anticoagulants.  Stay at a healthy weight.  If you are a woman who is older than age 39, avoid unnecessary use of medicines that contain estrogen, such as some birth control pills.  Do not use any products that contain nicotine or tobacco, such as cigarettes and e-cigarettes. This is especially important if you take estrogen medicines. If you need help quitting, ask your health care provider. Contact a health care provider if:  You miss a dose of your blood thinner.  Your menstrual period is heavier than usual.  You have unusual bruising. Get help right away if:   You have: ? New or increased pain, swelling, or redness in an arm or leg. ? Numbness or tingling in an arm or leg. ? Shortness of breath. ? Chest pain. ? A rapid or irregular heartbeat. ? A severe headache or confusion. ? A cut that will not stop bleeding.  There is blood in your vomit, stool, or urine.  You have a serious fall or accident, or you hit your head.  You feel light-headed or dizzy.  You cough up blood. These symptoms may represent a serious problem that is an emergency. Do not wait to see if the symptoms will go away. Get medical help right away. Call your local emergency services (911 in the [place] Do not drive yourself to the hospital. Summary  Deep vein thrombosis (DVT) is a condition in which a blood clot forms in a deep vein, such as a lower leg, thigh, or arm vein.  Symptoms can include swelling, warmth, pain, and redness in your leg or arm.  This condition may be treated with a blood thinner (anticoagulant medicine), medicine that is injected to dissolve blood clots,compression stockings, or surgery.  If you are  prescribed blood thinners, take them exactly as told. This information is not intended to replace advice given to you by your health care provider. Make sure you discuss any questions you have with your health care provider. Document Revised: 07/10/2017 Document Reviewed: 12/26/2016 Elsevier Patient Education  [employer].  Smoking Tobacco Information, Adult Smoking tobacco can be harmful to your health. Tobacco contains a poisonous (toxic), colorless chemical called nicotine. Nicotine is addictive. It changes the brain and can make it hard to stop smoking. Tobacco also has other toxic chemicals that can hurt your body and raise your risk of many cancers. How can smoking tobacco affect me? Smoking tobacco puts you at risk for: Cancer. Smoking is most commonly associated with lung cancer, but can also lead to cancer in other parts of the body.  Chronic obstructive pulmonary disease (COPD). This is a long-term lung condition that makes it hard to breathe. It also gets worse over time. High blood pressure (hypertension), heart disease, stroke, or heart attack. Lung infections, such as pneumonia. Cataracts. This is when the lenses in the eyes become clouded. Digestive problems. This may include peptic ulcers, heartburn, and gastroesophageal reflux disease (GERD). Oral health problems, such as gum disease and tooth loss. Loss of taste and smell. Smoking can affect your appearance by causing: Wrinkles. Yellow or stained teeth, fingers, and fingernails. Smoking tobacco can also affect your social life, because: It may be challenging to find places to smoke when away from home. Many workplaces, [employer], hotels, and public places are tobacco-free. Smoking is expensive. This is due to the cost of tobacco and the long-term costs of treating health problems from smoking. Secondhand smoke may affect those around you. Secondhand smoke can cause lung cancer, breathing problems, and heart disease. Children of smokers have a higher risk for: Sudden infant death syndrome (SIDS). Ear infections. Lung infections. If you currently smoke tobacco, quitting now can help you: Lead a longer and healthier life. Look, smell, breathe, and feel better over time. Save money. Protect others from the harms of secondhand smoke. What actions can I take to prevent health problems? Quit smoking  Do not start smoking. Quit if you already do. Make a plan to quit smoking and commit to it. Look for programs to help you and ask your health care provider for recommendations and ideas. Set a date and write down all the reasons you want to quit. Let your friends and family know you are quitting so they can help and support you. Consider finding friends who also want to quit. It can be easier to quit with someone else, so that you can support each other. Talk with  your health care provider about using nicotine replacement medicines to help you quit, such as gum, lozenges, patches, sprays, or pills. Do not replace cigarette smoking with electronic cigarettes, which are commonly called e-cigarettes. The safety of e-cigarettes is not known, and some may contain harmful chemicals. If you try to quit but return to smoking, stay positive. It is common to slip up when you first quit, so take it one day at a time. Be prepared for cravings. When you feel the urge to smoke, chew gum or suck on hard candy. Lifestyle Stay busy and take care of your body. Drink enough fluid to keep your urine pale yellow. Get plenty of exercise and eat a healthy diet. This can help prevent weight gain after quitting. Monitor your eating habits. Quitting smoking can cause you to have a larger appetite than  when you smoke. Find ways to relax. Go out with friends or family to a movie or a restaurant where people do not smoke. Ask your health care provider about having regular tests (screenings) to check for cancer. This may include blood tests, imaging tests, and other tests. Find ways to manage your stress, such as meditation, yoga, or exercise. Where to find support To get support to quit smoking, consider: Asking your health care provider for more information and resources. Taking classes to learn more about quitting smoking. Looking for local organizations that offer resources about quitting smoking. Joining a support group for people who want to quit smoking in your local community. Calling the smokefree.gov counselor helpline: 1-800-Quit-Now 367-337-1377) Where to find more information You may find more information about quitting smoking from: HelpGuide.org: www.helpguide.org https://hall.com/: smokefree.gov American Lung Association: www.lung.org Contact a health care provider if you: Have problems breathing. Notice that your lips, nose, or fingers turn blue. Have chest pain.  Are coughing up blood. Feel faint or you pass out. Have other health changes that cause you to worry. Summary Smoking tobacco can negatively affect your health, the health of those around you, your finances, and your social life. Do not start smoking. Quit if you already do. If you need help quitting, ask your health care provider. Think about joining a support group for people who want to quit smoking in your local community. There are many effective programs that will help you to quit this behavior. This information is not intended to replace advice given to you by your health care provider. Make sure you discuss any questions you have with your health care provider. Document Revised: 04/22/2019 Document Reviewed: 08/12/2016 Elsevier Patient Education  2020 [employer].

## 2019-12-12 NOTE — Progress Notes (Signed)
Established Patient Office Visit  Subjective:  Patient ID: [name], female    DOB: Jun 15, 1956  Age: 64 y.o. MRN: [id number]  CC:  Chief Complaint  Patient presents with  . stinging sensation left calf area  . Edema    bilateral lower extremities    HPI [name] presents for left calf pain that started yesterday. She dscribes pain as a burning sensation that followed a bee like sting. She is rating her pain as a 7 out of 10 on the pain scale. The area in question is not itchy or warm to touch but slightly discolored. Lately patient has experienced increase tiredness, and has had one episode of dizziness in the last 2 days. Patient is concerned due to past medical family history and  is not ready to quit smoking at this time due to past failed attempts.  Past Medical History:  Diagnosis Date  . Allergy    seasonal   . Anxiety   . Arthritis   . Cancer ([place])    SKIN / "BORDERLINE CERVICAL CANCER"  . COPD (chronic obstructive pulmonary disease) ([place])   . GERD (gastroesophageal reflux disease)   . History of skin cancer   . Mixed hyperlipidemia   . Multiple sclerosis ([place])   . Neuromuscular disorder ([facility])    MIGRAINES  . Neuropathy    FEET  . PONV (postoperative nausea and vomiting)   . Pre-diabetes   . Psoriasis   . Weakness of left side of body    DUE TO MS    Past Surgical History:  Procedure Laterality Date  . ABDOMINAL HYSTERECTOMY    . CHOLECYSTECTOMY    . COLONOSCOPY N/A 08/02/2015   Procedure: COLONOSCOPY;  Surgeon: Rogene Houston, MD;  [facility];  Service: Endoscopy;  Laterality: N/A;  12:00-moved to 1220 Ann to notify pt  . KNEE ARTHROSCOPY     RT  . LAPAROSCOPIC APPENDECTOMY N/A 02/12/2017   Procedure: APPENDECTOMY LAPAROSCOPIC;  Surgeon: Aviva Signs, MD;  Location: [facility];  Service: General;  Laterality: N/A;  . Left breast lumpectomy    . Left carpal tunnel release    . PARTIAL KNEE ARTHROPLASTY Left 01/22/2015   Procedure:  LEFT KNEE MEDIAL UNICOMPARTMENTAL KNEE;  Surgeon: Gaynelle Arabian, MD;  [facility]: [facility];  Service: Orthopedics;  Laterality: Left;  . Right total knee replacement  2010  . SHOULDER SURGERY Left   . Sinus cyst removal    . TOTAL KNEE REVISION  05/05/2012   Procedure: TOTAL KNEE REVISION;  Surgeon: Gearlean Alf, MD;  [facility];  Service: Orthopedics;  Laterality: Right;  . WRIST SURGERY     rt    Family History  Problem Relation Age of Onset  . Dementia Mother   . Thyroid disease Mother        goiter   . Diabetes Mother   . Atrial fibrillation Father        Died with pulmonary embolus  . Emphysema Father   . Heart disease Father   . Other Father        blood clots   . Celiac disease Father   . GI problems Father        dumping syndrome - malabsorbtion   . Cancer - Colon Maternal Grandmother   . Dementia Maternal Grandmother   . Heart disease Maternal Grandfather   . Stroke Maternal Grandfather   . Other Sister        hereditary = blood  clots   . Parkinson's disease Brother   . Alcohol abuse Brother   . Arthritis Son   . Heart attack Paternal Grandmother   . Other Paternal Grandfather        appendix rupture  . Hypertension Brother   . Hypertension Son     Social History   Socioeconomic History  . Marital status: Married    Spouse name: Audry Pili   . Number of children: 2  . Years of education: Not on file  . Highest education level: Some college, no degree  Occupational History  . Occupation: diabled    Comment: teller - [employer]  Tobacco Use  . Smoking status: Current Every Day Smoker    Packs/day: 0.50    Years: 40.00    Pack years: 20.00    Types: Cigarettes  . Smokeless tobacco: Never Used  . Tobacco comment: 3/4 pack a day sicne age 13  Substance and Sexual Activity  . Alcohol use: No    Alcohol/week: 0.0 standard drinks  . Drug use: No  . Sexual activity: Not Currently  Other Topics Concern  . Not on file  Social History Narrative   2  children  - 9 grandchildren    Social Determinants of Health   Financial Resource Strain: Low Risk   . Difficulty of Paying Living Expenses: Not hard at all  Food Insecurity: No Food Insecurity  . Worried About Charity fundraiser in the Last Year: Never true  . Ran Out of Food in the Last Year: Never true  Transportation Needs: No Transportation Needs  . Lack of Transportation (Medical): No  . Lack of Transportation (Non-Medical): No  Physical Activity: Inactive  . Days of Exercise per Week: 0 days  . Minutes of Exercise per Session: 0 min  Stress: No Stress Concern Present  . Feeling of Stress : Not at all  Social Connections: Not Isolated  . Frequency of Communication with Friends and Family: More than three times a week  . Frequency of Social Gatherings with Friends and Family: Once a week  . Attends Religious Services: More than 4 times per year  . Active Member of Clubs or Organizations: Yes  . Attends Archivist Meetings: More than 4 times per year  . Marital Status: Married  Human resources officer Violence: Not At Risk  . Fear of Current or Ex-Partner: No  . Emotionally Abused: No  . Physically Abused: No  . Sexually Abused: No    Outpatient Medications Prior to Visit  Medication Sig Dispense Refill  . albuterol (PROVENTIL) (2.5 MG/3ML) 0.083% nebulizer solution Take 3 mLs (2.5 mg total) by nebulization every 6 (six) hours as needed for wheezing or shortness of breath. 150 mL 1  . albuterol (VENTOLIN HFA) 108 (90 Base) MCG/ACT inhaler Inhale 2 puffs into the lungs every 6 (six) hours as needed. For shortness of breath 1 Inhaler 1  . Ascorbic Acid (VITAMIN C) 100 MG tablet Take 100 mg by mouth daily.    . budesonide-formoterol (SYMBICORT) 160-4.5 MCG/ACT inhaler Take 2 puffs first thing in am and then another 2 puffs about 12 hours later. 1 Inhaler 11  . cholecalciferol (VITAMIN D) 1000 units tablet Take 1,000 Units by mouth daily.    . Cinnamon 500 MG capsule     .  citalopram (CELEXA) 40 MG tablet Take 1 tablet (40 mg total) by mouth daily. TAKE ONE (1) TABLET EACH DAY 90 tablet 3  . cycloSPORINE (RESTASIS) 0.05 % ophthalmic emulsion  1 drop 2 (two) times daily.    . folic acid (FOLVITE) Q000111Q MCG tablet Take 0.5 tablets (400 mcg total) by mouth daily. 90 tablet 1  . furosemide (LASIX) 20 MG tablet Take 1-2 tablets (20-40 mg total) by mouth daily. As needed 60 tablet 6  . gabapentin (NEURONTIN) 600 MG tablet Take 1 tablet (600 mg total) by mouth 2 (two) times daily. Needs to be seen for future refills. 180 tablet 3  . Melatonin 10 MG TABS Take 1 tablet by mouth at bedtime.    . pantoprazole (PROTONIX) 40 MG tablet TAKE ONE (1) TABLET EACH DAY 90 tablet 3  . rosuvastatin (CRESTOR) 5 MG tablet Take 1 tablet (5 mg total) by mouth daily. 90 tablet 3  . tiotropium (SPIRIVA HANDIHALER) 18 MCG inhalation capsule Place 1 capsule (18 mcg total) into inhaler and inhale daily. 30 capsule 12  . triamcinolone cream (KENALOG) 0.1 %     . Zinc 50 MG TABS Take 25 mg by mouth daily.     Facility-Administered Medications Prior to Visit  Medication Dose Route Frequency Provider Last Rate Last Admin  . cyanocobalamin ((VITAMIN B-12)) injection 1,000 mcg  1,000 mcg Intramuscular Q30 days [name], [name], MD   1,000 mcg at 12/02/19 1023    Allergies  Allergen Reactions  . Fenofibrate Other (See Comments)    Flu-like   . Penicillin G Other (See Comments)    Blisters in mouth  . Pravastatin Other (See Comments)    "flu like symptoms"     ROS Review of Systems  Constitutional: Negative for activity change and appetite change.  Respiratory: Positive for shortness of breath. Negative for cough and chest tightness.   Cardiovascular: Positive for leg swelling. Negative for chest pain and palpitations.  Gastrointestinal: Negative for nausea.  Genitourinary: Negative for difficulty urinating.  Musculoskeletal: Positive for myalgias.  Skin: Positive for color change.    Neurological: Positive for dizziness. Negative for headaches.  Psychiatric/Behavioral: Negative.       Objective:    Physical Exam  Constitutional: She is oriented to person, place, and time. She appears well-developed and well-nourished.  HENT:  Mouth/Throat: Oropharynx is clear and moist.  Eyes: Conjunctivae are normal.  Cardiovascular: Normal rate, regular rhythm and normal heart sounds.  Pulmonary/Chest: Effort normal and breath sounds normal.  Abdominal: Bowel sounds are normal.  Musculoskeletal:        General: Tenderness present.     Cervical back: Neck supple.     Comments: Left calf pain  Neurological: She is alert and oriented to person, place, and time.  Skin: Skin is warm and dry.  Psychiatric: She has a normal mood and affect.    BP 135/64   Pulse 71   Temp 98.4 F (36.9 C)   Resp 20   Ht 5\' 10"  (1.778 m)   Wt 250 lb (113.4 kg)   SpO2 97%   BMI 35.87 kg/m  Wt Readings from Last 3 Encounters:  12/12/19 250 lb (113.4 kg)  12/02/19 255 lb 9.6 oz (115.9 kg)  04/28/19 241 lb 9.6 oz (109.6 kg)     Health Maintenance Due  Topic Date Due  . HIV Screening  Never done  . COVID-19 Vaccine (1) Never done  . URINE MICROALBUMIN  07/29/2016  . MAMMOGRAM  04/18/2018  . OPHTHALMOLOGY EXAM  12/16/2018    There are no preventive care reminders to display for this patient.  Lab Results  Component Value Date   TSH 3.580 03/16/2017  Lab Results  Component Value Date   WBC 11.3 (H) 11/01/2019   HGB 12.8 11/01/2019   HCT 38.9 11/01/2019   MCV 87 11/01/2019   PLT 299 11/01/2019   Lab Results  Component Value Date   NA 142 12/02/2019   K 4.0 12/02/2019   CO2 27 12/02/2019   GLUCOSE 150 (H) 12/02/2019   BUN 9 12/02/2019   CREATININE 0.75 12/02/2019   BILITOT 0.3 11/01/2019   ALKPHOS 91 11/01/2019   AST 13 11/01/2019   ALT 12 11/01/2019   PROT 6.4 11/01/2019   ALBUMIN 3.7 (L) 11/01/2019   CALCIUM 8.9 12/02/2019   ANIONGAP 6 02/13/2017   Lab Results   Component Value Date   CHOL 222 (H) 11/01/2019   Lab Results  Component Value Date   HDL 47 11/01/2019   Lab Results  Component Value Date   LDLCALC 147 (H) 11/01/2019   Lab Results  Component Value Date   TRIG 157 (H) 11/01/2019   Lab Results  Component Value Date   CHOLHDL 4.7 (H) 11/01/2019   Lab Results  Component Value Date   HGBA1C 6.9 11/01/2019      Assessment & Plan:   Problem List Items Addressed This Visit      Other   Pain of left calf - Primary     Patients left calf pain is not well controlled, this is a new problem for patient. I provided education for possible DVT, ultrasound ordered left lower extremity, if imaging comes back positive treatment will follow appropriately, but if  negative, compression socks and antiinflammatory will provide relief.  Patient knows to follow up with unresolved symptoms.  Patients ultrasound came back negative. Patient has been called and notified, educated to start ibuprofen 600 mg and compression socks.      Relevant Orders   US Venous Img Lower Unilateral Left (Completed)      No orders of the defined types were placed in this encounter.   Follow-up: Return if symptoms worsen or fail to improve.    Ivy Lynn, NP

## 2019-12-16 ENCOUNTER — Ambulatory Visit: Payer: Medicare HMO | Admitting: *Deleted

## 2019-12-19 ENCOUNTER — Encounter: Payer: Self-pay | Admitting: Cardiology

## 2019-12-19 NOTE — Progress Notes (Signed)
Cardiology Office Note  Date: 12/20/2019   ID: [name], [name] 04/25/1956, MRN [id number]  PCP:  [name], [name], MD  Cardiologist:  Rozann Lesches, MD Electrophysiologist:  None   Chief Complaint  Patient presents with  . Recurrent leg swelling    History of Present Illness: [name] is a 64 y.o. female referred for cardiology consultation by Ms. Hawks NP due to reported weight gain and leg swelling.  She states that a few weeks ago she developed worsening leg swelling, left greater than right, also experienced about a 10 pound weight gain.  She cannot determine any specific precipitant, no change in fluid intake or salt usage, no other health change or modification medicines.  She was given Lasix by PCP with improvement in symptoms and feels like she is back to baseline.  She states that she has had trouble with intermittent leg swelling over the years.  She does not describe any exertional chest pain or palpitations.  She has an apple watch and states that it typically indicates that she is in sinus rhythm (states that her father had atrial fibrillation).    She does have COPD and reports regular use of MDIs as noted below.  No recent wheezing or hospitalizations.  I personally reviewed her ECG today which shows normal sinus rhythm with nonspecific T wave changes.  She has not had a recent echocardiogram for cardiac structural evaluation.  Last study in 2018 demonstrated normal LVEF with mild LVH.  Past Medical History:  Diagnosis Date  . Anxiety   . Arthritis   . COPD (chronic obstructive pulmonary disease) ([place])   . GERD (gastroesophageal reflux disease)   . History of skin cancer   . Migraine   . Mixed hyperlipidemia   . Multiple sclerosis ([facility])    Left-sided weakness  . Neuropathy   . Pre-diabetes   . Psoriasis   . Seasonal allergies     Past Surgical History:  Procedure Laterality Date  . ABDOMINAL HYSTERECTOMY    . CHOLECYSTECTOMY    .  COLONOSCOPY N/A 08/02/2015   Procedure: COLONOSCOPY;  Surgeon: Rogene Houston, MD;  [facility];  Service: Endoscopy;  Laterality: N/A;  12:00-moved to 1220 Ann to notify pt  . KNEE ARTHROSCOPY     RT  . LAPAROSCOPIC APPENDECTOMY N/A 02/12/2017   Procedure: APPENDECTOMY LAPAROSCOPIC;  Surgeon: Aviva Signs, MD;  Location: [facility];  Service: General;  Laterality: N/A;  . Left breast lumpectomy    . Left carpal tunnel release    . PARTIAL KNEE ARTHROPLASTY Left 01/22/2015   Procedure: LEFT KNEE MEDIAL UNICOMPARTMENTAL KNEE;  Surgeon: Gaynelle Arabian, MD;  [facility];  Service: Orthopedics;  Laterality: Left;  . Right total knee replacement  2010  . SHOULDER SURGERY Left   . Sinus cyst removal    . TOTAL KNEE REVISION  05/05/2012   Procedure: TOTAL KNEE REVISION;  Surgeon: Gearlean Alf, MD;  [facility];  Service: Orthopedics;  Laterality: Right;  . WRIST SURGERY     rt    Current Outpatient Medications  Medication Sig Dispense Refill  . albuterol (PROVENTIL) (2.5 MG/3ML) 0.083% nebulizer solution Take 3 mLs (2.5 mg total) by nebulization every 6 (six) hours as needed for wheezing or shortness of breath. 150 mL 1  . albuterol (VENTOLIN HFA) 108 (90 Base) MCG/ACT inhaler Inhale 2 puffs into the lungs every 6 (six) hours as needed. For shortness of breath 1 Inhaler 1  .  Ascorbic Acid (VITAMIN C) 100 MG tablet Take 100 mg by mouth daily.    . budesonide-formoterol (SYMBICORT) 160-4.5 MCG/ACT inhaler Take 2 puffs first thing in am and then another 2 puffs about 12 hours later. 1 Inhaler 11  . cholecalciferol (VITAMIN D) 1000 units tablet Take 1,000 Units by mouth daily.    . Cinnamon 500 MG capsule     . citalopram (CELEXA) 40 MG tablet Take 1 tablet (40 mg total) by mouth daily. TAKE ONE (1) TABLET EACH DAY 90 tablet 3  . cycloSPORINE (RESTASIS) 0.05 % ophthalmic emulsion 1 drop 2 (two) times daily.    . folic acid (FOLVITE) Q000111Q MCG tablet Take 0.5 tablets (400 mcg total)  by mouth daily. 90 tablet 1  . furosemide (LASIX) 20 MG tablet Take 1-2 tablets (20-40 mg total) by mouth daily. As needed 60 tablet 6  . gabapentin (NEURONTIN) 600 MG tablet Take 1 tablet (600 mg total) by mouth 2 (two) times daily. Needs to be seen for future refills. 180 tablet 3  . Melatonin 10 MG TABS Take 1 tablet by mouth at bedtime.    . pantoprazole (PROTONIX) 40 MG tablet TAKE ONE (1) TABLET EACH DAY 90 tablet 3  . rosuvastatin (CRESTOR) 5 MG tablet Take 1 tablet (5 mg total) by mouth daily. 90 tablet 3  . tiotropium (SPIRIVA HANDIHALER) 18 MCG inhalation capsule Place 1 capsule (18 mcg total) into inhaler and inhale daily. 30 capsule 12  . triamcinolone cream (KENALOG) 0.1 %     . Zinc 50 MG TABS Take 25 mg by mouth daily.     Current Facility-Administered Medications  Medication Dose Route Frequency Provider Last Rate Last Admin  . cyanocobalamin ((VITAMIN B-12)) injection 1,000 mcg  1,000 mcg Intramuscular Q30 days [name], [name], MD   1,000 mcg at 12/02/19 1023   Allergies:  Fenofibrate, Penicillin g, and Pravastatin   Social History: The patient  reports that she has been smoking cigarettes. She started smoking about 47 years ago. She has a 20.00 pack-year smoking history. She has never used smokeless tobacco. She reports that she does not drink alcohol or use drugs.   Family History: The patient's family history includes Alcohol abuse in her brother; Arthritis in her son; Atrial fibrillation in her father; Cancer - Colon in her maternal grandmother; Celiac disease in her father; Dementia in her maternal grandmother and mother; Diabetes in her mother; Emphysema in her father; GI problems in her father; Heart attack in her paternal grandmother; Heart disease in her father and maternal grandfather; Hypertension in her brother and son; Other in her father, paternal grandfather, and sister; Parkinson's disease in her brother; Stroke in her maternal grandfather; Thyroid disease in  her mother.   ROS:  Please see the history of present illness. Otherwise, complete review of systems is positive for stable dyspnea on exertion.  All other systems are reviewed and negative.   Physical Exam: VS:  BP 126/74   Pulse 70   Ht 5\' 10"  (1.778 m)   Wt 249 lb (112.9 kg)   SpO2 97%   BMI 35.73 kg/m , BMI Body mass index is 35.73 kg/m.  Wt Readings from Last 3 Encounters:  12/20/19 249 lb (112.9 kg)  12/12/19 250 lb (113.4 kg)  12/02/19 255 lb 9.6 oz (115.9 kg)    General: Patient appears comfortable at rest. HEENT: Conjunctiva and lids normal, wearing a mask. Neck: Supple, no elevated JVP or carotid bruits, no thyromegaly. Lungs: Decreased breath sounds  without wheezing, nonlabored breathing at rest. Cardiac: Regular rate and rhythm, no S3 or significant systolic murmur, no pericardial rub. Abdomen: Soft, bowel sounds present. Extremities: Venous stasis with spider veins lower legs and mild ankle edema, distal pulses 2+. Skin: Warm and dry. Musculoskeletal: No kyphosis. Neuropsychiatric: Alert and oriented x3, affect grossly appropriate.  ECG:  An ECG dated 02/12/2017 was personally reviewed today and demonstrated:  Sinus rhythm with nonspecific T wave changes.  Recent Labwork: 11/01/2019: ALT 12; AST 13; Hemoglobin 12.8; Platelets 299 12/02/2019: BNP 27.6; BUN 9; Creatinine, Ser 0.75; Potassium 4.0; Sodium 142     Component Value Date/Time   CHOL 222 (H) 11/01/2019 1532   CHOL 219 (H) 01/10/2013 0900   TRIG 157 (H) 11/01/2019 1532   TRIG 121 01/10/2015 0955   TRIG 134 01/10/2013 0900   HDL 47 11/01/2019 1532   HDL 43 01/10/2015 0955   HDL 37 (L) 01/10/2013 0900   CHOLHDL 4.7 (H) 11/01/2019 1532   CHOLHDL 6.4 12/10/2010 0608   VLDL 45 (H) 12/10/2010 0608   LDLCALC 147 (H) 11/01/2019 1532   LDLCALC 148 (H) 03/20/2014 0826   LDLCALC 155 (H) 01/10/2013 0900    Other Studies Reviewed Today:  Echocardiogram 07/21/2017: Study Conclusions   - Left ventricle: The  cavity size was normal. Wall thickness was  increased in a pattern of mild LVH. Systolic function was normal.  The estimated ejection fraction was in the range of 60% to 65%.  Wall motion was normal; there were no regional wall motion  abnormalities. Left ventricular diastolic function parameters  were normal.  - Atrial septum: No defect or patent foramen ovale was identified.   Chest x-ray 10/26/2018: FINDINGS: There is bibasilar scarring. Lungs elsewhere are clear. Heart size and pulmonary vascularity are normal. No adenopathy. There is aortic atherosclerosis. No evident bone lesions.  IMPRESSION: Bibasilar scarring. Lungs elsewhere clear. Heart size normal. Aortic Atherosclerosis (ICD10-I70.0).  Assessment and Plan:  1.  Recurring lower leg swelling as described above.  Asymmetric, left greater than right, negative DVT evaluation on May 3.  She states that symptoms have improved back to baseline on Lasix.  Most likely multifactorial (venous insufficiency, possibly diastolic dysfunction and/or effect on RV by COPD).  We will obtain a follow-up echocardiogram for cardiac structural assessment.  I encouraged her to reduce fluid intake, continue to minimize salt intake as well.  She will use Lasix as needed per PCP.  2.  Mixed hyperlipidemia on Crestor with follow-up by PCP.  3.  COPD with history of tobacco abuse.  Medication Adjustments/Labs and Tests Ordered: Current medicines are reviewed at length with the patient today.  Concerns regarding medicines are outlined above.   Tests Ordered: Orders Placed This Encounter  Procedures  . EKG 12-Lead  . ECHOCARDIOGRAM COMPLETE    Medication Changes: No orders of the defined types were placed in this encounter.   Disposition:  Follow up test results.  Signed, Satira Sark, MD, [facility] 12/20/2019 9:32 AM    [facility] [place], [place], [place] [address] Phone: [phone];  Fax: [phone]

## 2019-12-20 ENCOUNTER — Other Ambulatory Visit: Payer: Self-pay

## 2019-12-20 ENCOUNTER — Ambulatory Visit (INDEPENDENT_AMBULATORY_CARE_PROVIDER_SITE_OTHER): Payer: Medicare HMO | Admitting: Cardiology

## 2019-12-20 ENCOUNTER — Encounter: Payer: Self-pay | Admitting: Cardiology

## 2019-12-20 VITALS — BP 126/74 | HR 70 | Ht 70.0 in | Wt 249.0 lb

## 2019-12-20 DIAGNOSIS — R600 Localized edema: Secondary | ICD-10-CM

## 2019-12-20 DIAGNOSIS — R0602 Shortness of breath: Secondary | ICD-10-CM

## 2019-12-20 DIAGNOSIS — J449 Chronic obstructive pulmonary disease, unspecified: Secondary | ICD-10-CM

## 2019-12-20 DIAGNOSIS — E782 Mixed hyperlipidemia: Secondary | ICD-10-CM | POA: Diagnosis not present

## 2019-12-20 NOTE — Chronic Care Management (AMB) (Signed)
  Chronic Care Management   Follow-up Note  12/16/2019 Name: [name] MRN: [id number] DOB: 17-Apr-1956  Referred by: [name], [name], MD Reason for referral : Chronic Care Management (RN follow up)   An unsuccessful telephone follow-up was attempted today. The patient was referred to the case management team for assistance with care management and care coordination.   Follow Up Plan: A HIPPA compliant phone message was left for the patient providing contact information and requesting a return call.  The care management team will reach out to the patient again over the next 45 days.   Chong Sicilian, BSN, RN-BC Embedded Chronic Care Manager [facility] Direct Dial: [phone]

## 2019-12-20 NOTE — Patient Instructions (Addendum)
Medication Instructions:   Your physician recommends that you continue on your current medications as directed. Please refer to the Current Medication list given to you today.  Labwork:  NONE  Testing/Procedures: Your physician has requested that you have an echocardiogram. Echocardiography is a painless test that uses sound waves to create images of your heart. It provides your doctor with information about the size and shape of your heart and how well your heart's chambers and valves are working. This procedure takes approximately one hour. There are no restrictions for this procedure.  Follow-Up:  Your physician recommends that you schedule a follow-up appointment in: pending.  Any Other Special Instructions Will Be Listed Below (If Applicable).  If you need a refill on your cardiac medications before your next appointment, please call your pharmacy. 

## 2020-01-02 ENCOUNTER — Other Ambulatory Visit: Payer: Self-pay

## 2020-01-02 ENCOUNTER — Other Ambulatory Visit: Payer: Self-pay | Admitting: Family Medicine

## 2020-01-02 ENCOUNTER — Ambulatory Visit (INDEPENDENT_AMBULATORY_CARE_PROVIDER_SITE_OTHER): Payer: Medicare HMO | Admitting: *Deleted

## 2020-01-02 DIAGNOSIS — D518 Other vitamin B12 deficiency anemias: Secondary | ICD-10-CM

## 2020-01-02 DIAGNOSIS — G629 Polyneuropathy, unspecified: Secondary | ICD-10-CM

## 2020-01-02 NOTE — Progress Notes (Addendum)
Patient in today for B12 injection. 1000 mcg given in left deltoid. Patient tolerated well.

## 2020-01-05 ENCOUNTER — Other Ambulatory Visit: Payer: Self-pay

## 2020-01-05 ENCOUNTER — Ambulatory Visit (INDEPENDENT_AMBULATORY_CARE_PROVIDER_SITE_OTHER): Payer: Medicare HMO

## 2020-01-05 DIAGNOSIS — R0602 Shortness of breath: Secondary | ICD-10-CM

## 2020-01-05 DIAGNOSIS — R600 Localized edema: Secondary | ICD-10-CM | POA: Diagnosis not present

## 2020-01-06 ENCOUNTER — Telehealth: Payer: Self-pay | Admitting: *Deleted

## 2020-01-06 NOTE — Telephone Encounter (Signed)
Patient informed. Copy sent to PCP °

## 2020-01-06 NOTE — Telephone Encounter (Signed)
-----   Message from Satira Sark, MD sent at 01/06/2020  8:03 AM EDT ----- Results reviewed.  LVEF remains normal at 60 to 65%, also normal RV contraction.  No change in plan for now, as needed use of Lasix per discussion at recent office visit.

## 2020-01-10 ENCOUNTER — Ambulatory Visit (INDEPENDENT_AMBULATORY_CARE_PROVIDER_SITE_OTHER): Payer: Medicare HMO | Admitting: *Deleted

## 2020-01-10 DIAGNOSIS — E1169 Type 2 diabetes mellitus with other specified complication: Secondary | ICD-10-CM

## 2020-01-10 DIAGNOSIS — J449 Chronic obstructive pulmonary disease, unspecified: Secondary | ICD-10-CM | POA: Diagnosis not present

## 2020-01-10 DIAGNOSIS — M7989 Other specified soft tissue disorders: Secondary | ICD-10-CM

## 2020-01-10 NOTE — Chronic Care Management (AMB) (Signed)
Chronic Care Management   Follow Up Note   01/10/2020 Name: [name] MRN: [id number] DOB: 1956-03-01  Referred by: [name], [name], MD Reason for referral : Chronic Care Management (RN follow up)   [name] is a 64 y.o. year old female who is a primary care patient of [name], [name], MD. The CCM team was consulted for assistance with chronic disease management and care coordination needs.    Review of patient status, including review of consultants reports, relevant laboratory and other test results, and collaboration with appropriate care team members and the patient's provider was performed as part of comprehensive patient evaluation and provision of chronic care management services.    SDOH (Social Determinants of Health) assessments performed: No See Care Plan activities for detailed interventions related to [facility])     Outpatient Encounter Medications as of 01/10/2020  Medication Sig  . albuterol (PROVENTIL) (2.5 MG/3ML) 0.083% nebulizer solution Take 3 mLs (2.5 mg total) by nebulization every 6 (six) hours as needed for wheezing or shortness of breath.  Marland Kitchen albuterol (VENTOLIN HFA) 108 (90 Base) MCG/ACT inhaler Inhale 2 puffs into the lungs every 6 (six) hours as needed. For shortness of breath  . Ascorbic Acid (VITAMIN C) 100 MG tablet Take 100 mg by mouth daily.  . budesonide-formoterol (SYMBICORT) 160-4.5 MCG/ACT inhaler Take 2 puffs first thing in am and then another 2 puffs about 12 hours later.  . cholecalciferol (VITAMIN D) 1000 units tablet Take 1,000 Units by mouth daily.  . Cinnamon 500 MG capsule   . citalopram (CELEXA) 40 MG tablet Take 1 tablet (40 mg total) by mouth daily. TAKE ONE (1) TABLET EACH DAY  . cycloSPORINE (RESTASIS) 0.05 % ophthalmic emulsion 1 drop 2 (two) times daily.  . folic acid (FOLVITE) [id number] MCG tablet Take 0.5 tablets (400 mcg total) by mouth daily.  . furosemide (LASIX) 20 MG tablet Take 1-2 tablets (20-40 mg total) by mouth daily.  As needed  . gabapentin (NEURONTIN) 600 MG tablet TAKE ONE (1) TABLET THREE (3) TIMES EACH DAY  . Melatonin 10 MG TABS Take 1 tablet by mouth at bedtime.  . pantoprazole (PROTONIX) 40 MG tablet TAKE ONE (1) TABLET EACH DAY  . rosuvastatin (CRESTOR) 5 MG tablet Take 1 tablet (5 mg total) by mouth daily.  Marland Kitchen tiotropium (SPIRIVA HANDIHALER) 18 MCG inhalation capsule Place 1 capsule (18 mcg total) into inhaler and inhale daily.  Marland Kitchen triamcinolone cream (KENALOG) 0.1 %   . Zinc 50 MG TABS Take 25 mg by mouth daily.   Facility-Administered Encounter Medications as of 01/10/2020  Medication  . cyanocobalamin ((VITAMIN B-12)) injection 1,000 mcg    RN Care Plan   . "I want to lose weight" (pt-stated)       CARE PLAN ENTRY (see longitudinal plan of care for additional care plan information)  Current Barriers:  . Care Coordination needs related to weight management in a patient with diabetes and COPD (disease states)  Nurse Case Manager Clinical Goal(s):  Marland Kitchen Over the next 60 days, patient will work with Consulting civil engineer to address needs related to weight management  Interventions:  . Inter-disciplinary care team collaboration (see longitudinal plan of care) . Chart reviewed including recent office notes and lab results . Talked with patient by telephone . Discussed diet o Does not like vegetables o Limits simple carbohydrates o Eats protein and fruits . Discussed that patient has seen a dietician in the past and did not find that particular one  helpful . Discussed activity level o Patient is very active with many hobbies at home. She does not sit idly during the day. . Reviewed and discussed medications . Discussed stress and hormone's role in weight retention . Reviewed upcoming appointments: [name] 02/06/20  Patient Self Care Activities:  . Performs ADL's independently . Performs IADL's independently  Initial goal documentation     . "I want to manage the fluid in my legs"  (pt-stated)       CARE PLAN ENTRY (see longitudinal plan of care for additional care plan information)  Current Barriers:  . Care Coordination needs related to lower extremity edema in a patient with COPD and DM (disease states)  Nurse Case Manager Clinical Goal(s):  Marland Kitchen Over the next 60 days, the patient will demonstrate ongoing self health care management ability as evidenced by taking medication as prescribed and weighing daily*  Interventions:  . Inter-disciplinary care team collaboration (see longitudinal plan of care) . Evaluation of current treatment plan related to lower extremity edema and patient's adherence to plan as established by provider. . Reviewed medications with patient and discussed Lasix . Chart reviewed including recent office notes and lab results . Talked with patient by telephone o Reports that swelling fluctuates but has improved . Encouraged patient to continue weighing and recording daily weights in the morning after urinating . Reach out to PCP at [phone] if weight increases >3 lbs in 24 hrs of >5 lbs in one week . Discussed diet and decreasing salt in diet . Discussed management o Not wearing compression hose because they are too hot o Propping feet up when sitting/resting o Weighing daily o Taking Lasix as needed . She is active during the day and does not sit idly often during the day . Provided with RNCM contact information and encouraged to reach out as needed  Patient Self Care Activities:  . Performs ADL's independently . Performs IADL's independently  Initial goal documentation           Plan:   The care management team will reach out to the patient again over the next 60 days.    Chong Sicilian, BSN, RN-BC Embedded Chronic Care Manager [facility] [place] [facility] Direct Dial: [phone]

## 2020-01-10 NOTE — Patient Instructions (Signed)
Visit Information  Goals Addressed            This Visit's Progress     Patient Stated   . "I want to lose weight" (pt-stated)       CARE PLAN ENTRY (see longitudinal plan of care for additional care plan information)  Current Barriers:  . Care Coordination needs related to weight management in a patient with diabetes and COPD (disease states)  Nurse Case Manager Clinical Goal(s):  Marland Kitchen Over the next 60 days, patient will work with Consulting civil engineer to address needs related to weight management  Interventions:  . Inter-disciplinary care team collaboration (see longitudinal plan of care) . Chart reviewed including recent office notes and lab results . Talked with patient by telephone . Discussed diet o Does not like vegetables o Limits simple carbohydrates o Eats protein and fruits . Discussed that patient has seen a dietician in the past and did not find that particular one helpful . Discussed activity level o Patient is very active with many hobbies at home. She does not sit idly during the day. . Reviewed and discussed medications . Discussed stress and hormone's role in weight retention . Reviewed upcoming appointments: [name] 02/06/20  Patient Self Care Activities:  . Performs ADL's independently . Performs IADL's independently  Initial goal documentation     . "I want to manage the fluid in my legs" (pt-stated)       CARE PLAN ENTRY (see longitudinal plan of care for additional care plan information)  Current Barriers:  . Care Coordination needs related to lower extremity edema in a patient with COPD and DM (disease states)  Nurse Case Manager Clinical Goal(s):  Marland Kitchen Over the next 60 days, the patient will demonstrate ongoing self health care management ability as evidenced by taking medication as prescribed and weighing daily*  Interventions:  . Inter-disciplinary care team collaboration (see longitudinal plan of care) . Evaluation of current treatment plan  related to lower extremity edema and patient's adherence to plan as established by provider. . Reviewed medications with patient and discussed Lasix . Chart reviewed including recent office notes and lab results . Talked with patient by telephone o Reports that swelling fluctuates but has improved . Encouraged patient to continue weighing and recording daily weights in the morning after urinating . Reach out to PCP at [phone] if weight increases >3 lbs in 24 hrs of >5 lbs in one week . Discussed diet and decreasing salt in diet . Discussed management o Not wearing compression hose because they are too hot o Propping feet up when sitting/resting o Weighing daily o Taking Lasix as needed . She is active during the day and does not sit idly often during the day . Provided with RNCM contact information and encouraged to reach out as needed  Patient Self Care Activities:  . Performs ADL's independently . Performs IADL's independently  Initial goal documentation        The patient verbalized understanding of instructions provided today and declined a print copy of patient instruction materials.   Follow-up Plan The care management team will reach out to the patient again over the next 60 days.   Chong Sicilian, BSN, RN-BC Embedded Chronic Care Manager [facility] / [place] [facility] Direct Dial: [phone]

## 2020-01-12 ENCOUNTER — Other Ambulatory Visit: Payer: Self-pay | Admitting: Family Medicine

## 2020-01-12 DIAGNOSIS — G629 Polyneuropathy, unspecified: Secondary | ICD-10-CM

## 2020-02-06 ENCOUNTER — Other Ambulatory Visit: Payer: Self-pay

## 2020-02-06 ENCOUNTER — Ambulatory Visit (INDEPENDENT_AMBULATORY_CARE_PROVIDER_SITE_OTHER): Payer: Medicare HMO | Admitting: Family Medicine

## 2020-02-06 ENCOUNTER — Encounter: Payer: Self-pay | Admitting: Family Medicine

## 2020-02-06 VITALS — BP 124/63 | HR 68 | Temp 98.0°F | Ht 70.0 in | Wt 250.0 lb

## 2020-02-06 DIAGNOSIS — E1169 Type 2 diabetes mellitus with other specified complication: Secondary | ICD-10-CM | POA: Diagnosis not present

## 2020-02-06 DIAGNOSIS — J449 Chronic obstructive pulmonary disease, unspecified: Secondary | ICD-10-CM | POA: Diagnosis not present

## 2020-02-06 DIAGNOSIS — D518 Other vitamin B12 deficiency anemias: Secondary | ICD-10-CM

## 2020-02-06 DIAGNOSIS — G629 Polyneuropathy, unspecified: Secondary | ICD-10-CM | POA: Diagnosis not present

## 2020-02-06 DIAGNOSIS — E782 Mixed hyperlipidemia: Secondary | ICD-10-CM | POA: Diagnosis not present

## 2020-02-06 DIAGNOSIS — K219 Gastro-esophageal reflux disease without esophagitis: Secondary | ICD-10-CM | POA: Diagnosis not present

## 2020-02-06 LAB — BAYER DCA HB A1C WAIVED: HB A1C (BAYER DCA - WAIVED): 7.3 % — ABNORMAL HIGH (ref <7.0)

## 2020-02-06 MED ORDER — GABAPENTIN 600 MG PO TABS: 600.0000 mg | ORAL_TABLET | Freq: Three times a day (TID) | ORAL | 3 refills | Status: DC

## 2020-02-06 NOTE — Progress Notes (Signed)
BP 124/63   Pulse 68   Temp 98 F (36.7 C)   Ht 5' 10"  (1.778 m)   Wt 250 lb (113.4 kg)   SpO2 99%   BMI 35.87 kg/m    Subjective:   Patient ID: [name], female    DOB: 1955/08/24, 64 y.o.   MRN: [id number]  HPI: [name] is a 64 y.o. female presenting on 02/06/2020 for Medical Management of Chronic Issues, Diabetes, and Hyperlipidemia   HPI Type 2 diabetes mellitus Patient comes in today for recheck of his diabetes. Patient has been currently taking no medication and has been diet controlled. Patient is not currently on an ACE inhibitor/ARB. Patient has not seen an ophthalmologist this year. Patient denies any issues with their feet, does take gabapentin to help with neuropathy. The symptom started onset as an adult hyperlipidemia and morbid obesity ARE RELATED TO DM   Hyperlipidemia Patient is coming in for recheck of his hyperlipidemia. The patient is currently taking Crestor but had issues with constipation so she stopped it a week ago. They deny any issues with myalgias or history of liver damage from it. They deny any focal numbness or weakness or chest pain.   GERD Patient is currently on pantoprazole.  She denies any major symptoms or abdominal pain or belching or burping. She denies any blood in her stool or lightheadedness or dizziness.   COPD Patient is coming in for COPD recheck today.  He is currently on Spiriva and albuterol and Symbicort.  He has a mild chronic cough but denies any major coughing spells or wheezing spells.  He has 0nighttime symptoms per week and 1daytime symptoms per week currently.  She is doing fine right now as long as she stays out of the humidity then she does pretty good.  Patient is coming in today for B12 deficiency injection and recheck.  She has been doing pretty good on that.  Patient comes in complaining of fatigue and hair loss and nail changes weight gain and she thinks she may have thyroid issues, her mother had thyroid  issues and she wants to be checked for this today.  Relevant past medical, surgical, family and social history reviewed and updated as indicated. Interim medical history since our last visit reviewed. Allergies and medications reviewed and updated.  Review of Systems  Constitutional: Positive for fatigue and unexpected weight change. Negative for chills and fever.  Eyes: Negative for redness and visual disturbance.  Respiratory: Negative for chest tightness and shortness of breath.   Cardiovascular: Negative for chest pain and leg swelling.  Musculoskeletal: Negative for back pain and gait problem.  Skin: Negative for color change, rash and wound.  Neurological: Negative for light-headedness and headaches.  Psychiatric/Behavioral: Negative for agitation and behavioral problems.  All other systems reviewed and are negative.   Per HPI unless specifically indicated above   Allergies as of 02/06/2020      Reactions   Fenofibrate Other (See Comments)   Flu-like    Penicillin G Other (See Comments)   Blisters in mouth   Pravastatin Other (See Comments)   "flu like symptoms"       Medication List       Accurate as of February 06, 2020 12:25 PM. If you have any questions, ask your nurse or doctor.        albuterol (2.5 MG/3ML) 0.083% nebulizer solution Commonly known as: PROVENTIL Take 3 mLs (2.5 mg total) by nebulization every 6 (six) hours as  needed for wheezing or shortness of breath.   albuterol 108 (90 Base) MCG/ACT inhaler Commonly known as: Ventolin HFA Inhale 2 puffs into the lungs every 6 (six) hours as needed. For shortness of breath   budesonide-formoterol 160-4.5 MCG/ACT inhaler Commonly known as: Symbicort Take 2 puffs first thing in am and then another 2 puffs about 12 hours later.   cholecalciferol 1000 units tablet Commonly known as: VITAMIN D Take 1,000 Units by mouth daily.   Cinnamon 500 MG capsule   citalopram 40 MG tablet Commonly known as: CELEXA Take  1 tablet (40 mg total) by mouth daily. TAKE ONE (1) TABLET EACH DAY   cycloSPORINE 0.05 % ophthalmic emulsion Commonly known as: RESTASIS 1 drop 2 (two) times daily.   folic acid 161 MCG tablet Commonly known as: FOLVITE Take 0.5 tablets (400 mcg total) by mouth daily.   furosemide 20 MG tablet Commonly known as: LASIX Take 1-2 tablets (20-40 mg total) by mouth daily. As needed   gabapentin 600 MG tablet Commonly known as: NEURONTIN Take 1 tablet (600 mg total) by mouth 3 (three) times daily. What changed: See the new instructions. Changed by: [name] Arieon Scalzo, MD   Melatonin 10 MG Tabs Take 1 tablet by mouth at bedtime.   pantoprazole 40 MG tablet Commonly known as: PROTONIX TAKE ONE (1) TABLET EACH DAY   rosuvastatin 5 MG tablet Commonly known as: Crestor Take 1 tablet (5 mg total) by mouth daily.   tiotropium 18 MCG inhalation capsule Commonly known as: Spiriva HandiHaler Place 1 capsule (18 mcg total) into inhaler and inhale daily.   triamcinolone cream 0.1 % Commonly known as: KENALOG   vitamin C 100 MG tablet Take 100 mg by mouth daily.   Zinc 50 MG Tabs Take 25 mg by mouth daily.        Objective:   BP 124/63   Pulse 68   Temp 98 F (36.7 C)   Ht 5' 10"  (1.778 m)   Wt 250 lb (113.4 kg)   SpO2 99%   BMI 35.87 kg/m   Wt Readings from Last 3 Encounters:  02/06/20 250 lb (113.4 kg)  12/20/19 249 lb (112.9 kg)  12/12/19 250 lb (113.4 kg)    Physical Exam Vitals and nursing note reviewed.  Constitutional:      General: She is not in acute distress.    Appearance: She is well-developed. She is not diaphoretic.  Eyes:     Conjunctiva/sclera: Conjunctivae normal.  Cardiovascular:     Rate and Rhythm: Normal rate and regular rhythm.     Heart sounds: Normal heart sounds. No murmur heard.   Pulmonary:     Effort: Pulmonary effort is normal. No respiratory distress.     Breath sounds: Normal breath sounds. No wheezing.  Musculoskeletal:         General: No tenderness. Normal range of motion.  Skin:    General: Skin is warm and dry.     Findings: No rash.  Neurological:     Mental Status: She is alert and oriented to person, place, and time.     Coordination: Coordination normal.  Psychiatric:        Behavior: Behavior normal.       Assessment & Plan:   Problem List Items Addressed This Visit      Respiratory   COPD with chronic bronchitis ([place])   Relevant Orders   Thyroid Panel With TSH     Digestive   Gastroesophageal reflux disease without  esophagitis   Relevant Orders   CBC with Differential/Platelet   Thyroid Panel With TSH     Endocrine   Type 2 diabetes mellitus with other specified complication (HCC) - Primary   Relevant Orders   Bayer DCA Hb A1c Waived   CMP14+EGFR   Thyroid Panel With TSH     Other   Mixed hyperlipidemia   Relevant Orders   Lipid panel   Thyroid Panel With TSH    Other Visit Diagnoses    Neuropathy       Relevant Medications   gabapentin (NEURONTIN) 600 MG tablet      Patient seems to be doing well, A1c is still pending so we will watch for that.  With hair and nail changes and fatigue, could be subjective but will we will go ahead and check her thyroid.  Patient seems to be doing well on all other medications and will continue as is. Follow up plan: Return in about 3 months (around 05/08/2020), or if symptoms worsen or fail to improve, for Diabetes and cholesterol recheck.  Counseling provided for all of the vaccine components Orders Placed This Encounter  Procedures  . Bayer DCA Hb A1c Waived  . CBC with Differential/Platelet  . CMP14+EGFR  . Lipid panel  . Thyroid Panel With TSH    Caryl Pina, MD [facility] 02/06/2020, 12:25 PM

## 2020-02-07 LAB — CMP14+EGFR
ALT: 10 IU/L (ref 0–32)
AST: 9 IU/L (ref 0–40)
Albumin/Globulin Ratio: 2 (ref 1.2–2.2)
Albumin: 3.9 g/dL (ref 3.8–4.8)
Alkaline Phosphatase: 90 IU/L (ref 48–121)
BUN/Creatinine Ratio: 12 (ref 12–28)
BUN: 10 mg/dL (ref 8–27)
Bilirubin Total: 0.3 mg/dL (ref 0.0–1.2)
CO2: 23 mmol/L (ref 20–29)
Calcium: 8.9 mg/dL (ref 8.7–10.3)
Chloride: 103 mmol/L (ref 96–106)
Creatinine, Ser: 0.83 mg/dL (ref 0.57–1.00)
GFR calc Af Amer: 88 mL/min/{1.73_m2} (ref >59)
GFR calc non Af Amer: 76 mL/min/{1.73_m2} (ref >59)
Globulin, Total: 2 g/dL (ref 1.5–4.5)
Glucose: 96 mg/dL (ref 65–99)
Potassium: 4.1 mmol/L (ref 3.5–5.2)
Sodium: 143 mmol/L (ref 134–144)
Total Protein: 5.9 g/dL — ABNORMAL LOW (ref 6.0–8.5)

## 2020-02-07 LAB — CBC WITH DIFFERENTIAL/PLATELET
Basophils Absolute: 0.1 10*3/uL (ref 0.0–0.2)
Basos: 1 %
EOS (ABSOLUTE): 0.1 10*3/uL (ref 0.0–0.4)
Eos: 1 %
Hematocrit: 39.2 % (ref 34.0–46.6)
Hemoglobin: 12.5 g/dL (ref 11.1–15.9)
Immature Grans (Abs): 0 10*3/uL (ref 0.0–0.1)
Immature Granulocytes: 0 %
Lymphocytes Absolute: 2.6 10*3/uL (ref 0.7–3.1)
Lymphs: 24 %
MCH: 28.4 pg (ref 26.6–33.0)
MCHC: 31.9 g/dL (ref 31.5–35.7)
MCV: 89 fL (ref 79–97)
Monocytes Absolute: 0.6 10*3/uL (ref 0.1–0.9)
Monocytes: 5 %
Neutrophils Absolute: 7.1 10*3/uL — ABNORMAL HIGH (ref 1.4–7.0)
Neutrophils: 69 %
Platelets: 254 10*3/uL (ref 150–450)
RBC: 4.4 x10E6/uL (ref 3.77–5.28)
RDW: 12.8 % (ref 11.7–15.4)
WBC: 10.5 10*3/uL (ref 3.4–10.8)

## 2020-02-07 LAB — LIPID PANEL
Chol/HDL Ratio: 4.2 ratio (ref 0.0–4.4)
Cholesterol, Total: 189 mg/dL (ref 100–199)
HDL: 45 mg/dL (ref >39)
LDL Chol Calc (NIH): 116 mg/dL — ABNORMAL HIGH (ref 0–99)
Triglycerides: 158 mg/dL — ABNORMAL HIGH (ref 0–149)
VLDL Cholesterol Cal: 28 mg/dL (ref 5–40)

## 2020-02-07 LAB — THYROID PANEL WITH TSH
Free Thyroxine Index: 1.5 (ref 1.2–4.9)
T3 Uptake Ratio: 24 % (ref 24–39)
T4, Total: 6.3 ug/dL (ref 4.5–12.0)
TSH: 2.97 u[IU]/mL (ref 0.450–4.500)

## 2020-03-08 ENCOUNTER — Ambulatory Visit (INDEPENDENT_AMBULATORY_CARE_PROVIDER_SITE_OTHER): Payer: Medicare HMO

## 2020-03-08 ENCOUNTER — Other Ambulatory Visit: Payer: Self-pay

## 2020-03-08 DIAGNOSIS — D518 Other vitamin B12 deficiency anemias: Secondary | ICD-10-CM

## 2020-03-08 MED ORDER — CYANOCOBALAMIN 1000 MCG/ML IJ SOLN
1000.0000 ug | INTRAMUSCULAR | Status: AC
  Administered 2020-03-08 – 2024-05-30 (×50): 1000 ug via INTRAMUSCULAR

## 2020-03-08 NOTE — Progress Notes (Signed)
Cyanocobalamin injection given to right deltoid.  Patient tolerated well. 

## 2020-03-12 ENCOUNTER — Ambulatory Visit (INDEPENDENT_AMBULATORY_CARE_PROVIDER_SITE_OTHER): Payer: Medicare HMO | Admitting: *Deleted

## 2020-03-12 ENCOUNTER — Other Ambulatory Visit: Payer: Self-pay | Admitting: Family Medicine

## 2020-03-12 DIAGNOSIS — J449 Chronic obstructive pulmonary disease, unspecified: Secondary | ICD-10-CM

## 2020-03-12 DIAGNOSIS — F411 Generalized anxiety disorder: Secondary | ICD-10-CM

## 2020-03-12 DIAGNOSIS — E1169 Type 2 diabetes mellitus with other specified complication: Secondary | ICD-10-CM | POA: Diagnosis not present

## 2020-03-19 NOTE — Patient Instructions (Signed)
Visit Information  Goals Addressed              This Visit's Progress     Patient Stated   .  "I want to keep my blood sugar under control" (pt-stated)        Current Barriers:  . Chronic Disease Management support and education needs related to diabetes management  Nurse Case Manager Clinical Goal(s):  Marland Kitchen Over the next 30 days, patient will work with [facility] to address needs related to self management of diabetes  Interventions:  . Evaluation of current treatment plan related to diabetes and patient's adherence to plan as established by provider. . Provided education to patient re: diet and exercise . Discussed plans with patient for ongoing care management follow up and provided patient with direct contact information for care management team . Chart reviewed including recent office notes and lab results . Provided with RN contact number and encouraged to reach out as needed  Patient Self Care Activities:  . Performs ADL's independently . Performs IADL's independently  Initial goal documentation and Please see past updates related to this goal by clicking on the "Past Updates" button in the selected goal      .  "I want to manage the fluid in my legs" (pt-stated)   On track     [place] (see longitudinal plan of care for additional care plan information)  Current Barriers:  . Care Coordination needs related to lower extremity edema in a patient with COPD and DM (disease states)  Nurse Case Manager Clinical Goal(s):  Marland Kitchen Over the next 60 days, the patient will demonstrate ongoing self health care management ability as evidenced by taking medication as prescribed and weighing daily*  Interventions:  . Inter-disciplinary care team collaboration (see longitudinal plan of care) . Evaluation of current treatment plan related to lower extremity edema and patient's adherence to plan as established by provider. . Reviewed medications with patient and discussed Lasix . Chart  reviewed including recent office notes and lab results . Talked with patient by telephone o Reports that swelling fluctuates but has improved . Encouraged patient to continue weighing and recording daily weights in the morning after urinating . Reach out to PCP at [phone] if weight increases >3 lbs in 24 hrs of >5 lbs in one week . Discussed diet and decreasing salt in diet . Discussed management o Not wearing compression hose because they are too hot o Propping feet up when sitting/resting o Weighing daily o Taking Lasix as needed . She is active during the day and does not sit idly often during the day . Provided with RNCM contact information and encouraged to reach out as needed  Patient Self Care Activities:  . Performs ADL's independently . Performs IADL's independently  Initial goal documentation     .  "I want to quit smoking" (pt-stated)   Not on track     Current Barriers:  . Nicotine addiction . Stress/anxiety  Nurse Case Manager Clinical Goal(s):  Marland Kitchen Over the next 60 days, patient will stop smoking.  Interventions:  . Evaluation of current treatment plan related to smoking cesssation and patient's adherence to plan as established by provider. . Previously provided education to patient re: smoking cessation strategies . Previously reviewed medications with patient and discussed previous Chantix prescription did not work . Discussed abnormal growth in mouth/throat. Last ENT eval was negative. Referred for 2nd opinion and waiting on appt.  . Provided with RN CM contact number and  encouraged to reach out as needed  Patient Self Care Activities:  . Performs ADL's independently . Performs IADL's independently  Please see past updates related to this goal by clicking on the "Past Updates" button in the selected goal         Patient verbalizes understanding of instructions provided today.   Follow-up Plan The care management team will reach out to the patient  again over the next 60 days.   Chong Sicilian, BSN, RN-BC Embedded Chronic Care Manager [facility] [place] [facility] Direct Dial: [phone]

## 2020-03-19 NOTE — Chronic Care Management (AMB) (Signed)
Chronic Care Management   Follow Up Note   03/12/2020 Name: [name] MRN: [id number] DOB: 03/04/1956  Referred by: [name], [name], MD Reason for referral : Chronic Care Management (RN follow up)   [name] is a 64 y.o. year old female who is a primary care patient of [name], [name], MD. The CCM team was consulted for assistance with chronic disease management and care coordination needs.    Review of patient status, including review of consultants reports, relevant laboratory and other test results, and collaboration with appropriate care team members and the patient's provider was performed as part of comprehensive patient evaluation and provision of chronic care management services.    SDOH (Social Determinants of Health) assessments performed: No See Care Plan activities for detailed interventions related to [facility])     Outpatient Encounter Medications as of 03/12/2020  Medication Sig  . albuterol (PROVENTIL) (2.5 MG/3ML) 0.083% nebulizer solution Take 3 mLs (2.5 mg total) by nebulization every 6 (six) hours as needed for wheezing or shortness of breath.  Marland Kitchen albuterol (VENTOLIN HFA) 108 (90 Base) MCG/ACT inhaler Inhale 2 puffs into the lungs every 6 (six) hours as needed. For shortness of breath  . Ascorbic Acid (VITAMIN C) 100 MG tablet Take 100 mg by mouth daily.  . budesonide-formoterol (SYMBICORT) 160-4.5 MCG/ACT inhaler Take 2 puffs first thing in am and then another 2 puffs about 12 hours later.  . cholecalciferol (VITAMIN D) 1000 units tablet Take 1,000 Units by mouth daily.  . Cinnamon 500 MG capsule   . citalopram (CELEXA) 40 MG tablet TAKE ONE (1) TABLET EACH DAY  . cycloSPORINE (RESTASIS) 0.05 % ophthalmic emulsion 1 drop 2 (two) times daily.  . folic acid (FOLVITE) 962 MCG tablet Take 0.5 tablets (400 mcg total) by mouth daily.  . furosemide (LASIX) 20 MG tablet Take 1-2 tablets (20-40 mg total) by mouth daily. As needed  . gabapentin (NEURONTIN) 600 MG  tablet Take 1 tablet (600 mg total) by mouth 3 (three) times daily.  . Melatonin 10 MG TABS Take 1 tablet by mouth at bedtime.  . pantoprazole (PROTONIX) 40 MG tablet TAKE ONE (1) TABLET EACH DAY  . rosuvastatin (CRESTOR) 5 MG tablet Take 1 tablet (5 mg total) by mouth daily.  Marland Kitchen tiotropium (SPIRIVA HANDIHALER) 18 MCG inhalation capsule Place 1 capsule (18 mcg total) into inhaler and inhale daily.  Marland Kitchen triamcinolone cream (KENALOG) 0.1 %   . Zinc 50 MG TABS Take 25 mg by mouth daily.   Facility-Administered Encounter Medications as of 03/12/2020  Medication  . cyanocobalamin ((VITAMIN B-12)) injection 1,000 mcg     Objective:   Goals Addressed              This Visit's Progress     Patient Stated   .  "I want to keep my blood sugar under control" (pt-stated)        Current Barriers:  . Chronic Disease Management support and education needs related to diabetes management  Nurse Case Manager Clinical Goal(s):  Marland Kitchen Over the next 30 days, patient will work with [facility] to address needs related to self management of diabetes  Interventions:  . Evaluation of current treatment plan related to diabetes and patient's adherence to plan as established by provider. . Provided education to patient re: diet and exercise . Discussed plans with patient for ongoing care management follow up and provided patient with direct contact information for care management team . Chart reviewed including recent office  notes and lab results . Provided with RN contact number and encouraged to reach out as needed  Patient Self Care Activities:  . Performs ADL's independently . Performs IADL's independently  Initial goal documentation and Please see past updates related to this goal by clicking on the "Past Updates" button in the selected goal      .  "I want to manage the fluid in my legs" (pt-stated)   On track     [place] (see longitudinal plan of care for additional care plan information)  Current  Barriers:  . Care Coordination needs related to lower extremity edema in a patient with COPD and DM (disease states)  Nurse Case Manager Clinical Goal(s):  Marland Kitchen Over the next 60 days, the patient will demonstrate ongoing self health care management ability as evidenced by taking medication as prescribed and weighing daily*  Interventions:  . Inter-disciplinary care team collaboration (see longitudinal plan of care) . Evaluation of current treatment plan related to lower extremity edema and patient's adherence to plan as established by provider. . Reviewed medications with patient and discussed Lasix . Chart reviewed including recent office notes and lab results . Talked with patient by telephone o Reports that swelling fluctuates but has improved . Encouraged patient to continue weighing and recording daily weights in the morning after urinating . Reach out to PCP at [phone] if weight increases >3 lbs in 24 hrs of >5 lbs in one week . Discussed diet and decreasing salt in diet . Discussed management o Not wearing compression hose because they are too hot o Propping feet up when sitting/resting o Weighing daily o Taking Lasix as needed . She is active during the day and does not sit idly often during the day . Provided with RNCM contact information and encouraged to reach out as needed  Patient Self Care Activities:  . Performs ADL's independently . Performs IADL's independently  Initial goal documentation     .  "I want to quit smoking" (pt-stated)   Not on track     Current Barriers:  . Nicotine addiction . Stress/anxiety  Nurse Case Manager Clinical Goal(s):  Marland Kitchen Over the next 60 days, patient will stop smoking.  Interventions:  . Evaluation of current treatment plan related to smoking cesssation and patient's adherence to plan as established by provider. . Previously provided education to patient re: smoking cessation strategies . Previously reviewed medications with  patient and discussed previous Chantix prescription did not work . Discussed abnormal growth in mouth/throat. Last ENT eval was negative. Referred for 2nd opinion and waiting on appt.  . Provided with RN CM contact number and encouraged to reach out as needed  Patient Self Care Activities:  . Performs ADL's independently . Performs IADL's independently  Please see past updates related to this goal by clicking on the "Past Updates" button in the selected goal          Plan:   The care management team will reach out to the patient again over the next 60 days.    Chong Sicilian, BSN, RN-BC Embedded Chronic Care Manager [facility] [place] [facility] Direct Dial: [phone]

## 2020-03-22 ENCOUNTER — Ambulatory Visit: Payer: Medicare HMO | Admitting: Family Medicine

## 2020-04-05 DIAGNOSIS — H04123 Dry eye syndrome of bilateral lacrimal glands: Secondary | ICD-10-CM | POA: Diagnosis not present

## 2020-04-05 DIAGNOSIS — H43811 Vitreous degeneration, right eye: Secondary | ICD-10-CM | POA: Diagnosis not present

## 2020-04-05 DIAGNOSIS — H2513 Age-related nuclear cataract, bilateral: Secondary | ICD-10-CM | POA: Diagnosis not present

## 2020-04-05 DIAGNOSIS — E119 Type 2 diabetes mellitus without complications: Secondary | ICD-10-CM | POA: Diagnosis not present

## 2020-04-05 DIAGNOSIS — H5210 Myopia, unspecified eye: Secondary | ICD-10-CM | POA: Diagnosis not present

## 2020-04-05 DIAGNOSIS — Z0100 Encounter for examination of eyes and vision without abnormal findings: Secondary | ICD-10-CM | POA: Diagnosis not present

## 2020-04-06 ENCOUNTER — Other Ambulatory Visit: Payer: Self-pay

## 2020-04-06 ENCOUNTER — Ambulatory Visit (INDEPENDENT_AMBULATORY_CARE_PROVIDER_SITE_OTHER): Payer: Medicare HMO | Admitting: *Deleted

## 2020-04-06 DIAGNOSIS — Z0000 Encounter for general adult medical examination without abnormal findings: Secondary | ICD-10-CM

## 2020-04-06 NOTE — Progress Notes (Signed)
MEDICARE ANNUAL WELLNESS VISIT  04/06/2020   Provider calling from [facility], patient participating in call from home.   Telephone Visit Disclaimer This Medicare AWV was conducted by telephone due to national recommendations for restrictions regarding the COVID-19 Pandemic (e.g. social distancing).  I verified, using two identifiers, that I am speaking with [name] or their authorized healthcare agent. I discussed the limitations, risks, security, and privacy concerns of performing an evaluation and management service by telephone and the potential availability of an in-person appointment in the future. The patient expressed understanding and agreed to proceed.   Subjective:  MICHELINE MARKES is a 64 y.o. female patient of [name], [name], MD who had a Medicare Annual Wellness Visit today via telephone. Danecia is disabled and lives with her husband Audry Pili. She has 2 sons. She reports that she is socially active and does interact with friends/family regularly. She is not physically active and enjoys crafting.   Patient Care Team: [name], [name], MD as PCP - General (Family Medicine) Satira Sark, MD as PCP - Cardiology (Cardiology) Gaynelle Arabian, MD as Surgeon (Orthopedic Surgery) Ellwood Dense., MD as Referring Physician (Neurology) Roseanne Kaufman, MD as Consulting Physician (Orthopedic Surgery) Rogene Houston, MD as Consulting Physician (Gastroenterology) Tanda Rockers, MD as Consulting Physician (Pulmonary Disease) Satira Sark, MD as Consulting Physician (Cardiology) Weber Cooks, MD as Referring Physician (Orthopedic Surgery) Ilean China, RN as Registered Nurse  Advanced Directives 04/06/2020 04/06/2019 03/29/2018 02/12/2017 06/14/2016 06/14/2016 08/02/2015  Does Patient Have a Medical Advance Directive? Yes No;Yes Yes Yes Yes Yes No;Yes  Type of Paramedic of [place];Living will;Out of facility DNR (pink MOST or yellow form)  Living will;Healthcare Power of [place];Living will Out of facility DNR (pink MOST or yellow form) [place];Living will [place];Living will;Out of facility DNR (pink MOST or yellow form) Living will  Does patient want to make changes to medical advance directive? No - Patient declined No - Patient declined No - Patient declined - No - Patient declined - -  Copy of [place] in Chart? Yes - validated most recent copy scanned in chart (See row information) No - copy requested No - copy requested - No - copy requested - -  Would patient like information on creating a medical advance directive? - No - Patient declined - - - - [facility] Utilization Over the Past 12 Months: # of hospitalizations or ER visits: 0 # of surgeries: 0  Review of Systems    Patient reports that her overall health is worse compared to last year.  History obtained from the patient and patient chart.   Patient Reported Readings (BP, Pulse, CBG, Weight, etc) none  Pain Assessment Pain : 0-10 Pain Score: 3  Pain Type: Chronic pain Pain Location:  (BILATERAL THUMB JOINTS) Pain Orientation:  (BILATERAL) Pain Descriptors / Indicators:  (CATCHING) Pain Onset: More than a month ago Pain Frequency: Intermittent Pain Relieving Factors: INJECTIONS Effect of Pain on Daily Activities: HARD TO OPEN THINGS  Pain Relieving Factors: INJECTIONS  Current Medications & Allergies (verified) Allergies as of 04/06/2020      Reactions   Fenofibrate Other (See Comments)   Flu-like    Penicillin G Other (See Comments)   Blisters in mouth   Pravastatin Other (See Comments)   "flu like symptoms"       Medication List  Accurate as of April 06, 2020  1:54 PM. If you have any questions, ask your nurse or doctor.        albuterol (2.5 MG/3ML) 0.083% nebulizer solution Commonly known as: PROVENTIL Take 3 mLs (2.5 mg total) by  nebulization every 6 (six) hours as needed for wheezing or shortness of breath.   albuterol 108 (90 Base) MCG/ACT inhaler Commonly known as: Ventolin HFA Inhale 2 puffs into the lungs every 6 (six) hours as needed. For shortness of breath   budesonide-formoterol 160-4.5 MCG/ACT inhaler Commonly known as: Symbicort Take 2 puffs first thing in am and then another 2 puffs about 12 hours later.   cholecalciferol 1000 units tablet Commonly known as: VITAMIN D Take 1,000 Units by mouth daily.   Cinnamon 500 MG capsule   citalopram 40 MG tablet Commonly known as: CELEXA TAKE ONE (1) TABLET EACH DAY   cycloSPORINE 0.05 % ophthalmic emulsion Commonly known as: RESTASIS 1 drop 2 (two) times daily.   folic acid 993 MCG tablet Commonly known as: FOLVITE Take 0.5 tablets (400 mcg total) by mouth daily.   furosemide 20 MG tablet Commonly known as: LASIX Take 1-2 tablets (20-40 mg total) by mouth daily. As needed   gabapentin 600 MG tablet Commonly known as: NEURONTIN Take 1 tablet (600 mg total) by mouth 3 (three) times daily.   Melatonin 10 MG Tabs Take 1 tablet by mouth at bedtime.   pantoprazole 40 MG tablet Commonly known as: PROTONIX TAKE ONE (1) TABLET EACH DAY   rosuvastatin 5 MG tablet Commonly known as: Crestor Take 1 tablet (5 mg total) by mouth daily.   tiotropium 18 MCG inhalation capsule Commonly known as: Spiriva HandiHaler Place 1 capsule (18 mcg total) into inhaler and inhale daily.   triamcinolone cream 0.1 % Commonly known as: KENALOG   vitamin C 100 MG tablet Take 100 mg by mouth daily.   Zinc 50 MG Tabs Take 25 mg by mouth daily.       History (reviewed): Past Medical History:  Diagnosis Date  . Anxiety   . Arthritis   . COPD (chronic obstructive pulmonary disease) ([place])   . GERD (gastroesophageal reflux disease)   . History of skin cancer   . Migraine   . Mixed hyperlipidemia   . Multiple sclerosis (HCC)    Left-sided weakness  .  Neuropathy   . Pre-diabetes   . Psoriasis   . Seasonal allergies    Past Surgical History:  Procedure Laterality Date  . ABDOMINAL HYSTERECTOMY    . CHOLECYSTECTOMY    . COLONOSCOPY N/A 08/02/2015   Procedure: COLONOSCOPY;  Surgeon: Rogene Houston, MD;  Location: [facility]ITE;  Service: Endoscopy;  Laterality: N/A;  12:00-moved to 1220 Ann to notify pt  . KNEE ARTHROSCOPY     RT  . LAPAROSCOPIC APPENDECTOMY N/A 02/12/2017   Procedure: APPENDECTOMY LAPAROSCOPIC;  Surgeon: Aviva Signs, MD;  Location: [facility] ORS;  Service: General;  Laterality: N/A;  . Left breast lumpectomy    . Left carpal tunnel release    . PARTIAL KNEE ARTHROPLASTY Left 01/22/2015   Procedure: LEFT KNEE MEDIAL UNICOMPARTMENTAL KNEE;  Surgeon: Gaynelle Arabian, MD;  Location: [facility] ORS;  Service: Orthopedics;  Laterality: Left;  . Right total knee replacement  2010  . SHOULDER SURGERY Left   . Sinus cyst removal    . TOTAL KNEE REVISION  05/05/2012   Procedure: TOTAL KNEE REVISION;  Surgeon: Gearlean Alf, MD;  Location: [facility] ORS;  Service: Orthopedics;  Laterality: Right;  . WRIST SURGERY     rt   Family History  Problem Relation Age of Onset  . Dementia Mother   . Thyroid disease Mother        goiter   . Diabetes Mother   . Atrial fibrillation Father        Died with pulmonary embolus  . Emphysema Father   . Heart disease Father   . Other Father        blood clots   . Celiac disease Father   . GI problems Father        dumping syndrome - malabsorbtion   . Cancer - Colon Maternal Grandmother   . Dementia Maternal Grandmother   . Heart disease Maternal Grandfather   . Stroke Maternal Grandfather   . Other Sister        hereditary = blood clots   . Parkinson's disease Brother   . Alcohol abuse Brother   . Arthritis Son   . Heart attack Paternal Grandmother   . Other Paternal Grandfather        appendix rupture  . Hypertension Brother   . Hypertension Son    Social History   Socioeconomic History  .  Marital status: Married    Spouse name: Audry Pili   . Number of children: 2  . Years of education: Not on file  . Highest education level: Some college, no degree  Occupational History  . Occupation: diabled    Comment: teller - [employer]  Tobacco Use  . Smoking status: Current Every Day Smoker    Packs/day: 0.50    Years: 40.00    Pack years: 20.00    Types: Cigarettes    Start date: 06/16/1972  . Smokeless tobacco: Never Used  . Tobacco comment: 3/4 pack a day sicne age 9  Vaping Use  . Vaping Use: Never used  Substance and Sexual Activity  . Alcohol use: No    Alcohol/week: 0.0 standard drinks  . Drug use: No  . Sexual activity: Not on file  Other Topics Concern  . Not on file  Social History Narrative   Disabled, married, lives with husband [place]. 2 children  - 9 grandchildren, 2 dogs   Social Determinants of Health   Financial Resource Strain:   . Difficulty of Paying Living Expenses: Not on file  Food Insecurity:   . Worried About Charity fundraiser in the Last Year: Not on file  . Ran Out of Food in the Last Year: Not on file  Transportation Needs:   . Lack of Transportation (Medical): Not on file  . Lack of Transportation (Non-Medical): Not on file  Physical Activity:   . Days of Exercise per Week: Not on file  . Minutes of Exercise per Session: Not on file  Stress:   . Feeling of Stress : Not on file  Social Connections:   . Frequency of Communication with Friends and Family: Not on file  . Frequency of Social Gatherings with Friends and Family: Not on file  . Attends Religious Services: Not on file  . Active Member of Clubs or Organizations: Not on file  . Attends Archivist Meetings: Not on file  . Marital Status: Not on file    Activities of Daily Living In your present state of health, do you have any difficulty performing the following activities: 04/06/2020  Hearing? N  Vision? N  Difficulty concentrating or making decisions? N   Walking or climbing stairs?  Y  Comment climbing stairs is hard on knees  Dressing or bathing? N  Doing errands, shopping? N  Preparing Food and eating ? N  Using the Toilet? N  In the past six months, have you accidently leaked urine? Y  Do you have problems with loss of bowel control? N  Managing your Medications? N  Managing your Finances? N  Housekeeping or managing your Housekeeping? N  Some recent data might be hidden    Patient Education/ Literacy How often do you need to have someone help you when you read instructions, pamphlets, or other written materials from your doctor or pharmacy?: 1 - Never What is the last grade level you completed in school?: some college  Exercise Current Exercise Habits: The patient does not participate in regular exercise at present, Exercise limited by: None identified  Diet Patient reports consuming 3 meals a day and 2 snack(s) a day Patient reports that her primary diet is: Regular Patient reports that she does have regular access to food.   Depression Screen PHQ 2/9 Scores 04/06/2020 02/06/2020 12/12/2019 12/02/2019 04/06/2019 01/24/2019 10/28/2018  PHQ - 2 Score 0 4 0 0 0 0 0  PHQ- 9 Score - 15 - - - - -     Fall Risk Fall Risk  04/06/2020 02/06/2020 12/12/2019 12/02/2019 04/06/2019  Falls in the past year? 0 0 0 0 0  Number falls in past yr: - - - - 0  Injury with Fall? - - - - 0  Comment - - - - -     Objective:  [name] seemed alert and oriented and she participated appropriately during our telephone visit.  Blood Pressure Weight BMI  BP Readings from Last 3 Encounters:  02/06/20 124/63  12/20/19 126/74  12/12/19 135/64   Wt Readings from Last 3 Encounters:  02/06/20 250 lb (113.4 kg)  12/20/19 249 lb (112.9 kg)  12/12/19 250 lb (113.4 kg)   BMI Readings from Last 1 Encounters:  02/06/20 35.87 kg/m    *Unable to obtain current vital signs, weight, and BMI due to telephone visit type  Hearing/Vision  . Jazmon did not  seem to have difficulty with hearing/understanding during the telephone conversation . Reports that she has had a formal eye exam by an eye care professional within the past year . Reports that she has not had a formal hearing evaluation within the past year *Unable to fully assess hearing and vision during telephone visit type  Cognitive Function: 6CIT Screen 04/06/2020 04/06/2019  What Year? 0 points 0 points  What month? 0 points 0 points  What time? 0 points 0 points  Count back from 20 0 points 0 points  Months in reverse 0 points 0 points  Repeat phrase 0 points 0 points  Total Score 0 0   (Normal:0-7, Significant for Dysfunction: >8)  Normal Cognitive Function Screening: Yes   Immunization & Health Maintenance Record Immunization History  Administered Date(s) Administered  . Influenza,inj,Quad PF,6+ Mos 06/06/2013  . Pneumococcal Conjugate-13 03/29/2018  . Pneumococcal Polysaccharide-23 09/12/2013  . Tdap 10/02/2014    Health Maintenance  Topic Date Due  . URINE MICROALBUMIN  07/29/2016  . MAMMOGRAM  04/18/2018  . PAP SMEAR-Modifier  09/17/2023 (Originally 06/17/1979)  . FOOT EXAM  04/27/2020  . HEMOGLOBIN A1C  08/07/2020  . OPHTHALMOLOGY EXAM  12/20/2020  . TETANUS/TDAP  10/02/2024  . COLONOSCOPY  08/01/2025  . PNEUMOCOCCAL POLYSACCHARIDE VACCINE AGE 38-64 HIGH RISK  Completed  . Hepatitis C Screening  Completed  . INFLUENZA VACCINE  Discontinued  . COLON CANCER SCREENING ANNUAL FOBT  Discontinued  . COVID-19 Vaccine  Discontinued  . HIV Screening  Discontinued       Assessment  This is a routine wellness examination for [name].  Health Maintenance: Due or Overdue Health Maintenance Due  Topic Date Due  . URINE MICROALBUMIN  07/29/2016  . MAMMOGRAM  04/18/2018    [name] does not need a referral for Community Assistance: Care Management:   no Social Work:    no Prescription Assistance:  no Nutrition/Diabetes Education:  no   Plan:   Personalized Goals Goals Addressed            This Visit's Progress   . Patient Stated       04/06/2020 AWV Goal: Exercise for General Health   Patient will verbalize understanding of the benefits of increased physical activity:  Exercising regularly is important. It will improve your overall fitness, flexibility, and endurance.  Regular exercise also will improve your overall health. It can help you control your weight, reduce stress, and improve your bone density.  Over the next year, patient will increase physical activity as tolerated with a goal of at least 150 minutes of moderate physical activity per week.   You can tell that you are exercising at a moderate intensity if your heart starts beating faster and you start breathing faster but can still hold a conversation.  Moderate-intensity exercise ideas include:  Walking 1 mile (1.6 km) in about 15 minutes  Biking  Hiking  Golfing  Dancing  Water aerobics  Patient will verbalize understanding of everyday activities that increase physical activity by providing examples like the following: ? Yard work, such as: ? Pushing a Conservation officer, nature ? Raking and bagging leaves ? Washing your car ? Pushing a stroller ? Shoveling snow ? Gardening ? Washing windows or floors  Patient will be able to explain general safety guidelines for exercising:   Before you start a new exercise program, talk with your health care provider.  Do not exercise so much that you hurt yourself, feel dizzy, or get very short of breath.  Wear comfortable clothes and wear shoes with good support.  Drink plenty of water while you exercise to prevent dehydration or heat stroke.  Work out until your breathing and your heartbeat get faster.       Personalized Health Maintenance & Screening Recommendations    Lung Cancer Screening Recommended: yes (Low Dose CT Chest recommended if Age 48-80 years, 30 pack-year currently smoking OR have quit  w/in past 15 years) Hepatitis C Screening recommended: no HIV Screening recommended: no  Advanced Directives: Written information was not prepared per patient's request.  Referrals & Orders No orders of the defined types were placed in this encounter.   Follow-up Plan . Follow-up with [name], [name], MD as planned   I have personally reviewed and noted the following in the patient's chart:   . Medical and social history . Use of alcohol, tobacco or illicit drugs  . Current medications and supplements . Functional ability and status . Nutritional status . Physical activity . Advanced directives . List of other physicians . Hospitalizations, surgeries, and ER visits in previous 12 months . Vitals . Screenings to include cognitive, depression, and falls . Referrals and appointments  In addition, I have reviewed and discussed with [name] certain preventive protocols, quality metrics, and best practice recommendations. A written personalized care plan  for preventive services as well as general preventive health recommendations is available and can be mailed to the patient at her request.     Baldomero Lamy, LPN [id number]

## 2020-04-09 ENCOUNTER — Ambulatory Visit (INDEPENDENT_AMBULATORY_CARE_PROVIDER_SITE_OTHER): Payer: Medicare HMO

## 2020-04-09 ENCOUNTER — Other Ambulatory Visit: Payer: Self-pay

## 2020-04-09 DIAGNOSIS — D518 Other vitamin B12 deficiency anemias: Secondary | ICD-10-CM | POA: Diagnosis not present

## 2020-04-09 NOTE — Progress Notes (Signed)
Cyanocobalamin injection given to left deltoid.  Patient tolerated well. 

## 2020-04-13 ENCOUNTER — Ambulatory Visit: Payer: Medicare HMO | Admitting: Family Medicine

## 2020-05-09 ENCOUNTER — Encounter: Payer: Self-pay | Admitting: Family Medicine

## 2020-05-09 ENCOUNTER — Other Ambulatory Visit: Payer: Self-pay

## 2020-05-09 ENCOUNTER — Ambulatory Visit (INDEPENDENT_AMBULATORY_CARE_PROVIDER_SITE_OTHER): Payer: Medicare HMO | Admitting: Family Medicine

## 2020-05-09 VITALS — BP 119/57 | HR 77 | Temp 98.1°F | Ht 70.0 in | Wt 240.0 lb

## 2020-05-09 DIAGNOSIS — K219 Gastro-esophageal reflux disease without esophagitis: Secondary | ICD-10-CM

## 2020-05-09 DIAGNOSIS — E1169 Type 2 diabetes mellitus with other specified complication: Secondary | ICD-10-CM | POA: Diagnosis not present

## 2020-05-09 DIAGNOSIS — E782 Mixed hyperlipidemia: Secondary | ICD-10-CM | POA: Diagnosis not present

## 2020-05-09 LAB — BAYER DCA HB A1C WAIVED: HB A1C (BAYER DCA - WAIVED): 6.6 % (ref <7.0)

## 2020-05-09 NOTE — Progress Notes (Signed)
BP (!) 119/57   Pulse 77   Temp 98.1 F (36.7 C)   Ht 5\' 10"  (1.778 m)   Wt 240 lb (108.9 kg)   SpO2 96%   BMI 34.44 kg/m    Subjective:   Patient ID: [name], female    DOB: 08/08/1956, 64 y.o.   MRN: [id number]  HPI: [name] is a 64 y.o. female presenting on 05/09/2020 for Medical Management of Chronic Issues, Hyperlipidemia, and Diabetes   HPI Type 2 diabetes mellitus Patient comes in today for recheck of his diabetes. Patient has been currently taking no medication currently and has been diet controlled. Patient is not currently on an ACE inhibitor/ARB. Patient has not seen an ophthalmologist this year. Patient denies any new issues with their feet. The symptom started onset as an adult hyperlipidemia and neuropathy ARE RELATED TO DM   Hyperlipidemia Patient is coming in for recheck of his hyperlipidemia. The patient is currently taking Crestor. They deny any issues with myalgias or history of liver damage from it. They deny any focal numbness or weakness or chest pain.   GERD Patient is currently on pantoprazole.  She denies any major symptoms or abdominal pain or belching or burping. She denies any blood in her stool or lightheadedness or dizziness.   Relevant past medical, surgical, family and social history reviewed and updated as indicated. Interim medical history since our last visit reviewed. Allergies and medications reviewed and updated.  Review of Systems  Constitutional: Negative for chills and fever.  Eyes: Negative for visual disturbance.  Respiratory: Negative for chest tightness and shortness of breath.   Cardiovascular: Negative for chest pain and leg swelling.  Musculoskeletal: Negative for back pain and gait problem.  Skin: Negative for rash.  Neurological: Negative for light-headedness and headaches.  Psychiatric/Behavioral: Negative for agitation and behavioral problems.  All other systems reviewed and are negative.   Per HPI unless  specifically indicated above   Allergies as of 05/09/2020      Reactions   Fenofibrate Other (See Comments)   Flu-like    Penicillin G Other (See Comments)   Blisters in mouth   Pravastatin Other (See Comments)   "flu like symptoms"       Medication List       Accurate as of May 09, 2020  3:41 PM. If you have any questions, ask your nurse or doctor.        albuterol (2.5 MG/3ML) 0.083% nebulizer solution Commonly known as: PROVENTIL Take 3 mLs (2.5 mg total) by nebulization every 6 (six) hours as needed for wheezing or shortness of breath.   albuterol 108 (90 Base) MCG/ACT inhaler Commonly known as: Ventolin HFA Inhale 2 puffs into the lungs every 6 (six) hours as needed. For shortness of breath   budesonide-formoterol 160-4.5 MCG/ACT inhaler Commonly known as: Symbicort Take 2 puffs first thing in am and then another 2 puffs about 12 hours later.   cholecalciferol 1000 units tablet Commonly known as: VITAMIN D Take 1,000 Units by mouth daily.   Cinnamon 500 MG capsule   citalopram 40 MG tablet Commonly known as: CELEXA TAKE ONE (1) TABLET EACH DAY   cycloSPORINE 0.05 % ophthalmic emulsion Commonly known as: RESTASIS 1 drop 2 (two) times daily.   folic acid 568 MCG tablet Commonly known as: FOLVITE Take 0.5 tablets (400 mcg total) by mouth daily.   furosemide 20 MG tablet Commonly known as: LASIX Take 1-2 tablets (20-40 mg total) by mouth daily.  As needed   gabapentin 600 MG tablet Commonly known as: NEURONTIN Take 1 tablet (600 mg total) by mouth 3 (three) times daily.   Melatonin 10 MG Tabs Take 1 tablet by mouth at bedtime.   pantoprazole 40 MG tablet Commonly known as: PROTONIX TAKE ONE (1) TABLET EACH DAY   rosuvastatin 5 MG tablet Commonly known as: Crestor Take 1 tablet (5 mg total) by mouth daily.   tiotropium 18 MCG inhalation capsule Commonly known as: Spiriva HandiHaler Place 1 capsule (18 mcg total) into inhaler and inhale  daily.   triamcinolone cream 0.1 % Commonly known as: KENALOG   vitamin C 100 MG tablet Take 100 mg by mouth daily.   Zinc 50 MG Tabs Take 25 mg by mouth daily.        Objective:   BP (!) 119/57   Pulse 77   Temp 98.1 F (36.7 C)   Ht 5\' 10"  (1.778 m)   Wt 240 lb (108.9 kg)   SpO2 96%   BMI 34.44 kg/m   Wt Readings from Last 3 Encounters:  05/09/20 240 lb (108.9 kg)  02/06/20 250 lb (113.4 kg)  12/20/19 249 lb (112.9 kg)    Physical Exam Vitals and nursing note reviewed.  Constitutional:      General: She is not in acute distress.    Appearance: She is well-developed. She is not diaphoretic.  Eyes:     Conjunctiva/sclera: Conjunctivae normal.  Cardiovascular:     Rate and Rhythm: Normal rate and regular rhythm.     Heart sounds: Normal heart sounds. No murmur heard.   Pulmonary:     Effort: Pulmonary effort is normal. No respiratory distress.     Breath sounds: Normal breath sounds. No wheezing.  Musculoskeletal:        General: No tenderness. Normal range of motion.  Skin:    General: Skin is warm and dry.     Findings: No rash.  Neurological:     Mental Status: She is alert and oriented to person, place, and time.     Coordination: Coordination normal.  Psychiatric:        Behavior: Behavior normal.     Results for orders placed or performed in visit on 05/09/20  Bayer DCA Hb A1c Waived  Result Value Ref Range   HB A1C (BAYER DCA - WAIVED) 6.6 <7.0 %    Assessment & Plan:   Problem List Items Addressed This Visit      Digestive   Gastroesophageal reflux disease without esophagitis     Endocrine   Type 2 diabetes mellitus with other specified complication ([place]) - Primary   Relevant Orders   Bayer DCA Hb A1c Waived (Completed)   Microalbumin / creatinine urine ratio     Other   Mixed hyperlipidemia      Continue current medication and no changes.  Follow up plan: Return in about 3 months (around 08/08/2020), or if symptoms worsen or  fail to improve, for diabetes of hld and gerd.  Counseling provided for all of the vaccine components Orders Placed This Encounter  Procedures  . Bayer DCA Hb A1c Waived  . Microalbumin / creatinine urine ratio    Caryl Pina, MD [facility] 05/09/2020, 3:41 PM

## 2020-05-10 LAB — MICROALBUMIN / CREATININE URINE RATIO
Creatinine, Urine: 224.4 mg/dL
Microalb/Creat Ratio: 14 mg/g creat (ref 0–29)
Microalbumin, Urine: 32.1 ug/mL

## 2020-05-11 ENCOUNTER — Ambulatory Visit (INDEPENDENT_AMBULATORY_CARE_PROVIDER_SITE_OTHER): Payer: Medicare HMO | Admitting: *Deleted

## 2020-05-11 ENCOUNTER — Other Ambulatory Visit: Payer: Self-pay

## 2020-05-11 DIAGNOSIS — D518 Other vitamin B12 deficiency anemias: Secondary | ICD-10-CM

## 2020-05-16 ENCOUNTER — Other Ambulatory Visit: Payer: Self-pay | Admitting: Family Medicine

## 2020-05-16 DIAGNOSIS — J449 Chronic obstructive pulmonary disease, unspecified: Secondary | ICD-10-CM

## 2020-06-12 ENCOUNTER — Ambulatory Visit (INDEPENDENT_AMBULATORY_CARE_PROVIDER_SITE_OTHER): Payer: Medicare HMO

## 2020-06-12 ENCOUNTER — Other Ambulatory Visit: Payer: Self-pay

## 2020-06-12 DIAGNOSIS — D518 Other vitamin B12 deficiency anemias: Secondary | ICD-10-CM

## 2020-06-12 NOTE — Progress Notes (Signed)
Pt tolerated B12 injection well. Next appt scheduled.

## 2020-06-14 ENCOUNTER — Other Ambulatory Visit: Payer: Self-pay | Admitting: Family Medicine

## 2020-06-14 DIAGNOSIS — F411 Generalized anxiety disorder: Secondary | ICD-10-CM

## 2020-06-20 ENCOUNTER — Telehealth: Payer: Medicare HMO | Admitting: *Deleted

## 2020-07-12 ENCOUNTER — Other Ambulatory Visit: Payer: Self-pay

## 2020-07-12 ENCOUNTER — Ambulatory Visit (INDEPENDENT_AMBULATORY_CARE_PROVIDER_SITE_OTHER): Payer: Medicare HMO

## 2020-07-12 DIAGNOSIS — D518 Other vitamin B12 deficiency anemias: Secondary | ICD-10-CM

## 2020-07-12 NOTE — Progress Notes (Signed)
Cyanocobalamin injection given to right deltoid.  Patient tolerated well. 

## 2020-07-13 ENCOUNTER — Other Ambulatory Visit: Payer: Self-pay | Admitting: Family

## 2020-07-26 ENCOUNTER — Other Ambulatory Visit: Payer: Self-pay

## 2020-07-26 ENCOUNTER — Encounter: Payer: Self-pay | Admitting: Family Medicine

## 2020-07-26 ENCOUNTER — Ambulatory Visit (INDEPENDENT_AMBULATORY_CARE_PROVIDER_SITE_OTHER): Payer: Medicare HMO | Admitting: Family Medicine

## 2020-07-26 VITALS — BP 118/61 | HR 77 | Temp 97.0°F | Ht 70.0 in | Wt 249.0 lb

## 2020-07-26 DIAGNOSIS — G629 Polyneuropathy, unspecified: Secondary | ICD-10-CM

## 2020-07-26 DIAGNOSIS — E782 Mixed hyperlipidemia: Secondary | ICD-10-CM | POA: Diagnosis not present

## 2020-07-26 DIAGNOSIS — J449 Chronic obstructive pulmonary disease, unspecified: Secondary | ICD-10-CM | POA: Diagnosis not present

## 2020-07-26 DIAGNOSIS — K641 Second degree hemorrhoids: Secondary | ICD-10-CM

## 2020-07-26 DIAGNOSIS — E1169 Type 2 diabetes mellitus with other specified complication: Secondary | ICD-10-CM | POA: Diagnosis not present

## 2020-07-26 DIAGNOSIS — F411 Generalized anxiety disorder: Secondary | ICD-10-CM | POA: Diagnosis not present

## 2020-07-26 DIAGNOSIS — K219 Gastro-esophageal reflux disease without esophagitis: Secondary | ICD-10-CM | POA: Diagnosis not present

## 2020-07-26 LAB — BAYER DCA HB A1C WAIVED: HB A1C (BAYER DCA - WAIVED): 7.4 % — ABNORMAL HIGH (ref <7.0)

## 2020-07-26 MED ORDER — GABAPENTIN 600 MG PO TABS: 600.0000 mg | ORAL_TABLET | Freq: Three times a day (TID) | ORAL | 3 refills | Status: DC

## 2020-07-26 MED ORDER — METFORMIN HCL ER 500 MG PO TB24
500.0000 mg | ORAL_TABLET | Freq: Two times a day (BID) | ORAL | 3 refills | Status: DC
Start: 2020-07-26 — End: 2021-04-25

## 2020-07-26 MED ORDER — ROSUVASTATIN CALCIUM 5 MG PO TABS: 5.0000 mg | ORAL_TABLET | Freq: Every day | ORAL | 3 refills | Status: DC

## 2020-07-26 MED ORDER — BUDESONIDE-FORMOTEROL FUMARATE 160-4.5 MCG/ACT IN AERO: INHALATION_SPRAY | RESPIRATORY_TRACT | 11 refills | Status: DC

## 2020-07-26 MED ORDER — CITALOPRAM HYDROBROMIDE 40 MG PO TABS: 40.0000 mg | ORAL_TABLET | Freq: Every day | ORAL | 3 refills | Status: DC

## 2020-07-26 MED ORDER — NYSTATIN 100000 UNIT/ML MT SUSP: 5.0000 mL | Freq: Four times a day (QID) | OROMUCOSAL | 0 refills | Status: DC

## 2020-07-26 MED ORDER — NITRO-BID 2 % TD OINT
0.5000 [in_us] | TOPICAL_OINTMENT | Freq: Two times a day (BID) | TRANSDERMAL | 0 refills | Status: DC | PRN
Start: 2020-07-26 — End: 2020-07-27

## 2020-07-26 MED ORDER — PANTOPRAZOLE SODIUM 40 MG PO TBEC: 40.0000 mg | DELAYED_RELEASE_TABLET | Freq: Every day | ORAL | 3 refills | Status: DC

## 2020-07-26 MED ORDER — SPIRIVA HANDIHALER 18 MCG IN CAPS: 18.0000 ug | ORAL_CAPSULE | Freq: Every day | RESPIRATORY_TRACT | 3 refills | Status: DC

## 2020-07-26 NOTE — Progress Notes (Signed)
BP 118/61   Pulse 77   Temp (!) 97 F (36.1 C)   Ht 5' 10"  (1.778 m)   Wt 249 lb (112.9 kg)   SpO2 98%   BMI 35.73 kg/m    Subjective:   Patient ID: [name], female    DOB: July 04, 1956, 64 y.o.   MRN: [id number]  HPI: [name] is a 64 y.o. female presenting on 07/26/2020 for Medical Management of Chronic Issues, Diabetes, and Hyperlipidemia   HPI Type 2 diabetes mellitus Patient comes in today for recheck of his diabetes. Patient has been currently taking no medication currently, A1c is 7.4 today. Patient is not currently on an ACE inhibitor/ARB. Patient has seen an ophthalmologist this year. Patient denies any issues with their feet. The symptom started onset as an adult hyperlipoidemia and GERD ARE RELATED TO DM   Hyperlipidemia Patient is coming in for recheck of his hyperlipidemia. The patient is currently taking Crestor. They deny any issues with myalgias or history of liver damage from it. They deny any focal numbness or weakness or chest pain.   GERD Patient is currently on pantoprazole.  She denies any major symptoms or abdominal pain or belching or burping. She denies any blood in her stool or lightheadedness or dizziness.   COPD Patient is coming in for COPD recheck today.  He is currently on Symbicort and albuterol and Spiriva.  He has a mild chronic cough but denies any major coughing spells or wheezing spells.  He has 0nighttime symptoms per week and 0daytime symptoms per week currently.   Relevant past medical, surgical, family and social history reviewed and updated as indicated. Interim medical history since our last visit reviewed. Allergies and medications reviewed and updated.  Review of Systems  Constitutional: Negative for chills and fever.  Eyes: Negative for visual disturbance.  Respiratory: Negative for cough, chest tightness, shortness of breath and wheezing.   Cardiovascular: Negative for chest pain and leg swelling.  Genitourinary:  Negative for difficulty urinating and dysuria.  Musculoskeletal: Negative for back pain and gait problem.  Skin: Negative for rash.  Neurological: Negative for light-headedness and headaches.  Psychiatric/Behavioral: Negative for agitation and behavioral problems.  All other systems reviewed and are negative.   Per HPI unless specifically indicated above   Allergies as of 07/26/2020      Reactions   Fenofibrate Other (See Comments)   Flu-like    Penicillin G Other (See Comments)   Blisters in mouth   Pravastatin Other (See Comments)   "flu like symptoms"       Medication List       Accurate as of July 26, 2020  4:03 PM. If you have any questions, ask your nurse or doctor.        albuterol (2.5 MG/3ML) 0.083% nebulizer solution Commonly known as: PROVENTIL Take 3 mLs (2.5 mg total) by nebulization every 6 (six) hours as needed for wheezing or shortness of breath.   albuterol 108 (90 Base) MCG/ACT inhaler Commonly known as: Ventolin HFA Inhale 2 puffs into the lungs every 6 (six) hours as needed. For shortness of breath   budesonide-formoterol 160-4.5 MCG/ACT inhaler Commonly known as: Symbicort Take 2 puffs first thing in am and then another 2 puffs about 12 hours later.   cholecalciferol 1000 units tablet Commonly known as: VITAMIN D Take 1,000 Units by mouth daily.   Cinnamon 500 MG capsule   citalopram 40 MG tablet Commonly known as: CELEXA Take 1 tablet (40 mg  total) by mouth daily. What changed: See the new instructions. Changed by: [name] Shahidah Nesbitt, MD   cycloSPORINE 0.05 % ophthalmic emulsion Commonly known as: RESTASIS 1 drop 2 (two) times daily.   folic acid 644 MCG tablet Commonly known as: FOLVITE Take 0.5 tablets (400 mcg total) by mouth daily.   furosemide 20 MG tablet Commonly known as: LASIX Take 1-2 tablets (20-40 mg total) by mouth daily. As needed   gabapentin 600 MG tablet Commonly known as: NEURONTIN Take 1 tablet (600 mg  total) by mouth 3 (three) times daily.   Melatonin 10 MG Tabs Take 1 tablet by mouth at bedtime.   metFORMIN 500 MG 24 hr tablet Commonly known as: Glucophage XR Take 1 tablet (500 mg total) by mouth in the morning and at bedtime. Started by: [name] Raywood Wailes, MD   Nitro-Bid 2 % ointment Generic drug: nitroGLYCERIN Apply 0.5 inches topically 2 (two) times daily as needed (hemorrhoid). Started by: [name] Rosellen Lichtenberger, MD   pantoprazole 40 MG tablet Commonly known as: PROTONIX Take 1 tablet (40 mg total) by mouth daily. What changed: See the new instructions. Changed by: [name] Veryl Abril, MD   rosuvastatin 5 MG tablet Commonly known as: Crestor Take 1 tablet (5 mg total) by mouth daily.   Spiriva HandiHaler 18 MCG inhalation capsule Generic drug: tiotropium Place 1 capsule (18 mcg total) into inhaler and inhale daily. What changed: See the new instructions. Changed by: [name] Jalyne Brodzinski, MD   triamcinolone 0.1 % Commonly known as: KENALOG   vitamin C 100 MG tablet Take 100 mg by mouth daily.   Zinc 50 MG Tabs Take 25 mg by mouth daily.        Objective:   BP 118/61   Pulse 77   Temp (!) 97 F (36.1 C)   Ht 5' 10"  (1.778 m)   Wt 249 lb (112.9 kg)   SpO2 98%   BMI 35.73 kg/m   Wt Readings from Last 3 Encounters:  07/26/20 249 lb (112.9 kg)  05/09/20 240 lb (108.9 kg)  02/06/20 250 lb (113.4 kg)    Physical Exam Vitals and nursing note reviewed.  Constitutional:      General: She is not in acute distress.    Appearance: She is well-developed and well-nourished. She is not diaphoretic.  Eyes:     Extraocular Movements: EOM normal.     Conjunctiva/sclera: Conjunctivae normal.  Cardiovascular:     Rate and Rhythm: Normal rate and regular rhythm.     Pulses: Intact distal pulses.     Heart sounds: Normal heart sounds. No murmur heard.   Pulmonary:     Effort: Pulmonary effort is normal. No respiratory distress.     Breath sounds: Normal breath  sounds. No wheezing.  Musculoskeletal:        General: No edema.  Skin:    General: Skin is warm and dry.     Findings: No rash.  Neurological:     Mental Status: She is alert and oriented to person, place, and time.     Coordination: Coordination normal.  Psychiatric:        Mood and Affect: Mood and affect normal.        Behavior: Behavior normal.     A1c is 7.4 which is up currently.  Assessment & Plan:   Problem List Items Addressed This Visit      Respiratory   COPD  GOLD II/ AB   Relevant Medications   budesonide-formoterol (SYMBICORT)  160-4.5 MCG/ACT inhaler   tiotropium (SPIRIVA HANDIHALER) 18 MCG inhalation capsule   Other Relevant Orders   CBC with Differential/Platelet   CMP14+EGFR     Digestive   Gastroesophageal reflux disease without esophagitis   Relevant Medications   pantoprazole (PROTONIX) 40 MG tablet   Other Relevant Orders   CBC with Differential/Platelet     Endocrine   Type 2 diabetes mellitus with other specified complication (HCC) - Primary   Relevant Medications   rosuvastatin (CRESTOR) 5 MG tablet   metFORMIN (GLUCOPHAGE XR) 500 MG 24 hr tablet   Other Relevant Orders   Bayer DCA Hb A1c Waived   CBC with Differential/Platelet     Other   Mixed hyperlipidemia   Relevant Medications   rosuvastatin (CRESTOR) 5 MG tablet   nitroGLYCERIN (NITRO-BID) 2 % ointment   Other Relevant Orders   Lipid panel   Generalized anxiety disorder   Relevant Medications   citalopram (CELEXA) 40 MG tablet    Other Visit Diagnoses    Neuropathy       Relevant Medications   gabapentin (NEURONTIN) 600 MG tablet   Grade II hemorrhoids       Relevant Medications   rosuvastatin (CRESTOR) 5 MG tablet   nitroGLYCERIN (NITRO-BID) 2 % ointment      Will start Metformin because A1c is up at 7.4, recommend diet to get it back down so we could back off the Metformin in the future, recommended for her to try the Nitropaste rectally if we can get it covered by  her insurance. Follow up plan: Return in about 3 months (around 10/24/2020), or if symptoms worsen or fail to improve, for depression recheck.  Counseling provided for all of the vaccine components Orders Placed This Encounter  Procedures  . Bayer DCA Hb A1c Waived  . CBC with Differential/Platelet  . CMP14+EGFR  . Lipid panel    Caryl Pina, MD [facility] 07/26/2020, 4:03 PM

## 2020-07-27 ENCOUNTER — Telehealth: Payer: Self-pay | Admitting: *Deleted

## 2020-07-27 DIAGNOSIS — K641 Second degree hemorrhoids: Secondary | ICD-10-CM

## 2020-07-27 LAB — CBC WITH DIFFERENTIAL/PLATELET
Basophils Absolute: 0 10*3/uL (ref 0.0–0.2)
Basos: 0 %
EOS (ABSOLUTE): 0.2 10*3/uL (ref 0.0–0.4)
Eos: 2 %
Hematocrit: 38.1 % (ref 34.0–46.6)
Hemoglobin: 12.4 g/dL (ref 11.1–15.9)
Immature Grans (Abs): 0 10*3/uL (ref 0.0–0.1)
Immature Granulocytes: 0 %
Lymphocytes Absolute: 2.9 10*3/uL (ref 0.7–3.1)
Lymphs: 30 %
MCH: 28.8 pg (ref 26.6–33.0)
MCHC: 32.5 g/dL (ref 31.5–35.7)
MCV: 88 fL (ref 79–97)
Monocytes Absolute: 0.5 10*3/uL (ref 0.1–0.9)
Monocytes: 6 %
Neutrophils Absolute: 5.9 10*3/uL (ref 1.4–7.0)
Neutrophils: 62 %
Platelets: 267 10*3/uL (ref 150–450)
RBC: 4.31 x10E6/uL (ref 3.77–5.28)
RDW: 12.4 % (ref 11.7–15.4)
WBC: 9.7 10*3/uL (ref 3.4–10.8)

## 2020-07-27 LAB — LIPID PANEL
Chol/HDL Ratio: 3.8 ratio (ref 0.0–4.4)
Cholesterol, Total: 192 mg/dL (ref 100–199)
HDL: 51 mg/dL (ref >39)
LDL Chol Calc (NIH): 115 mg/dL — ABNORMAL HIGH (ref 0–99)
Triglycerides: 147 mg/dL (ref 0–149)
VLDL Cholesterol Cal: 26 mg/dL (ref 5–40)

## 2020-07-27 LAB — CMP14+EGFR
ALT: 14 IU/L (ref 0–32)
AST: 10 IU/L (ref 0–40)
Albumin/Globulin Ratio: 1.4 (ref 1.2–2.2)
Albumin: 3.5 g/dL — ABNORMAL LOW (ref 3.8–4.8)
Alkaline Phosphatase: 89 IU/L (ref 44–121)
BUN/Creatinine Ratio: 15 (ref 12–28)
BUN: 10 mg/dL (ref 8–27)
Bilirubin Total: 0.2 mg/dL (ref 0.0–1.2)
CO2: 28 mmol/L (ref 20–29)
Calcium: 9.1 mg/dL (ref 8.7–10.3)
Chloride: 100 mmol/L (ref 96–106)
Creatinine, Ser: 0.67 mg/dL (ref 0.57–1.00)
GFR calc Af Amer: 109 mL/min/{1.73_m2} (ref >59)
GFR calc non Af Amer: 95 mL/min/{1.73_m2} (ref >59)
Globulin, Total: 2.5 g/dL (ref 1.5–4.5)
Glucose: 147 mg/dL — ABNORMAL HIGH (ref 65–99)
Potassium: 4 mmol/L (ref 3.5–5.2)
Sodium: 142 mmol/L (ref 134–144)
Total Protein: 6 g/dL (ref 6.0–8.5)

## 2020-07-27 MED ORDER — HYDROCORT-PRAMOXINE (PERIANAL) 1-1 % EX FOAM: 1.0000 | Freq: Two times a day (BID) | CUTANEOUS | 1 refills | Status: DC

## 2020-07-27 MED ORDER — LIDOCAINE-HYDROCORTISONE ACE 3-2.5 % RE KIT: 1.0000 "application " | PACK | Freq: Every day | RECTAL | 1 refills | Status: DC | PRN

## 2020-07-27 NOTE — Telephone Encounter (Signed)
PA came in for NITRO-Bid 2% ointment  When I attempted PA  This was response:  The pharmacy claim for Nitro-Bid 2% ointment has been rejected by insurance.  Please send something else, no recommendations given.

## 2020-07-27 NOTE — Telephone Encounter (Signed)
lmtcb

## 2020-07-27 NOTE — Telephone Encounter (Signed)
I sent in 2 different kinds of creams and we will see if either one of them are covered.

## 2020-07-27 NOTE — Telephone Encounter (Signed)
Pt returned missed call. Explained to pt that Dr [name] sent in 2 different creams for her, to see if her insurance would cover either one, since her Insurance was requiring PA for the Nitroglycerin.   Pt says she paid/picked up the Nitroglycerin Rx earlier today and paid about $45 for it.

## 2020-07-30 ENCOUNTER — Telehealth: Payer: Self-pay

## 2020-07-30 NOTE — Telephone Encounter (Signed)
Patient's insurance will not cover Proctofoam.  Covered alternatives are Procto-Pak or Procto-Med HC.  Do you want to change to one of these or would you like [place] to do a prior authorization?

## 2020-07-30 NOTE — Telephone Encounter (Signed)
I sent 2 medications for her, one was the Proctofoam but the other one was the lidocaine-hydrocortisone, do we know if that one got covered?  If that one got covered then we can just hold off on the Proctofoam.

## 2020-07-31 NOTE — Telephone Encounter (Signed)
Patient aware and verbalizes understanding. 

## 2020-08-14 ENCOUNTER — Ambulatory Visit: Payer: Medicare HMO

## 2020-08-21 ENCOUNTER — Ambulatory Visit (INDEPENDENT_AMBULATORY_CARE_PROVIDER_SITE_OTHER): Payer: Medicare HMO

## 2020-08-21 ENCOUNTER — Other Ambulatory Visit: Payer: Self-pay

## 2020-08-21 DIAGNOSIS — D518 Other vitamin B12 deficiency anemias: Secondary | ICD-10-CM

## 2020-08-21 NOTE — Progress Notes (Signed)
Cyanocobalamin injection given to left deltoid.  Patient tolerated well. 

## 2020-09-18 ENCOUNTER — Encounter: Payer: Self-pay | Admitting: Nurse Practitioner

## 2020-09-18 ENCOUNTER — Ambulatory Visit (INDEPENDENT_AMBULATORY_CARE_PROVIDER_SITE_OTHER): Payer: Medicare HMO | Admitting: Nurse Practitioner

## 2020-09-18 VITALS — Temp 99.1°F

## 2020-09-18 DIAGNOSIS — J029 Acute pharyngitis, unspecified: Secondary | ICD-10-CM | POA: Diagnosis not present

## 2020-09-18 DIAGNOSIS — U071 COVID-19: Secondary | ICD-10-CM | POA: Insufficient documentation

## 2020-09-18 MED ORDER — ONDANSETRON HCL 4 MG PO TABS: 4.0000 mg | ORAL_TABLET | Freq: Three times a day (TID) | ORAL | 0 refills | Status: DC | PRN

## 2020-09-18 MED ORDER — DM-GUAIFENESIN ER 30-600 MG PO TB12: 1.0000 | ORAL_TABLET | Freq: Two times a day (BID) | ORAL | 0 refills | Status: DC

## 2020-09-18 MED ORDER — PREDNISONE 10 MG (21) PO TBPK
ORAL_TABLET | ORAL | 0 refills | Status: DC
Start: 2020-09-18 — End: 2020-10-24

## 2020-09-18 MED ORDER — AZITHROMYCIN 250 MG PO TABS: ORAL_TABLET | ORAL | 0 refills | Status: DC

## 2020-09-18 NOTE — Progress Notes (Signed)
   Virtual Visit via telephone Note Due to COVID-19 pandemic this visit was conducted virtually. This visit type was conducted due to national recommendations for restrictions regarding the COVID-19 Pandemic (e.g. social distancing, sheltering in place) in an effort to limit this patient's exposure and mitigate transmission in our community. All issues noted in this document were discussed and addressed.  A physical exam was not performed with this format.  I connected with [name] on 09/18/20 at  8:20 AM by telephone and verified that I am speaking with the correct person using two identifiers. [name] is currently located at home during visit. The provider, Ivy Lynn, NP is located in their office at time of visit.  I discussed the limitations, risks, security and privacy concerns of performing an evaluation and management service by telephone and the availability of in person appointments. I also discussed with the patient that there may be a patient responsible charge related to this service. The patient expressed understanding and agreed to proceed.   History and Present Illness:  Sore Throat  This is a new problem. The current episode started yesterday. The problem has been gradually worsening. Neither side of throat is experiencing more pain than the other. Maximum temperature: 99.1. The fever has been present for less than 1 day. The pain is moderate. Associated symptoms include congestion, coughing, headaches and shortness of breath. Associated symptoms comments: Body ache, nausea. She has had no exposure to strep. Exposure to: Positive for COVID-19. She has tried nothing for the symptoms.      Review of Systems  Constitutional: Positive for chills, fever and malaise/fatigue.  HENT: Positive for congestion.   Respiratory: Positive for cough and shortness of breath.   Gastrointestinal: Positive for nausea.  Skin: Negative for rash.  Neurological: Positive for  headaches.  All other systems reviewed and are negative.    Observations/Objective: Televisit.  Patient did not sound to be in distress  Assessment and Plan:Pharyngitis Recent positive COVID-19.  Patient is reporting headache, fever of 99.1, vomiting, body ache, and  loss of taste.  Started patient on Z-Pak, Mucinex for cough and congestion, continue Tylenol for headache and fever, referral to infusion clinic. Increase hydration, monitor oxygen saturation.  Advised patient to seek emergency care with saturations below 91%.  Patient verbalized understanding  Rx sent to pharmacy  Follow-up with worsening or unresolved symptoms.   Follow Up Instructions: Follow-up with worsening or unresolved symptoms    I discussed the assessment and treatment plan with the patient. The patient was provided an opportunity to ask questions and all were answered. The patient agreed with the plan and demonstrated an understanding of the instructions.   The patient was advised to call back or seek an in-person evaluation if the symptoms worsen or if the condition fails to improve as anticipated.  The above assessment and management plan was discussed with the patient. The patient verbalized understanding of and has agreed to the management plan. Patient is aware to call the clinic if symptoms persist or worsen. Patient is aware when to return to the clinic for a follow-up visit. Patient educated on when it is appropriate to go to the emergency department.   Time call ended: 8:30 AM  I provided 10 minutes of non-face-to-face time during this encounter.    Ivy Lynn, NP

## 2020-09-18 NOTE — Assessment & Plan Note (Signed)
Recent positive COVID-19.  Patient is reporting headache, fever of 99.1, vomiting, body ache, and  loss of taste.  Started patient on Z-Pak, Mucinex for cough and congestion, continue Tylenol for headache and fever, referral to infusion clinic. Increase hydration, monitor oxygen saturation.  Advised patient to seek emergency care with saturations below 91%.  Patient verbalized understanding  Rx sent to pharmacy  Follow-up with worsening or unresolved symptoms.

## 2020-09-19 ENCOUNTER — Telehealth: Payer: Self-pay | Admitting: Oncology

## 2020-09-19 ENCOUNTER — Encounter: Payer: Self-pay | Admitting: Oncology

## 2020-09-19 NOTE — Telephone Encounter (Signed)
Called to discuss with patient about COVID-19 symptoms and the use of one of the available treatments for those with mild to moderate Covid symptoms and at a high risk of hospitalization.  Pt appears to qualify for outpatient treatment due to co-morbid conditions and/or a member of an at-risk group in accordance with the FDA Emergency Use Authorization.    Symptom onset: 09/17/20 Vaccinated: Unusre Booster? unsure Immunocompromised? no Qualifiers:  Past Medical History:  Diagnosis Date  . Anxiety   . Arthritis   . COPD (chronic obstructive pulmonary disease) ([place])   . GERD (gastroesophageal reflux disease)   . History of skin cancer   . Migraine   . Mixed hyperlipidemia   . Multiple sclerosis (HCC)    Left-sided weakness  . Neuropathy   . Pre-diabetes   . Psoriasis   . Seasonal allergies    Unable to reach pt - Left VM and MCM   Jacquelin Hawking

## 2020-09-20 ENCOUNTER — Telehealth: Payer: Self-pay | Admitting: Family

## 2020-09-20 NOTE — Telephone Encounter (Signed)
Called to discuss with patient about COVID-19 symptoms and the use of one of the available treatments for those with mild to moderate Covid symptoms and at a high risk of hospitalization.  Pt appears to qualify for outpatient treatment due to co-morbid conditions and/or a member of an at-risk group in accordance with the FDA Emergency Use Authorization.    Symptom onset: 09/17/20 Vaccinated: No  Booster? No Immunocompromised? No Qualifiers: Type 2 diabetes, hyperlipidemia, COPD, MS   I spoke with [name] who is feeling better today and feeling like she had a head cold. Discussed that she is at increased risk for worsening symptoms based on her multiple medical conditions and being unvaccinated. She wishes to continue with symptom management at this time. MyChart message sent with call back number and [place].   Terri Piedra, NP 09/20/2020 2:03 PM

## 2020-09-21 ENCOUNTER — Ambulatory Visit: Payer: Medicare HMO

## 2020-09-24 ENCOUNTER — Ambulatory Visit: Payer: Medicare HMO

## 2020-09-28 ENCOUNTER — Other Ambulatory Visit: Payer: Self-pay

## 2020-09-28 ENCOUNTER — Ambulatory Visit (INDEPENDENT_AMBULATORY_CARE_PROVIDER_SITE_OTHER): Payer: Medicare HMO | Admitting: *Deleted

## 2020-09-28 DIAGNOSIS — D518 Other vitamin B12 deficiency anemias: Secondary | ICD-10-CM | POA: Diagnosis not present

## 2020-09-28 NOTE — Progress Notes (Signed)
Vitamin b12 injection given and patient tolerated well.  

## 2020-10-24 ENCOUNTER — Other Ambulatory Visit: Payer: Self-pay

## 2020-10-24 ENCOUNTER — Ambulatory Visit (INDEPENDENT_AMBULATORY_CARE_PROVIDER_SITE_OTHER): Payer: Medicare HMO | Admitting: Family Medicine

## 2020-10-24 ENCOUNTER — Encounter: Payer: Self-pay | Admitting: Family Medicine

## 2020-10-24 VITALS — BP 113/59 | HR 84 | Ht 70.0 in | Wt 248.0 lb

## 2020-10-24 DIAGNOSIS — F411 Generalized anxiety disorder: Secondary | ICD-10-CM

## 2020-10-24 DIAGNOSIS — E1169 Type 2 diabetes mellitus with other specified complication: Secondary | ICD-10-CM | POA: Diagnosis not present

## 2020-10-24 DIAGNOSIS — D518 Other vitamin B12 deficiency anemias: Secondary | ICD-10-CM | POA: Diagnosis not present

## 2020-10-24 DIAGNOSIS — F339 Major depressive disorder, recurrent, unspecified: Secondary | ICD-10-CM

## 2020-10-24 DIAGNOSIS — K219 Gastro-esophageal reflux disease without esophagitis: Secondary | ICD-10-CM

## 2020-10-24 DIAGNOSIS — E782 Mixed hyperlipidemia: Secondary | ICD-10-CM

## 2020-10-24 LAB — BAYER DCA HB A1C WAIVED: HB A1C (BAYER DCA - WAIVED): 7 % — ABNORMAL HIGH (ref <7.0)

## 2020-10-24 NOTE — Progress Notes (Unsigned)
BP (!) 113/59   Pulse 84   Ht 5' 10" (1.778 m)   Wt 248 lb (112.5 kg)   SpO2 96%   BMI 35.58 kg/m    Subjective:   Patient ID: [name], female    DOB: 04/04/1956, 65 y.o.   MRN: [id number]  HPI: [name] is a 65 y.o. female presenting on 10/24/2020 for Medical Management of Chronic Issues, Diabetes, Hyperlipidemia, and Depression   HPI Type 2 diabetes mellitus Patient comes in today for recheck of his diabetes. Patient has been currently taking Metformin. Patient is not currently on an ACE inhibitor/ARB. Patient has not seen an ophthalmologist this year. Patient denies any issues with their feet. The symptom started onset as an adult hyperlipidemia ARE RELATED TO DM   Hyperlipidemia Patient is coming in for recheck of his hyperlipidemia. The patient is currently taking Crestor. They deny any issues with myalgias or history of liver damage from it. They deny any focal numbness or weakness or chest pain.   GERD Patient is currently on pantoprazole.  She denies any major symptoms or abdominal pain or belching or burping. She denies any blood in her stool or lightheadedness or dizziness.   Depression and anxiety recheck Patient is coming in today for depression and anxiety recheck.  She just recently lost her sister due to liver disease and cirrhosis.  She is currently taking citalopram.  She feels like things are going in normal for the amount of grief that she would be expected and does not feel overwhelmed and is content with the medication and how it is helping her.  She is saddened by the loss of her sister Depression screen [facility] 2/9 10/24/2020 10/24/2020 07/26/2020 05/09/2020 04/06/2020  Decreased Interest 0 0 0 0 0  Down, Depressed, Hopeless 3 0 0 0 0  PHQ - 2 Score 3 0 0 0 0  Altered sleeping 3 - - - -  Tired, decreased energy 3 - - - -  Change in appetite 3 - - - -  Feeling bad or failure about yourself  1 - - - -  Trouble concentrating 1 - - - -  Moving slowly or  fidgety/restless 1 - - - -  Suicidal thoughts 0 - - - -  PHQ-9 Score 15 - - - -  Some recent data might be hidden     Relevant past medical, surgical, family and social history reviewed and updated as indicated. Interim medical history since our last visit reviewed. Allergies and medications reviewed and updated.  Review of Systems  Constitutional: Negative for chills and fever.  Eyes: Negative for visual disturbance.  Respiratory: Negative for chest tightness and shortness of breath.   Cardiovascular: Negative for chest pain and leg swelling.  Musculoskeletal: Negative for back pain and gait problem.  Skin: Negative for rash.  Neurological: Negative for light-headedness and headaches.  Psychiatric/Behavioral: Positive for dysphoric mood. Negative for agitation, behavioral problems, self-injury, sleep disturbance and suicidal ideas. The patient is not nervous/anxious.   All other systems reviewed and are negative.   Per HPI unless specifically indicated above   Allergies as of 10/24/2020      Reactions   Fenofibrate Other (See Comments)   Flu-like    Penicillin G Other (See Comments)   Blisters in mouth   Pravastatin Other (See Comments)   "flu like symptoms"       Medication List       Accurate as of October 24, 2020 11:59 PM.  If you have any questions, ask your nurse or doctor.        STOP taking these medications   predniSONE 10 MG (21) Tbpk tablet Commonly known as: STERAPRED UNI-PAK 21 TAB Stopped by: [name] [name], MD     TAKE these medications   albuterol (2.5 MG/3ML) 0.083% nebulizer solution Commonly known as: PROVENTIL Take 3 mLs (2.5 mg total) by nebulization every 6 (six) hours as needed for wheezing or shortness of breath.   albuterol 108 (90 Base) MCG/ACT inhaler Commonly known as: Ventolin HFA Inhale 2 puffs into the lungs every 6 (six) hours as needed. For shortness of breath   azithromycin 250 MG tablet Commonly known as: Zithromax 2  tablets [500 mg] day 1, 1 tablet [250 mg] day 2-5   budesonide-formoterol 160-4.5 MCG/ACT inhaler Commonly known as: Symbicort Take 2 puffs first thing in am and then another 2 puffs about 12 hours later.   cholecalciferol 1000 units tablet Commonly known as: VITAMIN D Take 1,000 Units by mouth daily.   Cinnamon 500 MG capsule   citalopram 40 MG tablet Commonly known as: CELEXA Take 1 tablet (40 mg total) by mouth daily.   cycloSPORINE 0.05 % ophthalmic emulsion Commonly known as: RESTASIS 1 drop 2 (two) times daily.   dextromethorphan-guaiFENesin 30-600 MG 12hr tablet Commonly known as: MUCINEX DM Take 1 tablet by mouth 2 (two) times daily.   folic acid 419 MCG tablet Commonly known as: FOLVITE Take 0.5 tablets (400 mcg total) by mouth daily.   furosemide 20 MG tablet Commonly known as: LASIX Take 1-2 tablets (20-40 mg total) by mouth daily. As needed   gabapentin 600 MG tablet Commonly known as: NEURONTIN Take 1 tablet (600 mg total) by mouth 3 (three) times daily.   hydrocortisone-pramoxine rectal foam Commonly known as: PROCTOFOAM-HC Place 1 applicator rectally 2 (two) times daily. don't use for more than 7 days.   Lidocaine-Hydrocortisone Ace 3-2.5 % Kit Place 1 application rectally daily as needed.   Melatonin 10 MG Tabs Take 1 tablet by mouth at bedtime.   metFORMIN 500 MG 24 hr tablet Commonly known as: Glucophage XR Take 1 tablet (500 mg total) by mouth in the morning and at bedtime.   nystatin 100000 UNIT/ML suspension Commonly known as: MYCOSTATIN Take 5 mLs (500,000 Units total) by mouth 4 (four) times daily.   ondansetron 4 MG tablet Commonly known as: Zofran Take 1 tablet (4 mg total) by mouth every 8 (eight) hours as needed for nausea or vomiting.   pantoprazole 40 MG tablet Commonly known as: PROTONIX Take 1 tablet (40 mg total) by mouth daily.   rosuvastatin 5 MG tablet Commonly known as: Crestor Take 1 tablet (5 mg total) by mouth  daily.   Spiriva HandiHaler 18 MCG inhalation capsule Generic drug: tiotropium Place 1 capsule (18 mcg total) into inhaler and inhale daily.   triamcinolone 0.1 % Commonly known as: KENALOG   vitamin C 100 MG tablet Take 100 mg by mouth daily.   Zinc 50 MG Tabs Take 25 mg by mouth daily.        Objective:   BP (!) 113/59   Pulse 84   Ht 5' 10" (1.778 m)   Wt 248 lb (112.5 kg)   SpO2 96%   BMI 35.58 kg/m   Wt Readings from Last 3 Encounters:  10/24/20 248 lb (112.5 kg)  07/26/20 249 lb (112.9 kg)  05/09/20 240 lb (108.9 kg)    Physical Exam Vitals and nursing note  reviewed.  Constitutional:      General: She is not in acute distress.    Appearance: She is well-developed. She is not diaphoretic.  Eyes:     Conjunctiva/sclera: Conjunctivae normal.  Cardiovascular:     Rate and Rhythm: Normal rate and regular rhythm.     Heart sounds: Normal heart sounds. No murmur heard.   Pulmonary:     Effort: Pulmonary effort is normal. No respiratory distress.     Breath sounds: Normal breath sounds. No wheezing.  Musculoskeletal:        General: No tenderness. Normal range of motion.  Skin:    General: Skin is warm and dry.     Findings: No rash.  Neurological:     Mental Status: She is alert and oriented to person, place, and time.     Coordination: Coordination normal.  Psychiatric:        Mood and Affect: Mood is depressed. Mood is not anxious.        Behavior: Behavior normal.        Thought Content: Thought content does not include suicidal ideation. Thought content does not include suicidal plan.       Assessment & Plan:   Problem List Items Addressed This Visit      Digestive   Gastroesophageal reflux disease without esophagitis     Endocrine   Type 2 diabetes mellitus with other specified complication ([place]) - Primary   Relevant Orders   Bayer DCA Hb A1c Waived (Completed)   CMP14+EGFR (Completed)   CBC with Differential/Platelet (Completed)      Other   Mixed hyperlipidemia   Generalized anxiety disorder    Other Visit Diagnoses    Depression, recurrent ([place])          Continue current medication, will check blood work today  A1c is 7.0, up slightly.  No change in medication just focus on diet. Follow up plan: Return in about 3 months (around 01/24/2021), or if symptoms worsen or fail to improve, for Depression and GERD and diabetes.  Counseling provided for all of the vaccine components Orders Placed This Encounter  Procedures  . Bayer DCA Hb A1c Waived  . CMP14+EGFR  . CBC with Differential/Platelet    Caryl Pina, MD [facility] 10/25/2020, 8:00 AM

## 2020-10-25 LAB — CBC WITH DIFFERENTIAL/PLATELET
Basophils Absolute: 0.1 10*3/uL (ref 0.0–0.2)
Basos: 1 %
EOS (ABSOLUTE): 0.1 10*3/uL (ref 0.0–0.4)
Eos: 1 %
Hematocrit: 40.5 % (ref 34.0–46.6)
Hemoglobin: 13.4 g/dL (ref 11.1–15.9)
Immature Grans (Abs): 0 10*3/uL (ref 0.0–0.1)
Immature Granulocytes: 0 %
Lymphocytes Absolute: 3.2 10*3/uL — ABNORMAL HIGH (ref 0.7–3.1)
Lymphs: 28 %
MCH: 29.3 pg (ref 26.6–33.0)
MCHC: 33.1 g/dL (ref 31.5–35.7)
MCV: 89 fL (ref 79–97)
Monocytes Absolute: 0.6 10*3/uL (ref 0.1–0.9)
Monocytes: 5 %
Neutrophils Absolute: 7.4 10*3/uL — ABNORMAL HIGH (ref 1.4–7.0)
Neutrophils: 65 %
Platelets: 260 10*3/uL (ref 150–450)
RBC: 4.57 x10E6/uL (ref 3.77–5.28)
RDW: 13.1 % (ref 11.7–15.4)
WBC: 11.4 10*3/uL — ABNORMAL HIGH (ref 3.4–10.8)

## 2020-10-25 LAB — CMP14+EGFR
ALT: 14 IU/L (ref 0–32)
AST: 11 IU/L (ref 0–40)
Albumin/Globulin Ratio: 1.7 (ref 1.2–2.2)
Albumin: 4 g/dL (ref 3.8–4.8)
Alkaline Phosphatase: 94 IU/L (ref 44–121)
BUN/Creatinine Ratio: 16 (ref 12–28)
BUN: 13 mg/dL (ref 8–27)
Bilirubin Total: 0.3 mg/dL (ref 0.0–1.2)
CO2: 27 mmol/L (ref 20–29)
Calcium: 9.4 mg/dL (ref 8.7–10.3)
Chloride: 103 mmol/L (ref 96–106)
Creatinine, Ser: 0.83 mg/dL (ref 0.57–1.00)
Globulin, Total: 2.4 g/dL (ref 1.5–4.5)
Glucose: 150 mg/dL — ABNORMAL HIGH (ref 65–99)
Potassium: 4.4 mmol/L (ref 3.5–5.2)
Sodium: 144 mmol/L (ref 134–144)
Total Protein: 6.4 g/dL (ref 6.0–8.5)
eGFR: 80 mL/min/{1.73_m2} (ref >59)

## 2020-10-26 ENCOUNTER — Ambulatory Visit: Payer: Medicare HMO

## 2020-11-26 ENCOUNTER — Ambulatory Visit: Payer: Medicare HMO

## 2020-11-27 ENCOUNTER — Other Ambulatory Visit: Payer: Self-pay

## 2020-11-27 ENCOUNTER — Ambulatory Visit (INDEPENDENT_AMBULATORY_CARE_PROVIDER_SITE_OTHER): Payer: Medicare HMO | Admitting: *Deleted

## 2020-11-27 DIAGNOSIS — D518 Other vitamin B12 deficiency anemias: Secondary | ICD-10-CM

## 2020-11-27 NOTE — Progress Notes (Signed)
Pt tolerated B12 well right deltoid

## 2020-12-31 ENCOUNTER — Other Ambulatory Visit: Payer: Self-pay

## 2020-12-31 ENCOUNTER — Ambulatory Visit (INDEPENDENT_AMBULATORY_CARE_PROVIDER_SITE_OTHER): Payer: Medicare HMO

## 2020-12-31 DIAGNOSIS — D518 Other vitamin B12 deficiency anemias: Secondary | ICD-10-CM

## 2020-12-31 NOTE — Progress Notes (Signed)
Cyanocobalamin injection given to left deltoid.  Patient tolerated well. 

## 2021-01-08 DIAGNOSIS — M180 Bilateral primary osteoarthritis of first carpometacarpal joints: Secondary | ICD-10-CM | POA: Diagnosis not present

## 2021-01-25 ENCOUNTER — Other Ambulatory Visit: Payer: Self-pay

## 2021-01-25 ENCOUNTER — Encounter: Payer: Self-pay | Admitting: Family Medicine

## 2021-01-25 ENCOUNTER — Ambulatory Visit (INDEPENDENT_AMBULATORY_CARE_PROVIDER_SITE_OTHER): Payer: Medicare HMO | Admitting: Family Medicine

## 2021-01-25 VITALS — BP 112/57 | HR 78 | Ht 70.0 in | Wt 246.0 lb

## 2021-01-25 DIAGNOSIS — K219 Gastro-esophageal reflux disease without esophagitis: Secondary | ICD-10-CM

## 2021-01-25 DIAGNOSIS — E1169 Type 2 diabetes mellitus with other specified complication: Secondary | ICD-10-CM

## 2021-01-25 DIAGNOSIS — F411 Generalized anxiety disorder: Secondary | ICD-10-CM | POA: Diagnosis not present

## 2021-01-25 DIAGNOSIS — E782 Mixed hyperlipidemia: Secondary | ICD-10-CM

## 2021-01-25 LAB — BAYER DCA HB A1C WAIVED: HB A1C (BAYER DCA - WAIVED): 6.7 % (ref <7.0)

## 2021-01-25 NOTE — Progress Notes (Signed)
BP (!) 112/57   Pulse 78   Ht _0  (1.778 m)   Wt 246 lb (111.6 kg)   SpO2 95%   BMI 35.30 kg/m    Subjective:   Patient ID: [name], female    DOB: 1956/02/15, 65 y.o.   MRN: [id number]  HPI: [name] is a 65 y.o. female presenting on 01/25/2021 for Medical Management of Chronic Issues, Diabetes, and Depression   HPI Type 2 diabetes mellitus Patient comes in today for recheck of his diabetes. Patient has been currently taking metformin. Patient is not currently on an ACE inhibitor/ARB. Patient has not seen an ophthalmologist this year. Patient denies any issues with their feet. The symptom started onset as an adult hyperlipidemia ARE RELATED TO DM   GERD Patient is currently on pantoprazole.  She denies any major symptoms or abdominal pain or belching or burping. She denies any blood in her stool or lightheadedness or dizziness.   Hyperlipidemia Patient is coming in for recheck of his hyperlipidemia. The patient is currently taking Crestor. They deny any issues with myalgias or history of liver damage from it. They deny any focal numbness or weakness or chest pain.   Anxiety and depression recheck. Patient is coming in for anxiety depression recheck.  She says she is doing very well on her current medicine.  She currently takes the Celexa and feels like it is doing very well and she is happy and content with life. Depression screen [facility] 2/9 01/25/2021 10/24/2020 10/24/2020 07/26/2020 05/09/2020  Decreased Interest 0 0 0 0 0  Down, Depressed, Hopeless 1 3 0 0 0  PHQ - 2 Score 1 3 0 0 0  Altered sleeping - 3 - - -  Tired, decreased energy - 3 - - -  Change in appetite - 3 - - -  Feeling bad or failure about yourself  - 1 - - -  Trouble concentrating - 1 - - -  Moving slowly or fidgety/restless - 1 - - -  Suicidal thoughts - 0 - - -  PHQ-9 Score - 15 - - -  Some recent data might be hidden     Relevant past medical, surgical, family and social history reviewed  and updated as indicated. Interim medical history since our last visit reviewed. Allergies and medications reviewed and updated.  Review of Systems  HENT:  Negative for congestion, ear discharge and ear pain.   Eyes:  Negative for redness and visual disturbance.  Respiratory:  Negative for chest tightness and shortness of breath.   Cardiovascular:  Negative for chest pain and leg swelling.  Genitourinary:  Negative for difficulty urinating and dysuria.  Musculoskeletal:  Negative for back pain and gait problem.  Skin:  Negative for rash.  Neurological:  Negative for light-headedness and headaches.  Psychiatric/Behavioral:  Negative for agitation and behavioral problems.   All other systems reviewed and are negative.  Per HPI unless specifically indicated above   Allergies as of 01/25/2021       Reactions   Fenofibrate Other (See Comments)   Flu-like    Penicillin G Other (See Comments)   Blisters in mouth   Pravastatin Other (See Comments)   "flu like symptoms"         Medication List        Accurate as of January 25, 2021  3:43 PM. If you have any questions, ask your nurse or doctor.          STOP taking  these medications    azithromycin 250 MG tablet Commonly known as: Zithromax Stopped by: Worthy Rancher, MD   dextromethorphan-guaiFENesin 30-600 MG 12hr tablet Commonly known as: [place] DM Stopped by: [name] Jachelle Fluty, MD       TAKE these medications    albuterol (2.5 MG/3ML) 0.083% nebulizer solution Commonly known as: PROVENTIL Take 3 mLs (2.5 mg total) by nebulization every 6 (six) hours as needed for wheezing or shortness of breath.   albuterol 108 (90 Base) MCG/ACT inhaler Commonly known as: Ventolin HFA Inhale 2 puffs into the lungs every 6 (six) hours as needed. For shortness of breath   budesonide-formoterol 160-4.5 MCG/ACT inhaler Commonly known as: Symbicort Take 2 puffs first thing in am and then another 2 puffs about 12 hours later.    cholecalciferol 1000 units tablet Commonly known as: VITAMIN D Take 1,000 Units by mouth daily.   Cinnamon 500 MG capsule   citalopram 40 MG tablet Commonly known as: CELEXA Take 1 tablet (40 mg total) by mouth daily.   cycloSPORINE 0.05 % ophthalmic emulsion Commonly known as: RESTASIS 1 drop 2 (two) times daily.   folic acid 660 MCG tablet Commonly known as: FOLVITE Take 0.5 tablets (400 mcg total) by mouth daily.   furosemide 20 MG tablet Commonly known as: LASIX Take 1-2 tablets (20-40 mg total) by mouth daily. As needed   gabapentin 600 MG tablet Commonly known as: NEURONTIN Take 1 tablet (600 mg total) by mouth 3 (three) times daily.   hydrocortisone-pramoxine rectal foam Commonly known as: PROCTOFOAM-HC Place 1 applicator rectally 2 (two) times daily. don't use for more than 7 days.   Lidocaine-Hydrocortisone Ace 3-2.5 % Kit Place 1 application rectally daily as needed.   Melatonin 10 MG Tabs Take 1 tablet by mouth at bedtime.   metFORMIN 500 MG 24 hr tablet Commonly known as: Glucophage XR Take 1 tablet (500 mg total) by mouth in the morning and at bedtime.   nystatin 100000 UNIT/ML suspension Commonly known as: MYCOSTATIN Take 5 mLs (500,000 Units total) by mouth 4 (four) times daily.   ondansetron 4 MG tablet Commonly known as: Zofran Take 1 tablet (4 mg total) by mouth every 8 (eight) hours as needed for nausea or vomiting.   pantoprazole 40 MG tablet Commonly known as: PROTONIX Take 1 tablet (40 mg total) by mouth daily.   rosuvastatin 5 MG tablet Commonly known as: Crestor Take 1 tablet (5 mg total) by mouth daily.   Spiriva HandiHaler 18 MCG inhalation capsule Generic drug: tiotropium Place 1 capsule (18 mcg total) into inhaler and inhale daily.   triamcinolone cream 0.1 % Commonly known as: KENALOG   vitamin C 100 MG tablet Take 100 mg by mouth daily.   Zinc 50 MG Tabs Take 25 mg by mouth daily.         Objective:   BP (!)  112/57   Pulse 78   Ht _0  (1.778 m)   Wt 246 lb (111.6 kg)   SpO2 95%   BMI 35.30 kg/m   Wt Readings from Last 3 Encounters:  01/25/21 246 lb (111.6 kg)  10/24/20 248 lb (112.5 kg)  07/26/20 249 lb (112.9 kg)    Physical Exam Vitals and nursing note reviewed.  Constitutional:      General: She is not in acute distress.    Appearance: She is well-developed. She is not diaphoretic.  Eyes:     Conjunctiva/sclera: Conjunctivae normal.  Cardiovascular:     Rate and  Rhythm: Normal rate and regular rhythm.     Heart sounds: Normal heart sounds. No murmur heard. Pulmonary:     Effort: Pulmonary effort is normal. No respiratory distress.     Breath sounds: Normal breath sounds. No wheezing.  Musculoskeletal:        General: No tenderness. Normal range of motion.  Skin:    General: Skin is warm and dry.     Findings: No rash.  Neurological:     Mental Status: She is alert and oriented to person, place, and time.     Coordination: Coordination normal.  Psychiatric:        Behavior: Behavior normal.      Assessment & Plan:   Problem List Items Addressed This Visit       Digestive   Gastroesophageal reflux disease without esophagitis     Endocrine   Type 2 diabetes mellitus with other specified complication ([place]) - Primary   Relevant Orders   Bayer DCA Hb A1c Waived     Other   Mixed hyperlipidemia   Generalized anxiety disorder    Continue current medicine.  A1c looks good.  No changes. Follow up plan: Return in about 3 months (around 04/27/2021) for Diabetes and cholesterol and anxiety recheck.  Counseling provided for all of the vaccine components Orders Placed This Encounter  Procedures   Bayer [place] Hb A1c [place] Zoei Amison, MD [facility] 01/25/2021, 3:43 PM

## 2021-02-01 ENCOUNTER — Other Ambulatory Visit: Payer: Self-pay

## 2021-02-01 ENCOUNTER — Ambulatory Visit (INDEPENDENT_AMBULATORY_CARE_PROVIDER_SITE_OTHER): Payer: Medicare HMO | Admitting: *Deleted

## 2021-02-01 DIAGNOSIS — E538 Deficiency of other specified B group vitamins: Secondary | ICD-10-CM

## 2021-02-20 DIAGNOSIS — Z96653 Presence of artificial knee joint, bilateral: Secondary | ICD-10-CM | POA: Diagnosis not present

## 2021-03-04 ENCOUNTER — Ambulatory Visit (INDEPENDENT_AMBULATORY_CARE_PROVIDER_SITE_OTHER): Payer: Medicare HMO | Admitting: *Deleted

## 2021-03-04 ENCOUNTER — Other Ambulatory Visit: Payer: Self-pay

## 2021-03-04 DIAGNOSIS — E538 Deficiency of other specified B group vitamins: Secondary | ICD-10-CM

## 2021-03-26 ENCOUNTER — Other Ambulatory Visit: Payer: Self-pay

## 2021-03-26 ENCOUNTER — Encounter: Payer: Self-pay | Admitting: Family Medicine

## 2021-03-26 ENCOUNTER — Ambulatory Visit (INDEPENDENT_AMBULATORY_CARE_PROVIDER_SITE_OTHER): Payer: Medicare HMO | Admitting: Family Medicine

## 2021-03-26 VITALS — BP 120/64 | HR 76 | Temp 97.7°F | Ht 70.0 in | Wt 250.0 lb

## 2021-03-26 DIAGNOSIS — K921 Melena: Secondary | ICD-10-CM

## 2021-03-26 DIAGNOSIS — R195 Other fecal abnormalities: Secondary | ICD-10-CM

## 2021-03-26 DIAGNOSIS — R1084 Generalized abdominal pain: Secondary | ICD-10-CM

## 2021-03-26 NOTE — Progress Notes (Signed)
Assessment & Plan:  1. Generalized abdominal pain - CBC with Differential/Platelet - CMP14+EGFR - Lipase - Amylase - Stool culture; Future - Ova and parasite examination; Future - Ambulatory referral to Gastroenterology  2. Mucus in stool - Stool culture; Future - Ova and parasite examination; Future - Ambulatory referral to Gastroenterology  3. Melena - Fecal occult blood, imunochemical; Future - Ambulatory referral to Gastroenterology   Follow up plan: Return if symptoms worsen or fail to improve.  Hendricks Limes, MSN, APRN, FNP-C [facility]  Subjective:   Patient ID: HALLE DAVLIN, female    DOB: [birth year], 65 y.o.   MRN: [id number]  HPI: NONIE LOCHNER is a 65 y.o. female presenting on 03/26/2021 for Diarrhea (X 6 weeks. Then patient states now she is having small dark stool with mucus. )  Patient reports diarrhea x6 weeks that then turned into small soft stools with mucus. Stools are very dark in color. Denies any changes in lifestyle or medications. No recent illnesses. No recent travel outside of local trips to the [place], and beach. States her symptoms did not start surrounding a trip either. She does also report abdominal pain and feeling bloated/full all the time. States her last colonoscopy was with Dr. Laural Golden 7-8 years ago, but I do not see this record in her chart. States it was normal.   ROS: Negative unless specifically indicated above in HPI.   Relevant past medical history reviewed and updated as indicated.   Allergies and medications reviewed and updated.   Current Outpatient Medications:    albuterol (PROVENTIL) (2.5 MG/3ML) 0.083% nebulizer solution, Take 3 mLs (2.5 mg total) by nebulization every 6 (six) hours as needed for wheezing or shortness of breath., Disp: 150 mL, Rfl: 1   albuterol (VENTOLIN HFA) 108 (90 Base) MCG/ACT inhaler, Inhale 2 puffs into the lungs every 6 (six) hours as needed. For shortness of  breath, Disp: 1 Inhaler, Rfl: 1   Ascorbic Acid (VITAMIN C) 100 MG tablet, Take 100 mg by mouth daily., Disp: , Rfl:    budesonide-formoterol (SYMBICORT) 160-4.5 MCG/ACT inhaler, Take 2 puffs first thing in am and then another 2 puffs about 12 hours later., Disp: 10.2 g, Rfl: 11   cholecalciferol (VITAMIN D) 1000 units tablet, Take 1,000 Units by mouth daily., Disp: , Rfl:    Cinnamon 500 MG capsule, , Disp: , Rfl:    citalopram (CELEXA) 40 MG tablet, Take 1 tablet (40 mg total) by mouth daily., Disp: 90 tablet, Rfl: 3   cycloSPORINE (RESTASIS) 0.05 % ophthalmic emulsion, 1 drop 2 (two) times daily., Disp: , Rfl:    folic acid (FOLVITE) 671 MCG tablet, Take 0.5 tablets (400 mcg total) by mouth daily., Disp: 90 tablet, Rfl: 1   furosemide (LASIX) 20 MG tablet, Take 1-2 tablets (20-40 mg total) by mouth daily. As needed, Disp: 60 tablet, Rfl: 6   gabapentin (NEURONTIN) 600 MG tablet, Take 1 tablet (600 mg total) by mouth 3 (three) times daily., Disp: 270 tablet, Rfl: 3   hydrocortisone-pramoxine (PROCTOFOAM-HC) rectal foam, Place 1 applicator rectally 2 (two) times daily. don't use for more than 7 days., Disp: 10 g, Rfl: 1   Lidocaine-Hydrocortisone Ace 3-2.5 % KIT, Place 1 application rectally daily as needed., Disp: 1 kit, Rfl: 1   Melatonin 10 MG TABS, Take 1 tablet by mouth at bedtime., Disp: , Rfl:    nystatin (MYCOSTATIN) 100000 UNIT/ML suspension, Take 5 mLs (500,000 Units total) by mouth 4 (four) times daily.,  Disp: 60 mL, Rfl: 0   ondansetron (ZOFRAN) 4 MG tablet, Take 1 tablet (4 mg total) by mouth every 8 (eight) hours as needed for nausea or vomiting., Disp: 20 tablet, Rfl: 0   pantoprazole (PROTONIX) 40 MG tablet, Take 1 tablet (40 mg total) by mouth daily., Disp: 90 tablet, Rfl: 3   rosuvastatin (CRESTOR) 5 MG tablet, Take 1 tablet (5 mg total) by mouth daily., Disp: 90 tablet, Rfl: 3   tiotropium (SPIRIVA HANDIHALER) 18 MCG inhalation capsule, Place 1 capsule (18 mcg total) into inhaler  and inhale daily., Disp: 90 capsule, Rfl: 3   triamcinolone cream (KENALOG) 0.1 %, , Disp: , Rfl:    Zinc 50 MG TABS, Take 25 mg by mouth daily., Disp: , Rfl:    metFORMIN (GLUCOPHAGE XR) 500 MG 24 hr tablet, Take 1 tablet (500 mg total) by mouth in the morning and at bedtime. (Patient not taking: Reported on 03/26/2021), Disp: 180 tablet, Rfl: 3  Current Facility-Administered Medications:    cyanocobalamin ((VITAMIN B-12)) injection 1,000 mcg, 1,000 mcg, Intramuscular, Q30 days, [name], [name], MD, 1,000 mcg at 03/04/21 1525  Allergies  Allergen Reactions   Fenofibrate Other (See Comments)    Flu-like    Penicillin G Other (See Comments)    Blisters in mouth   Pravastatin Other (See Comments)    "flu like symptoms"     Objective:   BP 120/64   Pulse 76   Temp 97.7 F (36.5 C) (Temporal)   Ht 5' 10" (1.778 m)   Wt 250 lb (113.4 kg)   BMI 35.87 kg/m    Physical Exam Vitals reviewed.  Constitutional:      General: She is not in acute distress.    Appearance: Normal appearance. She is not ill-appearing, toxic-appearing or diaphoretic.  HENT:     Head: Normocephalic and atraumatic.  Eyes:     General: No scleral icterus.       Right eye: No discharge.        Left eye: No discharge.     Conjunctiva/sclera: Conjunctivae normal.  Cardiovascular:     Rate and Rhythm: Normal rate.  Pulmonary:     Effort: Pulmonary effort is normal. No respiratory distress.  Abdominal:     General: There is no distension or abdominal bruit. There are no signs of injury.     Palpations: Abdomen is soft. There is no shifting dullness, hepatomegaly or splenomegaly.     Tenderness: There is generalized abdominal tenderness.  Musculoskeletal:        General: Normal range of motion.     Cervical back: Normal range of motion.  Skin:    General: Skin is warm and dry.     Capillary Refill: Capillary refill takes less than 2 seconds.  Neurological:     General: No focal deficit present.      Mental Status: She is alert and oriented to person, place, and time. Mental status is at baseline.  Psychiatric:        Mood and Affect: Mood normal.        Behavior: Behavior normal.        Thought Content: Thought content normal.        Judgment: Judgment normal.

## 2021-03-27 ENCOUNTER — Other Ambulatory Visit: Payer: Medicare HMO

## 2021-03-27 ENCOUNTER — Other Ambulatory Visit: Payer: Self-pay

## 2021-03-27 DIAGNOSIS — R195 Other fecal abnormalities: Secondary | ICD-10-CM | POA: Diagnosis not present

## 2021-03-27 DIAGNOSIS — K921 Melena: Secondary | ICD-10-CM | POA: Diagnosis not present

## 2021-03-27 DIAGNOSIS — R1084 Generalized abdominal pain: Secondary | ICD-10-CM

## 2021-03-27 LAB — CMP14+EGFR
ALT: 11 IU/L (ref 0–32)
AST: 11 IU/L (ref 0–40)
Albumin/Globulin Ratio: 1.8 (ref 1.2–2.2)
Albumin: 3.9 g/dL (ref 3.8–4.8)
Alkaline Phosphatase: 82 IU/L (ref 44–121)
BUN/Creatinine Ratio: 12 (ref 12–28)
BUN: 10 mg/dL (ref 8–27)
Bilirubin Total: 0.2 mg/dL (ref 0.0–1.2)
CO2: 24 mmol/L (ref 20–29)
Calcium: 9.1 mg/dL (ref 8.7–10.3)
Chloride: 103 mmol/L (ref 96–106)
Creatinine, Ser: 0.84 mg/dL (ref 0.57–1.00)
Globulin, Total: 2.2 g/dL (ref 1.5–4.5)
Glucose: 154 mg/dL — ABNORMAL HIGH (ref 65–99)
Potassium: 4.4 mmol/L (ref 3.5–5.2)
Sodium: 144 mmol/L (ref 134–144)
Total Protein: 6.1 g/dL (ref 6.0–8.5)
eGFR: 79 mL/min/{1.73_m2} (ref >59)

## 2021-03-27 LAB — CBC WITH DIFFERENTIAL/PLATELET
Basophils Absolute: 0.1 10*3/uL (ref 0.0–0.2)
Basos: 1 %
EOS (ABSOLUTE): 0.1 10*3/uL (ref 0.0–0.4)
Eos: 1 %
Hematocrit: 40.3 % (ref 34.0–46.6)
Hemoglobin: 13.4 g/dL (ref 11.1–15.9)
Immature Grans (Abs): 0 10*3/uL (ref 0.0–0.1)
Immature Granulocytes: 0 %
Lymphocytes Absolute: 3.1 10*3/uL (ref 0.7–3.1)
Lymphs: 28 %
MCH: 29.7 pg (ref 26.6–33.0)
MCHC: 33.3 g/dL (ref 31.5–35.7)
MCV: 89 fL (ref 79–97)
Monocytes Absolute: 0.6 10*3/uL (ref 0.1–0.9)
Monocytes: 5 %
Neutrophils Absolute: 7.3 10*3/uL — ABNORMAL HIGH (ref 1.4–7.0)
Neutrophils: 65 %
Platelets: 278 10*3/uL (ref 150–450)
RBC: 4.51 x10E6/uL (ref 3.77–5.28)
RDW: 12.2 % (ref 11.7–15.4)
WBC: 11.3 10*3/uL — ABNORMAL HIGH (ref 3.4–10.8)

## 2021-03-27 LAB — LIPASE: Lipase: 22 U/L (ref 14–72)

## 2021-03-27 LAB — AMYLASE: Amylase: 20 U/L — ABNORMAL LOW (ref 31–110)

## 2021-03-28 ENCOUNTER — Encounter (INDEPENDENT_AMBULATORY_CARE_PROVIDER_SITE_OTHER): Payer: Self-pay | Admitting: *Deleted

## 2021-03-31 LAB — STOOL CULTURE: E coli, Shiga toxin Assay: NEGATIVE

## 2021-04-02 LAB — FECAL OCCULT BLOOD, IMMUNOCHEMICAL: Fecal Occult Bld: NEGATIVE

## 2021-04-05 ENCOUNTER — Ambulatory Visit (INDEPENDENT_AMBULATORY_CARE_PROVIDER_SITE_OTHER): Payer: Medicare HMO | Admitting: *Deleted

## 2021-04-05 ENCOUNTER — Other Ambulatory Visit: Payer: Self-pay

## 2021-04-05 DIAGNOSIS — E538 Deficiency of other specified B group vitamins: Secondary | ICD-10-CM

## 2021-04-05 LAB — OVA AND PARASITE EXAMINATION

## 2021-04-08 ENCOUNTER — Ambulatory Visit (INDEPENDENT_AMBULATORY_CARE_PROVIDER_SITE_OTHER): Payer: Medicare HMO

## 2021-04-08 VITALS — Ht 70.0 in | Wt 250.0 lb

## 2021-04-08 DIAGNOSIS — Z0001 Encounter for general adult medical examination with abnormal findings: Secondary | ICD-10-CM

## 2021-04-08 DIAGNOSIS — F17210 Nicotine dependence, cigarettes, uncomplicated: Secondary | ICD-10-CM | POA: Diagnosis not present

## 2021-04-08 DIAGNOSIS — Z0000 Encounter for general adult medical examination without abnormal findings: Secondary | ICD-10-CM

## 2021-04-08 NOTE — Patient Instructions (Signed)
[name] , Thank you for taking time to come for your Medicare Wellness Visit. I appreciate your ongoing commitment to your health goals. Please review the following plan we discussed and let me know if I can assist you in the future.   Screening recommendations/referrals: Colonoscopy: done 08/02/2015, repeat 10 years Mammogram: done 04/18/16, pt declines repeat Bone Density: done 03/29/18, repeat 2 years. *due at next visit Recommended yearly ophthalmology/optometry visit for glaucoma screening and checkup Recommended yearly dental visit for hygiene and checkup  Vaccinations: Influenza vaccine: due every fall Pneumococcal vaccine: done 09/12/13, 03/29/2018 Tdap vaccine: done 10/02/2014 repeat 10 years Shingles vaccine: due  Covid-19: declined  Advanced directives: in chart  Conditions/risks identified: Aim for 30 minutes of exercise or brisk walking each day, drink 6-8 glasses of water and eat lots of fruits and vegetables. If you wish to quit smoking, help is available. For free tobacco cessation program offerings call the [facility] at (581) 108-7514 or Live Well Line at 657 619 3201. You may also visit www.[place].com or email livelifewell_0 .com for more information on other programs.   You may also call 1-800-QUIT-NOW 873 796 0046) or visit www.V[employer]is.dk or www.BecomeAnEx.org for additional resources on smoking cessation.    Next appointment: Follow up in one year for your annual wellness visit.   Preventive Care 40-64 Years, Female Preventive care refers to lifestyle choices and visits with your health care provider that can promote health and wellness. What does preventive care include? A yearly physical exam. This is also called an annual well check. Dental exams once or twice a year. Routine eye exams. Ask your health care provider how often you should have your eyes checked. Personal lifestyle choices, including: Daily care of your teeth and  gums. Regular physical activity. Eating a healthy diet. Avoiding tobacco and drug use. Limiting alcohol use. Practicing safe sex. Taking low-dose aspirin daily starting at age 22. Taking vitamin and mineral supplements as recommended by your health care provider. What happens during an annual well check? The services and screenings done by your health care provider during your annual well check will depend on your age, overall health, lifestyle risk factors, and family history of disease. Counseling  Your health care provider may ask you questions about your: Alcohol use. Tobacco use. Drug use. Emotional well-being. Home and relationship well-being. Sexual activity. Eating habits. Work and work Statistician. Method of birth control. Menstrual cycle. Pregnancy history. Screening  You may have the following tests or measurements: Height, weight, and BMI. Blood pressure. Lipid and cholesterol levels. These may be checked every 5 years, or more frequently if you are over 30 years old. Skin check. Lung cancer screening. You may have this screening every year starting at age 76 if you have a 30-pack-year history of smoking and currently smoke or have quit within the past 15 years. Fecal occult blood test (FOBT) of the stool. You may have this test every year starting at age 8. Flexible sigmoidoscopy or colonoscopy. You may have a sigmoidoscopy every 5 years or a colonoscopy every 10 years starting at age 40. Hepatitis C blood test. Hepatitis B blood test. Sexually transmitted disease (STD) testing. Diabetes screening. This is done by checking your blood sugar (glucose) after you have not eaten for a while (fasting). You may have this done every 1-3 years. Mammogram. This may be done every 1-2 years. Talk to your health care provider about when you should start having regular mammograms. This may depend on whether you have a family  history of breast cancer. BRCA-related cancer  screening. This may be done if you have a family history of breast, ovarian, tubal, or peritoneal cancers. Pelvic exam and Pap test. This may be done every 3 years starting at age 44. Starting at age 60, this may be done every 5 years if you have a Pap test in combination with an HPV test. Bone density scan. This is done to screen for osteoporosis. You may have this scan if you are at high risk for osteoporosis. Discuss your test results, treatment options, and if necessary, the need for more tests with your health care provider. Vaccines  Your health care provider may recommend certain vaccines, such as: Influenza vaccine. This is recommended every year. Tetanus, diphtheria, and acellular pertussis (Tdap, Td) vaccine. You may need a Td booster every 10 years. Zoster vaccine. You may need this after age 84. Pneumococcal 13-valent conjugate (PCV13) vaccine. You may need this if you have certain conditions and were not previously vaccinated. Pneumococcal polysaccharide (PPSV23) vaccine. You may need one or two doses if you smoke cigarettes or if you have certain conditions. Talk to your health care provider about which screenings and vaccines you need and how often you need them. This information is not intended to replace advice given to you by your health care provider. Make sure you discuss any questions you have with your health care provider. Document Released: 08/24/2015 Document Revised: 04/16/2016 Document Reviewed: 05/29/2015 Elsevier Interactive Patient Education  2017 [employer] Prevention in the Home Falls can cause injuries. They can happen to people of all ages. There are many things you can do to make your home safe and to help prevent falls. What can I do on the outside of my home? Regularly fix the edges of walkways and driveways and fix any cracks. Remove anything that might make you trip as you walk through a door, such as a raised step or threshold. Trim any  bushes or trees on the path to your home. Use bright outdoor lighting. Clear any walking paths of anything that might make someone trip, such as rocks or tools. Regularly check to see if handrails are loose or broken. Make sure that both sides of any steps have handrails. Any raised decks and porches should have guardrails on the edges. Have any leaves, snow, or ice cleared regularly. Use sand or salt on walking paths during winter. Clean up any spills in your garage right away. This includes oil or grease spills. What can I do in the bathroom? Use night lights. Install grab bars by the toilet and in the tub and shower. Do not use towel bars as grab bars. Use non-skid mats or decals in the tub or shower. If you need to sit down in the shower, use a plastic, non-slip stool. Keep the floor dry. Clean up any water that spills on the floor as soon as it happens. Remove soap buildup in the tub or shower regularly. Attach bath mats securely with double-sided non-slip rug tape. Do not have throw rugs and other things on the floor that can make you trip. What can I do in the bedroom? Use night lights. Make sure that you have a light by your bed that is easy to reach. Do not use any sheets or blankets that are too big for your bed. They should not hang down onto the floor. Have a firm chair that has side arms. You can use this for support while you get  dressed. Do not have throw rugs and other things on the floor that can make you trip. What can I do in the kitchen? Clean up any spills right away. Avoid walking on wet floors. Keep items that you use a lot in easy-to-reach places. If you need to reach something above you, use a strong step stool that has a grab bar. Keep electrical cords out of the way. Do not use floor polish or wax that makes floors slippery. If you must use wax, use non-skid floor wax. Do not have throw rugs and other things on the floor that can make you trip. What can I do  with my stairs? Do not leave any items on the stairs. Make sure that there are handrails on both sides of the stairs and use them. Fix handrails that are broken or loose. Make sure that handrails are as long as the stairways. Check any carpeting to make sure that it is firmly attached to the stairs. Fix any carpet that is loose or worn. Avoid having throw rugs at the top or bottom of the stairs. If you do have throw rugs, attach them to the floor with carpet tape. Make sure that you have a light switch at the top of the stairs and the bottom of the stairs. If you do not have them, ask someone to add them for you. What else can I do to help prevent falls? Wear shoes that: Do not have high heels. Have rubber bottoms. Are comfortable and fit you well. Are closed at the toe. Do not wear sandals. If you use a stepladder: Make sure that it is fully opened. Do not climb a closed stepladder. Make sure that both sides of the stepladder are locked into place. Ask someone to hold it for you, if possible. Clearly mark and make sure that you can see: Any grab bars or handrails. First and last steps. Where the edge of each step is. Use tools that help you move around (mobility aids) if they are needed. These include: Canes. Walkers. Scooters. Crutches. Turn on the lights when you go into a dark area. Replace any light bulbs as soon as they burn out. Set up your furniture so you have a clear path. Avoid moving your furniture around. If any of your floors are uneven, fix them. If there are any pets around you, be aware of where they are. Review your medicines with your doctor. Some medicines can make you feel dizzy. This can increase your chance of falling. Ask your doctor what other things that you can do to help prevent falls. This information is not intended to replace advice given to you by your health care provider. Make sure you discuss any questions you have with your health care  provider. Document Released: 05/24/2009 Document Revised: 01/03/2016 Document Reviewed: 09/01/2014 Elsevier Interactive Patient Education  2017 [employer].

## 2021-04-08 NOTE — Progress Notes (Signed)
Subjective:   [name] is a 65 y.o. female who presents for Medicare Annual (Subsequent) preventive examination.  Virtual Visit via Telephone Note  I connected with  [name] on 04/08/21 at  2:00 PM EDT by telephone and verified that I am speaking with the correct person using two identifiers.  Location: Patient: Home Provider: [facility] Persons participating in the virtual visit: patient/Nurse Health Advisor   I discussed the limitations, risks, security and privacy concerns of performing an evaluation and management service by telephone and the availability of in person appointments. The patient expressed understanding and agreed to proceed.  Interactive audio and video telecommunications were attempted between this nurse and patient, however failed, due to patient having technical difficulties OR patient did not have access to video capability.  We continued and completed visit with audio only.  Some vital signs may be absent or patient reported.   Shaley Leavens E Mannix Kroeker, LPN   Review of Systems     Cardiac Risk Factors include: smoking/ tobacco exposure;sedentary lifestyle;obesity (BMI >30kg/m2);diabetes mellitus;dyslipidemia;Other (see comment), Risk factor comments: Athlerosclerosis/COPD     Objective:    Today's Vitals   04/08/21 1402  Weight: 250 lb (113.4 kg)  Height: 5' 10"  (1.778 m)  PainSc: 4    Body mass index is 35.87 kg/m.  Advanced Directives 04/06/2020 04/06/2019 03/29/2018 02/12/2017 06/14/2016 06/14/2016 08/02/2015  Does Patient Have a Medical Advance Directive? Yes No;Yes Yes Yes Yes Yes No;Yes  Type of Paramedic of [place];Living will;Out of facility DNR (pink MOST or yellow form) Living will;Healthcare Power of [place];Living will Out of facility DNR (pink MOST or yellow form) [place];Living will [place];Living will;Out of facility DNR (pink MOST or yellow form)  Living will  Does patient want to make changes to medical advance directive? No - Patient declined No - Patient declined No - Patient declined - No - Patient declined - -  Copy of [place] in Chart? Yes - validated most recent copy scanned in chart (See row information) No - copy requested No - copy requested - No - copy requested - -  Would patient like information on creating a medical advance directive? - No - Patient declined - - - - -    Current Medications (verified) Outpatient Encounter Medications as of 04/08/2021  Medication Sig   albuterol (PROVENTIL) (2.5 MG/3ML) 0.083% nebulizer solution Take 3 mLs (2.5 mg total) by nebulization every 6 (six) hours as needed for wheezing or shortness of breath.   albuterol (VENTOLIN HFA) 108 (90 Base) MCG/ACT inhaler Inhale 2 puffs into the lungs every 6 (six) hours as needed. For shortness of breath   Ascorbic Acid (VITAMIN C) 100 MG tablet Take 100 mg by mouth daily.   budesonide-formoterol (SYMBICORT) 160-4.5 MCG/ACT inhaler Take 2 puffs first thing in am and then another 2 puffs about 12 hours later.   cholecalciferol (VITAMIN D) 1000 units tablet Take 1,000 Units by mouth daily.   Cinnamon 500 MG capsule    citalopram (CELEXA) 40 MG tablet Take 1 tablet (40 mg total) by mouth daily.   cycloSPORINE (RESTASIS) 0.05 % ophthalmic emulsion 1 drop 2 (two) times daily.   folic acid (FOLVITE) 010 MCG tablet Take 0.5 tablets (400 mcg total) by mouth daily.   furosemide (LASIX) 20 MG tablet Take 1-2 tablets (20-40 mg total) by mouth daily. As needed   gabapentin (NEURONTIN) 600 MG tablet Take 1 tablet (600  mg total) by mouth 3 (three) times daily.   hydrocortisone-pramoxine (PROCTOFOAM-HC) rectal foam Place 1 applicator rectally 2 (two) times daily. don't use for more than 7 days.   Lidocaine-Hydrocortisone Ace 3-2.5 % KIT Place 1 application rectally daily as needed.   Melatonin 10 MG TABS Take 1 tablet by mouth at bedtime.    nystatin (MYCOSTATIN) 100000 UNIT/ML suspension Take 5 mLs (500,000 Units total) by mouth 4 (four) times daily.   ondansetron (ZOFRAN) 4 MG tablet Take 1 tablet (4 mg total) by mouth every 8 (eight) hours as needed for nausea or vomiting.   pantoprazole (PROTONIX) 40 MG tablet Take 1 tablet (40 mg total) by mouth daily.   rosuvastatin (CRESTOR) 5 MG tablet Take 1 tablet (5 mg total) by mouth daily.   tiotropium (SPIRIVA HANDIHALER) 18 MCG inhalation capsule Place 1 capsule (18 mcg total) into inhaler and inhale daily.   triamcinolone cream (KENALOG) 0.1 %    Zinc 50 MG TABS Take 25 mg by mouth daily.   metFORMIN (GLUCOPHAGE XR) 500 MG 24 hr tablet Take 1 tablet (500 mg total) by mouth in the morning and at bedtime. (Patient not taking: No sig reported)   Facility-Administered Encounter Medications as of 04/08/2021  Medication   cyanocobalamin ((VITAMIN B-12)) injection 1,000 mcg    Allergies (verified) Fenofibrate, Penicillin g, and Pravastatin   History: Past Medical History:  Diagnosis Date   Anxiety    Arthritis    COPD (chronic obstructive pulmonary disease) (HCC)    GERD (gastroesophageal reflux disease)    History of skin cancer    Migraine    Mixed hyperlipidemia    Multiple sclerosis (HCC)    Left-sided weakness   Neuropathy    Pre-diabetes    Psoriasis    Seasonal allergies    Past Surgical History:  Procedure Laterality Date   ABDOMINAL HYSTERECTOMY     CHOLECYSTECTOMY     COLONOSCOPY N/A 08/02/2015   Procedure: COLONOSCOPY;  Surgeon: Rogene Houston, MD;  [facility];  Service: Endoscopy;  Laterality: N/A;  12:00-moved to 1220 Ann to notify pt   KNEE ARTHROSCOPY     RT   LAPAROSCOPIC APPENDECTOMY N/A 02/12/2017   Procedure: APPENDECTOMY LAPAROSCOPIC;  Surgeon: Aviva Signs, MD;  Location: [facility];  Service: General;  Laterality: N/A;   Left breast lumpectomy     Left carpal tunnel release     PARTIAL KNEE ARTHROPLASTY Left 01/22/2015   Procedure: LEFT  KNEE MEDIAL UNICOMPARTMENTAL KNEE;  Surgeon: Gaynelle Arabian, MD;  Location: [facility];  Service: Orthopedics;  Laterality: Left;   Right total knee replacement  2010   SHOULDER SURGERY Left    Sinus cyst removal     TOTAL KNEE REVISION  05/05/2012   Procedure: TOTAL KNEE REVISION;  Surgeon: Gearlean Alf, MD;  Location: [facility];  Service: Orthopedics;  Laterality: Right;   WRIST SURGERY     rt   Family History  Problem Relation Age of Onset   Dementia Mother    Thyroid disease Mother        goiter    Diabetes Mother    Atrial fibrillation Father        Died with pulmonary embolus   Emphysema Father    Heart disease Father    Other Father        blood clots    Celiac disease Father    GI problems Father        dumping syndrome - malabsorbtion  Cancer - Colon Maternal Grandmother    Dementia Maternal Grandmother    Heart disease Maternal Grandfather    Stroke Maternal Grandfather    Other Sister        hereditary = blood clots    Parkinson's disease Brother    Alcohol abuse Brother    Arthritis Son    Heart attack Paternal Grandmother    Other Paternal Grandfather        appendix rupture   Hypertension Brother    Hypertension Son    Social History   Socioeconomic History   Marital status: Married    Spouse name: [name]    Number of children: 2   Years of education: Not on file   Highest education level: Some college, no degree  Occupational History   Occupation: diabled    Comment: Designer, multimedia  Tobacco Use   Smoking status: Every Day    Packs/day: 0.50    Years: 40.00    Pack years: 20.00    Types: Cigarettes    Start date: 06/16/1972   Smokeless tobacco: Never   Tobacco comments:    3/4 pack a day sicne age 44  Vaping Use   Vaping Use: Never used  Substance and Sexual Activity   Alcohol use: No    Alcohol/week: 0.0 standard drinks   Drug use: No   Sexual activity: Not on file  Other Topics Concern   Not on file  Social History Narrative    Disabled, married, lives with husband [place]. 2 children  - 9 grandchildren, 2 dogs   Children live within 10 miles   Social Determinants of Health   Financial Resource Strain: Low Risk    Difficulty of Paying Living Expenses: Not hard at all  Food Insecurity: No Food Insecurity   Worried About Charity fundraiser in the Last Year: Never true   Arboriculturist in the Last Year: Never true  Transportation Needs: No Transportation Needs   Lack of Transportation (Medical): No   Lack of Transportation (Non-Medical): No  Physical Activity: Insufficiently Active   Days of Exercise per Week: 3 days   Minutes of Exercise per Session: 20 min  Stress: No Stress Concern Present   Feeling of Stress : Only a little  Social Connections: Engineer, building services of Communication with Friends and Family: More than three times a week   Frequency of Social Gatherings with Friends and Family: Once a week   Attends Religious Services: More than 4 times per year   Active Member of [employer] or Organizations: Yes   Attends Music therapist: More than 4 times per year   Marital Status: Married    Tobacco Counseling Ready to quit: No Counseling given: Yes Tobacco comments: 3/4 pack a day sicne age 47   Clinical Intake:  Pre-visit preparation completed: Yes  Pain : 0-10 Pain Score: 4  Pain Type: Chronic pain Pain Location: Abdomen Pain Orientation: Lower Pain Descriptors / Indicators: Aching, Discomfort Pain Onset: More than a month ago Pain Frequency: Intermittent     BMI - recorded: 35.87 Nutritional Status: BMI > 30  Obese Nutritional Risks: Nausea/ vomitting/ diarrhea (diarrhea - has f/u) Diabetes: Yes CBG done?: No Did pt. bring in CBG monitor from home?: No  How often do you need to have someone help you when you read instructions, pamphlets, or other written materials from your doctor or pharmacy?: 1 - Never  Diabetic?Nutrition Risk Assessment:  Has the  patient  had any N/V/D within the last 2 months?   Diarrhea x 2 months. Has appointment with GI Does the patient have any non-healing wounds?  No  Has the patient had any unintentional weight loss or weight gain?  No   Diabetes:  Is the patient diabetic?  Yes  If diabetic, was a CBG obtained today?  No  Did the patient bring in their glucometer from home?  No  How often do you monitor your CBG's? never.   Financial Strains and Diabetes Management:  Are you having any financial strains with the device, your supplies or your medication? No .  Does the patient want to be seen by Chronic Care Management for management of their diabetes?  No  Would the patient like to be referred to a Nutritionist or for Diabetic Management?  No   Diabetic Exams:  Diabetic Eye Exam: Completed 12/21/19. Overdue for diabetic eye exam. Pt has been advised about the importance in completing this exam. Has upcoming appointment.  Diabetic Foot Exam: Completed 05/09/20  Pt has been advised about the importance in completing this exam. Pt is scheduled for diabetic foot exam on 04/25/21.   Interpreter Needed?: No  Information entered by :: Keyleen Cerrato, LPN   Activities of Daily Living In your present state of health, do you have any difficulty performing the following activities: 04/08/2021  Hearing? N  Vision? N  Difficulty concentrating or making decisions? N  Walking or climbing stairs? Y  Comment sometimes - avoids stairs  Dressing or bathing? N  Doing errands, shopping? N  Preparing Food and eating ? N  Using the Toilet? N  In the past six months, have you accidently leaked urine? Y  Comment wears pads for protection  Do you have problems with loss of bowel control? Y  Comment has GI appt in December  Managing your Medications? N  Managing your Finances? N  Housekeeping or managing your Housekeeping? N  Some recent data might be hidden    Patient Care Team: [name], [name], MD as PCP - General (Family  Medicine) Satira Sark, MD as PCP - Cardiology (Cardiology) Rogene Houston, MD as Consulting Physician (Gastroenterology) Weber Cooks, MD as Referring Physician (Orthopedic Surgery) Ilean China, RN as Registered Nurse  Indicate any recent [place] you may have received from other than [facility] providers in the past year (date may be approximate).     Assessment:   This is a routine wellness examination for [name].  Hearing/Vision screen Hearing Screening - Comments:: Denies hearing difficulties  Vision Screening - Comments:: Wears glasses - up to date with annual eye exams with [facility]  Dietary issues and exercise activities discussed: Current Exercise Habits: Home exercise routine, Type of exercise: walking, Time (Minutes): 20, Frequency (Times/Week): 3, Weekly Exercise (Minutes/Week): 60, Intensity: Mild, Exercise limited by: respiratory conditions(s);cardiac condition(s);orthopedic condition(s)   Goals Addressed             This Visit's Progress    Patient Stated       She wants to travel with her husband and zip-line       Depression Screen PHQ 2/9 Scores 04/08/2021 03/26/2021 01/25/2021 10/24/2020 10/24/2020 07/26/2020 05/09/2020  PHQ - 2 Score 2 2 1 3  0 0 0  PHQ- 9 Score 7 8 - 15 - - -    Fall Risk Fall Risk  04/08/2021 03/26/2021 01/25/2021 10/24/2020 07/26/2020  Falls in the past year? 0 0 0 0 0  Number  falls in past yr: 0 - - - -  Injury with Fall? 0 - - - -  Comment - - - - -  Risk for fall due to : Impaired vision;Orthopedic patient;Medication side effect - - - -  Follow up Falls prevention discussed;Education provided - - - -    FALL RISK PREVENTION PERTAINING TO THE HOME:  Any stairs in or around the home? Yes  If so, are there any without handrails? No  Home free of loose throw rugs in walkways, pet beds, electrical cords, etc? Yes  Adequate lighting in your home to reduce risk of falls? Yes   ASSISTIVE DEVICES UTILIZED TO PREVENT  FALLS:  Life alert?  Iwatch - with fall alert Use of a cane, walker or w/c? No  Grab bars in the bathroom? No  Shower chair or bench in shower? No  Elevated toilet seat or a handicapped toilet? Yes   TIMED UP AND GO:  Was the test performed? No . Telephonic visit  Cognitive Function: Normal cognitive status assessed by direct observation by this Nurse Health Advisor. No abnormalities found.    MMSE - Mini Mental State Exam 03/29/2018  Orientation to time 5  Orientation to Place 5  Registration 3  Attention/ Calculation 5  Recall 2  Language- name 2 objects 2  Language- repeat 1  Language- follow 3 step command 3  Language- read & follow direction 1  Write a sentence 1  Copy design 1  Total score 29     6CIT Screen 04/06/2020 04/06/2019  What Year? 0 points 0 points  What month? 0 points 0 points  What time? 0 points 0 points  Count back from 20 0 points 0 points  Months in reverse 0 points 0 points  Repeat phrase 0 points 0 points  Total Score 0 0    Immunizations Immunization History  Administered Date(s) Administered   Influenza,inj,Quad PF,6+ Mos 06/06/2013   Pneumococcal Conjugate-13 03/29/2018   Pneumococcal Polysaccharide-23 09/12/2013   Tdap 10/02/2014    TDAP status: Up to date  Flu Vaccine status: Due, Education has been provided regarding the importance of this vaccine. Advised may receive this vaccine at local pharmacy or Health Dept. Aware to provide a copy of the vaccination record if obtained from local pharmacy or Health Dept. Verbalized acceptance and understanding.  Pneumococcal vaccine status: Up to date  Covid-19 vaccine status: Declined, Education has been provided regarding the importance of this vaccine but patient still declined. Advised may receive this vaccine at local pharmacy or Health Dept.or vaccine clinic. Aware to provide a copy of the vaccination record if obtained from local pharmacy or Health Dept. Verbalized acceptance and  understanding.  Qualifies for Shingles Vaccine? Yes   Zostavax completed No   Shingrix Completed?: No.    Education has been provided regarding the importance of this vaccine. Patient has been advised to call insurance company to determine out of pocket expense if they have not yet received this vaccine. Advised may also receive vaccine at local pharmacy or Health Dept. Verbalized acceptance and understanding.  Screening Tests Health Maintenance  Topic Date Due   INFLUENZA VACCINE  03/11/2021   URINE MICROALBUMIN  05/09/2021   OPHTHALMOLOGY EXAM  04/26/2021 (Originally 12/20/2020)   Zoster Vaccines- Shingrix (1 of 2) 04/26/2021 (Originally [birth year]16/1977)   MAMMOGRAM  07/26/2021 (Originally 04/18/2018)   COVID-19 Vaccine (1) 10/24/2021 (Originally 06/17/1961)   HIV Screening  10/24/2021 (Originally 06/16/1973)   Pneumococcal Vaccine 17-22 Years old (3 - PPSV23  or PCV20) 01/24/2022 (Originally 03/30/2019)   PAP SMEAR-Modifier  09/17/2023 (Originally 06/17/1979)   FOOT EXAM  05/09/2021   HEMOGLOBIN A1C  07/27/2021   COLON CANCER SCREENING ANNUAL FOBT  03/27/2022   TETANUS/TDAP  10/02/2024   COLONOSCOPY (Pts 45-31yr Insurance coverage will need to be confirmed)  08/01/2025   PNEUMOCOCCAL POLYSACCHARIDE VACCINE AGE 24-64 HIGH RISK  Completed   Hepatitis C Screening  Completed   HPV VACCINES  Aged Out    Health Maintenance  Health Maintenance Due  Topic Date Due   INFLUENZA VACCINE  03/11/2021   URINE MICROALBUMIN  05/09/2021    Colorectal cancer screening: Type of screening: Colonoscopy. Completed 08/02/15. Repeat every 10 years  Mammogram declined.  Bone Density status: Completed 03/29/2018. Results reflect: Bone density results: OSTEOPENIA. Repeat every 2 years.  Lung Cancer Screening: (Low Dose CT Chest recommended if Age 63-80years, 20 pack-year currently smoking OR have quit w/in 15years.) does qualify.   Lung Cancer Screening Referral: sent today  Additional Screening:  Hepatitis  C Screening: does qualify; Completed 03/03/16  Vision Screening: Recommended annual ophthalmology exams for early detection of glaucoma and other disorders of the eye. Is the patient up to date with their annual eye exam?  Yes  Who is the provider or what is the name of the office in which the patient attends annual eye exams[facility] [place] is not established with a provider, would they like to be referred to a provider to establish care? No .   Dental Screening: Recommended annual dental exams for proper oral hygiene  Community Resource Referral / Chronic Care Management: CRR required this visit?  No   CCM required this visit?  No      Plan:     I have personally reviewed and noted the following in the patient's chart:   Medical and social history Use of alcohol, tobacco or illicit drugs  Current medications and supplements including opioid prescriptions.  Functional ability and status Nutritional status Physical activity Advanced directives List of other physicians Hospitalizations, surgeries, and ER visits in previous 12 months Vitals Screenings to include cognitive, depression, and falls Referrals and appointments  In addition, I have reviewed and discussed with patient certain preventive protocols, quality metrics, and best practice recommendations. A written personalized care plan for preventive services as well as general preventive health recommendations were provided to patient.     ASandrea Hammond LPN   [id number]  Nurse Notes: Referral sent for lung cancer screening

## 2021-04-11 ENCOUNTER — Encounter (HOSPITAL_COMMUNITY): Payer: Self-pay

## 2021-04-11 NOTE — Progress Notes (Signed)
Attempted to reach patient regarding [facility] referral. Unable to reach, no VM available at this time

## 2021-04-25 ENCOUNTER — Ambulatory Visit (INDEPENDENT_AMBULATORY_CARE_PROVIDER_SITE_OTHER): Payer: Medicare HMO | Admitting: Family Medicine

## 2021-04-25 ENCOUNTER — Other Ambulatory Visit: Payer: Self-pay

## 2021-04-25 ENCOUNTER — Encounter: Payer: Self-pay | Admitting: Family Medicine

## 2021-04-25 VITALS — BP 117/61 | HR 81 | Ht 70.0 in | Wt 249.0 lb

## 2021-04-25 DIAGNOSIS — F411 Generalized anxiety disorder: Secondary | ICD-10-CM | POA: Diagnosis not present

## 2021-04-25 DIAGNOSIS — J449 Chronic obstructive pulmonary disease, unspecified: Secondary | ICD-10-CM

## 2021-04-25 DIAGNOSIS — E782 Mixed hyperlipidemia: Secondary | ICD-10-CM

## 2021-04-25 DIAGNOSIS — K582 Mixed irritable bowel syndrome: Secondary | ICD-10-CM

## 2021-04-25 DIAGNOSIS — E1169 Type 2 diabetes mellitus with other specified complication: Secondary | ICD-10-CM | POA: Diagnosis not present

## 2021-04-25 DIAGNOSIS — K219 Gastro-esophageal reflux disease without esophagitis: Secondary | ICD-10-CM

## 2021-04-25 LAB — BAYER DCA HB A1C WAIVED: HB A1C (BAYER DCA - WAIVED): 6.9 % — ABNORMAL HIGH (ref 4.8–5.6)

## 2021-04-25 MED ORDER — DICYCLOMINE HCL 10 MG PO CAPS: 10.0000 mg | ORAL_CAPSULE | Freq: Three times a day (TID) | ORAL | 3 refills | Status: DC

## 2021-04-25 NOTE — Progress Notes (Signed)
BP 117/61   Pulse 81   Ht _0  (1.778 m)   Wt 249 lb (112.9 kg)   SpO2 96%   BMI 35.73 kg/m    Subjective:   Patient ID: [name], female    DOB: 1956/05/14, 65 y.o.   MRN: [id number]  HPI: [name] is a 65 y.o. female presenting on 04/25/2021 for Medical Management of Chronic Issues, Hyperlipidemia, and Diabetes   HPI Type 2 diabetes mellitus Patient comes in today for recheck of his diabetes. Patient has been currently taking no medication currently, did not tolerate metformin, A1c 6.9 today.. Patient is not currently on an ACE inhibitor/ARB. Patient has not seen an ophthalmologist this year. Patient denies any issues with their feet. The symptom started onset as an adult hyperlipidemia ARE RELATED TO DM   COPD Patient is coming in for COPD recheck today.  He is currently on albuterol Spiriva.  He has a mild chronic cough but denies any major coughing spells or wheezing spells.  He has 2nighttime symptoms per week and 2daytime symptoms per week currently.  Doing okay and stable, no worse or better than she normally does  Hyperlipidemia Patient is coming in for recheck of his hyperlipidemia. The patient is currently taking Crestor. They deny any issues with myalgias or history of liver damage from it. They deny any focal numbness or weakness or chest pain.   GERD Patient is currently on pantoprazole.  She denies any major symptoms or abdominal pain or belching or burping. She denies any blood in her stool or lightheadedness or dizziness.   Patient struggled with bowel issues back and forth between diarrhea and constipation and she gets some pain and spasms and cramping in her abdomen as well.  She says she will have multiple episodes of diarrhea some days and then be constipated on other days.  Relevant past medical, surgical, family and social history reviewed and updated as indicated. Interim medical history since our last visit reviewed. Allergies and medications  reviewed and updated.  Review of Systems  Constitutional:  Negative for chills and fever.  Eyes:  Negative for visual disturbance.  Respiratory:  Negative for chest tightness and shortness of breath.   Cardiovascular:  Negative for chest pain and leg swelling.  Gastrointestinal:  Positive for abdominal distention, abdominal pain, constipation and diarrhea. Negative for nausea and vomiting.  Genitourinary:  Negative for difficulty urinating and dysuria.  Musculoskeletal:  Negative for back pain and gait problem.  Skin:  Negative for rash.  Neurological:  Negative for light-headedness and headaches.  Psychiatric/Behavioral:  Negative for agitation and behavioral problems.   All other systems reviewed and are negative.  Per HPI unless specifically indicated above   Allergies as of 04/25/2021       Reactions   Fenofibrate Other (See Comments)   Flu-like    Penicillin G Other (See Comments)   Blisters in mouth   Pravastatin Other (See Comments)   "flu like symptoms"         Medication List        Accurate as of April 25, 2021  3:59 PM. If you have any questions, ask your nurse or doctor.          STOP taking these medications    cholecalciferol 1000 units tablet Commonly known as: VITAMIN D Stopped by: Worthy Rancher, MD   Cinnamon 500 MG capsule Stopped by: [name] Margarie Mcguirt, MD   cycloSPORINE 0.05 % ophthalmic emulsion Commonly known as:  RESTASIS Stopped by: Worthy Rancher, MD   folic acid 893 MCG tablet Commonly known as: FOLVITE Stopped by: Worthy Rancher, MD   furosemide 20 MG tablet Commonly known as: LASIX Stopped by: Worthy Rancher, MD   gabapentin 600 MG tablet Commonly known as: NEURONTIN Stopped by: Worthy Rancher, MD   hydrocortisone-pramoxine rectal foam Commonly known as: PROCTOFOAM-HC Stopped by: Worthy Rancher, MD   Lidocaine-Hydrocortisone Ace 3-2.5 % Kit Stopped by: [name] Lavoris Canizales, MD   metFORMIN 500  MG 24 hr tablet Commonly known as: Glucophage XR Stopped by: [name] Sophira Rumler, MD   nystatin 100000 UNIT/ML suspension Commonly known as: MYCOSTATIN Stopped by: [name] Salimatou Simone, MD   vitamin C 100 MG tablet Stopped by: Worthy Rancher, MD   Zinc 50 MG Tabs Stopped by: Worthy Rancher, MD       TAKE these medications    albuterol (2.5 MG/3ML) 0.083% nebulizer solution Commonly known as: PROVENTIL Take 3 mLs (2.5 mg total) by nebulization every 6 (six) hours as needed for wheezing or shortness of breath.   albuterol 108 (90 Base) MCG/ACT inhaler Commonly known as: Ventolin HFA Inhale 2 puffs into the lungs every 6 (six) hours as needed. For shortness of breath   budesonide-formoterol 160-4.5 MCG/ACT inhaler Commonly known as: Symbicort Take 2 puffs first thing in am and then another 2 puffs about 12 hours later.   citalopram 40 MG tablet Commonly known as: CELEXA Take 1 tablet (40 mg total) by mouth daily.   dicyclomine 10 MG capsule Commonly known as: Bentyl Take 1 capsule (10 mg total) by mouth 4 (four) times daily -  before meals and at bedtime. Started by: Worthy Rancher, MD   Melatonin 10 MG Tabs Take 1 tablet by mouth at bedtime.   ondansetron 4 MG tablet Commonly known as: Zofran Take 1 tablet (4 mg total) by mouth every 8 (eight) hours as needed for nausea or vomiting.   pantoprazole 40 MG tablet Commonly known as: PROTONIX Take 1 tablet (40 mg total) by mouth daily.   rosuvastatin 5 MG tablet Commonly known as: Crestor Take 1 tablet (5 mg total) by mouth daily.   Spiriva HandiHaler 18 MCG inhalation capsule Generic drug: tiotropium Place 1 capsule (18 mcg total) into inhaler and inhale daily.   triamcinolone cream 0.1 % Commonly known as: KENALOG         Objective:   BP 117/61   Pulse 81   Ht _0  (1.778 m)   Wt 249 lb (112.9 kg)   SpO2 96%   BMI 35.73 kg/m   Wt Readings from Last 3 Encounters:  04/25/21 249 lb  (112.9 kg)  04/08/21 250 lb (113.4 kg)  03/26/21 250 lb (113.4 kg)    Physical Exam Vitals and nursing note reviewed.  Constitutional:      General: She is not in acute distress.    Appearance: She is well-developed. She is not diaphoretic.  Eyes:     Conjunctiva/sclera: Conjunctivae normal.  Cardiovascular:     Rate and Rhythm: Normal rate and regular rhythm.     Heart sounds: Normal heart sounds. No murmur heard. Pulmonary:     Effort: Pulmonary effort is normal. No respiratory distress.     Breath sounds: Normal breath sounds. No wheezing.  Abdominal:     General: Abdomen is flat. Bowel sounds are normal. There is no distension.     Tenderness: There is no abdominal tenderness. There is no guarding.  Musculoskeletal:        General: No tenderness. Normal range of motion.  Skin:    General: Skin is warm and dry.     Findings: No rash.  Neurological:     Mental Status: She is alert and oriented to person, place, and time.     Coordination: Coordination normal.  Psychiatric:        Behavior: Behavior normal.      Assessment & Plan:   Problem List Items Addressed This Visit       Respiratory   COPD  GOLD II/ AB     Digestive   Gastroesophageal reflux disease without esophagitis   Relevant Medications   dicyclomine (BENTYL) 10 MG capsule     Endocrine   Type 2 diabetes mellitus with other specified complication (HCC) - Primary   Relevant Orders   Bayer DCA Hb A1c Waived     Other   Mixed hyperlipidemia   Generalized anxiety disorder   Other Visit Diagnoses     Irritable bowel syndrome with both constipation and diarrhea       Relevant Medications   dicyclomine (BENTYL) 10 MG capsule       No medication for diabetes right now especially with bowel issues, will hold off, focus on diet, A1c 6.9. Follow up plan: Return in about 3 months (around 07/25/2021), or if symptoms worsen or fail to improve, for Diabetes recheck.  Counseling provided for all of the  vaccine components Orders Placed This Encounter  Procedures   Bayer [place] Hb A1c [place] Wynette [place], MD [facility] 04/25/2021, 3:59 PM

## 2021-05-06 ENCOUNTER — Other Ambulatory Visit: Payer: Self-pay

## 2021-05-06 ENCOUNTER — Ambulatory Visit (INDEPENDENT_AMBULATORY_CARE_PROVIDER_SITE_OTHER): Payer: Medicare HMO | Admitting: *Deleted

## 2021-05-06 DIAGNOSIS — D518 Other vitamin B12 deficiency anemias: Secondary | ICD-10-CM

## 2021-05-06 DIAGNOSIS — E538 Deficiency of other specified B group vitamins: Secondary | ICD-10-CM

## 2021-05-27 DIAGNOSIS — M545 Low back pain, unspecified: Secondary | ICD-10-CM | POA: Diagnosis not present

## 2021-05-30 DIAGNOSIS — M5136 Other intervertebral disc degeneration, lumbar region: Secondary | ICD-10-CM | POA: Diagnosis not present

## 2021-06-05 ENCOUNTER — Ambulatory Visit: Payer: Medicare HMO

## 2021-06-06 ENCOUNTER — Other Ambulatory Visit: Payer: Self-pay

## 2021-06-06 ENCOUNTER — Ambulatory Visit (INDEPENDENT_AMBULATORY_CARE_PROVIDER_SITE_OTHER): Payer: Medicare HMO | Admitting: *Deleted

## 2021-06-06 DIAGNOSIS — E538 Deficiency of other specified B group vitamins: Secondary | ICD-10-CM

## 2021-06-10 ENCOUNTER — Ambulatory Visit (INDEPENDENT_AMBULATORY_CARE_PROVIDER_SITE_OTHER): Payer: Medicare HMO | Admitting: Gastroenterology

## 2021-06-13 DIAGNOSIS — D225 Melanocytic nevi of trunk: Secondary | ICD-10-CM | POA: Diagnosis not present

## 2021-06-13 DIAGNOSIS — Z1283 Encounter for screening for malignant neoplasm of skin: Secondary | ICD-10-CM | POA: Diagnosis not present

## 2021-06-13 DIAGNOSIS — D485 Neoplasm of uncertain behavior of skin: Secondary | ICD-10-CM | POA: Diagnosis not present

## 2021-06-18 ENCOUNTER — Telehealth: Payer: Self-pay | Admitting: Family Medicine

## 2021-06-18 NOTE — Telephone Encounter (Signed)
Appointment made for patient - she is aware that she may need to pay

## 2021-06-24 DIAGNOSIS — E119 Type 2 diabetes mellitus without complications: Secondary | ICD-10-CM | POA: Diagnosis not present

## 2021-06-24 DIAGNOSIS — H5210 Myopia, unspecified eye: Secondary | ICD-10-CM | POA: Diagnosis not present

## 2021-06-24 DIAGNOSIS — Z0100 Encounter for examination of eyes and vision without abnormal findings: Secondary | ICD-10-CM | POA: Diagnosis not present

## 2021-07-03 ENCOUNTER — Ambulatory Visit (INDEPENDENT_AMBULATORY_CARE_PROVIDER_SITE_OTHER): Payer: Medicare HMO

## 2021-07-03 ENCOUNTER — Other Ambulatory Visit: Payer: Self-pay

## 2021-07-03 DIAGNOSIS — D518 Other vitamin B12 deficiency anemias: Secondary | ICD-10-CM

## 2021-07-03 DIAGNOSIS — E538 Deficiency of other specified B group vitamins: Secondary | ICD-10-CM

## 2021-07-03 NOTE — Progress Notes (Signed)
Cyanocobalamin injection given to left deltoid.  Patient tolerated well. 

## 2021-07-15 ENCOUNTER — Ambulatory Visit: Payer: Medicare HMO

## 2021-07-16 DIAGNOSIS — M180 Bilateral primary osteoarthritis of first carpometacarpal joints: Secondary | ICD-10-CM | POA: Diagnosis not present

## 2021-07-16 DIAGNOSIS — G7100 Muscular dystrophy, unspecified: Secondary | ICD-10-CM | POA: Diagnosis not present

## 2021-07-18 DIAGNOSIS — L988 Other specified disorders of the skin and subcutaneous tissue: Secondary | ICD-10-CM | POA: Diagnosis not present

## 2021-07-18 DIAGNOSIS — D485 Neoplasm of uncertain behavior of skin: Secondary | ICD-10-CM | POA: Diagnosis not present

## 2021-07-22 ENCOUNTER — Encounter (HOSPITAL_COMMUNITY): Payer: Self-pay | Admitting: *Deleted

## 2021-07-22 ENCOUNTER — Emergency Department (HOSPITAL_COMMUNITY)
Admission: EM | Admit: 2021-07-22 | Discharge: 2021-07-22 | Disposition: A | Discharge status: Home / Self Care | Payer: Medicare HMO | Attending: Emergency Medicine | Admitting: Emergency Medicine

## 2021-07-22 ENCOUNTER — Emergency Department (HOSPITAL_COMMUNITY): Payer: Medicare HMO

## 2021-07-22 ENCOUNTER — Other Ambulatory Visit: Payer: Self-pay

## 2021-07-22 DIAGNOSIS — Z5321 Procedure and treatment not carried out due to patient leaving prior to being seen by health care provider: Secondary | ICD-10-CM | POA: Diagnosis not present

## 2021-07-22 DIAGNOSIS — R059 Cough, unspecified: Secondary | ICD-10-CM | POA: Diagnosis not present

## 2021-07-22 DIAGNOSIS — R0981 Nasal congestion: Secondary | ICD-10-CM | POA: Insufficient documentation

## 2021-07-22 NOTE — ED Triage Notes (Signed)
Pt states she started having a cough and congestion x one hour pta

## 2021-07-23 ENCOUNTER — Other Ambulatory Visit: Payer: Self-pay | Admitting: Nurse Practitioner

## 2021-07-23 ENCOUNTER — Encounter: Payer: Self-pay | Admitting: Nurse Practitioner

## 2021-07-23 ENCOUNTER — Ambulatory Visit (INDEPENDENT_AMBULATORY_CARE_PROVIDER_SITE_OTHER): Payer: Medicare HMO

## 2021-07-23 ENCOUNTER — Telehealth: Payer: Self-pay | Admitting: Family Medicine

## 2021-07-23 ENCOUNTER — Ambulatory Visit (INDEPENDENT_AMBULATORY_CARE_PROVIDER_SITE_OTHER): Payer: Medicare HMO | Admitting: Nurse Practitioner

## 2021-07-23 VITALS — BP 136/82 | HR 80 | Temp 96.3°F | Ht 70.0 in | Wt 248.8 lb

## 2021-07-23 DIAGNOSIS — J069 Acute upper respiratory infection, unspecified: Secondary | ICD-10-CM

## 2021-07-23 DIAGNOSIS — J449 Chronic obstructive pulmonary disease, unspecified: Secondary | ICD-10-CM

## 2021-07-23 DIAGNOSIS — R062 Wheezing: Secondary | ICD-10-CM | POA: Diagnosis not present

## 2021-07-23 DIAGNOSIS — G35 Multiple sclerosis: Secondary | ICD-10-CM | POA: Diagnosis not present

## 2021-07-23 LAB — VERITOR FLU A/B WAIVED
Influenza A: NEGATIVE
Influenza B: NEGATIVE

## 2021-07-23 MED ORDER — ALBUTEROL SULFATE (2.5 MG/3ML) 0.083% IN NEBU: 2.5000 mg | INHALATION_SOLUTION | Freq: Four times a day (QID) | RESPIRATORY_TRACT | 1 refills | Status: DC | PRN

## 2021-07-23 MED ORDER — AZITHROMYCIN 250 MG PO TABS: ORAL_TABLET | ORAL | 0 refills | Status: AC

## 2021-07-23 NOTE — Patient Instructions (Addendum)

## 2021-07-23 NOTE — Telephone Encounter (Signed)
Pt aware abx was sent to phamacy. I sent in refill of her Albuterol neb sol  She asked about the Prednisone that was discussed during the visit Please advise

## 2021-07-23 NOTE — Assessment & Plan Note (Signed)
Take meds as prescribed - Use a cool mist humidifier  -Use saline nose sprays frequently -Force fluids -For fever or aches or pains- take Tylenol or ibuprofen. -COVID-19/flu swab completed results pending. Follow up with worsening unresolved symptoms

## 2021-07-23 NOTE — Addendum Note (Signed)
Addended by: Ivy Lynn on: 07/23/2021 04:51 PM   Modules accepted: Orders

## 2021-07-23 NOTE — Progress Notes (Signed)
Acute Office Visit  Subjective:    Patient ID: [name], female    DOB: 1956/05/22, 65 y.o.   MRN: [id number]  Chief Complaint  Patient presents with   Cough    Green phlegm   Shortness of Breath    Started yesterday      Cough This is a recurrent problem. The current episode started in the past 7 days. The problem has been gradually worsening. The problem occurs constantly. The cough is Productive of sputum. Associated symptoms include headaches, nasal congestion and shortness of breath. Pertinent negatives include no chest pain or sore throat. She has tried nothing for the symptoms. Her past medical history is significant for COPD.  Shortness of Breath This is a recurrent problem. The current episode started in the past 7 days. The problem occurs constantly. The problem has been unchanged. Associated symptoms include headaches. Pertinent negatives include no chest pain or sore throat. She has tried nothing for the symptoms. Her past medical history is significant for COPD.     Past Medical History:  Diagnosis Date   Anxiety    Arthritis    COPD (chronic obstructive pulmonary disease) (HCC)    GERD (gastroesophageal reflux disease)    History of skin cancer    Migraine    Mixed hyperlipidemia    Multiple sclerosis (HCC)    Left-sided weakness   Neuropathy    Pre-diabetes    Psoriasis    Seasonal allergies     Past Surgical History:  Procedure Laterality Date   ABDOMINAL HYSTERECTOMY     CHOLECYSTECTOMY     COLONOSCOPY N/A 08/02/2015   Procedure: COLONOSCOPY;  Surgeon: Rogene Houston, MD;  Location: [facility];  Service: Endoscopy;  Laterality: N/A;  12:00-moved to 1220 Ann to notify pt   KNEE ARTHROSCOPY     RT   LAPAROSCOPIC APPENDECTOMY N/A 02/12/2017   Procedure: APPENDECTOMY LAPAROSCOPIC;  Surgeon: Aviva Signs, MD;  Location: [facility];  Service: General;  Laterality: N/A;   Left breast lumpectomy     Left carpal tunnel release     PARTIAL KNEE  ARTHROPLASTY Left 01/22/2015   Procedure: LEFT KNEE MEDIAL UNICOMPARTMENTAL KNEE;  Surgeon: Gaynelle Arabian, MD;  Location: [facility];  Service: Orthopedics;  Laterality: Left;   Right total knee replacement  2010   SHOULDER SURGERY Left    Sinus cyst removal     TOTAL KNEE REVISION  05/05/2012   Procedure: TOTAL KNEE REVISION;  Surgeon: Gearlean Alf, MD;  Location: [facility];  Service: Orthopedics;  Laterality: Right;   WRIST SURGERY     rt    Family History  Problem Relation Age of Onset   Dementia Mother    Thyroid disease Mother        goiter    Diabetes Mother    Atrial fibrillation Father        Died with pulmonary embolus   Emphysema Father    Heart disease Father    Other Father        blood clots    Celiac disease Father    GI problems Father        dumping syndrome - malabsorbtion    Cancer - Colon Maternal Grandmother    Dementia Maternal Grandmother    Heart disease Maternal Grandfather    Stroke Maternal Grandfather    Other Sister        hereditary = blood clots    Parkinson's disease Brother    Alcohol abuse  Brother    Arthritis Son    Heart attack Paternal Grandmother    Other Paternal Grandfather        appendix rupture   Hypertension Brother    Hypertension Son     Social History   Socioeconomic History   Marital status: Married    Spouse name: Audry Pili    Number of children: 2   Years of education: Not on file   Highest education level: Some college, no degree  Occupational History   Occupation: diabled    Comment: teller - Estate manager/land agent  Tobacco Use   Smoking status: Every Day    Packs/day: 0.50    Years: 40.00    Pack years: 20.00    Types: Cigarettes    Start date: 06/16/1972   Smokeless tobacco: Never   Tobacco comments:    3/4 pack a day sicne age 43  Vaping Use   Vaping Use: Never used  Substance and Sexual Activity   Alcohol use: No    Alcohol/week: 0.0 standard drinks   Drug use: No   Sexual activity: Not on file  Other Topics  Concern   Not on file  Social History Narrative   Disabled, married, lives with husband [place]. 2 children  - 9 grandchildren, 2 dogs   Children live within 10 miles   Social Determinants of Health   Financial Resource Strain: Low Risk    Difficulty of Paying Living Expenses: Not hard at all  Food Insecurity: No Food Insecurity   Worried About Charity fundraiser in the Last Year: Never true   Arboriculturist in the Last Year: Never true  Transportation Needs: No Transportation Needs   Lack of Transportation (Medical): No   Lack of Transportation (Non-Medical): No  Physical Activity: Insufficiently Active   Days of Exercise per Week: 3 days   Minutes of Exercise per Session: 20 min  Stress: No Stress Concern Present   Feeling of Stress : Only a little  Social Connections: Engineer, building services of Communication with Friends and Family: More than three times a week   Frequency of Social Gatherings with Friends and Family: Once a week   Attends Religious Services: More than 4 times per year   Active Member of [employer] or Organizations: Yes   Attends Music therapist: More than 4 times per year   Marital Status: Married  Human resources officer Violence: Not At Risk   Fear of Current or Ex-Partner: No   Emotionally Abused: No   Physically Abused: No   Sexually Abused: No    Outpatient Medications Prior to Visit  Medication Sig Dispense Refill   albuterol (PROVENTIL) (2.5 MG/3ML) 0.083% nebulizer solution Take 3 mLs (2.5 mg total) by nebulization every 6 (six) hours as needed for wheezing or shortness of breath. 150 mL 1   albuterol (VENTOLIN HFA) 108 (90 Base) MCG/ACT inhaler Inhale 2 puffs into the lungs every 6 (six) hours as needed. For shortness of breath 1 Inhaler 1   budesonide-formoterol (SYMBICORT) 160-4.5 MCG/ACT inhaler Take 2 puffs first thing in am and then another 2 puffs about 12 hours later. 10.2 g 11   citalopram (CELEXA) 40 MG tablet Take 1 tablet (40  mg total) by mouth daily. 90 tablet 3   gabapentin (NEURONTIN) 600 MG tablet Take 600 mg by mouth in the morning, at noon, in the evening, and at bedtime.     Melatonin 10 MG TABS Take 1 tablet by mouth at  bedtime.     ondansetron (ZOFRAN) 4 MG tablet Take 1 tablet (4 mg total) by mouth every 8 (eight) hours as needed for nausea or vomiting. 20 tablet 0   pantoprazole (PROTONIX) 40 MG tablet Take 1 tablet (40 mg total) by mouth daily. 90 tablet 3   rosuvastatin (CRESTOR) 5 MG tablet Take 1 tablet (5 mg total) by mouth daily. 90 tablet 3   tiotropium (SPIRIVA HANDIHALER) 18 MCG inhalation capsule Place 1 capsule (18 mcg total) into inhaler and inhale daily. 90 capsule 3   triamcinolone cream (KENALOG) 0.1 %      dicyclomine (BENTYL) 10 MG capsule Take 1 capsule (10 mg total) by mouth 4 (four) times daily -  before meals and at bedtime. (Patient not taking: Reported on 07/23/2021) 90 capsule 3   Facility-Administered Medications Prior to Visit  Medication Dose Route Frequency Provider Last Rate Last Admin   cyanocobalamin ((VITAMIN B-12)) injection 1,000 mcg  1,000 mcg Intramuscular Q30 days [name], [name], MD   1,000 mcg at 07/03/21 1520    Allergies  Allergen Reactions   Fenofibrate Other (See Comments)    Flu-like    Penicillin G Other (See Comments)    Blisters in mouth   Pravastatin Other (See Comments)    "flu like symptoms"    Statins     Review of Systems  Constitutional: Negative.   HENT: Negative.  Negative for sore throat.   Eyes: Negative.   Respiratory:  Positive for cough and shortness of breath.   Cardiovascular:  Negative for chest pain.  Gastrointestinal: Negative.   Neurological:  Positive for headaches.  All other systems reviewed and are negative.     Objective:    Physical Exam Vitals reviewed.  Constitutional:      Appearance: Normal appearance. She is well-developed.  HENT:     Head: Normocephalic.     Right Ear: External ear normal.     Nose:  Congestion present.     Mouth/Throat:     Mouth: Mucous membranes are moist.  Eyes:     Pupils: Pupils are equal, round, and reactive to light.  Cardiovascular:     Rate and Rhythm: Normal rate and regular rhythm.     Pulses: Normal pulses.     Heart sounds: Normal heart sounds.  Pulmonary:     Effort: Pulmonary effort is normal. No respiratory distress.     Breath sounds: Normal breath sounds.  Abdominal:     General: Bowel sounds are normal.  Musculoskeletal:        General: Normal range of motion.  Skin:    General: Skin is warm.     Findings: No rash.  Neurological:     Mental Status: She is alert and oriented to person, place, and time.  Psychiatric:        Mood and Affect: Mood normal.        Behavior: Behavior normal.    BP 136/82    Pulse 80    Temp (!) 96.3 F (35.7 C) (Temporal)    Ht 5' 10"  (1.778 m)    Wt 248 lb 12.8 oz (112.9 kg)    SpO2 95%    BMI 35.70 kg/m  Wt Readings from Last 3 Encounters:  07/23/21 248 lb 12.8 oz (112.9 kg)  07/22/21 250 lb (113.4 kg)  04/25/21 249 lb (112.9 kg)    Health Maintenance Due  Topic Date Due   Zoster Vaccines- Shingrix (1 of 2) Never done   OPHTHALMOLOGY  EXAM  12/20/2020   FOOT EXAM  05/09/2021   URINE MICROALBUMIN  05/09/2021    There are no preventive care reminders to display for this patient.   Lab Results  Component Value Date   TSH 2.970 02/06/2020   Lab Results  Component Value Date   WBC 11.3 (H) 03/26/2021   HGB 13.4 03/26/2021   HCT 40.3 03/26/2021   MCV 89 03/26/2021   PLT 278 03/26/2021   Lab Results  Component Value Date   NA 144 03/26/2021   K 4.4 03/26/2021   CO2 24 03/26/2021   GLUCOSE 154 (H) 03/26/2021   BUN 10 03/26/2021   CREATININE 0.84 03/26/2021   BILITOT 0.2 03/26/2021   ALKPHOS 82 03/26/2021   AST 11 03/26/2021   ALT 11 03/26/2021   PROT 6.1 03/26/2021   ALBUMIN 3.9 03/26/2021   CALCIUM 9.1 03/26/2021   ANIONGAP 6 02/13/2017   EGFR 79 03/26/2021   Lab Results   Component Value Date   CHOL 192 07/26/2020   Lab Results  Component Value Date   HDL 51 07/26/2020   Lab Results  Component Value Date   LDLCALC 115 (H) 07/26/2020   Lab Results  Component Value Date   TRIG 147 07/26/2020   Lab Results  Component Value Date   CHOLHDL 3.8 07/26/2020   Lab Results  Component Value Date   HGBA1C 6.9 (H) 04/25/2021       Assessment & Plan:   Problem List Items Addressed This Visit       Respiratory   Upper respiratory tract infection - Primary    Take meds as prescribed - Use a cool mist humidifier  -Use saline nose sprays frequently -Force fluids -For fever or aches or pains- take Tylenol or ibuprofen. -COVID-19/flu swab completed results pending. Follow up with worsening unresolved symptoms      Relevant Orders   Veritor Flu A/B Waived (Completed)   Novel Coronavirus, NAA ([facility])   DG Chest 2 View (Completed)     No orders of the defined types were placed in this encounter.    Ivy Lynn, NP

## 2021-07-23 NOTE — Telephone Encounter (Signed)
Where you going to call in something?

## 2021-07-24 LAB — NOVEL CORONAVIRUS, NAA: SARS-CoV-2, NAA: NOT DETECTED

## 2021-07-24 NOTE — Telephone Encounter (Signed)
Patient aware.

## 2021-07-25 ENCOUNTER — Other Ambulatory Visit: Payer: Self-pay | Admitting: Family Medicine

## 2021-07-25 DIAGNOSIS — J449 Chronic obstructive pulmonary disease, unspecified: Secondary | ICD-10-CM

## 2021-07-25 MED ORDER — METHYLPREDNISOLONE 4 MG PO TBPK: ORAL_TABLET | ORAL | 0 refills | Status: DC

## 2021-07-26 ENCOUNTER — Ambulatory Visit: Payer: Medicare HMO | Admitting: Family Medicine

## 2021-07-26 ENCOUNTER — Other Ambulatory Visit: Payer: Self-pay | Admitting: Family Medicine

## 2021-07-26 ENCOUNTER — Telehealth: Payer: Self-pay

## 2021-07-26 DIAGNOSIS — J069 Acute upper respiratory infection, unspecified: Secondary | ICD-10-CM

## 2021-07-26 MED ORDER — BENZONATATE 100 MG PO CAPS: 100.0000 mg | ORAL_CAPSULE | Freq: Three times a day (TID) | ORAL | 0 refills | Status: DC | PRN

## 2021-07-26 NOTE — Telephone Encounter (Signed)
No problem.  Tessalon perles sent.  Please have her follow up in office if symptoms continue to worsen.

## 2021-07-26 NOTE — Telephone Encounter (Signed)
Pt only feeling some better. He Covid and Flu swabs were negative. Chest xray normal.  She will finish ATB tomorrow and started Prednisone today.  She c/o a non productive cough constantly. Her ribs hurt and she cannot sleep.Pt also has lots of head congestion.  Advised pt to continue pushing fluids, try Mucinex, Flonase. Also, recommended Delsym OTC. She would still like a prescription cough medication.  Dr. Darnell Level,   Pt's original appt was with Je. She is off and [name] called in sick. Please review for prescription cough medication. [facility] [place].

## 2021-07-26 NOTE — Telephone Encounter (Signed)
Pt informed and appreciative.

## 2021-08-02 ENCOUNTER — Ambulatory Visit (INDEPENDENT_AMBULATORY_CARE_PROVIDER_SITE_OTHER): Payer: Medicare HMO

## 2021-08-02 DIAGNOSIS — E538 Deficiency of other specified B group vitamins: Secondary | ICD-10-CM

## 2021-08-02 NOTE — Progress Notes (Signed)
B12 injection given to patient and tolerated well.  

## 2021-08-08 ENCOUNTER — Ambulatory Visit (INDEPENDENT_AMBULATORY_CARE_PROVIDER_SITE_OTHER): Payer: Medicare HMO | Admitting: Gastroenterology

## 2021-08-08 ENCOUNTER — Other Ambulatory Visit: Payer: Self-pay

## 2021-08-08 ENCOUNTER — Encounter (INDEPENDENT_AMBULATORY_CARE_PROVIDER_SITE_OTHER): Payer: Self-pay | Admitting: Gastroenterology

## 2021-08-08 DIAGNOSIS — R109 Unspecified abdominal pain: Secondary | ICD-10-CM

## 2021-08-08 DIAGNOSIS — R197 Diarrhea, unspecified: Secondary | ICD-10-CM | POA: Diagnosis not present

## 2021-08-08 NOTE — Progress Notes (Signed)
[name], M.D. Gastroenterology & Hepatology [facility] [address] [place], [place] [address] Primary Care Physician: [name], [name], MD [place] [address]  Referring MD: PCP  Chief Complaint:  Abdominal pain and changes in bowel movements  History of Present Illness: LICET DUNPHY is a 65 y.o. female with past medical history of anxiety, COPD, GERD, hyperlipidemia, multiple sclerosis, prediabetes and migraines, who presents for evaluation of abdominal pain and changes in bowel movements.  Patient states for the last 6 months she has noticed a change in her bowel movements - she states sometimes her stools have pellet or pencil shape, but other days she notices having episodes of watery diarrhea. States that she can have 1-3 bowel movements per day. Sometimes she feels some "mucousy feeling" and when she wipes she notices some watery stool but no incontinence or stool in underwear. She is concerned as she used to have regular Bms prior to this and has a family history of celiac disease. Tries Imdoium 2-3 tablets sometimes but states she will have diarrhea a few hours later.  She has presented intermittent episodes of abdominal pain in her mid abdominal area, occasionally has presented radiation of the pain to her epigastric area. She states she had a short spell of cough during Thanksgiving with associated stabbing pain in the LUQ but it has now resolved. She has not identified any specific triggers for her pain.  Occasionally can have abdominal bloating.  Has not started any new medications recently or any other OTC supplements. Has been on Protonix for multiple years.  She was on metformin before her symptoms started so she stopped it, but did not have any improvement in her symptoms. Was given a prescription for Bentyl but did not tolerate it  - had lightheadedness and stopped the medication.  Recently, she  had negative FOBT at her PCP's office.  The patient denies having any nausea, vomiting, fever, chills, hematochezia, melena, hematemesis, abdominal distention, jaundice, pruritus or weight loss.  Most recent abdominal imaging from 2018 CT of the abdomen and pelvis with IV contrast that showed changes of mild appendicitis and a tiny hiatal hernia.  Last QRF:[id number] 12 years ago, no report available, patient states it was normal Last Colonoscopy:2016 Prep excellent. Normal mucosa of cecum, ascending colon, hepatic flexure, transverse colon, splenic flexure, descending and sigmoid colon. Normal rectal mucosa. Small hemorrhoids below the dentate line along with tiny anal papilla.  FHx: son and father had celiac disease, neg for any gastrointestinal/liver disease, grandmother colon cancer, uncle esophageal cancer Social: smokes half a pack a day, neg alcohol or illicit drug use Surgical: hysterectomy, appendectomy, cholecystectomy  Past Medical History: Past Medical History:  Diagnosis Date   Anxiety    Arthritis    COPD (chronic obstructive pulmonary disease) ([place])    GERD (gastroesophageal reflux disease)    History of skin cancer    Migraine    Mixed hyperlipidemia    Multiple sclerosis (HCC)    Left-sided weakness   Neuropathy    Pre-diabetes    Psoriasis    Seasonal allergies     Past Surgical History: Past Surgical History:  Procedure Laterality Date   ABDOMINAL HYSTERECTOMY     CHOLECYSTECTOMY     COLONOSCOPY N/A 08/02/2015   Procedure: COLONOSCOPY;  Surgeon: Rogene Houston, MD;  Location: [facility];  Service: Endoscopy;  Laterality: N/A;  12:00-moved to 1220 Ann to notify pt   KNEE ARTHROSCOPY  RT   LAPAROSCOPIC APPENDECTOMY N/A 02/12/2017   Procedure: APPENDECTOMY LAPAROSCOPIC;  Surgeon: Aviva Signs, MD;  Location: [facility] ORS;  Service: General;  Laterality: N/A;   Left breast lumpectomy     Left carpal tunnel release     PARTIAL KNEE ARTHROPLASTY Left  01/22/2015   Procedure: LEFT KNEE MEDIAL UNICOMPARTMENTAL KNEE;  Surgeon: Gaynelle Arabian, MD;  Location: [facility];  Service: Orthopedics;  Laterality: Left;   Right total knee replacement  2010   SHOULDER SURGERY Left    Sinus cyst removal     TOTAL KNEE REVISION  05/05/2012   Procedure: TOTAL KNEE REVISION;  Surgeon: Gearlean Alf, MD;  Location: [facility];  Service: Orthopedics;  Laterality: Right;   WRIST SURGERY     rt    Family History: Family History  Problem Relation Age of Onset   Dementia Mother    Thyroid disease Mother        goiter    Diabetes Mother    Atrial fibrillation Father        Died with pulmonary embolus   Emphysema Father    Heart disease Father    Other Father        blood clots    Celiac disease Father    GI problems Father        dumping syndrome - malabsorbtion    Cancer - Colon Maternal Grandmother    Dementia Maternal Grandmother    Heart disease Maternal Grandfather    Stroke Maternal Grandfather    Other Sister        hereditary = blood clots    Parkinson's disease Brother    Alcohol abuse Brother    Arthritis Son    Heart attack Paternal Grandmother    Other Paternal Grandfather        appendix rupture   Hypertension Brother    Hypertension Son     Social History: Social History   Tobacco Use  Smoking Status Every Day   Packs/day: 0.50   Years: 40.00   Pack years: 20.00   Types: Cigarettes   Start date: 06/16/1972  Smokeless Tobacco Never  Tobacco Comments   3/4 pack a day sicne age 60   Social History   Substance and Sexual Activity  Alcohol Use No   Alcohol/week: 0.0 standard drinks   Social History   Substance and Sexual Activity  Drug Use No    Allergies: Allergies  Allergen Reactions   Fenofibrate Other (See Comments)    Flu-like    Penicillin G Other (See Comments)    Blisters in mouth   Pravastatin Other (See Comments)    "flu like symptoms"    Statins     Medications: Current Outpatient Medications   Medication Sig Dispense Refill   albuterol (PROVENTIL) (2.5 MG/3ML) 0.083% nebulizer solution Take 3 mLs (2.5 mg total) by nebulization every 6 (six) hours as needed for wheezing or shortness of breath. 150 mL 1   albuterol (VENTOLIN HFA) 108 (90 Base) MCG/ACT inhaler Inhale 2 puffs into the lungs every 6 (six) hours as needed. For shortness of breath 1 Inhaler 1   budesonide-formoterol (SYMBICORT) 160-4.5 MCG/ACT inhaler Take 2 puffs first thing in am and then another 2 puffs about 12 hours later. 10.2 g 11   citalopram (CELEXA) 40 MG tablet Take 1 tablet (40 mg total) by mouth daily. 90 tablet 3   gabapentin (NEURONTIN) 600 MG tablet Take 600 mg by mouth in the morning, at noon, in the evening,  and at bedtime.     Melatonin 10 MG TABS Take 1 tablet by mouth at bedtime.     pantoprazole (PROTONIX) 40 MG tablet Take 1 tablet (40 mg total) by mouth daily. 90 tablet 3   rosuvastatin (CRESTOR) 5 MG tablet Take 1 tablet (5 mg total) by mouth daily. (Patient taking differently: Take 5 mg by mouth daily. Takes one every other day) 90 tablet 3   tiotropium (SPIRIVA HANDIHALER) 18 MCG inhalation capsule Place 1 capsule (18 mcg total) into inhaler and inhale daily. 90 capsule 3   triamcinolone cream (KENALOG) 0.1 %      Current Facility-Administered Medications  Medication Dose Route Frequency Provider Last Rate Last Admin   cyanocobalamin ((VITAMIN B-12)) injection 1,000 mcg  1,000 mcg Intramuscular Q30 days [name], [name], MD   1,000 mcg at 08/02/21 1028    Review of Systems: GENERAL: negative for malaise, night sweats HEENT: No changes in hearing or vision, no nose bleeds or other nasal problems. NECK: Negative for lumps, goiter, pain and significant neck swelling RESPIRATORY: Negative for cough, wheezing CARDIOVASCULAR: Negative for chest pain, leg swelling, palpitations, orthopnea GI: SEE HPI MUSCULOSKELETAL: Negative for joint pain or swelling, back pain, and muscle pain. SKIN: Negative  for lesions, rash PSYCH: Negative for sleep disturbance, mood disorder and recent psychosocial stressors. HEMATOLOGY Negative for prolonged bleeding, bruising easily, and swollen nodes. ENDOCRINE: Negative for cold or heat intolerance, polyuria, polydipsia and goiter. NEURO: negative for tremor, gait imbalance, syncope and seizures. The remainder of the review of systems is noncontributory.   Physical Exam: BP 125/87 (BP Location: Right Arm, Patient Position: Sitting, Cuff Size: Large)    Pulse 87    Temp 98.7 F (37.1 C) (Oral)    Ht 5\' 10"  (1.778 m)    Wt 250 lb (113.4 kg)    BMI 35.87 kg/m  GENERAL: The patient is AO x3, in no acute distress. HEENT: Head is normocephalic and atraumatic. EOMI are intact. Mouth is well hydrated and without lesions. NECK: Supple. No masses LUNGS: Clear to auscultation. No presence of rhonchi/wheezing/rales. Adequate chest expansion HEART: RRR, normal s1 and s2. ABDOMEN: mildly tender upon palpation of the epigastric area, no guarding, no peritoneal signs, and nondistended. BS +. No masses. EXTREMITIES: Without any cyanosis, clubbing, rash, lesions or edema. NEUROLOGIC: AOx3, no focal motor deficit. SKIN: no jaundice, no rashes   Imaging/Labs: as above  I personally reviewed and interpreted the available labs, imaging and endoscopic files.  Impression and Plan: LAURABETH YIP is a 65 y.o. female with past medical history of anxiety, COPD, GERD, hyperlipidemia, multiple sclerosis, prediabetes and migraines, who presents for evaluation of abdominal pain and changes in bowel movements.  The patient presented relatively new onset of symptoms of watery bowel movements and occasional abdominal pain without radiation.  She did not start any new medications prior to this.  Has not had any other red flag signs.  We will evaluate her symptoms further with CBC, CMP, lipid panel and TSH, as well as stool testing for infectious etiologies.  For now, she can take  Imodium as needed for diarrhea to improve her symptoms.  We had a discussion about the potential role of a repeat colonoscopy to evaluate her diarrhea further, depending on the results of the most recent labs.  Patient understood and agreed.  - Perform blood and stool workup -Continue Imodium as needed for diarrhea - May need to proceed with colonoscopy depending on lab results  All questions were answered.  Michelle Peppers, MD Gastroenterology and Hepatology [facility]

## 2021-08-08 NOTE — Patient Instructions (Signed)
Perform blood and stool workup Continue Imodium as needed for diarrhea

## 2021-08-09 DIAGNOSIS — R197 Diarrhea, unspecified: Secondary | ICD-10-CM | POA: Diagnosis not present

## 2021-08-11 HISTORY — PX: WRIST SURGERY: SHX841

## 2021-08-13 LAB — COMPREHENSIVE METABOLIC PANEL
AG Ratio: 1.9 (calc) (ref 1.0–2.5)
ALT: 9 U/L (ref 6–29)
AST: 7 U/L — ABNORMAL LOW (ref 10–35)
Albumin: 3.7 g/dL (ref 3.6–5.1)
Alkaline phosphatase (APISO): 69 U/L (ref 37–153)
BUN: 9 mg/dL (ref 7–25)
CO2: 28 mmol/L (ref 20–32)
Calcium: 8.8 mg/dL (ref 8.6–10.4)
Chloride: 104 mmol/L (ref 98–110)
Creat: 0.85 mg/dL (ref 0.50–1.05)
Globulin: 2 g/dL (calc) (ref 1.9–3.7)
Glucose, Bld: 163 mg/dL — ABNORMAL HIGH (ref 65–139)
Potassium: 4 mmol/L (ref 3.5–5.3)
Sodium: 140 mmol/L (ref 135–146)
Total Bilirubin: 0.5 mg/dL (ref 0.2–1.2)
Total Protein: 5.7 g/dL — ABNORMAL LOW (ref 6.1–8.1)

## 2021-08-13 LAB — CBC WITH DIFFERENTIAL/PLATELET
Absolute Monocytes: 469 cells/uL (ref 200–950)
Basophils Absolute: 51 cells/uL (ref 0–200)
Basophils Relative: 0.5 %
Eosinophils Absolute: 112 cells/uL (ref 15–500)
Eosinophils Relative: 1.1 %
HCT: 41.3 % (ref 35.0–45.0)
Hemoglobin: 13.4 g/dL (ref 11.7–15.5)
Lymphs Abs: 2683 cells/uL (ref 850–3900)
MCH: 28.8 pg (ref 27.0–33.0)
MCHC: 32.4 g/dL (ref 32.0–36.0)
MCV: 88.8 fL (ref 80.0–100.0)
MPV: 11.2 fL (ref 7.5–12.5)
Monocytes Relative: 4.6 %
Neutro Abs: 6885 cells/uL (ref 1500–7800)
Neutrophils Relative %: 67.5 %
Platelets: 265 10*3/uL (ref 140–400)
RBC: 4.65 10*6/uL (ref 3.80–5.10)
RDW: 12.2 % (ref 11.0–15.0)
Total Lymphocyte: 26.3 %
WBC: 10.2 10*3/uL (ref 3.8–10.8)

## 2021-08-13 LAB — GASTROINTESTINAL PATHOGEN PANEL PCR
C. difficile Tox A/B, PCR: DETECTED — AB
Campylobacter, PCR: UNDETERMINED — AB
Cryptosporidium, PCR: UNDETERMINED — AB
E coli (ETEC) LT/ST PCR: UNDETERMINED — AB
E coli (STEC) stx1/stx2, PCR: UNDETERMINED — AB
E coli 0157, PCR: UNDETERMINED — AB
Giardia lamblia, PCR: UNDETERMINED — AB
Norovirus, PCR: UNDETERMINED — AB
Rotavirus A, PCR: UNDETERMINED — AB
Salmonella, PCR: UNDETERMINED — AB
Shigella, PCR: UNDETERMINED — AB

## 2021-08-13 LAB — OVA AND PARASITE EXAMINATION
CONCENTRATE RESULT:: NONE SEEN
MICRO NUMBER:: 12813752
SPECIMEN QUALITY:: ADEQUATE
TRICHROME RESULT:: NONE SEEN

## 2021-08-13 LAB — CELIAC DISEASE PANEL
(tTG) Ab, IgA: <1 U/mL
(tTG) Ab, IgG: <1 U/mL
Gliadin IgA: 5.9 U/mL
Gliadin IgG: <1 U/mL
Immunoglobulin A: 186 mg/dL (ref 70–320)

## 2021-08-13 LAB — C. DIFFICILE GDH AND TOXIN A/B
GDH ANTIGEN: NOT DETECTED
MICRO NUMBER:: 12813751
SPECIMEN QUALITY:: ADEQUATE
TOXIN A AND B: NOT DETECTED

## 2021-08-13 LAB — TSH: TSH: 3.34 mIU/L (ref 0.40–4.50)

## 2021-08-15 ENCOUNTER — Other Ambulatory Visit: Payer: Self-pay | Admitting: Family Medicine

## 2021-08-15 DIAGNOSIS — M25552 Pain in left hip: Secondary | ICD-10-CM | POA: Diagnosis not present

## 2021-08-15 DIAGNOSIS — J449 Chronic obstructive pulmonary disease, unspecified: Secondary | ICD-10-CM

## 2021-08-15 DIAGNOSIS — M25551 Pain in right hip: Secondary | ICD-10-CM | POA: Diagnosis not present

## 2021-08-19 ENCOUNTER — Encounter (HOSPITAL_COMMUNITY): Payer: Self-pay

## 2021-08-19 NOTE — Progress Notes (Signed)
Attempted to reach patient regarding LCS. Unable to reach patient at this time.

## 2021-08-21 DIAGNOSIS — M25551 Pain in right hip: Secondary | ICD-10-CM | POA: Diagnosis not present

## 2021-08-21 DIAGNOSIS — M25552 Pain in left hip: Secondary | ICD-10-CM | POA: Diagnosis not present

## 2021-08-22 ENCOUNTER — Encounter: Payer: Self-pay | Admitting: Family Medicine

## 2021-08-22 ENCOUNTER — Ambulatory Visit (INDEPENDENT_AMBULATORY_CARE_PROVIDER_SITE_OTHER): Payer: Medicare HMO | Admitting: Family Medicine

## 2021-08-22 VITALS — BP 132/81 | HR 76 | Ht 70.0 in | Wt 249.0 lb

## 2021-08-22 DIAGNOSIS — E1169 Type 2 diabetes mellitus with other specified complication: Secondary | ICD-10-CM | POA: Diagnosis not present

## 2021-08-22 DIAGNOSIS — E782 Mixed hyperlipidemia: Secondary | ICD-10-CM | POA: Diagnosis not present

## 2021-08-22 DIAGNOSIS — J449 Chronic obstructive pulmonary disease, unspecified: Secondary | ICD-10-CM | POA: Diagnosis not present

## 2021-08-22 DIAGNOSIS — E119 Type 2 diabetes mellitus without complications: Secondary | ICD-10-CM | POA: Diagnosis not present

## 2021-08-22 LAB — BAYER DCA HB A1C WAIVED: HB A1C (BAYER DCA - WAIVED): 7.6 % — ABNORMAL HIGH (ref 4.8–5.6)

## 2021-08-22 MED ORDER — METHYLPREDNISOLONE ACETATE 40 MG/ML IJ SUSP
80.0000 mg | Freq: Once | INTRAMUSCULAR | Status: AC
  Administered 2021-08-22: 80 mg via INTRAMUSCULAR

## 2021-08-22 NOTE — Progress Notes (Signed)
BP 132/81    Pulse 76    Ht 5\' 10"  (1.778 m)    Wt 249 lb (112.9 kg)    SpO2 97%    BMI 35.73 kg/m    Subjective:   Patient ID: [name], female    DOB: May 02, 1956, 66 y.o.   MRN: [id number]  HPI: [name] is a 66 y.o. female presenting on 08/22/2021 for Medical Management of Chronic Issues and Diabetes   HPI Hyperlipidemia Patient is coming in for recheck of his hyperlipidemia. The patient is currently taking Crestor. They deny any issues with myalgias or history of liver damage from it. They deny any focal numbness or weakness or chest pain.   Type 2 diabetes mellitus Patient comes in today for recheck of his diabetes. Patient has been currently taking no medication, diet controlled. Patient is not currently on an ACE inhibitor/ARB. Patient has seen an ophthalmologist this year. Patient denies any issues with their feet. The symptom started onset as an adult hyperlipidemia ARE RELATED TO DM   COPD Patient is coming in for COPD recheck today.  She is having more cough and congestion and wheezing and has been increased and she is having it since about the past couple weeks since she was sick.  She is using her inhalers and they do help but are just not clearing it.  She has been taking a cough medicine and she did azithromycin and short course of steroids but it does not seem to be clearing.  Relevant past medical, surgical, family and social history reviewed and updated as indicated. Interim medical history since our last visit reviewed. Allergies and medications reviewed and updated.  Review of Systems  Constitutional:  Negative for chills and fever.  Eyes:  Negative for visual disturbance.  Respiratory:  Negative for chest tightness and shortness of breath.   Cardiovascular:  Negative for chest pain and leg swelling.  Musculoskeletal:  Negative for back pain and gait problem.  Skin:  Negative for rash.  Neurological:  Negative for dizziness, light-headedness and  headaches.  Psychiatric/Behavioral:  Negative for agitation and behavioral problems.   All other systems reviewed and are negative.  Per HPI unless specifically indicated above   Allergies as of 08/22/2021       Reactions   Fenofibrate Other (See Comments)   Flu-like    Penicillin G Other (See Comments)   Blisters in mouth   Pravastatin Other (See Comments)   "flu like symptoms"    Statins         Medication List        Accurate as of August 22, 2021  4:16 PM. If you have any questions, ask your nurse or doctor.          albuterol 108 (90 Base) MCG/ACT inhaler Commonly known as: Ventolin HFA Inhale 2 puffs into the lungs every 6 (six) hours as needed. For shortness of breath   albuterol (2.5 MG/3ML) 0.083% nebulizer solution Commonly known as: PROVENTIL Take 3 mLs (2.5 mg total) by nebulization every 6 (six) hours as needed for wheezing or shortness of breath.   budesonide-formoterol 160-4.5 MCG/ACT inhaler Commonly known as: Symbicort USE 2 PUFFS FIRST THING IN THE MORNING &USE 2 PUFFS 12 HOURS LATER   citalopram 40 MG tablet Commonly known as: CELEXA Take 1 tablet (40 mg total) by mouth daily.   gabapentin 600 MG tablet Commonly known as: NEURONTIN TAKE ONE (1) TABLET THREE (3) TIMES EACH DAY   Melatonin 10  MG Tabs Take 1 tablet by mouth at bedtime.   pantoprazole 40 MG tablet Commonly known as: PROTONIX Take 1 tablet (40 mg total) by mouth daily.   rosuvastatin 5 MG tablet Commonly known as: Crestor Take 1 tablet (5 mg total) by mouth daily. What changed: additional instructions   Spiriva HandiHaler 18 MCG inhalation capsule Generic drug: tiotropium Place 1 capsule (18 mcg total) into inhaler and inhale daily.   triamcinolone cream 0.1 % Commonly known as: KENALOG         Objective:   BP 132/81    Pulse 76    Ht 5\' 10"  (1.778 m)    Wt 249 lb (112.9 kg)    SpO2 97%    BMI 35.73 kg/m   Wt Readings from Last 3 Encounters:  08/22/21 249  lb (112.9 kg)  08/08/21 250 lb (113.4 kg)  07/23/21 248 lb 12.8 oz (112.9 kg)    Physical Exam Vitals and nursing note reviewed.  Constitutional:      General: She is not in acute distress.    Appearance: She is well-developed. She is not diaphoretic.  Eyes:     Conjunctiva/sclera: Conjunctivae normal.  Cardiovascular:     Rate and Rhythm: Normal rate and regular rhythm.     Heart sounds: Normal heart sounds. No murmur heard. Pulmonary:     Effort: Pulmonary effort is normal. No respiratory distress.     Breath sounds: Normal breath sounds. No wheezing.  Musculoskeletal:        General: No tenderness. Normal range of motion.  Skin:    General: Skin is warm and dry.     Findings: No rash.  Neurological:     Mental Status: She is alert and oriented to person, place, and time.     Coordination: Coordination normal.  Psychiatric:        Behavior: Behavior normal.    A1c is 7.6 which is elevated, will discussed diet versus medication.  Assessment & Plan:   Problem List Items Addressed This Visit       Respiratory   COPD with chronic bronchitis (HCC)   Relevant Medications   methylPREDNISolone acetate (DEPO-MEDROL) injection 80 mg     Endocrine   Type 2 diabetes mellitus with other specified complication ([place]) - Primary   Relevant Orders   Bayer DCA Hb A1c Waived   Microalbumin / creatinine urine ratio     Other   Mixed hyperlipidemia    She had had steroids and she did get hip injections which likely played a factor in her A1c being elevated  Gave samples for , And will do steroid injection as well.  Follow up plan: Return in about 3 months (around 11/20/2021), or if symptoms worsen or fail to improve, for diabetes and COPD return.  Counseling provided for all of the vaccine components Orders Placed This Encounter  Procedures   Bayer [place] Hb A1c Waived   Microalbumin / creatinine urine ratio    Caryl Pina, MD [facility] 08/22/2021, 4:16 PM

## 2021-08-23 LAB — MICROALBUMIN / CREATININE URINE RATIO
Creatinine, Urine: 63.1 mg/dL
Microalb/Creat Ratio: <5 mg/g creat (ref 0–29)
Microalbumin, Urine: <3 ug/mL

## 2021-08-28 DIAGNOSIS — M79642 Pain in left hand: Secondary | ICD-10-CM | POA: Diagnosis not present

## 2021-08-28 DIAGNOSIS — M180 Bilateral primary osteoarthritis of first carpometacarpal joints: Secondary | ICD-10-CM | POA: Diagnosis not present

## 2021-08-28 DIAGNOSIS — M1811 Unilateral primary osteoarthritis of first carpometacarpal joint, right hand: Secondary | ICD-10-CM | POA: Diagnosis not present

## 2021-08-28 DIAGNOSIS — G7100 Muscular dystrophy, unspecified: Secondary | ICD-10-CM | POA: Diagnosis not present

## 2021-08-28 DIAGNOSIS — M79641 Pain in right hand: Secondary | ICD-10-CM | POA: Diagnosis not present

## 2021-08-28 DIAGNOSIS — M1812 Unilateral primary osteoarthritis of first carpometacarpal joint, left hand: Secondary | ICD-10-CM | POA: Diagnosis not present

## 2021-09-02 ENCOUNTER — Encounter (HOSPITAL_COMMUNITY): Payer: Self-pay

## 2021-09-02 ENCOUNTER — Ambulatory Visit (INDEPENDENT_AMBULATORY_CARE_PROVIDER_SITE_OTHER): Payer: Medicare HMO

## 2021-09-02 DIAGNOSIS — E538 Deficiency of other specified B group vitamins: Secondary | ICD-10-CM

## 2021-09-02 NOTE — Progress Notes (Signed)
Attempted to reach patient regarding LCS. Unable to reach patient despite multiple attempts. VM not available at this time. Referral closed due to multiple unsuccessful attempts to reach patient.

## 2021-09-02 NOTE — Progress Notes (Signed)
Cyanocobalamin injection given to left deltoid.  Patient tolerated well. 

## 2021-09-11 ENCOUNTER — Other Ambulatory Visit: Payer: Self-pay | Admitting: Family Medicine

## 2021-09-11 ENCOUNTER — Telehealth: Payer: Self-pay | Admitting: Family Medicine

## 2021-09-11 DIAGNOSIS — F411 Generalized anxiety disorder: Secondary | ICD-10-CM

## 2021-09-11 MED ORDER — BREZTRI AEROSPHERE 160-9-4.8 MCG/ACT IN AERO
2.0000 | INHALATION_SPRAY | Freq: Two times a day (BID) | RESPIRATORY_TRACT | 3 refills | Status: DC
Start: 2021-09-11 — End: 2021-11-27

## 2021-09-11 NOTE — Telephone Encounter (Signed)
Pt has been informed.

## 2021-09-11 NOTE — Telephone Encounter (Signed)
Pt was told to stop tiotropium (SPIRIVA HANDIHALER) 18 MCG inhalation capsule and budesonide-formoterol (SYMBICORT) 160-4.5 MCG/ACT inhaler and that Judithann Sauger was going to be called to [facility] but has not been sent.

## 2021-09-11 NOTE — Telephone Encounter (Signed)
[name],  Please review Breztri instructions.

## 2021-09-11 NOTE — Telephone Encounter (Signed)
I sent [name] corrected to the pharmacy

## 2021-09-20 DIAGNOSIS — M160 Bilateral primary osteoarthritis of hip: Secondary | ICD-10-CM | POA: Diagnosis not present

## 2021-09-30 ENCOUNTER — Other Ambulatory Visit: Payer: Self-pay | Admitting: Family Medicine

## 2021-10-03 ENCOUNTER — Ambulatory Visit (INDEPENDENT_AMBULATORY_CARE_PROVIDER_SITE_OTHER): Payer: Medicare HMO | Admitting: *Deleted

## 2021-10-03 DIAGNOSIS — E538 Deficiency of other specified B group vitamins: Secondary | ICD-10-CM | POA: Diagnosis not present

## 2021-10-03 NOTE — Progress Notes (Signed)
Pt given 1078mcg B12 Injection IM on the r-deltoid. Pt tol well

## 2021-10-04 DIAGNOSIS — M1811 Unilateral primary osteoarthritis of first carpometacarpal joint, right hand: Secondary | ICD-10-CM | POA: Diagnosis not present

## 2021-10-04 DIAGNOSIS — G8918 Other acute postprocedural pain: Secondary | ICD-10-CM | POA: Diagnosis not present

## 2021-10-04 DIAGNOSIS — M19031 Primary osteoarthritis, right wrist: Secondary | ICD-10-CM | POA: Diagnosis not present

## 2021-10-17 DIAGNOSIS — Z4789 Encounter for other orthopedic aftercare: Secondary | ICD-10-CM | POA: Diagnosis not present

## 2021-10-29 ENCOUNTER — Other Ambulatory Visit: Payer: Self-pay | Admitting: Family Medicine

## 2021-10-29 DIAGNOSIS — K219 Gastro-esophageal reflux disease without esophagitis: Secondary | ICD-10-CM

## 2021-10-31 DIAGNOSIS — Z4789 Encounter for other orthopedic aftercare: Secondary | ICD-10-CM | POA: Diagnosis not present

## 2021-10-31 DIAGNOSIS — M25641 Stiffness of right hand, not elsewhere classified: Secondary | ICD-10-CM | POA: Diagnosis not present

## 2021-11-05 ENCOUNTER — Ambulatory Visit (INDEPENDENT_AMBULATORY_CARE_PROVIDER_SITE_OTHER): Payer: Medicare HMO | Admitting: *Deleted

## 2021-11-05 DIAGNOSIS — E538 Deficiency of other specified B group vitamins: Secondary | ICD-10-CM

## 2021-11-05 DIAGNOSIS — M25641 Stiffness of right hand, not elsewhere classified: Secondary | ICD-10-CM | POA: Diagnosis not present

## 2021-11-12 DIAGNOSIS — M25641 Stiffness of right hand, not elsewhere classified: Secondary | ICD-10-CM | POA: Diagnosis not present

## 2021-11-14 ENCOUNTER — Telehealth: Payer: Self-pay

## 2021-11-14 DIAGNOSIS — L728 Other follicular cysts of the skin and subcutaneous tissue: Secondary | ICD-10-CM | POA: Diagnosis not present

## 2021-11-14 DIAGNOSIS — Z1283 Encounter for screening for malignant neoplasm of skin: Secondary | ICD-10-CM | POA: Diagnosis not present

## 2021-11-14 DIAGNOSIS — D485 Neoplasm of uncertain behavior of skin: Secondary | ICD-10-CM | POA: Diagnosis not present

## 2021-11-14 DIAGNOSIS — D225 Melanocytic nevi of trunk: Secondary | ICD-10-CM | POA: Diagnosis not present

## 2021-11-14 NOTE — Telephone Encounter (Addendum)
Pt needs a surgical clearance appt for EKG and may be additional labs. ? ?Pt is scheduled on 4/19 for regular check but not in a 51m ? ?Left pt message asking to return call. We can make another appt just for surgical clearance in a 30 min spot or cancel the 19th appt and do surg clearance and DM in the next available 30 minute spot. Can be on a same day. Next one looks like 5/4 ? ?Surgical clearance form at [facility] ?

## 2021-11-25 DIAGNOSIS — M25641 Stiffness of right hand, not elsewhere classified: Secondary | ICD-10-CM | POA: Diagnosis not present

## 2021-11-27 ENCOUNTER — Ambulatory Visit (INDEPENDENT_AMBULATORY_CARE_PROVIDER_SITE_OTHER): Payer: Medicare HMO | Admitting: Family Medicine

## 2021-11-27 ENCOUNTER — Encounter: Payer: Self-pay | Admitting: Family Medicine

## 2021-11-27 VITALS — BP 125/64 | HR 74 | Ht 70.0 in | Wt 254.0 lb

## 2021-11-27 DIAGNOSIS — E782 Mixed hyperlipidemia: Secondary | ICD-10-CM

## 2021-11-27 DIAGNOSIS — J449 Chronic obstructive pulmonary disease, unspecified: Secondary | ICD-10-CM | POA: Diagnosis not present

## 2021-11-27 DIAGNOSIS — E1169 Type 2 diabetes mellitus with other specified complication: Secondary | ICD-10-CM | POA: Diagnosis not present

## 2021-11-27 DIAGNOSIS — N8110 Cystocele, unspecified: Secondary | ICD-10-CM | POA: Diagnosis not present

## 2021-11-27 DIAGNOSIS — K219 Gastro-esophageal reflux disease without esophagitis: Secondary | ICD-10-CM | POA: Diagnosis not present

## 2021-11-27 LAB — BAYER DCA HB A1C WAIVED: HB A1C (BAYER DCA - WAIVED): 8.6 % — ABNORMAL HIGH (ref 4.8–5.6)

## 2021-11-27 MED ORDER — BUDESONIDE-FORMOTEROL FUMARATE 160-4.5 MCG/ACT IN AERO: 2.0000 | INHALATION_SPRAY | Freq: Two times a day (BID) | RESPIRATORY_TRACT | 3 refills | Status: DC

## 2021-11-27 MED ORDER — GABAPENTIN 600 MG PO TABS: ORAL_TABLET | ORAL | 1 refills | Status: DC

## 2021-11-27 MED ORDER — ROSUVASTATIN CALCIUM 5 MG PO TABS: 5.0000 mg | ORAL_TABLET | Freq: Every day | ORAL | 3 refills | Status: DC

## 2021-11-27 MED ORDER — SPIRIVA RESPIMAT 2.5 MCG/ACT IN AERS: 2.0000 | INHALATION_SPRAY | Freq: Every day | RESPIRATORY_TRACT | 3 refills | Status: DC

## 2021-11-27 MED ORDER — METFORMIN HCL 500 MG PO TABS: 500.0000 mg | ORAL_TABLET | Freq: Two times a day (BID) | ORAL | 3 refills | Status: DC

## 2021-11-27 MED ORDER — PANTOPRAZOLE SODIUM 40 MG PO TBEC: DELAYED_RELEASE_TABLET | ORAL | 3 refills | Status: DC

## 2021-11-27 NOTE — Progress Notes (Addendum)
? ?BP 125/64   Pulse 74   Ht '5\' 10"'$  (1.778 m)   Wt 254 lb (115.2 kg)   SpO2 99%   BMI 36.45 kg/m?   ? ?Subjective:  ? ?Patient ID: [name], female    DOB: Mar 10, 1956, 66 y.o.   MRN: [id number] ? ?HPI: ?[name] is a 66 y.o. female presenting on 11/27/2021 for Medical Management of Chronic Issues and Diabetes ? ? ?HPI ?Type 2 diabetes mellitus ?Patient comes in today for recheck of his diabetes. Patient has been currently taking no medicine, has been diet controlled. Patient is not currently on an ACE inhibitor/ARB. Patient has not seen an ophthalmologist this year. Patient denies any issues with their feet. The symptom started onset as an adult hyperlipidemia and neuropathy ARE RELATED TO DM  ? ?COPD ?Patient is coming in for COPD recheck today.  sHe is currently on Symbicort and Spiriva, she feels like they did a lot better than the [place] she did not like the [place].  sHe has a mild chronic cough but denies any major coughing spells or wheezing spells.  sHe has 1nighttime symptoms per week and 2daytime symptoms per week currently.  She was having a lot more wheezing with the other medicine that she is doing a lot better on Symbicort and Spiriva ? ?Hyperlipidemia ?Patient is coming in for recheck of his hyperlipidemia. The patient is currently taking Crestor every other day. They deny any issues with myalgias or history of liver damage from it. They deny any focal numbness or weakness or chest pain.  ? ?GERD ?Patient is currently on pantoprazole.  She denies any major symptoms or abdominal pain or belching or burping. She denies any blood in her stool or lightheadedness or dizziness.  ? ?Patient is coming in complaining of pelvic floor discomfort she feels like something is hanging low and wants to get it checked to see if she has prolapse.  She denies any major urinary symptoms except for she feels that pressure down there sometimes when she stands up.  She denies vaginal discharge or pain.   She denies any abdominal pain or fevers or chills or flank pain.  She denies urinary frequency or incontinence.  She says she mainly gets some pressure when she has a bowel movement in the vaginal region.  She does have a history of 3 vaginal deliveries and has 2 children.  She lost the 1. ? ?Relevant past medical, surgical, family and social history reviewed and updated as indicated. Interim medical history since our last visit reviewed. ?Allergies and medications reviewed and updated. ? ?Review of Systems  ?Constitutional:  Negative for chills and fever.  ?HENT:  Negative for congestion.   ?Eyes:  Negative for visual disturbance.  ?Respiratory:  Negative for cough, chest tightness, shortness of breath and wheezing.   ?Cardiovascular:  Negative for chest pain and leg swelling.  ?Musculoskeletal:  Negative for back pain and gait problem.  ?Skin:  Negative for rash.  ?Neurological:  Negative for dizziness, light-headedness and headaches.  ?Psychiatric/Behavioral:  Negative for agitation and behavioral problems.   ?All other systems reviewed and are negative. ? ?Per HPI unless specifically indicated above ? ? ?Allergies as of 11/27/2021   ? ?   Reactions  ? Fenofibrate Other (See Comments)  ? Flu-like   ? Penicillin G Other (See Comments)  ? Blisters in mouth  ? Pravastatin Other (See Comments)  ? "flu like symptoms"   ? Statins   ? ?  ? ?  ?  Medication List  ?  ? ?  ? Accurate as of November 27, 2021 12:20 PM. If you have any questions, ask your nurse or doctor.  ?  ?  ? ?  ? ?STOP taking these medications   ? ?Breztri Aerosphere 160-9-4.8 MCG/ACT Aero ?Generic drug: Budeson-Glycopyrrol-Formoterol ?Stopped by: Worthy Rancher, MD ?  ? ?  ? ?TAKE these medications   ? ?albuterol 108 (90 Base) MCG/ACT inhaler ?Commonly known as: Ventolin HFA ?Inhale 2 puffs into the lungs every 6 (six) hours as needed. For shortness of breath ?  ?albuterol (2.5 MG/3ML) 0.083% nebulizer solution ?Commonly known as: PROVENTIL ?Take 3 mLs  (2.5 mg total) by nebulization every 6 (six) hours as needed for wheezing or shortness of breath. ?  ?budesonide-formoterol 160-4.5 MCG/ACT inhaler ?Commonly known as: SYMBICORT ?Inhale 2 puffs into the lungs 2 (two) times daily. ?Started by: Worthy Rancher, MD ?  ?citalopram 40 MG tablet ?Commonly known as: CELEXA ?TAKE ONE (1) TABLET EACH DAY ?  ?gabapentin 600 MG tablet ?Commonly known as: NEURONTIN ?TAKE ONE (1) TABLET THREE (3) TIMES EACH DAY ?  ?Melatonin 10 MG Tabs ?Take 1 tablet by mouth at bedtime. ?  ?metFORMIN 500 MG tablet ?Commonly known as: GLUCOPHAGE ?Take 1 tablet (500 mg total) by mouth 2 (two) times daily with a meal. ?Started by: Worthy Rancher, MD ?  ?pantoprazole 40 MG tablet ?Commonly known as: PROTONIX ?TAKE ONE (1) TABLET EACH DAY ?  ?rosuvastatin 5 MG tablet ?Commonly known as: Crestor ?Take 1 tablet (5 mg total) by mouth daily. Takes one every other day ?  ?Spiriva Respimat 2.5 MCG/ACT Aers ?Generic drug: Tiotropium Bromide Monohydrate ?Inhale 2 puffs into the lungs daily. ?Started by: Worthy Rancher, MD ?  ?triamcinolone cream 0.1 % ?Commonly known as: KENALOG ?  ? ?  ? ? ? ?Objective:  ? ?BP 125/64   Pulse 74   Ht '5\' 10"'$  (1.778 m)   Wt 254 lb (115.2 kg)   SpO2 99%   BMI 36.45 kg/m?   ?Wt Readings from Last 3 Encounters:  ?11/27/21 254 lb (115.2 kg)  ?08/22/21 249 lb (112.9 kg)  ?08/08/21 250 lb (113.4 kg)  ?  ?Physical Exam ?Vitals and nursing note reviewed. Exam conducted with a chaperone present.  ?Constitutional:   ?   General: She is not in acute distress. ?   Appearance: She is well-developed. She is not diaphoretic.  ?Eyes:  ?   Conjunctiva/sclera: Conjunctivae normal.  ?Cardiovascular:  ?   Rate and Rhythm: Normal rate and regular rhythm.  ?   Heart sounds: Normal heart sounds. No murmur heard. ?Pulmonary:  ?   Effort: Pulmonary effort is normal. No respiratory distress.  ?   Breath sounds: Normal breath sounds. No wheezing.  ?Genitourinary: ?   Exam position:  Lithotomy position.  ?   Comments: Vagina and uterus are absent, anterior bladder prolapse is present ?Musculoskeletal:     ?   General: No tenderness. Normal range of motion.  ?Skin: ?   General: Skin is warm and dry.  ?   Findings: No rash.  ?Neurological:  ?   Mental Status: She is alert and oriented to person, place, and time.  ?   Coordination: Coordination normal.  ?Psychiatric:     ?   Behavior: Behavior normal.  ? ? ? ? ?Assessment & Plan:  ? ?Problem List Items Addressed This Visit   ? ?  ? Respiratory  ? COPD  GOLD II/ AB  ?  Relevant Medications  ? Tiotropium Bromide Monohydrate (SPIRIVA RESPIMAT) 2.5 MCG/ACT AERS  ? budesonide-formoterol (SYMBICORT) 160-4.5 MCG/ACT inhaler  ?  ? Digestive  ? Gastroesophageal reflux disease without esophagitis  ? Relevant Medications  ? pantoprazole (PROTONIX) 40 MG tablet  ?  ? Endocrine  ? Type 2 diabetes mellitus with other specified complication ([place]) - Primary  ? Relevant Medications  ? gabapentin (NEURONTIN) 600 MG tablet  ? rosuvastatin (CRESTOR) 5 MG tablet  ? metFORMIN (GLUCOPHAGE) 500 MG tablet  ? Other Relevant Orders  ? Lipid panel  ? Bayer DCA Hb A1c Waived  ?  ? Other  ? Mixed hyperlipidemia  ? Relevant Medications  ? rosuvastatin (CRESTOR) 5 MG tablet  ? Other Relevant Orders  ? Lipid panel  ? Bayer DCA Hb A1c Waived  ? ?Other Visit Diagnoses   ? ? Bladder prolapse, female, acquired      ? ?  ?  ?Discussed options for bladder prolapse and patient wants to hold off anything right now but just wanted to make sure that that is what was causing the pressure. ? ?Blood pressure looks good today and, no change in medicine. ? ?Patient feels like she does a lot better with Symbicort and Spiriva instead of the Breztri so we will continue forward with those ? ?A1c is 8.6, patient says she cannot take metformin, will start Jardiance ?Follow up plan: ?Return in about 3 months (around 02/26/2022), or if symptoms worsen or fail to improve, for Diabetes  recheck. ? ?Counseling provided for all of the vaccine components ?Orders Placed This Encounter  ?Procedures  ? Lipid panel  ? Bayer DCA Hb A1c Waived  ? ? ?Caryl Pina, MD ?[place] ?11/27/2021, 12:20 PM ? ?

## 2021-11-27 NOTE — Addendum Note (Signed)
Addended by: Antonietta Barcelona D on: 11/27/2021 12:27 PM ? ? Modules accepted: Orders ? ?

## 2021-11-28 DIAGNOSIS — Z4789 Encounter for other orthopedic aftercare: Secondary | ICD-10-CM | POA: Diagnosis not present

## 2021-11-28 LAB — LIPID PANEL
Chol/HDL Ratio: 5.5 ratio — ABNORMAL HIGH (ref 0.0–4.4)
Cholesterol, Total: 236 mg/dL — ABNORMAL HIGH (ref 100–199)
HDL: 43 mg/dL (ref >39)
LDL Chol Calc (NIH): 164 mg/dL — ABNORMAL HIGH (ref 0–99)
Triglycerides: 158 mg/dL — ABNORMAL HIGH (ref 0–149)
VLDL Cholesterol Cal: 29 mg/dL (ref 5–40)

## 2021-11-29 ENCOUNTER — Telehealth: Payer: Self-pay | Admitting: Family Medicine

## 2021-11-29 ENCOUNTER — Other Ambulatory Visit: Payer: Self-pay | Admitting: Family Medicine

## 2021-11-29 DIAGNOSIS — E1169 Type 2 diabetes mellitus with other specified complication: Secondary | ICD-10-CM

## 2021-11-29 MED ORDER — EMPAGLIFLOZIN 25 MG PO TABS: 25.0000 mg | ORAL_TABLET | Freq: Every day | ORAL | 3 refills | Status: DC

## 2021-11-29 NOTE — Progress Notes (Signed)
Sent Jardiance to the pharmacy for her ?

## 2021-11-29 NOTE — Telephone Encounter (Signed)
Patient came in 4/19 and said Dr [name] wanted her to start taking Jardiance but it has not been called in. Please send to [place], [place] (Ph: [phone]) ?

## 2021-11-29 NOTE — Telephone Encounter (Signed)
[name], ? ?What are  your instructions for Jardiance? ?

## 2021-11-29 NOTE — Progress Notes (Signed)
Patient aware and verbalized understanding. °

## 2021-12-02 ENCOUNTER — Other Ambulatory Visit: Payer: Self-pay | Admitting: Family Medicine

## 2021-12-02 DIAGNOSIS — E782 Mixed hyperlipidemia: Secondary | ICD-10-CM

## 2021-12-02 MED ORDER — EZETIMIBE 10 MG PO TABS: 10.0000 mg | ORAL_TABLET | Freq: Every day | ORAL | 3 refills | Status: DC

## 2021-12-06 ENCOUNTER — Ambulatory Visit (INDEPENDENT_AMBULATORY_CARE_PROVIDER_SITE_OTHER): Payer: Medicare HMO

## 2021-12-06 DIAGNOSIS — E538 Deficiency of other specified B group vitamins: Secondary | ICD-10-CM | POA: Diagnosis not present

## 2021-12-06 NOTE — Progress Notes (Signed)
B12 given in right deltoid without difficulty. Next appt scheduled for 5/30 at 9:30. Pt made aware ?

## 2021-12-10 DIAGNOSIS — M25641 Stiffness of right hand, not elsewhere classified: Secondary | ICD-10-CM | POA: Diagnosis not present

## 2021-12-17 DIAGNOSIS — M25641 Stiffness of right hand, not elsewhere classified: Secondary | ICD-10-CM | POA: Diagnosis not present

## 2021-12-19 DIAGNOSIS — L988 Other specified disorders of the skin and subcutaneous tissue: Secondary | ICD-10-CM | POA: Diagnosis not present

## 2021-12-19 DIAGNOSIS — D485 Neoplasm of uncertain behavior of skin: Secondary | ICD-10-CM | POA: Diagnosis not present

## 2022-01-07 ENCOUNTER — Ambulatory Visit (INDEPENDENT_AMBULATORY_CARE_PROVIDER_SITE_OTHER): Payer: Medicare HMO

## 2022-01-07 DIAGNOSIS — E538 Deficiency of other specified B group vitamins: Secondary | ICD-10-CM

## 2022-01-07 NOTE — Progress Notes (Signed)
Patient came in for b12 injection - given in lt arm - pt tolerated well

## 2022-01-09 DIAGNOSIS — M13842 Other specified arthritis, left hand: Secondary | ICD-10-CM | POA: Diagnosis not present

## 2022-01-09 DIAGNOSIS — Z4789 Encounter for other orthopedic aftercare: Secondary | ICD-10-CM | POA: Diagnosis not present

## 2022-02-05 DIAGNOSIS — M1811 Unilateral primary osteoarthritis of first carpometacarpal joint, right hand: Secondary | ICD-10-CM | POA: Diagnosis not present

## 2022-02-05 DIAGNOSIS — M1812 Unilateral primary osteoarthritis of first carpometacarpal joint, left hand: Secondary | ICD-10-CM | POA: Diagnosis not present

## 2022-02-05 DIAGNOSIS — M180 Bilateral primary osteoarthritis of first carpometacarpal joints: Secondary | ICD-10-CM | POA: Diagnosis not present

## 2022-02-06 ENCOUNTER — Ambulatory Visit (INDEPENDENT_AMBULATORY_CARE_PROVIDER_SITE_OTHER): Payer: Medicare HMO

## 2022-02-06 DIAGNOSIS — D518 Other vitamin B12 deficiency anemias: Secondary | ICD-10-CM | POA: Diagnosis not present

## 2022-02-06 DIAGNOSIS — E538 Deficiency of other specified B group vitamins: Secondary | ICD-10-CM

## 2022-02-06 NOTE — Progress Notes (Signed)
Cyanocobalamin injection given to right deltoid, patient tolerated well.

## 2022-02-17 ENCOUNTER — Ambulatory Visit: Payer: Self-pay | Admitting: *Deleted

## 2022-02-17 NOTE — Chronic Care Management (AMB) (Signed)
  Chronic Care Management   Note  02/17/2022 Name: [name] MRN: [id number] DOB: 1955/10/15  Patient has not recently engaged with the Chronic Care Management RN Care Manager. Removing RN Care Manager from Care Team and closing [place]. If patient is currently engaged with another CCM team member I will forward this encounter to inform them of my case closure. Patient may be eligible for re-engagement with RN Care Manager in the future if necessary and can discuss this with their PCP.  Chong Sicilian, BSN, RN-BC Embedded Chronic Care Manager [facility] Direct Dial: [phone]

## 2022-02-21 NOTE — Patient Instructions (Signed)
Our records indicate that you are due for your annual mammogram/breast imaging. While there is no way to prevent breast cancer, early detection provides the best opportunity for curing it. For women over the age of 40, the American Cancer Society recommends a yearly clinical breast exam and a yearly mammogram. These practices have saved thousands of lives. We need your help to ensure that you are receiving optimal medical care. Please call the imaging location that has done you previous mammograms. Please remember to list us as your primary care. This helps make sure we receive a report and can update your chart.  Below is the contact information for several local breast imaging centers. You may call the location that works best for you, and they will be happy to assistance in making you an appointment. You do not need an order for a regular screening mammogram. However, if you are having any problems or concerns with you breast area, please let your primary care provider know, and appropriate orders will be placed. Please let our office know if you have any questions or concerns. Or if you need information for another imaging center not on this list or outside of the area. We are commented to working with you on your health care journey.   The mobile unit/bus ([facility]) - they come twice a month to our location.  These appointments can be made through our office or by call The Breast Center  [facility]  [address] [place], [place] [address] Phone [phone]   Hospital Radiology Department  [address]  [place], [place] [address] [phone]  [facility]  [address] [place], [place] [address] [phone]  [facility] [place]  [address] [place] [address] [phone]  [facility]  [address], [address] [place] [address] [phone]  [facility] [place]  [address] [place], [place] [address] [phone]  [facility] [address] [place], [place] [address] [phone]  [facility] [address]  [address] [place], [place] [address] [phone]  [facility] [address] [place] [address] [phone]     

## 2022-02-26 ENCOUNTER — Encounter: Payer: Self-pay | Admitting: Family Medicine

## 2022-02-26 ENCOUNTER — Ambulatory Visit (INDEPENDENT_AMBULATORY_CARE_PROVIDER_SITE_OTHER): Payer: Medicare HMO | Admitting: Family Medicine

## 2022-02-26 VITALS — BP 108/58 | HR 86 | Temp 98.0°F | Ht 70.0 in | Wt 241.0 lb

## 2022-02-26 DIAGNOSIS — F411 Generalized anxiety disorder: Secondary | ICD-10-CM

## 2022-02-26 DIAGNOSIS — Z01818 Encounter for other preprocedural examination: Secondary | ICD-10-CM

## 2022-02-26 DIAGNOSIS — J449 Chronic obstructive pulmonary disease, unspecified: Secondary | ICD-10-CM

## 2022-02-26 DIAGNOSIS — E782 Mixed hyperlipidemia: Secondary | ICD-10-CM | POA: Diagnosis not present

## 2022-02-26 DIAGNOSIS — E1169 Type 2 diabetes mellitus with other specified complication: Secondary | ICD-10-CM | POA: Diagnosis not present

## 2022-02-26 LAB — BAYER DCA HB A1C WAIVED: HB A1C (BAYER DCA - WAIVED): 7.9 % — ABNORMAL HIGH (ref 4.8–5.6)

## 2022-02-26 MED ORDER — CITALOPRAM HYDROBROMIDE 40 MG PO TABS: ORAL_TABLET | ORAL | 3 refills | Status: DC

## 2022-02-26 MED ORDER — SEMAGLUTIDE(0.25 OR 0.5MG/DOS) 2 MG/3ML ~~LOC~~ SOPN: 0.5000 mg | PEN_INJECTOR | SUBCUTANEOUS | 3 refills | Status: DC

## 2022-02-26 MED ORDER — GABAPENTIN 600 MG PO TABS: ORAL_TABLET | ORAL | 3 refills | Status: DC

## 2022-02-26 NOTE — Addendum Note (Signed)
Addended by: Caryl Pina on: 02/26/2022 02:40 PM   Modules accepted: Orders

## 2022-02-26 NOTE — Progress Notes (Addendum)
BP (!) 108/58   Pulse 86   Temp 98 F (36.7 C)   Ht _0  (1.778 m)   Wt 241 lb (109.3 kg)   SpO2 95%   BMI 34.58 kg/m    Subjective:   Patient ID: [name], female    DOB: 07-07-1956, 66 y.o.   MRN: [id number]  HPI: [name] is a 66 y.o. female presenting on 02/26/2022 for Medical Management of Chronic Issues and Diabetes   HPI Preoperative physical Patient is coming in today for preoperative physical exam.  She is going to get right total hip arthroplasty by Dr. Lucienne Capers in September  Type 2 diabetes mellitus Patient comes in today for recheck of his diabetes. Patient has been currently taking Jardiance and metformin. Patient is not currently on an ACE inhibitor/ARB. Patient has seen an ophthalmologist this year. Patient denies any new issues with their feet. The symptom started onset as an adult hyperlipidemia with neuropathy ARE RELATED TO DM   COPD Patient is coming in for COPD recheck today.  sHe is currently on albuterol and Symbicort and Spiriva.  sHe has a mild chronic cough but denies any major coughing spells or wheezing spells.  sHe has 1nighttime symptoms per week and 2daytime symptoms per week currently.  She says she has been stable and she gets short of breath walking long distances like more than 2 blocks but short distances and up stairs she is fine  Hyperlipidemia Patient is coming in for recheck of his hyperlipidemia. The patient is currently taking Zetia and Crestor. They deny any issues with myalgias or history of liver damage from it. They deny any focal numbness or weakness or chest pain.   Anxiety recheck Patient currently takes citalopram for anxiety and depression.  She feels like is doing well.    02/26/2022    2:05 PM 11/27/2021   11:20 AM 08/22/2021    3:39 PM 08/22/2021    3:36 PM 07/23/2021    9:42 AM  Depression screen PHQ 2/9  Decreased Interest _1 0 0  Down, Depressed, Hopeless 1 0 2 0 0  PHQ - 2 Score _2 0 0  Altered  sleeping _3 0 0  Tired, decreased energy _4 0 0  Change in appetite _5 0 0  Feeling bad or failure about yourself  1 0 3 0 0  Trouble concentrating 1 0 1 0 0  Moving slowly or fidgety/restless 0 0  0 0  Suicidal thoughts 0 0  0 0  PHQ-9 Score _6 0 0  Difficult doing work/chores Not difficult at all    Not difficult at all     Relevant past medical, surgical, family and social history reviewed and updated as indicated. Interim medical history since our last visit reviewed. Allergies and medications reviewed and updated.  Review of Systems  Constitutional:  Negative for chills and fever.  Eyes:  Negative for visual disturbance.  Respiratory:  Negative for chest tightness and shortness of breath.   Cardiovascular:  Negative for chest pain and leg swelling.  Musculoskeletal:  Negative for back pain and gait problem.  Skin:  Negative for rash.  Neurological:  Negative for dizziness, light-headedness and headaches.  Psychiatric/Behavioral:  Negative for agitation and behavioral problems.   All other systems reviewed and are negative.   Per HPI unless specifically indicated above   Allergies as of 02/26/2022  Reactions   Fenofibrate Other (See Comments)   Flu-like    Penicillin G Other (See Comments)   Blisters in mouth   Pravastatin Other (See Comments)   "flu like symptoms"    Statins         Medication List        Accurate as of February 26, 2022  2:40 PM. If you have any questions, ask your nurse or doctor.          albuterol 108 (90 Base) MCG/ACT inhaler Commonly known as: Ventolin HFA Inhale 2 puffs into the lungs every 6 (six) hours as needed. For shortness of breath   albuterol (2.5 MG/3ML) 0.083% nebulizer solution Commonly known as: PROVENTIL Take 3 mLs (2.5 mg total) by nebulization every 6 (six) hours as needed for wheezing or shortness of breath.   budesonide-formoterol 160-4.5 MCG/ACT inhaler Commonly known as: SYMBICORT Inhale 2  puffs into the lungs 2 (two) times daily.   citalopram 40 MG tablet Commonly known as: CELEXA TAKE ONE (1) TABLET EACH DAY   empagliflozin 25 MG Tabs tablet Commonly known as: Jardiance Take 1 tablet (25 mg total) by mouth daily before breakfast.   ezetimibe 10 MG tablet Commonly known as: Zetia Take 1 tablet (10 mg total) by mouth daily.   gabapentin 600 MG tablet Commonly known as: NEURONTIN TAKE ONE (1) TABLET THREE (3) TIMES EACH DAY   Melatonin 10 MG Tabs Take 1 tablet by mouth at bedtime.   metFORMIN 500 MG tablet Commonly known as: GLUCOPHAGE Take 1 tablet (500 mg total) by mouth 2 (two) times daily with a meal.   pantoprazole 40 MG tablet Commonly known as: PROTONIX TAKE ONE (1) TABLET EACH DAY   rosuvastatin 5 MG tablet Commonly known as: Crestor Take 1 tablet (5 mg total) by mouth daily. Takes one every other day   Semaglutide(0.25 or 0.5MG/DOS) 2 MG/3ML Sopn Inject 0.5 mg into the skin once a week. Started by: [name] Manuelito Poage, MD   Spiriva Respimat 2.5 MCG/ACT Aers Generic drug: Tiotropium Bromide Monohydrate Inhale 2 puffs into the lungs daily.   triamcinolone cream 0.1 % Commonly known as: KENALOG         Objective:   BP (!) 108/58   Pulse 86   Temp 98 F (36.7 C)   Ht _0  (1.778 m)   Wt 241 lb (109.3 kg)   SpO2 95%   BMI 34.58 kg/m   Wt Readings from Last 3 Encounters:  02/26/22 241 lb (109.3 kg)  11/27/21 254 lb (115.2 kg)  08/22/21 249 lb (112.9 kg)    Physical Exam Vitals and nursing note reviewed.  Constitutional:      General: She is not in acute distress.    Appearance: She is well-developed. She is not diaphoretic.  Eyes:     Conjunctiva/sclera: Conjunctivae normal.  Cardiovascular:     Rate and Rhythm: Normal rate and regular rhythm.     Heart sounds: Normal heart sounds. No murmur heard. Pulmonary:     Effort: Pulmonary effort is normal. No respiratory distress.     Breath sounds: Normal breath sounds. No  wheezing.  Musculoskeletal:        General: Swelling (1+ pitting edema bilateral lower extremity) present. No tenderness. Normal range of motion.  Skin:    General: Skin is warm and dry.     Findings: No rash.  Neurological:     Mental Status: She is alert and oriented to person, place, and time.  Coordination: Coordination normal.  Psychiatric:        Behavior: Behavior normal.     EKG: NSR and HR 80  Assessment & Plan:   Problem List Items Addressed This Visit       Respiratory   COPD with chronic bronchitis ([place])     Endocrine   Type 2 diabetes mellitus with other specified complication (HCC)   Relevant Medications   gabapentin (NEURONTIN) 600 MG tablet   Semaglutide,0.25 or 0.5MG/DOS, 2 MG/3ML SOPN   Other Relevant Orders   CBC with Differential/Platelet   CMP14+EGFR   Lipid panel   Bayer DCA Hb A1c Waived   AMB Referral to [place]     Other   Mixed hyperlipidemia   Relevant Orders   CBC with Differential/Platelet   CMP14+EGFR   Lipid panel   Bayer DCA Hb A1c Waived   Generalized anxiety disorder   Relevant Medications   citalopram (CELEXA) 40 MG tablet   Other Visit Diagnoses     Preoperative clearance    -  Primary   Relevant Orders   EKG 12-Lead (Completed)     A1c 7.9 today, according to the preoperative report they want her less than 7.5.  Her BMI is less than 40.  The rest of her blood work is pending.  We will start her on Ozempic, gave a sample for 0.25 that she can do for the first 2 weeks and then increase to 0.5 mg after that and the referral to pharmacology  Follow up plan: Return if symptoms worsen or fail to improve, for 2 to 63-monthdiabetes recheck.  Counseling provided for all of the vaccine components Orders Placed This Encounter  Procedures   CBC with Differential/Platelet   CMP14+EGFR   Lipid panel   Bayer DCA Hb A1c Waived   AMB Ref[facility] 02/26/2022, 2:40 PM

## 2022-02-27 ENCOUNTER — Telehealth: Payer: Self-pay

## 2022-02-27 LAB — CMP14+EGFR
ALT: 7 IU/L (ref 0–32)
AST: 10 IU/L (ref 0–40)
Albumin/Globulin Ratio: 1.8 (ref 1.2–2.2)
Albumin: 4 g/dL (ref 3.9–4.9)
Alkaline Phosphatase: 92 IU/L (ref 44–121)
BUN/Creatinine Ratio: 12 (ref 12–28)
BUN: 11 mg/dL (ref 8–27)
Bilirubin Total: 0.3 mg/dL (ref 0.0–1.2)
CO2: 23 mmol/L (ref 20–29)
Calcium: 9.1 mg/dL (ref 8.7–10.3)
Chloride: 101 mmol/L (ref 96–106)
Creatinine, Ser: 0.94 mg/dL (ref 0.57–1.00)
Globulin, Total: 2.2 g/dL (ref 1.5–4.5)
Glucose: 200 mg/dL — ABNORMAL HIGH (ref 70–99)
Potassium: 4 mmol/L (ref 3.5–5.2)
Sodium: 139 mmol/L (ref 134–144)
Total Protein: 6.2 g/dL (ref 6.0–8.5)
eGFR: 68 mL/min/{1.73_m2} (ref >59)

## 2022-02-27 LAB — CBC WITH DIFFERENTIAL/PLATELET
Basophils Absolute: 0.1 10*3/uL (ref 0.0–0.2)
Basos: 1 %
EOS (ABSOLUTE): 0.1 10*3/uL (ref 0.0–0.4)
Eos: 1 %
Hematocrit: 43.7 % (ref 34.0–46.6)
Hemoglobin: 14.1 g/dL (ref 11.1–15.9)
Immature Grans (Abs): 0 10*3/uL (ref 0.0–0.1)
Immature Granulocytes: 0 %
Lymphocytes Absolute: 2.8 10*3/uL (ref 0.7–3.1)
Lymphs: 26 %
MCH: 27.8 pg (ref 26.6–33.0)
MCHC: 32.3 g/dL (ref 31.5–35.7)
MCV: 86 fL (ref 79–97)
Monocytes Absolute: 0.5 10*3/uL (ref 0.1–0.9)
Monocytes: 4 %
Neutrophils Absolute: 7.3 10*3/uL — ABNORMAL HIGH (ref 1.4–7.0)
Neutrophils: 68 %
Platelets: 297 10*3/uL (ref 150–450)
RBC: 5.08 x10E6/uL (ref 3.77–5.28)
RDW: 12.3 % (ref 11.7–15.4)
WBC: 10.8 10*3/uL (ref 3.4–10.8)

## 2022-02-27 LAB — LIPID PANEL
Chol/HDL Ratio: 3.6 ratio (ref 0.0–4.4)
Cholesterol, Total: 152 mg/dL (ref 100–199)
HDL: 42 mg/dL (ref >39)
LDL Chol Calc (NIH): 73 mg/dL (ref 0–99)
Triglycerides: 222 mg/dL — ABNORMAL HIGH (ref 0–149)
VLDL Cholesterol Cal: 37 mg/dL (ref 5–40)

## 2022-02-27 NOTE — Chronic Care Management (AMB) (Signed)
  Chronic Care Management   Note  02/27/2022 Name: SASCHA PALMA MRN: [id number] DOB: 1955/12/17  AVAMARIE CROSSLEY is a 66 y.o. year old female who is a primary care patient of [name], [name], MD. I reached out to [name] by phone today in response to a referral sent by Ms. York Ram Sunderlin's PCP.  Ms. Dart was given information about Chronic Care Management services today including:  CCM service includes personalized support from designated clinical staff supervised by her physician, including individualized plan of care and coordination with other care providers 24/7 contact phone numbers for assistance for urgent and routine care needs. Service will only be billed when office clinical staff spend 20 minutes or more in a month to coordinate care. Only one practitioner may furnish and bill the service in a calendar month. The patient may stop CCM services at any time (effective at the end of the month) by phone call to the office staff. The patient is responsible for co-pay (up to 20% after annual deductible is met) if co-pay is required by the individual health plan.   Patient agreed to services and verbal consent obtained.   Follow up plan: Telephone appointment with care management team member scheduled for:04/01/2022  Noreene Larsson, [place], [place] [address] Direct Dial: [phone] [email or web address]

## 2022-03-07 ENCOUNTER — Ambulatory Visit (INDEPENDENT_AMBULATORY_CARE_PROVIDER_SITE_OTHER): Payer: Medicare HMO

## 2022-03-07 DIAGNOSIS — E538 Deficiency of other specified B group vitamins: Secondary | ICD-10-CM

## 2022-03-07 NOTE — Progress Notes (Signed)
Cyanocobalamin injection given to left deltoid.  Patient tolerated well. 

## 2022-04-01 ENCOUNTER — Ambulatory Visit (INDEPENDENT_AMBULATORY_CARE_PROVIDER_SITE_OTHER): Payer: Medicare HMO | Admitting: Pharmacist

## 2022-04-01 DIAGNOSIS — J449 Chronic obstructive pulmonary disease, unspecified: Secondary | ICD-10-CM

## 2022-04-01 DIAGNOSIS — E1169 Type 2 diabetes mellitus with other specified complication: Secondary | ICD-10-CM

## 2022-04-01 NOTE — Progress Notes (Signed)
Chronic Care Management Pharmacy Note  04/01/2022 Name:  [name] MRN:  [id number] DOB:  09/26/1955  Summary:  Diabetes: New goal. Uncontrolled A1C 7.9%, GFR 68; current treatment:OZEMPIC 0.5MG WEEKLY, JARDIANCE 25MG DAILY, METFORMIN 500MG TWICE DAILY;  Current exercise: ENCOURAGED Educated on [place] TITRATION--INCREASE TO 0.5MG WEEKLY; WE CAN CONTINUE TO INCREASE UNTIL 2MG WEEKLY; PATIENT EXPERIENCING A LITTLE NAUSEA; GOING SLOW Recommended OZEMPIC, JARDIANCE Assessed patient finances. ENROLLED IN NOVO [place]; WILL SUBMIT APPLICATION FOR JARDIANCE TODAY Chronic Obstructive Pulmonary Disease:  New goal. controlled; current treatment: USES ONLY SYMBICORT/rescue albuterol; does not take spiriva although patient could benefit from triple therapy NO patient assistance available Recommended breztri, but patient states she was worse on breztri x 3 months  Most recent Pulmonary Function Testing:  0 exacerbations requiring treatment in the last 6 months  Assessed patient finances. No assistance available for symbicort; patient to continue purchasing symbicort   Patient Goals/Self-Care Activities patient will:  - take medications as prescribed as evidenced by patient report and record review check glucose DAILY (FASTING) OR IF SYMPTPOMATIC, document, and provide at future appointments collaborate with provider on medication access solutions target a minimum of 150 minutes of moderate intensity exercise weekly engage in dietary modifications by FOLLOWING A HEART HEALTHY DIET/HEALTHY PLATE METHOD   Subjective: [name] is an 66 y.o. year old female who is a primary patient of [name], [name], MD.  The CCM team was consulted for assistance with disease management and care coordination needs.    Engaged with patient by telephone for initial visit in response to provider referral for pharmacy case management and/or care coordination services.   Consent to  Services:  The patient was given the following information about Chronic Care Management services today, agreed to services, and gave verbal consent: 1. CCM service includes personalized support from designated clinical staff supervised by the primary care provider, including individualized plan of care and coordination with other care providers 2. 24/7 contact phone numbers for assistance for urgent and routine care needs. 3. Service will only be billed when office clinical staff spend 20 minutes or more in a month to coordinate care. 4. Only one practitioner may furnish and bill the service in a calendar month. 5.The patient may stop CCM services at any time (effective at the end of the month) by phone call to the office staff. 6. The patient will be responsible for cost sharing (co-pay) of up to 20% of the service fee (after annual deductible is met). Patient agreed to services and consent obtained.  Patient Care Team: [name], [name], MD as PCP - General (Family Medicine) Satira Sark, MD as PCP - Cardiology (Cardiology) Rogene Houston, MD as Consulting Physician (Gastroenterology) Weber Cooks, MD as Referring Physician (Orthopedic Surgery) Lavera Guise, [facility] as Pharmacist (Family Medicine)  Objective:  Lab Results  Component Value Date   CREATININE 0.94 02/26/2022   CREATININE 0.85 08/08/2021   CREATININE 0.84 03/26/2021    Lab Results  Component Value Date   HGBA1C 7.9 (H) 02/26/2022   Last diabetic Eye exam:  Lab Results  Component Value Date/Time   HMDIABEYEEXA No Retinopathy 12/15/2017 12:00 AM    Last diabetic Foot exam: No results found for: "HMDIABFOOTEX"      Component Value Date/Time   CHOL 152 02/26/2022 1344   CHOL 219 (H) 01/10/2013 0900   TRIG 222 (H) 02/26/2022 1344   TRIG 121 01/10/2015 0955   TRIG 134 01/10/2013 0900  HDL 42 02/26/2022 1344   HDL 43 01/10/2015 0955   HDL 37 (L) 01/10/2013 0900   CHOLHDL 3.6 02/26/2022 1344   CHOLHDL 6.4  12/10/2010 0608   VLDL 45 (H) 12/10/2010 0608   LDLCALC 73 02/26/2022 1344   LDLCALC 148 (H) 03/20/2014 0826   LDLCALC 155 (H) 01/10/2013 0900       Latest Ref Rng & Units 02/26/2022    1:44 PM 08/08/2021   11:40 AM 03/26/2021    3:59 PM  Hepatic Function  Total Protein 6.0 - 8.5 g/dL 6.2  5.7  6.1   Albumin 3.9 - 4.9 g/dL 4.0   3.9   AST 0 - 40 IU/L _0 ALT 0 - 32 IU/L _1 Alk Phosphatase 44 - 121 IU/L 92   82   Total Bilirubin 0.0 - 1.2 mg/dL 0.3  0.5  0.2     Lab Results  Component Value Date/Time   TSH 3.34 08/08/2021 11:40 AM   TSH 2.970 02/06/2020 11:38 AM       Latest Ref Rng & Units 02/26/2022    1:44 PM 08/08/2021   11:40 AM 03/26/2021    3:59 PM  CBC  WBC 3.4 - 10.8 x10E3/uL 10.8  10.2  11.3   Hemoglobin 11.1 - 15.9 g/dL 14.1  13.4  13.4   Hematocrit 34.0 - 46.6 % 43.7  41.3  40.3   Platelets 150 - 450 x10E3/uL 297  265  278     Lab Results  Component Value Date/Time   VD25OH 22.0 (L) 03/03/2016 11:26 AM    Clinical ASCVD: No  The 10-year ASCVD risk score (Arnett DK, et al., 2019) is: 11.5%   Values used to calculate the score:     Age: 4 years     Sex: Female     Is Non-Hispanic African American: No     Diabetic: Yes     Tobacco smoker: Yes     Systolic Blood Pressure: 932 mmHg     Is BP treated: No     HDL Cholesterol: 42 mg/dL     Total Cholesterol: 152 mg/dL    Other: (CHADS2VASc if Afib, PHQ9 if depression, MMRC or CAT for COPD, ACT, DEXA)  Social History   Tobacco Use  Smoking Status Every Day   Packs/day: 0.50   Years: 40.00   Total pack years: 20.00   Types: Cigarettes   Start date: 06/16/1972  Smokeless Tobacco Never  Tobacco Comments   3/4 pack a day sicne age 75   BP Readings from Last 3 Encounters:  02/26/22 (!) 108/58  11/27/21 125/64  08/22/21 132/81   Pulse Readings from Last 3 Encounters:  02/26/22 86  11/27/21 74  08/22/21 76   Wt Readings from Last 3 Encounters:  02/26/22 241 lb (109.3 kg)   11/27/21 254 lb (115.2 kg)  08/22/21 249 lb (112.9 kg)    Assessment: Review of patient past medical history, allergies, medications, health status, including review of consultants reports, laboratory and other test data, was performed as part of comprehensive evaluation and provision of chronic care management services.   SDOH:  (Social Determinants of Health) assessments and interventions performed:    CCM Care Plan  Allergies  Allergen Reactions   Fenofibrate Other (See Comments)    Flu-like    Penicillin G Other (See Comments)    Blisters in mouth   Pravastatin Other (See Comments)    "flu like  symptoms"    Statins     Medications Reviewed Today     Reviewed by Lavera Guise, [facility] (Pharmacist) on 04/01/22 at 26  Med List Status: <None>   Medication Order Taking? Sig Documenting Provider Last Dose Status Informant  albuterol (PROVENTIL) (2.5 MG/3ML) 0.083% nebulizer solution [id number] Yes Take 3 mLs (2.5 mg total) by nebulization every 6 (six) hours as needed for wheezing or shortness of breath. Ivy Lynn, NP  Active   albuterol (VENTOLIN HFA) 108 (90 Base) MCG/ACT inhaler [id number] Yes Inhale 2 puffs into the lungs every 6 (six) hours as needed. For shortness of breath [name], MD  Active   budesonide-formoterol [facility]) 160-4.5 MCG/ACT inhaler [id number] Yes Inhale 2 puffs into the lungs 2 (two) times daily. [name], [name], MD  Active   citalopram (CELEXA) 40 MG tablet [id number] Yes TAKE ONE (1) TABLET EACH DAY [name], [name], MD  Active   cyanocobalamin ((VITAMIN B-12)) injection 1,000 mcg [id number]   [name], [name], MD  Active            Med Note Celesta Aver, [facility] M   Thu Aug 08, 2021 10:33 AM) taking  empagliflozin (JARDIANCE) 25 MG TABS tablet [id number] Yes Take 1 tablet (25 mg total) by mouth daily before breakfast. [name], [name], MD  Active   ezetimibe (ZETIA) 10 MG tablet [id number] Yes Take 1 tablet (10 mg total) by mouth  daily. [name], [name], MD  Active   gabapentin (NEURONTIN) 600 MG tablet [id number] Yes TAKE ONE (1) TABLET THREE (3) TIMES EACH DAY [name], [name], MD  Active   Melatonin 10 MG TABS [id number] Yes Take 1 tablet by mouth at bedtime. [provider]  Active Self  metFORMIN (GLUCOPHAGE) 500 MG tablet [id number] Yes Take 1 tablet (500 mg total) by mouth 2 (two) times daily with a meal. [name], [name], MD  Active   pantoprazole (PROTONIX) 40 MG tablet [id number] Yes TAKE ONE (1) TABLET EACH DAY [name], [name], MD  Active   rosuvastatin (CRESTOR) 5 MG tablet [id number] Yes Take 1 tablet (5 mg total) by mouth daily. Takes one every other day [name], [name], MD  Active   Semaglutide,0.25 or 0.5MG/DOS, 2 MG/3ML SOPN [id number] Yes Inject 0.5 mg into the skin once a week. [name], [name], MD  Active   Tiotropium Bromide Monohydrate (SPIRIVA RESPIMAT) 2.5 MCG/ACT AERS [id number] Yes Inhale 2 puffs into the lungs daily. [name], [name], MD  Active   triamcinolone cream (KENALOG) 0.1 % [id number] Yes  [provider]  Active             Patient Active Problem List   Diagnosis Date Noted   Skin cancer of trunk 01/15/2018   History of arthroplasty of left knee 10/02/2017   Urinary incontinence without sensory awareness 01/02/2017   COPD  GOLD II/ AB 07/24/2016   S/P hysterectomy 03/03/2016   Type 2 diabetes mellitus with other specified complication ([place]) 93/81/0175   Generalized anxiety disorder 07/30/2015   OA (osteoarthritis) of knee 01/22/2015   B12 deficiency anemia 03/20/2014   Morbid obesity ([place]) 03/20/2014   Gastroesophageal reflux disease without esophagitis 02/21/2014   Multiple sclerosis ([place]) 01/17/2013   COPD with chronic bronchitis ([place]) 01/17/2013   Mixed hyperlipidemia 03/30/2012   Cigarette smoker 03/30/2012    Immunization History  Administered Date(s) Administered   Influenza,inj,Quad PF,6+ Mos 06/06/2013   Pneumococcal  Conjugate-13 03/29/2018   Pneumococcal Polysaccharide-23 09/12/2013   Tdap 10/02/2014  Conditions to be addressed/monitored: COPD and DMII  Care Plan : PHARMD MEDICATION MANAGEMENT  Updates made by Lavera Guise, RPH since 04/01/2022 12:00 AM     Problem: DISEASE PROGRESSION PREVENTION      Long-Range Goal: T2DM, COPD   This Visit's Progress: Not on track  Priority: High  Note:   Current Barriers:  Unable to independently afford treatment regimen Suboptimal therapeutic regimen for T2DM, COPD  Pharmacist Clinical Goal(s):  patient will verbalize ability to afford treatment regimen achieve control of T2DM, COPD as evidenced by IMPROVED QUALITY OF LIFE AND GOAL LABS maintain control of T2DM, COPD as evidenced by IMPROVED QUALITY OF LIFE AND GOAL LABS  through collaboration with PharmD and provider.    Interventions: 1:1 collaboration with [name], [name], MD regarding development and update of comprehensive plan of care as evidenced by provider attestation and co-signature Inter-disciplinary care team collaboration (see longitudinal plan of care) Comprehensive medication review performed; medication list updated in electronic medical record  Diabetes: New goal. Uncontrolled A1C 7.9%, GFR 68; current treatment:OZEMPIC 0.5MG WEEKLY, JARDIANCE 25MG DAILY, METFORMIN 500MG TWICE DAILY;  Current exercise: ENCOURAGED Educated on [place] TITRATION--INCREASE TO 0.5MG WEEKLY; WE CAN CONTINUE TO INCREASE UNTIL 2MG WEEKLY; PATIENT EXPERIENCING A LITTLE NAUSEA; GOING SLOW Recommended OZEMPIC, JARDIANCE Assessed patient finances. ENROLLED IN NOVO [place]; WILL SUBMIT APPLICATION FOR JARDIANCE TODAY Chronic Obstructive Pulmonary Disease:  New goal. controlled; current treatment: USES ONLY SYMBICORT/rescue albuterol; does not take spiriva although patient could benefit from triple therapy NO patient assistance available Recommended breztri, but patient states she was worse  on breztri x 3 months  Most recent Pulmonary Function Testing:  0 exacerbations requiring treatment in the last 6 months  Assessed patient finances. No assistance available for symbicort; patient to continue purchasing symbicort   Patient Goals/Self-Care Activities patient will:  - take medications as prescribed as evidenced by patient report and record review check glucose DAILY (FASTING) OR IF SYMPTPOMATIC, document, and provide at future appointments collaborate with provider on medication access solutions target a minimum of 150 minutes of moderate intensity exercise weekly engage in dietary modifications by   FOLLOWING A HEART HEALTHY DIET/HEALTHY PLATE METHOD      Plan: Telephone follow up appointment with care management team member scheduled for:  2 MONTHS   Regina Eck, PharmD, BCPS Clinical Pharmacist, [place]  II Phone [phone]

## 2022-04-01 NOTE — Patient Instructions (Addendum)
Visit Information  Following are the goals we discussed today:  Current Barriers:  Unable to independently afford treatment regimen Suboptimal therapeutic regimen for T2DM, COPD  Pharmacist Clinical Goal(s):  patient will verbalize ability to afford treatment regimen achieve control of T2DM, COPD as evidenced by IMPROVED QUALITY OF LIFE AND GOAL LABS maintain control of T2DM, COPD as evidenced by IMPROVED QUALITY OF LIFE AND GOAL LABS  through collaboration with PharmD and provider.    Interventions: 1:1 collaboration with [name], [name], MD regarding development and update of comprehensive plan of care as evidenced by provider attestation and co-signature Inter-disciplinary care team collaboration (see longitudinal plan of care) Comprehensive medication review performed; medication list updated in electronic medical record  Diabetes: New goal. Uncontrolled A1C 7.9%, GFR 68; current treatment:OZEMPIC 0.'5MG'$  WEEKLY, JARDIANCE '25MG'$  DAILY, METFORMIN '500MG'$  TWICE DAILY;  Current exercise: ENCOURAGED Educated on [place] 0.'5MG'$  WEEKLY; WE CAN CONTINUE TO INCREASE UNTIL '2MG'$  WEEKLY; PATIENT EXPERIENCING A LITTLE NAUSEA; GOING SLOW Recommended OZEMPIC, JARDIANCE Assessed patient finances. ENROLLED IN NOVO [place]; WILL SUBMIT APPLICATION FOR JARDIANCE TODAY Chronic Obstructive Pulmonary Disease:  New goal. controlled; current treatment: USES ONLY SYMBICORT/rescue albuterol; does not take spiriva although patient could benefit from triple therapy NO patient assistance available Recommended breztri, but patient states she was worse on breztri x 3 months  Most recent Pulmonary Function Testing:  0 exacerbations requiring treatment in the last 6 months  Assessed patient finances. No assistance available for symbicort; patient to continue purchasing symbicort   Patient Goals/Self-Care Activities patient will:  - take medications as prescribed as evidenced by  patient report and record review check glucose DAILY (FASTING) OR IF SYMPTPOMATIC, document, and provide at future appointments collaborate with provider on medication access solutions target a minimum of 150 minutes of moderate intensity exercise weekly engage in dietary modifications by FOLLOWING A HEART HEALTHY DIET/HEALTHY PLATE METHOD    Plan: Telephone follow up appointment with care management team member scheduled for:  2 months  Signature Regina Eck, PharmD, BCPS Clinical Pharmacist, [place]  II Phone [phone]   Please call the care guide team at [phone] if you need to cancel or reschedule your appointment.   The patient verbalized understanding of instructions, educational materials, and care plan provided today and DECLINED offer to receive copy of patient instructions, educational materials, and care plan.

## 2022-04-07 ENCOUNTER — Ambulatory Visit (INDEPENDENT_AMBULATORY_CARE_PROVIDER_SITE_OTHER): Payer: Medicare HMO | Admitting: *Deleted

## 2022-04-07 DIAGNOSIS — E538 Deficiency of other specified B group vitamins: Secondary | ICD-10-CM

## 2022-04-07 DIAGNOSIS — D518 Other vitamin B12 deficiency anemias: Secondary | ICD-10-CM

## 2022-04-07 NOTE — Progress Notes (Signed)
Vitamin b12 injection given and patient tolerated well.  Patients Ozempic patient assistance medication also received in office and given to patient.

## 2022-04-09 ENCOUNTER — Ambulatory Visit: Payer: Medicare HMO

## 2022-04-10 ENCOUNTER — Other Ambulatory Visit: Payer: Self-pay | Admitting: Family Medicine

## 2022-04-10 DIAGNOSIS — J449 Chronic obstructive pulmonary disease, unspecified: Secondary | ICD-10-CM

## 2022-04-10 DIAGNOSIS — E1169 Type 2 diabetes mellitus with other specified complication: Secondary | ICD-10-CM

## 2022-04-10 DIAGNOSIS — Z7985 Long-term (current) use of injectable non-insulin antidiabetic drugs: Secondary | ICD-10-CM | POA: Diagnosis not present

## 2022-04-21 NOTE — H&P (Cosign Needed Addendum)
TOTAL HIP ADMISSION H&P  Patient is admitted for right total hip arthroplasty.  Subjective:  Chief Complaint: Right hip pain  HPI: [name], 66 y.o. female, has a history of pain and functional disability in the right hip due to arthritis and patient has failed non-surgical conservative treatments for greater than 12 weeks to include NSAID's and/or analgesics, corticosteriod injections, and activity modification. Onset of symptoms was gradual, starting  several  years ago with gradually worsening course since that time. The patient noted no past surgery on the right hip. Patient currently rates pain in the right hip at 8 out of 10 with activity. Patient has night pain, worsening of pain with activity and weight bearing, pain that interfers with activities of daily living, and pain with passive range of motion. Patient has evidence of  central and inferior posterior bone-on-bone changes in the hips. She has some joint space left superiorly  by imaging studies. This condition presents safety issues increasing the risk of falls. There is no current active infection.  Patient Active Problem List   Diagnosis Date Noted   Skin cancer of trunk 01/15/2018   History of arthroplasty of left knee 10/02/2017   Urinary incontinence without sensory awareness 01/02/2017   COPD  GOLD II/ AB 07/24/2016   S/P hysterectomy 03/03/2016   Type 2 diabetes mellitus with other specified complication ([place]) 28/36/6294   Generalized anxiety disorder 07/30/2015   OA (osteoarthritis) of knee 01/22/2015   B12 deficiency anemia 03/20/2014   Morbid obesity ([place]) 03/20/2014   Gastroesophageal reflux disease without esophagitis 02/21/2014   Multiple sclerosis ([place]) 01/17/2013   COPD with chronic bronchitis ([place]) 01/17/2013   Mixed hyperlipidemia 03/30/2012   Cigarette smoker 03/30/2012    Past Medical History:  Diagnosis Date   Anxiety    Arthritis    COPD (chronic obstructive pulmonary disease) (HCC)    GERD  (gastroesophageal reflux disease)    History of skin cancer    Migraine    Mixed hyperlipidemia    Multiple sclerosis ([place])    Left-sided weakness   Neuropathy    Pre-diabetes    Psoriasis    Seasonal allergies     Past Surgical History:  Procedure Laterality Date   ABDOMINAL HYSTERECTOMY     CHOLECYSTECTOMY     COLONOSCOPY N/A 08/02/2015   Procedure: COLONOSCOPY;  Surgeon: Rogene Houston, MD;  Location: [facility];  Service: Endoscopy;  Laterality: N/A;  12:00-moved to 1220 Ann to notify pt   KNEE ARTHROSCOPY     RT   LAPAROSCOPIC APPENDECTOMY N/A 02/12/2017   Procedure: APPENDECTOMY LAPAROSCOPIC;  Surgeon: Aviva Signs, MD;  Location: [facility];  Service: General;  Laterality: N/A;   Left breast lumpectomy     Left carpal tunnel release     PARTIAL KNEE ARTHROPLASTY Left 01/22/2015   Procedure: LEFT KNEE MEDIAL UNICOMPARTMENTAL KNEE;  Surgeon: Gaynelle Arabian, MD;  Location: [facility];  Service: Orthopedics;  Laterality: Left;   Right total knee replacement  2010   SHOULDER SURGERY Left    Sinus cyst removal     TOTAL KNEE REVISION  05/05/2012   Procedure: TOTAL KNEE REVISION;  Surgeon: Gearlean Alf, MD;  Location: [facility];  Service: Orthopedics;  Laterality: Right;   WRIST SURGERY     rt    Prior to Admission medications   Medication Sig Start Date End Date Taking? Authorizing Provider  albuterol (PROVENTIL) (2.5 MG/3ML) 0.083% nebulizer solution Take 3 mLs (2.5 mg total) by nebulization every 6 (  six) hours as needed for wheezing or shortness of breath. 07/23/21   Ivy Lynn, NP  albuterol (VENTOLIN HFA) 108 (90 Base) MCG/ACT inhaler Inhale 2 puffs into the lungs every 6 (six) hours as needed. For shortness of breath 03/16/17   [name], MD  citalopram (CELEXA) 40 MG tablet TAKE ONE (1) TABLET EACH DAY 02/26/22   [name], [name], MD  empagliflozin (JARDIANCE) 25 MG TABS tablet Take 1 tablet (25 mg total) by mouth daily before breakfast. 11/29/21   [name], [name], MD  ezetimibe (ZETIA) 10 MG tablet Take 1 tablet (10 mg total) by mouth daily. 12/02/21   [name], [name], MD  gabapentin (NEURONTIN) 600 MG tablet TAKE ONE (1) TABLET THREE (3) TIMES EACH DAY 02/26/22   [name], [name], MD  Melatonin 10 MG TABS Take 1 tablet by mouth at bedtime.    [provider]  metFORMIN (GLUCOPHAGE) 500 MG tablet Take 1 tablet (500 mg total) by mouth 2 (two) times daily with a meal. 11/27/21   [name], [name], MD  pantoprazole (PROTONIX) 40 MG tablet TAKE ONE (1) TABLET EACH DAY 11/27/21   [name], [name], MD  rosuvastatin (CRESTOR) 5 MG tablet Take 1 tablet (5 mg total) by mouth daily. Takes one every other day 11/27/21   [name], [name], MD  Semaglutide,0.25 or 0.'5MG'$ /DOS, 2 MG/3ML SOPN Inject 0.5 mg into the skin once a week. 02/26/22   [name], [name], MD  SYMBICORT 160-4.5 MCG/ACT inhaler USE 2 PUFFS TWICE DAILY 04/10/22   [name], [name], MD  Tiotropium Bromide Monohydrate (SPIRIVA RESPIMAT) 2.5 MCG/ACT AERS Inhale 2 puffs into the lungs daily. Patient not taking: Reported on 04/01/2022 11/27/21   [name], [name], MD  triamcinolone cream (KENALOG) 0.1 %  10/13/19   [provider]    Allergies  Allergen Reactions   Fenofibrate Other (See Comments)    Flu-like    Penicillin G Other (See Comments)    Blisters in mouth   Pravastatin Other (See Comments)    "flu like symptoms"    Statins     Social History   Socioeconomic History   Marital status: Married    Spouse name: [name]    Number of children: 2   Years of education: Not on file   Highest education level: Some college, no degree  Occupational History   Occupation: diabled    Comment: Designer, multimedia  Tobacco Use   Smoking status: Every Day    Packs/day: 0.50    Years: 40.00    Total pack years: 20.00    Types: Cigarettes    Start date: 06/16/1972   Smokeless tobacco: Never   Tobacco comments:    3/4 pack a day sicne age 53  Vaping Use   Vaping  Use: Never used  Substance and Sexual Activity   Alcohol use: No    Alcohol/week: 0.0 standard drinks of alcohol   Drug use: No   Sexual activity: Not on file  Other Topics Concern   Not on file  Social History Narrative   Disabled, married, lives with husband [place]. 2 children  - 9 grandchildren, 2 dogs   Children live within 10 miles   Social Determinants of Health   Financial Resource Strain: Low Risk  (04/08/2021)   Overall Financial Resource Strain (CARDIA)    Difficulty of Paying Living Expenses: Not hard at all  Food Insecurity: No Food Insecurity (04/08/2021)   Hunger Vital Sign  Worried About Charity fundraiser in the Last Year: Never true    [place] in the Last Year: Never true  Transportation Needs: No Transportation Needs (04/08/2021)   PRAPARE - Hydrologist (Medical): No    Lack of Transportation (Non-Medical): No  Physical Activity: Insufficiently Active (04/08/2021)   Exercise Vital Sign    Days of Exercise per Week: 3 days    Minutes of Exercise per Session: 20 min  Stress: No Stress Concern Present (04/08/2021)   [place]    Feeling of Stress : Only a little  Social Connections: Socially Integrated (04/08/2021)   Social Connection and Isolation Panel [NHANES]    Frequency of Communication with Friends and Family: More than three times a week    Frequency of Social Gatherings with Friends and Family: Once a week    Attends Religious Services: More than 4 times per year    Active Member of [employer] or Organizations: Yes    Attends Music therapist: More than 4 times per year    Marital Status: Married  Human resources officer Violence: Not At Risk (04/08/2021)   Humiliation, Afraid, Rape, and Kick questionnaire    Fear of Current or Ex-Partner: No    Emotionally Abused: No    Physically Abused: No    Sexually Abused: No    Tobacco Use: High Risk  (02/26/2022)   Patient History    Smoking Tobacco Use: Every Day    Smokeless Tobacco Use: Never    Passive Exposure: Not on file   Social History   Substance and Sexual Activity  Alcohol Use No   Alcohol/week: 0.0 standard drinks of alcohol    Family History  Problem Relation Age of Onset   Dementia Mother    Thyroid disease Mother        goiter    Diabetes Mother    Atrial fibrillation Father        Died with pulmonary embolus   Emphysema Father    Heart disease Father    Other Father        blood clots    Celiac disease Father    GI problems Father        dumping syndrome - malabsorbtion    Cancer - Colon Maternal Grandmother    Dementia Maternal Grandmother    Heart disease Maternal Grandfather    Stroke Maternal Grandfather    Other Sister        hereditary = blood clots    Parkinson's disease Brother    Alcohol abuse Brother    Arthritis Son    Heart attack Paternal Grandmother    Other Paternal Grandfather        appendix rupture   Hypertension Brother    Hypertension Son     Review of Systems  Constitutional:  Negative for chills and fever.  HENT:  Negative for congestion, sore throat and tinnitus.   Eyes:  Negative for double vision, photophobia and pain.  Respiratory:  Negative for cough, shortness of breath and wheezing.   Cardiovascular:  Negative for chest pain, palpitations and orthopnea.  Gastrointestinal:  Negative for heartburn, nausea and vomiting.  Genitourinary:  Negative for dysuria, frequency and urgency.  Musculoskeletal:  Positive for joint pain.  Neurological:  Negative for dizziness, weakness and headaches.     Objective:  Physical Exam: Well nourished and well developed.  General: Alert and oriented x3, cooperative  and pleasant, no acute distress.  Head: normocephalic, atraumatic, neck supple.  Eyes: EOMI.  Musculoskeletal:   Right Hip Exam:   The range of motion: Flexion to 110 degrees, Internal Rotation to 10 degrees,  External Rotation to 20 degrees, and abduction to 20 degrees without discomfort.   There is no tenderness over the greater trochanteric bursa.   There is no pain on provocative testing of the hip.  Calves soft and nontender. Motor function intact in LE. Strength 5/5 LE bilaterally. Neuro: Distal pulses 2+. Sensation to light touch intact in LE.   Imaging Review Plain radiographs demonstrate severe degenerative joint disease of the right hip. The bone quality appears to be adequate for age and reported activity level.  Assessment/Plan:  End stage arthritis, right hip  The patient history, physical examination, clinical judgement of the provider and imaging studies are consistent with end stage degenerative joint disease of the right hip and total hip arthroplasty is deemed medically necessary. The treatment options including medical management, injection therapy, arthroscopy and arthroplasty were discussed at length. The risks and benefits of total hip arthroplasty were presented and reviewed. The risks due to aseptic loosening, infection, stiffness, dislocation/subluxation, thromboembolic complications and other imponderables were discussed. The patient acknowledged the explanation, agreed to proceed with the plan and consent was signed. Patient is being admitted for inpatient treatment for surgery, pain control, PT, OT, prophylactic antibiotics, VTE prophylaxis, progressive ambulation and ADLs and discharge planning.The patient is planning to be discharged  home .   Patient's anticipated LOS is less than 2 midnights, meeting these requirements: - Younger than 45 - Lives within 1 hour of care - Has a competent adult at home to recover with post-op recover - NO history of  - Chronic pain requiring opioids  - Coronary Artery Disease  - Heart failure  - Heart attack  - Stroke  - DVT/VTE  - Cardiac arrhythmia  - Respiratory Failure  - Renal failure  - Anemia  - Advanced Liver  disease  Therapy Plans: HEP Disposition: Home with family Planned DVT Prophylaxis: Aspirin 325 mg BID DME Needed: None PCP: Caryl Pina, MD (clearance received) TXA: IV Allergies: PCN (rash) Anesthesia Concerns: Nausea/vomiting BMI: 35.1 Last HgbA1c: 7.9% (02/26/22) Pharmacy[facility]  Other: - Hypotension after surgery, usually requires fluid boluses - Started on ozempic in July after most recent HgbA1c, rechecking with preop labs - Tolerates hydrocodone well  ADDENDUM: Preop HgbA1c 6.8%. Cleared for surgery.  - Patient was instructed on what medications to stop prior to surgery. - Follow-up visit in 2 weeks with Dr. Wynelle Link - Begin physical therapy following surgery - Pre-operative lab work as pre-surgical testing - Prescriptions will be provided in hospital at time of discharge  Theresa Duty, PA-C Orthopedic Surgery [facility] [place]

## 2022-04-24 DIAGNOSIS — L821 Other seborrheic keratosis: Secondary | ICD-10-CM | POA: Diagnosis not present

## 2022-04-24 DIAGNOSIS — D225 Melanocytic nevi of trunk: Secondary | ICD-10-CM | POA: Diagnosis not present

## 2022-04-24 DIAGNOSIS — D1801 Hemangioma of skin and subcutaneous tissue: Secondary | ICD-10-CM | POA: Diagnosis not present

## 2022-04-24 DIAGNOSIS — Z1283 Encounter for screening for malignant neoplasm of skin: Secondary | ICD-10-CM | POA: Diagnosis not present

## 2022-04-24 DIAGNOSIS — D485 Neoplasm of uncertain behavior of skin: Secondary | ICD-10-CM | POA: Diagnosis not present

## 2022-04-29 NOTE — Patient Instructions (Addendum)
DUE TO COVID-19 ONLY TWO VISITORS  (aged 64 and older)  ARE ALLOWED TO COME WITH YOU AND STAY IN THE WAITING ROOM ONLY DURING PRE OP AND PROCEDURE.   **NO VISITORS ARE ALLOWED IN THE SHORT STAY AREA OR RECOVERY ROOM!!**  IF YOU WILL BE ADMITTED INTO THE HOSPITAL YOU ARE ALLOWED ONLY FOUR SUPPORT PEOPLE DURING VISITATION HOURS ONLY (7 AM -8PM)   The support person(s) must pass our screening, gel in and out, and wear a mask at all times, including in the patient's room. Patients must also wear a mask when staff or their support person are in the room. Visitors GUEST BADGE MUST BE WORN VISIBLY  One adult visitor may remain with you overnight and MUST be in the room by 8 P.M.     Your procedure is scheduled on: 05/14/22   Report to [facility] Main Entrance    Report to admitting at   6:00 AM   Call this number if you have problems the morning of surgery [phone]   Do not eat food :After Midnight.   After Midnight you may have the following liquids until _5:30_____ AM/ DAY OF SURGERY  Water Black Coffee (sugar ok, NO MILK/CREAM OR CREAMERS)  Tea (sugar ok, NO MILK/CREAM OR CREAMERS) regular and decaf                             Plain Jell-O (NO RED)                                           Fruit ices (not with fruit pulp, NO RED)                                     Popsicles (NO RED)                                                                  Juice: apple, WHITE grape, WHITE cranberry Sports drinks like Gatorade (NO RED)              Drink 2 Ensure/G2 drinks AT 10:00 PM the night before surgery.        The day of surgery:  Drink G2 at  5:15 AM the morning of surgery. Drink in one sitting. Do not sip.  This drink was given to you during your hospital  pre-op appointment visit. Nothing else to drink after completing the  G2. At 5:30 AM          If you have questions, please contact your surgeon's office.       Oral Hygiene is also important to reduce your  risk of infection.                                    Remember - BRUSH YOUR TEETH THE MORNING OF SURGERY WITH YOUR REGULAR TOOTHPASTE   Do NOT smoke after Midnight   Take these medicines the morning of surgery with  A SIP OF WATER: Gabapentin, Citalopram, Pantoprozle, Use your inhaler and bring it with you to the hospital  DO NOT [place] for 3 days, don't take Ozempic  Bring CPAP mask and tubing day of surgery.                              You may not have any metal on your body including hair pins, jewelry, and body piercing             Do not wear make-up, lotions, powders, perfumes/cologne, or deodorant  Do not wear nail polish including gel and S&S, artificial/acrylic nails, or any other type of covering on natural nails including finger and toenails. If you have artificial nails, gel coating, etc. that needs to be removed by a nail salon please have this removed prior to surgery or surgery may need to be canceled/ delayed if the surgeon/ anesthesia feels like they are unable to be safely monitored.   Do not shave  48 hours prior to surgery.    Do not bring valuables to the hospital. [place].   Contacts, dentures or bridgework may not be worn into surgery.   Bring small overnight bag day of surgery.   DO NOT [place]. PHARMACY WILL DISPENSE MEDICATIONS LISTED ON YOUR MEDICATION LIST TO YOU DURING YOUR ADMISSION [place]!       Special Instructions: Bring a copy of your healthcare power of attorney and living will documents   the day of surgery if you haven't scanned them before.              Please read over the following fact sheets you were given: IF YOU HAVE QUESTIONS ABOUT YOUR PRE-OP INSTRUCTIONS PLEASE CALL [phone]     [facility] - Preparing for Surgery Before surgery, you can play an important role.  Because skin  is not sterile, your skin needs to be as free of germs as possible.  You can reduce the number of germs on your skin by washing with CHG (chlorahexidine gluconate) soap before surgery.  CHG is an antiseptic cleaner which kills germs and bonds with the skin to continue killing germs even after washing. Please DO NOT use if you have an allergy to CHG or antibacterial soaps.  If your skin becomes reddened/irritated stop using the CHG and inform your nurse when you arrive at Short Stay. Do not shave (including legs and underarms) for at least 48 hours prior to the first CHG shower.   Please follow these instructions carefully:  1.  Shower with CHG Soap the night before surgery and the  morning of Surgery.  2.  If you choose to wash your hair, wash your hair first as usual with your  normal  shampoo.  3.  After you shampoo, rinse your hair and body thoroughly to remove the  shampoo.                            4.  Use CHG as you would any other liquid soap.  You can apply chg directly  to the skin and wash  Gently with a scrungie or clean washcloth.  5.  Apply the CHG Soap to your body ONLY FROM THE NECK DOWN.   Do not use on face/ open                           Wound or open sores. Avoid contact with eyes, ears mouth and genitals (private parts).                       Wash face,  Genitals (private parts) with your normal soap.             6.  Wash thoroughly, paying special attention to the area where your surgery  will be performed.  7.  Thoroughly rinse your body with warm water from the neck down.  8.  DO NOT shower/wash with your normal soap after using and rinsing off  the CHG Soap.                9.  Pat yourself dry with a clean towel.            10.  Wear clean pajamas.            11.  Place clean sheets on your bed the night of your first shower and do not  sleep with pets. Day of Surgery : Do not apply any lotions/deodorants the morning of surgery.  Please wear clean  clothes to the hospital/surgery center.  FAILURE TO FOLLOW THESE INSTRUCTIONS MAY RESULT IN THE CANCELLATION OF YOUR SURGERY    ________________________________________________________________________   Incentive Spirometer  An incentive spirometer is a tool that can help keep your lungs clear and active. This tool measures how well you are filling your lungs with each breath. Taking long deep breaths may help reverse or decrease the chance of developing breathing (pulmonary) problems (especially infection) following: A long period of time when you are unable to move or be active. BEFORE THE PROCEDURE  If the spirometer includes an indicator to show your best effort, your nurse or respiratory therapist will set it to a desired goal. If possible, sit up straight or lean slightly forward. Try not to slouch. Hold the incentive spirometer in an upright position. INSTRUCTIONS FOR USE  Sit on the edge of your bed if possible, or sit up as far as you can in bed or on a chair. Hold the incentive spirometer in an upright position. Breathe out normally. Place the mouthpiece in your mouth and seal your lips tightly around it. Breathe in slowly and as deeply as possible, raising the piston or the ball toward the top of the column. Hold your breath for 3-5 seconds or for as long as possible. Allow the piston or ball to fall to the bottom of the column. Remove the mouthpiece from your mouth and breathe out normally. Rest for a few seconds and repeat Steps 1 through 7 at least 10 times every 1-2 hours when you are awake. Take your time and take a few normal breaths between deep breaths. The spirometer may include an indicator to show your best effort. Use the indicator as a goal to work toward during each repetition. After each set of 10 deep breaths, practice coughing to be sure your lungs are clear. If you have an incision (the cut made at the time of surgery), support your incision when coughing by  placing a pillow or rolled up towels firmly against it.  Once you are able to get out of bed, walk around indoors and cough well. You may stop using the incentive spirometer when instructed by your caregiver.  RISKS AND COMPLICATIONS Take your time so you do not get dizzy or light-headed. If you are in pain, you may need to take or ask for pain medication before doing incentive spirometry. It is harder to take a deep breath if you are having pain. AFTER USE Rest and breathe slowly and easily. It can be helpful to keep track of a log of your progress. Your caregiver can provide you with a simple table to help with this. If you are using the spirometer at home, follow these instructions: [place] IF:  You are having difficultly using the spirometer. You have trouble using the spirometer as often as instructed. Your pain medication is not giving enough relief while using the spirometer. You develop fever of 100.5 F (38.1 C) or higher. SEEK IMMEDIATE MEDICAL CARE IF:  You cough up bloody sputum that had not been present before. You develop fever of 102 F (38.9 C) or greater. You develop worsening pain at or near the incision site. MAKE SURE YOU:  Understand these instructions. Will watch your condition. Will get help right away if you are not doing well or get worse. Document Released: 12/08/2006 Document Revised: 10/20/2011 Document Reviewed: 02/08/2007 [facility] Patient Information 2014 [place], [place].   ________________________________________________________________________

## 2022-04-30 ENCOUNTER — Ambulatory Visit: Payer: Medicare HMO | Admitting: Family Medicine

## 2022-05-05 ENCOUNTER — Other Ambulatory Visit: Payer: Self-pay

## 2022-05-05 ENCOUNTER — Encounter (HOSPITAL_COMMUNITY): Payer: Self-pay

## 2022-05-05 ENCOUNTER — Encounter (HOSPITAL_COMMUNITY)
Admission: RE | Admit: 2022-05-05 | Discharge: 2022-05-05 | Disposition: A | Discharge status: Home / Self Care | Payer: Medicare HMO | Source: Ambulatory Visit | Attending: Orthopedic Surgery | Admitting: Orthopedic Surgery

## 2022-05-05 DIAGNOSIS — M1611 Unilateral primary osteoarthritis, right hip: Secondary | ICD-10-CM | POA: Diagnosis not present

## 2022-05-05 DIAGNOSIS — E1169 Type 2 diabetes mellitus with other specified complication: Secondary | ICD-10-CM | POA: Insufficient documentation

## 2022-05-05 DIAGNOSIS — Z01812 Encounter for preprocedural laboratory examination: Secondary | ICD-10-CM | POA: Diagnosis not present

## 2022-05-05 DIAGNOSIS — Z01818 Encounter for other preprocedural examination: Secondary | ICD-10-CM

## 2022-05-05 HISTORY — DX: Other complications of anesthesia, initial encounter: T88.59XA

## 2022-05-05 HISTORY — DX: Type 2 diabetes mellitus without complications: E11.9

## 2022-05-05 LAB — CBC
HCT: 47 % — ABNORMAL HIGH (ref 36.0–46.0)
Hemoglobin: 14.8 g/dL (ref 12.0–15.0)
MCH: 28 pg (ref 26.0–34.0)
MCHC: 31.5 g/dL (ref 30.0–36.0)
MCV: 88.8 fL (ref 80.0–100.0)
Platelets: 275 10*3/uL (ref 150–400)
RBC: 5.29 MIL/uL — ABNORMAL HIGH (ref 3.87–5.11)
RDW: 14.8 % (ref 11.5–15.5)
WBC: 9.7 10*3/uL (ref 4.0–10.5)
nRBC: 0 % (ref 0.0–0.2)

## 2022-05-05 LAB — HEMOGLOBIN A1C
Hgb A1c MFr Bld: 6.8 % — ABNORMAL HIGH (ref 4.8–5.6)
Mean Plasma Glucose: 148.46 mg/dL

## 2022-05-05 LAB — BASIC METABOLIC PANEL
Anion gap: 8 (ref 5–15)
BUN: 11 mg/dL (ref 8–23)
CO2: 27 mmol/L (ref 22–32)
Calcium: 8.9 mg/dL (ref 8.9–10.3)
Chloride: 104 mmol/L (ref 98–111)
Creatinine, Ser: 0.93 mg/dL (ref 0.44–1.00)
GFR, Estimated: >60 mL/min (ref >60)
Glucose, Bld: 130 mg/dL — ABNORMAL HIGH (ref 70–99)
Potassium: 4 mmol/L (ref 3.5–5.1)
Sodium: 139 mmol/L (ref 135–145)

## 2022-05-05 LAB — SURGICAL PCR SCREEN
MRSA, PCR: NEGATIVE
Staphylococcus aureus: NEGATIVE

## 2022-05-05 LAB — GLUCOSE, CAPILLARY: Glucose-Capillary: 143 mg/dL — ABNORMAL HIGH (ref 70–99)

## 2022-05-05 NOTE — Pre-Procedure Instructions (Signed)
Anesthesia note:  Bowel prep reminder:NA  PCP - Dr. Lenna Sciara. [name] Cardiologist -Dr. Myles Gip Other-   Chest x-ray - 07/23/21-epic EKG - 02/26/22-epic Stress Test - no ECHO - 01/05/20-epic Cardiac Cath - no  Pacemaker/ICD device last checked:NA  Sleep Study - yes, very mild CPAP - no  Fasting Blood Sugar - 102-107. CBG was 143 at PAT visit Checks Blood Sugar _1 or 2 times a week____  Blood Thinner:NA Blood Thinner Instructions: Aspirin Instructions: Last Dose:  Anesthesia review: yes  Patient denies shortness of breath, fever, cough and chest pain at PAT appointment Pt has COPB and uses inhaler daily but reports no SOB with activities. She is a smoker. Her lt side is weal because of MS.   Patient verbalized understanding of instructions that were given to them at the PAT appointment. Patient was also instructed that they will need to review over the PAT instructions again at home before surgery. Yes. Pt indicated understanding

## 2022-05-07 ENCOUNTER — Ambulatory Visit (INDEPENDENT_AMBULATORY_CARE_PROVIDER_SITE_OTHER): Payer: Medicare HMO | Admitting: Family Medicine

## 2022-05-07 ENCOUNTER — Encounter: Payer: Self-pay | Admitting: Family Medicine

## 2022-05-07 VITALS — BP 106/59 | HR 88 | Temp 98.0°F | Ht 70.0 in | Wt 226.0 lb

## 2022-05-07 DIAGNOSIS — E782 Mixed hyperlipidemia: Secondary | ICD-10-CM | POA: Diagnosis not present

## 2022-05-07 DIAGNOSIS — F411 Generalized anxiety disorder: Secondary | ICD-10-CM | POA: Diagnosis not present

## 2022-05-07 DIAGNOSIS — G35 Multiple sclerosis: Secondary | ICD-10-CM | POA: Diagnosis not present

## 2022-05-07 DIAGNOSIS — E1169 Type 2 diabetes mellitus with other specified complication: Secondary | ICD-10-CM

## 2022-05-07 DIAGNOSIS — J449 Chronic obstructive pulmonary disease, unspecified: Secondary | ICD-10-CM | POA: Diagnosis not present

## 2022-05-07 NOTE — Progress Notes (Signed)
BP (!) 106/59   Pulse 88   Temp 98 F (36.7 C)   Ht '5\' 10"'$  (1.778 m)   Wt 226 lb (102.5 kg)   SpO2 97%   BMI 32.43 kg/m    Subjective:   Patient ID: [name], female    DOB: 1956-05-18, 66 y.o.   MRN: [id number]  HPI: [name] is a 66 y.o. female presenting on 05/07/2022 for Medical Management of Chronic Issues and Diabetes   HPI Hyperlipidemia Patient is coming in for recheck of his hyperlipidemia. The patient is currently taking Crestor and Zetia. They deny any issues with myalgias or history of liver damage from it. They deny any focal numbness or weakness or chest pain.   Type 2 diabetes mellitus Patient comes in today for recheck of his diabetes. Patient has been currently taking Ozempic and Jardiance. Patient is not currently on an ACE inhibitor/ARB. Patient has seen an ophthalmologist this year. Patient denies any issues with their feet. The symptom started onset as an adult hyperlipidemia and COPD ARE RELATED TO DM   COPD Patient is coming in for COPD recheck today.  He is currently on albuterol as needed and Symbicort.  He has a mild chronic cough but denies any major coughing spells or wheezing spells.  He has 0 nighttime symptoms per week and 0 daytime symptoms per week currently.  She feels like she is doing very well right now and has not had any issues for some time.  Anxiety recheck. Patient is coming in today for anxiety recheck.  Patient is currently taking citalopram.  She feels like it is doing good for her.  She is in preparation for surgery coming up and she is very excited about a new great grandchild coming in denies any suicidal ideations or thoughts of hurting herself.    05/07/2022   11:23 AM 02/26/2022    2:05 PM 11/27/2021   11:20 AM 08/22/2021    3:39 PM 08/22/2021    3:36 PM  Depression screen PHQ 2/9  Decreased Interest '1 1 1 2 '$ 0  Down, Depressed, Hopeless 1 1 0 2 0  PHQ - 2 Score '2 2 1 4 '$ 0  Altered sleeping 0 '1 1 1 '$ 0  Tired,  decreased energy '1 1 2 3 '$ 0  Change in appetite '1 1 1 1 '$ 0  Feeling bad or failure about yourself  0 1 0 3 0  Trouble concentrating 0 1 0 1 0  Moving slowly or fidgety/restless 0 0 0  0  Suicidal thoughts 0 0 0  0  PHQ-9 Score '4 7 5 13 '$ 0  Difficult doing work/chores Not difficult at all Not difficult at all        Relevant past medical, surgical, family and social history reviewed and updated as indicated. Interim medical history since our last visit reviewed. Allergies and medications reviewed and updated.  Review of Systems  Constitutional:  Negative for chills and fever.  Eyes:  Negative for visual disturbance.  Respiratory:  Negative for chest tightness and shortness of breath.   Cardiovascular:  Negative for chest pain and leg swelling.  Musculoskeletal:  Positive for arthralgias. Negative for back pain and gait problem.  Skin:  Negative for rash.  Neurological:  Negative for light-headedness and headaches.  Psychiatric/Behavioral:  Negative for agitation and behavioral problems.   All other systems reviewed and are negative.   Per HPI unless specifically indicated above   Allergies as of 05/07/2022  Reactions   Fenofibrate Other (See Comments)   Flu-like    Penicillin G Other (See Comments)   Blisters in mouth   Pravastatin Other (See Comments)   "flu like symptoms"    Statins         Medication List        Accurate as of May 07, 2022 11:49 AM. If you have any questions, ask your nurse or doctor.          STOP taking these medications    metFORMIN 500 MG tablet Commonly known as: GLUCOPHAGE Stopped by: Worthy Rancher, MD   Spiriva Respimat 2.5 MCG/ACT Aers Generic drug: Tiotropium Bromide Monohydrate Stopped by: [name] Jodelle Fausto, MD       TAKE these medications    albuterol 108 (90 Base) MCG/ACT inhaler Commonly known as: Ventolin HFA Inhale 2 puffs into the lungs every 6 (six) hours as needed. For shortness of breath    albuterol (2.5 MG/3ML) 0.083% nebulizer solution Commonly known as: PROVENTIL Take 3 mLs (2.5 mg total) by nebulization every 6 (six) hours as needed for wheezing or shortness of breath.   citalopram 40 MG tablet Commonly known as: CELEXA TAKE ONE (1) TABLET EACH DAY   cycloSPORINE 0.05 % ophthalmic emulsion Commonly known as: RESTASIS Place 1 drop into both eyes 2 (two) times daily.   empagliflozin 25 MG Tabs tablet Commonly known as: Jardiance Take 1 tablet (25 mg total) by mouth daily before breakfast.   ezetimibe 10 MG tablet Commonly known as: Zetia Take 1 tablet (10 mg total) by mouth daily.   gabapentin 600 MG tablet Commonly known as: NEURONTIN TAKE ONE (1) TABLET THREE (3) TIMES EACH DAY What changed:  how much to take how to take this when to take this reasons to take this additional instructions   pantoprazole 40 MG tablet Commonly known as: PROTONIX TAKE ONE (1) TABLET EACH DAY   rosuvastatin 5 MG tablet Commonly known as: Crestor Take 1 tablet (5 mg total) by mouth daily. Takes one every other day What changed:  when to take this additional instructions   Semaglutide(0.25 or 0.'5MG'$ /DOS) 2 MG/3ML Sopn Inject 0.5 mg into the skin once a week.   Symbicort 160-4.5 MCG/ACT inhaler Generic drug: budesonide-formoterol USE 2 PUFFS TWICE DAILY         Objective:   BP (!) 106/59   Pulse 88   Temp 98 F (36.7 C)   Ht '5\' 10"'$  (1.778 m)   Wt 226 lb (102.5 kg)   SpO2 97%   BMI 32.43 kg/m   Wt Readings from Last 3 Encounters:  05/07/22 226 lb (102.5 kg)  05/05/22 225 lb (102.1 kg)  02/26/22 241 lb (109.3 kg)    Physical Exam Vitals and nursing note reviewed.  Constitutional:      General: She is not in acute distress.    Appearance: She is well-developed. She is not diaphoretic.  Eyes:     Conjunctiva/sclera: Conjunctivae normal.  Cardiovascular:     Rate and Rhythm: Normal rate and regular rhythm.     Heart sounds: Normal heart sounds. No  murmur heard. Pulmonary:     Effort: Pulmonary effort is normal. No respiratory distress.     Breath sounds: Normal breath sounds. No wheezing.  Musculoskeletal:        General: No swelling or tenderness. Normal range of motion.  Skin:    General: Skin is warm and dry.     Findings: No rash.  Neurological:  Mental Status: She is alert and oriented to person, place, and time.     Coordination: Coordination normal.  Psychiatric:        Behavior: Behavior normal.       Assessment & Plan:   Problem List Items Addressed This Visit       Respiratory   COPD with chronic bronchitis ([place])     Endocrine   Type 2 diabetes mellitus with other specified complication ([place])     Other   Mixed hyperlipidemia - Primary   Generalized anxiety disorder    A1c was 6.8 which looks great, she is losing weight and blood pressure looks good, everything going in the right direction. Follow up plan: Return in about 3 months (around 08/06/2022), or if symptoms worsen or fail to improve, for Diabetes and hyperlipidemia and COPD.  Counseling provided for all of the vaccine components No orders of the defined types were placed in this encounter.   Caryl Pina, MD [facility] 05/07/2022, 11:49 AM

## 2022-05-09 ENCOUNTER — Ambulatory Visit (INDEPENDENT_AMBULATORY_CARE_PROVIDER_SITE_OTHER): Payer: Medicare HMO | Admitting: *Deleted

## 2022-05-09 DIAGNOSIS — D518 Other vitamin B12 deficiency anemias: Secondary | ICD-10-CM | POA: Diagnosis not present

## 2022-05-09 DIAGNOSIS — E538 Deficiency of other specified B group vitamins: Secondary | ICD-10-CM

## 2022-05-09 NOTE — Progress Notes (Signed)
Pt in today for B12, given left deltoid, pt tolerated well.

## 2022-05-14 ENCOUNTER — Encounter (HOSPITAL_COMMUNITY)
Admission: RE | Disposition: A | Discharge status: Home / Self Care | Payer: Self-pay | Source: Home / Self Care | Attending: Orthopedic Surgery

## 2022-05-14 ENCOUNTER — Observation Stay (HOSPITAL_COMMUNITY)
Admission: RE | Admit: 2022-05-14 | Discharge: 2022-05-15 | Disposition: A | Discharge status: Home / Self Care | Payer: Medicare HMO | Attending: Orthopedic Surgery | Admitting: Orthopedic Surgery

## 2022-05-14 ENCOUNTER — Observation Stay (HOSPITAL_COMMUNITY): Payer: Medicare HMO

## 2022-05-14 ENCOUNTER — Other Ambulatory Visit: Payer: Self-pay

## 2022-05-14 ENCOUNTER — Ambulatory Visit (HOSPITAL_COMMUNITY): Payer: Medicare HMO | Admitting: Physician Assistant

## 2022-05-14 ENCOUNTER — Encounter (HOSPITAL_COMMUNITY): Payer: Self-pay | Admitting: Orthopedic Surgery

## 2022-05-14 ENCOUNTER — Ambulatory Visit (HOSPITAL_BASED_OUTPATIENT_CLINIC_OR_DEPARTMENT_OTHER): Payer: Medicare HMO | Admitting: Certified Registered"

## 2022-05-14 ENCOUNTER — Ambulatory Visit (HOSPITAL_COMMUNITY): Payer: Medicare HMO

## 2022-05-14 DIAGNOSIS — Z7984 Long term (current) use of oral hypoglycemic drugs: Secondary | ICD-10-CM | POA: Diagnosis not present

## 2022-05-14 DIAGNOSIS — M1611 Unilateral primary osteoarthritis, right hip: Secondary | ICD-10-CM

## 2022-05-14 DIAGNOSIS — J449 Chronic obstructive pulmonary disease, unspecified: Secondary | ICD-10-CM

## 2022-05-14 DIAGNOSIS — E119 Type 2 diabetes mellitus without complications: Secondary | ICD-10-CM

## 2022-05-14 DIAGNOSIS — Z79899 Other long term (current) drug therapy: Secondary | ICD-10-CM | POA: Insufficient documentation

## 2022-05-14 DIAGNOSIS — F419 Anxiety disorder, unspecified: Secondary | ICD-10-CM

## 2022-05-14 DIAGNOSIS — Z471 Aftercare following joint replacement surgery: Secondary | ICD-10-CM | POA: Diagnosis not present

## 2022-05-14 DIAGNOSIS — Z96653 Presence of artificial knee joint, bilateral: Secondary | ICD-10-CM | POA: Insufficient documentation

## 2022-05-14 DIAGNOSIS — Z96641 Presence of right artificial hip joint: Secondary | ICD-10-CM | POA: Diagnosis not present

## 2022-05-14 DIAGNOSIS — Z85828 Personal history of other malignant neoplasm of skin: Secondary | ICD-10-CM | POA: Diagnosis not present

## 2022-05-14 DIAGNOSIS — M1612 Unilateral primary osteoarthritis, left hip: Secondary | ICD-10-CM | POA: Diagnosis not present

## 2022-05-14 DIAGNOSIS — E1169 Type 2 diabetes mellitus with other specified complication: Secondary | ICD-10-CM

## 2022-05-14 DIAGNOSIS — Z7985 Long-term (current) use of injectable non-insulin antidiabetic drugs: Secondary | ICD-10-CM | POA: Diagnosis not present

## 2022-05-14 DIAGNOSIS — F17210 Nicotine dependence, cigarettes, uncomplicated: Secondary | ICD-10-CM

## 2022-05-14 DIAGNOSIS — Z01818 Encounter for other preprocedural examination: Secondary | ICD-10-CM

## 2022-05-14 DIAGNOSIS — M169 Osteoarthritis of hip, unspecified: Secondary | ICD-10-CM

## 2022-05-14 HISTORY — PX: TOTAL HIP ARTHROPLASTY: SHX124

## 2022-05-14 LAB — GLUCOSE, CAPILLARY
Glucose-Capillary: 148 mg/dL — ABNORMAL HIGH (ref 70–99)
Glucose-Capillary: 168 mg/dL — ABNORMAL HIGH (ref 70–99)
Glucose-Capillary: 211 mg/dL — ABNORMAL HIGH (ref 70–99)
Glucose-Capillary: 203 mg/dL — ABNORMAL HIGH (ref 70–99)

## 2022-05-14 LAB — TYPE AND SCREEN
ABO/RH(D): O POS
Antibody Screen: NEGATIVE

## 2022-05-14 SURGERY — ARTHROPLASTY, HIP, TOTAL, ANTERIOR APPROACH
Anesthesia: General | Site: Hip | Laterality: Right

## 2022-05-14 MED ORDER — METHOCARBAMOL 500 MG PO TABS
500.0000 mg | ORAL_TABLET | Freq: Four times a day (QID) | ORAL | Status: DC | PRN
  Administered 2022-05-14 – 2022-05-15 (×2): 500 mg via ORAL
  Filled 2022-05-14 (×2): qty 1

## 2022-05-14 MED ORDER — GABAPENTIN 300 MG PO CAPS
600.0000 mg | ORAL_CAPSULE | Freq: Three times a day (TID) | ORAL | Status: DC | PRN
  Administered 2022-05-14 – 2022-05-15 (×2): 600 mg via ORAL
  Filled 2022-05-14 (×2): qty 2

## 2022-05-14 MED ORDER — ORAL CARE MOUTH RINSE: 15.0000 mL | Freq: Once | OROMUCOSAL | Status: AC

## 2022-05-14 MED ORDER — SODIUM CHLORIDE 0.9 % IV SOLN: INTRAVENOUS | Status: DC

## 2022-05-14 MED ORDER — BUPIVACAINE-EPINEPHRINE (PF) 0.25% -1:200000 IJ SOLN
INTRAMUSCULAR | Status: AC
  Filled 2022-05-14: qty 30

## 2022-05-14 MED ORDER — POVIDONE-IODINE 10 % EX SWAB
2.0000 | Freq: Once | CUTANEOUS | Status: AC
  Administered 2022-05-14: 2 via TOPICAL

## 2022-05-14 MED ORDER — MOMETASONE FURO-FORMOTEROL FUM 200-5 MCG/ACT IN AERO
2.0000 | INHALATION_SPRAY | Freq: Two times a day (BID) | RESPIRATORY_TRACT | Status: DC
  Administered 2022-05-14 – 2022-05-15 (×2): 2 via RESPIRATORY_TRACT
  Filled 2022-05-14: qty 8.8

## 2022-05-14 MED ORDER — LIDOCAINE 2% (20 MG/ML) 5 ML SYRINGE
INTRAMUSCULAR | Status: DC | PRN
  Administered 2022-05-14: 60 mg via INTRAVENOUS

## 2022-05-14 MED ORDER — FENTANYL CITRATE (PF) 100 MCG/2ML IJ SOLN
INTRAMUSCULAR | Status: AC
  Filled 2022-05-14: qty 2

## 2022-05-14 MED ORDER — ONDANSETRON HCL 4 MG/2ML IJ SOLN
INTRAMUSCULAR | Status: AC
  Filled 2022-05-14: qty 2

## 2022-05-14 MED ORDER — ASPIRIN 325 MG PO TBEC
325.0000 mg | DELAYED_RELEASE_TABLET | Freq: Two times a day (BID) | ORAL | Status: DC
  Administered 2022-05-15: 325 mg via ORAL
  Filled 2022-05-14: qty 1

## 2022-05-14 MED ORDER — WATER FOR IRRIGATION, STERILE IR SOLN
Status: DC | PRN
  Administered 2022-05-14: 1500 mL

## 2022-05-14 MED ORDER — ROCURONIUM BROMIDE 10 MG/ML (PF) SYRINGE
PREFILLED_SYRINGE | INTRAVENOUS | Status: DC | PRN
  Administered 2022-05-14: 50 mg via INTRAVENOUS

## 2022-05-14 MED ORDER — FENTANYL CITRATE PF 50 MCG/ML IJ SOSY
PREFILLED_SYRINGE | INTRAMUSCULAR | Status: AC
  Filled 2022-05-14: qty 3

## 2022-05-14 MED ORDER — SCOPOLAMINE 1 MG/3DAYS TD PT72
MEDICATED_PATCH | TRANSDERMAL | Status: AC
  Filled 2022-05-14: qty 1

## 2022-05-14 MED ORDER — CEFAZOLIN SODIUM-DEXTROSE 2-4 GM/100ML-% IV SOLN
2.0000 g | INTRAVENOUS | Status: AC
  Administered 2022-05-14: 2 g via INTRAVENOUS
  Filled 2022-05-14: qty 100

## 2022-05-14 MED ORDER — OXYCODONE HCL 5 MG PO TABS
ORAL_TABLET | ORAL | Status: AC
  Filled 2022-05-14: qty 1

## 2022-05-14 MED ORDER — ROSUVASTATIN CALCIUM 5 MG PO TABS: 5.0000 mg | ORAL_TABLET | ORAL | Status: DC

## 2022-05-14 MED ORDER — ACETAMINOPHEN 325 MG PO TABS: 325.0000 mg | ORAL_TABLET | Freq: Four times a day (QID) | ORAL | Status: DC | PRN

## 2022-05-14 MED ORDER — ACETAMINOPHEN 10 MG/ML IV SOLN
1000.0000 mg | Freq: Once | INTRAVENOUS | Status: AC
  Administered 2022-05-14: 1000 mg via INTRAVENOUS
  Filled 2022-05-14: qty 100

## 2022-05-14 MED ORDER — ALBUTEROL SULFATE HFA 108 (90 BASE) MCG/ACT IN AERS: 2.0000 | INHALATION_SPRAY | Freq: Four times a day (QID) | RESPIRATORY_TRACT | Status: DC | PRN

## 2022-05-14 MED ORDER — ONDANSETRON HCL 4 MG/2ML IJ SOLN: 4.0000 mg | Freq: Four times a day (QID) | INTRAMUSCULAR | Status: DC | PRN

## 2022-05-14 MED ORDER — MORPHINE SULFATE (PF) 2 MG/ML IV SOLN: 0.5000 mg | INTRAVENOUS | Status: DC | PRN

## 2022-05-14 MED ORDER — SUGAMMADEX SODIUM 200 MG/2ML IV SOLN
INTRAVENOUS | Status: DC | PRN
  Administered 2022-05-14: 200 mg via INTRAVENOUS

## 2022-05-14 MED ORDER — EMPAGLIFLOZIN 25 MG PO TABS
25.0000 mg | ORAL_TABLET | Freq: Every day | ORAL | Status: DC
  Administered 2022-05-15: 25 mg via ORAL
  Filled 2022-05-14: qty 1

## 2022-05-14 MED ORDER — EPHEDRINE 5 MG/ML INJ
INTRAVENOUS | Status: AC
  Filled 2022-05-14: qty 5

## 2022-05-14 MED ORDER — METHOCARBAMOL 500 MG IVPB - SIMPLE MED
INTRAVENOUS | Status: AC
  Administered 2022-05-14: 500 mg via INTRAVENOUS
  Filled 2022-05-14: qty 55

## 2022-05-14 MED ORDER — METOCLOPRAMIDE HCL 5 MG PO TABS: 5.0000 mg | ORAL_TABLET | Freq: Three times a day (TID) | ORAL | Status: DC | PRN

## 2022-05-14 MED ORDER — MENTHOL 3 MG MT LOZG: 1.0000 | LOZENGE | OROMUCOSAL | Status: DC | PRN

## 2022-05-14 MED ORDER — ACETAMINOPHEN 10 MG/ML IV SOLN: 1000.0000 mg | Freq: Four times a day (QID) | INTRAVENOUS | Status: DC

## 2022-05-14 MED ORDER — DEXAMETHASONE SODIUM PHOSPHATE 10 MG/ML IJ SOLN
8.0000 mg | Freq: Once | INTRAMUSCULAR | Status: AC
  Administered 2022-05-14: 8 mg via INTRAVENOUS

## 2022-05-14 MED ORDER — SCOPOLAMINE 1 MG/3DAYS TD PT72
MEDICATED_PATCH | TRANSDERMAL | Status: DC | PRN
  Administered 2022-05-14: 1 via TRANSDERMAL

## 2022-05-14 MED ORDER — POLYETHYLENE GLYCOL 3350 17 G PO PACK: 17.0000 g | PACK | Freq: Every day | ORAL | Status: DC | PRN

## 2022-05-14 MED ORDER — DIPHENHYDRAMINE HCL 12.5 MG/5ML PO ELIX: 12.5000 mg | ORAL_SOLUTION | ORAL | Status: DC | PRN

## 2022-05-14 MED ORDER — CYCLOSPORINE 0.05 % OP EMUL
1.0000 [drp] | Freq: Two times a day (BID) | OPHTHALMIC | Status: DC
  Administered 2022-05-14 – 2022-05-15 (×2): 1 [drp] via OPHTHALMIC
  Filled 2022-05-14 (×2): qty 30

## 2022-05-14 MED ORDER — ROCURONIUM BROMIDE 10 MG/ML (PF) SYRINGE
PREFILLED_SYRINGE | INTRAVENOUS | Status: AC
  Filled 2022-05-14: qty 10

## 2022-05-14 MED ORDER — BISACODYL 10 MG RE SUPP: 10.0000 mg | Freq: Every day | RECTAL | Status: DC | PRN

## 2022-05-14 MED ORDER — CITALOPRAM HYDROBROMIDE 20 MG PO TABS
40.0000 mg | ORAL_TABLET | Freq: Every day | ORAL | Status: DC
  Administered 2022-05-15: 40 mg via ORAL
  Filled 2022-05-14: qty 2

## 2022-05-14 MED ORDER — INSULIN ASPART 100 UNIT/ML IJ SOLN
0.0000 [IU] | Freq: Every day | INTRAMUSCULAR | Status: DC
  Administered 2022-05-14: 2 [IU] via SUBCUTANEOUS

## 2022-05-14 MED ORDER — 0.9 % SODIUM CHLORIDE (POUR BTL) OPTIME
TOPICAL | Status: DC | PRN
  Administered 2022-05-14: 1000 mL

## 2022-05-14 MED ORDER — FLEET ENEMA 7-19 GM/118ML RE ENEM: 1.0000 | ENEMA | Freq: Once | RECTAL | Status: DC | PRN

## 2022-05-14 MED ORDER — PROPOFOL 10 MG/ML IV BOLUS
INTRAVENOUS | Status: AC
  Filled 2022-05-14: qty 20

## 2022-05-14 MED ORDER — METOCLOPRAMIDE HCL 5 MG/ML IJ SOLN: 5.0000 mg | Freq: Three times a day (TID) | INTRAMUSCULAR | Status: DC | PRN

## 2022-05-14 MED ORDER — OXYCODONE HCL 5 MG PO TABS
5.0000 mg | ORAL_TABLET | Freq: Once | ORAL | Status: AC | PRN
  Administered 2022-05-14: 5 mg via ORAL

## 2022-05-14 MED ORDER — LIDOCAINE HCL (PF) 2 % IJ SOLN
INTRAMUSCULAR | Status: AC
  Filled 2022-05-14: qty 5

## 2022-05-14 MED ORDER — PANTOPRAZOLE SODIUM 40 MG PO TBEC
40.0000 mg | DELAYED_RELEASE_TABLET | Freq: Every day | ORAL | Status: DC
  Administered 2022-05-15: 40 mg via ORAL
  Filled 2022-05-14: qty 1

## 2022-05-14 MED ORDER — FENTANYL CITRATE PF 50 MCG/ML IJ SOSY
25.0000 ug | PREFILLED_SYRINGE | INTRAMUSCULAR | Status: DC | PRN
  Administered 2022-05-14: 50 ug via INTRAVENOUS

## 2022-05-14 MED ORDER — MIDAZOLAM HCL 2 MG/2ML IJ SOLN
INTRAMUSCULAR | Status: DC | PRN
  Administered 2022-05-14: 2 mg via INTRAVENOUS

## 2022-05-14 MED ORDER — MIDAZOLAM HCL 2 MG/2ML IJ SOLN
INTRAMUSCULAR | Status: AC
  Filled 2022-05-14: qty 2

## 2022-05-14 MED ORDER — ALBUTEROL SULFATE (2.5 MG/3ML) 0.083% IN NEBU: 2.5000 mg | INHALATION_SOLUTION | Freq: Four times a day (QID) | RESPIRATORY_TRACT | Status: DC | PRN

## 2022-05-14 MED ORDER — TRANEXAMIC ACID-NACL 1000-0.7 MG/100ML-% IV SOLN
1000.0000 mg | INTRAVENOUS | Status: AC
  Administered 2022-05-14: 1000 mg via INTRAVENOUS
  Filled 2022-05-14: qty 100

## 2022-05-14 MED ORDER — LACTATED RINGERS IV SOLN: INTRAVENOUS | Status: DC

## 2022-05-14 MED ORDER — DEXAMETHASONE SODIUM PHOSPHATE 10 MG/ML IJ SOLN
INTRAMUSCULAR | Status: AC
  Filled 2022-05-14: qty 1

## 2022-05-14 MED ORDER — CHLORHEXIDINE GLUCONATE 0.12 % MT SOLN
15.0000 mL | Freq: Once | OROMUCOSAL | Status: AC
  Administered 2022-05-14: 15 mL via OROMUCOSAL

## 2022-05-14 MED ORDER — EPHEDRINE SULFATE-NACL 50-0.9 MG/10ML-% IV SOSY
PREFILLED_SYRINGE | INTRAVENOUS | Status: DC | PRN
  Administered 2022-05-14 (×2): 10 mg via INTRAVENOUS

## 2022-05-14 MED ORDER — FENTANYL CITRATE (PF) 100 MCG/2ML IJ SOLN
INTRAMUSCULAR | Status: DC | PRN
  Administered 2022-05-14: 50 ug via INTRAVENOUS

## 2022-05-14 MED ORDER — OXYCODONE HCL 5 MG/5ML PO SOLN: 5.0000 mg | Freq: Once | ORAL | Status: AC | PRN

## 2022-05-14 MED ORDER — BUPIVACAINE-EPINEPHRINE (PF) 0.25% -1:200000 IJ SOLN
INTRAMUSCULAR | Status: DC | PRN
  Administered 2022-05-14: 30 mL

## 2022-05-14 MED ORDER — ORAL CARE MOUTH RINSE: 15.0000 mL | OROMUCOSAL | Status: DC | PRN

## 2022-05-14 MED ORDER — ONDANSETRON HCL 4 MG/2ML IJ SOLN
INTRAMUSCULAR | Status: DC | PRN
  Administered 2022-05-14: 4 mg via INTRAVENOUS

## 2022-05-14 MED ORDER — METHOCARBAMOL 500 MG IVPB - SIMPLE MED: 500.0000 mg | Freq: Four times a day (QID) | INTRAVENOUS | Status: DC | PRN

## 2022-05-14 MED ORDER — HYDROCODONE-ACETAMINOPHEN 7.5-325 MG PO TABS: 1.0000 | ORAL_TABLET | ORAL | Status: DC | PRN

## 2022-05-14 MED ORDER — DOCUSATE SODIUM 100 MG PO CAPS
100.0000 mg | ORAL_CAPSULE | Freq: Two times a day (BID) | ORAL | Status: DC
  Administered 2022-05-14 – 2022-05-15 (×3): 100 mg via ORAL
  Filled 2022-05-14 (×3): qty 1

## 2022-05-14 MED ORDER — PHENOL 1.4 % MT LIQD: 1.0000 | OROMUCOSAL | Status: DC | PRN

## 2022-05-14 MED ORDER — INSULIN ASPART 100 UNIT/ML IJ SOLN
0.0000 [IU] | Freq: Three times a day (TID) | INTRAMUSCULAR | Status: DC
  Administered 2022-05-14: 5 [IU] via SUBCUTANEOUS
  Administered 2022-05-15: 2 [IU] via SUBCUTANEOUS

## 2022-05-14 MED ORDER — ONDANSETRON HCL 4 MG PO TABS: 4.0000 mg | ORAL_TABLET | Freq: Four times a day (QID) | ORAL | Status: DC | PRN

## 2022-05-14 MED ORDER — HYDROCODONE-ACETAMINOPHEN 5-325 MG PO TABS
1.0000 | ORAL_TABLET | ORAL | Status: DC | PRN
  Administered 2022-05-14: 1 via ORAL
  Administered 2022-05-14: 2 via ORAL
  Administered 2022-05-15: 1 via ORAL
  Administered 2022-05-15: 2 via ORAL
  Filled 2022-05-14 (×2): qty 2
  Filled 2022-05-14: qty 1
  Filled 2022-05-14: qty 2

## 2022-05-14 MED ORDER — EZETIMIBE 10 MG PO TABS
10.0000 mg | ORAL_TABLET | Freq: Every day | ORAL | Status: DC
  Filled 2022-05-14: qty 1

## 2022-05-14 MED ORDER — PROPOFOL 500 MG/50ML IV EMUL
INTRAVENOUS | Status: DC | PRN
  Administered 2022-05-14: 25 ug/kg/min via INTRAVENOUS

## 2022-05-14 MED ORDER — CEFAZOLIN SODIUM-DEXTROSE 2-4 GM/100ML-% IV SOLN
2.0000 g | Freq: Four times a day (QID) | INTRAVENOUS | Status: AC
  Administered 2022-05-14 (×2): 2 g via INTRAVENOUS
  Filled 2022-05-14 (×2): qty 100

## 2022-05-14 MED ORDER — PROPOFOL 10 MG/ML IV BOLUS
INTRAVENOUS | Status: DC | PRN
  Administered 2022-05-14: 180 mg via INTRAVENOUS

## 2022-05-14 SURGICAL SUPPLY — 43 items
BAG COUNTER SPONGE SURGICOUNT (BAG) IMPLANT
BAG DECANTER FOR FLEXI CONT (MISCELLANEOUS) IMPLANT
BAG SPEC THK2 15X12 ZIP CLS (MISCELLANEOUS)
BAG SPNG CNTER NS LX DISP (BAG) ×1
BAG ZIPLOCK 12X15 (MISCELLANEOUS) IMPLANT
BLADE SAG 18X100X1.27 (BLADE) ×2 IMPLANT
COVER PERINEAL POST (MISCELLANEOUS) ×2 IMPLANT
COVER SURGICAL LIGHT HANDLE (MISCELLANEOUS) ×2 IMPLANT
CUP ACETBLR 48 OD SECTOR II (Hips) IMPLANT
DRAPE FOOT SWITCH (DRAPES) ×2 IMPLANT
DRAPE STERI IOBAN 125X83 (DRAPES) ×2 IMPLANT
DRAPE U-SHAPE 47X51 STRL (DRAPES) ×4 IMPLANT
DRSG AQUACEL AG ADV 3.5X10 (GAUZE/BANDAGES/DRESSINGS) ×2 IMPLANT
DURAPREP 26ML APPLICATOR (WOUND CARE) ×2 IMPLANT
ELECT REM PT RETURN 15FT ADLT (MISCELLANEOUS) ×2 IMPLANT
GLOVE BIO SURGEON STRL SZ 6.5 (GLOVE) IMPLANT
GLOVE BIO SURGEON STRL SZ7.5 (GLOVE) IMPLANT
GLOVE BIO SURGEON STRL SZ8 (GLOVE) ×2 IMPLANT
GLOVE BIOGEL PI IND STRL 6.5 (GLOVE) IMPLANT
GLOVE BIOGEL PI IND STRL 7.0 (GLOVE) IMPLANT
GLOVE BIOGEL PI IND STRL 8 (GLOVE) ×2 IMPLANT
GOWN STRL REUS W/ TWL LRG LVL3 (GOWN DISPOSABLE) ×2 IMPLANT
GOWN STRL REUS W/ TWL XL LVL3 (GOWN DISPOSABLE) IMPLANT
GOWN STRL REUS W/TWL LRG LVL3 (GOWN DISPOSABLE) ×1
GOWN STRL REUS W/TWL XL LVL3 (GOWN DISPOSABLE)
HEAD CERAMIC DELTA 28 P1.5 HIP (Head) IMPLANT
HOLDER FOLEY CATH W/STRAP (MISCELLANEOUS) ×2 IMPLANT
KIT TURNOVER KIT A (KITS) IMPLANT
LINER MARATHON 28 48 (Hips) IMPLANT
MANIFOLD NEPTUNE II (INSTRUMENTS) ×2 IMPLANT
PACK ANTERIOR HIP CUSTOM (KITS) ×2 IMPLANT
PENCIL SMOKE EVACUATOR COATED (MISCELLANEOUS) ×2 IMPLANT
SPIKE FLUID TRANSFER (MISCELLANEOUS) ×2 IMPLANT
STEM FEMORAL SZ 6MM STD ACTIS (Stem) IMPLANT
STRIP CLOSURE SKIN 1/2X4 (GAUZE/BANDAGES/DRESSINGS) ×2 IMPLANT
SUT ETHIBOND NAB CT1 #1 30IN (SUTURE) ×2 IMPLANT
SUT MNCRL AB 4-0 PS2 18 (SUTURE) ×2 IMPLANT
SUT STRATAFIX 0 PDS 27 VIOLET (SUTURE) ×1
SUT VIC AB 2-0 CT1 27 (SUTURE) ×2
SUT VIC AB 2-0 CT1 TAPERPNT 27 (SUTURE) ×4 IMPLANT
SUTURE STRATFX 0 PDS 27 VIOLET (SUTURE) ×2 IMPLANT
TRAY FOLEY MTR SLVR 16FR STAT (SET/KITS/TRAYS/PACK) ×2 IMPLANT
TUBE SUCTION HIGH CAP CLEAR NV (SUCTIONS) ×2 IMPLANT

## 2022-05-14 NOTE — Discharge Instructions (Signed)
°Frank Aluisio, MD °Total Joint Specialist °[facility] °[address] °, [place] [address] °[phone] ° °ANTERIOR APPROACH TOTAL HIP REPLACEMENT POSTOPERATIVE DIRECTIONS ° ° ° ° °Hip Rehabilitation, Guidelines Following Surgery  °The results of a hip operation are greatly improved after range of motion and muscle strengthening exercises. Follow all safety measures which are given to protect your hip. If any of these exercises cause increased pain or swelling in your joint, decrease the amount until you are comfortable again. Then slowly increase the exercises. Call your caregiver if you have problems or questions.  ° °BLOOD CLOT PREVENTION °Take a 325 mg Aspirin two times a day for three weeks following surgery. Then take an 81 mg Aspirin once a day for three weeks. Then discontinue Aspirin. °You may resume your vitamins/supplements upon discharge from the hospital. °Do not take any NSAIDs (Advil, Aleve, Ibuprofen, Meloxicam, etc.) until you have discontinued the 325 mg Aspirin. ° °HOME CARE INSTRUCTIONS  °Remove items at home which could result in a fall. This includes throw rugs or furniture in walking pathways.  °ICE to the affected hip as frequently as 20-30 minutes an hour and then as needed for pain and swelling. Continue to use ice on the hip for pain and swelling from surgery. You may notice swelling that will progress down to the foot and ankle. This is normal after surgery. Elevate the leg when you are not up walking on it.   °Continue to use the breathing machine which will help keep your temperature down.  It is common for your temperature to cycle up and down following surgery, especially at night when you are not up moving around and exerting yourself.  The breathing machine keeps your lungs expanded and your temperature down. ° °DIET °You may resume your previous home diet once your are discharged from the hospital. ° °DRESSING / WOUND CARE / SHOWERING °You have  an adhesive waterproof bandage over the incision. Leave this in place until your first follow-up appointment. Once you remove this you will not need to place another bandage.  °You may begin showering 3 days following surgery, but do not submerge the incision under water. ° °ACTIVITY °For the first 3-5 days, it is important to rest and keep the operative leg elevated. You should, as a general rule, rest for 50 minutes and walk/stretch for 10 minutes per hour. After 5 days, you may slowly increase activity as tolerated.  °Perform the exercises you were provided twice a day for about 15-20 minutes each session. Begin these 2 days following surgery. °Walk with your walker as instructed. Use the walker until you are comfortable transitioning to a cane. Walk with the cane in the opposite hand of the operative leg. You may discontinue the cane once you are comfortable and walking steadily. °Avoid periods of inactivity such as sitting longer than an hour when not asleep. This helps prevent blood clots.  °Do not drive a car for 6 weeks or until released by your surgeon.  °Do not drive while taking narcotics. ° °TED HOSE STOCKINGS °Wear the elastic stockings on both legs for three weeks following surgery during the day. You may remove them at night while sleeping. ° °WEIGHT BEARING °Weight bearing as tolerated with assist device (walker, cane, etc) as directed, use it as long as suggested by your surgeon or therapist, typically at least 4-6 weeks. ° °POSTOPERATIVE CONSTIPATION PROTOCOL °Constipation - defined medically as fewer than three stools per week and severe constipation as   less than one stool per week. ° °One of the most common issues patients have following surgery is constipation.  Even if you have a regular bowel pattern at home, your normal regimen is likely to be disrupted due to multiple reasons following surgery.  Combination of anesthesia, postoperative narcotics, change in appetite and fluid intake all can  affect your bowels.  In order to avoid complications following surgery, here are some recommendations in order to help you during your recovery period. ° °Colace (docusate) - Pick up an over-the-counter form of Colace or another stool softener and take twice a day as long as you are requiring postoperative pain medications.  Take with a full glass of water daily.  If you experience loose stools or diarrhea, hold the colace until you stool forms back up.  If your symptoms do not get better within 1 week or if they get worse, check with your doctor. °Dulcolax (bisacodyl) - Pick up over-the-counter and take as directed by the product packaging as needed to assist with the movement of your bowels.  Take with a full glass of water.  Use this product as needed if not relieved by Colace only.  °MiraLax (polyethylene glycol) - Pick up over-the-counter to have on hand.  MiraLax is a solution that will increase the amount of water in your bowels to assist with bowel movements.  Take as directed and can mix with a glass of water, juice, soda, coffee, or tea.  Take if you go more than two days without a movement.Do not use MiraLax more than once per day. Call your doctor if you are still constipated or irregular after using this medication for 7 days in a row. ° °If you continue to have problems with postoperative constipation, please contact the office for further assistance and recommendations.  If you experience "the worst abdominal pain ever" or develop nausea or vomiting, please contact the office immediatly for further recommendations for treatment. ° °ITCHING ° If you experience itching with your medications, try taking only a single pain pill, or even half a pain pill at a time.  You can also use Benadryl over the counter for itching or also to help with sleep.  ° °MEDICATIONS °See your medication summary on the “After Visit Summary” that the nursing staff will review with you prior to discharge.  You may have some home  medications which will be placed on hold until you complete the course of blood thinner medication.  It is important for you to complete the blood thinner medication as prescribed by your surgeon.  Continue your approved medications as instructed at time of discharge. ° °PRECAUTIONS °If you experience chest pain or shortness of breath - call 911 immediately for transfer to the hospital emergency department.  °If you develop a fever greater that 101 F, purulent drainage from wound, increased redness or drainage from wound, foul odor from the wound/dressing, or calf pain - CONTACT YOUR SURGEON.   °                                                °FOLLOW-UP APPOINTMENTS °Make sure you keep all of your appointments after your operation with your surgeon and caregivers. You should call the office at the above phone number and make an appointment for approximately two weeks after the date of your surgery or on the   date instructed by your surgeon outlined in the "After Visit Summary". ° °RANGE OF MOTION AND STRENGTHENING EXERCISES  °These exercises are designed to help you keep full movement of your hip joint. Follow your caregiver's or physical therapist's instructions. Perform all exercises about fifteen times, three times per day or as directed. Exercise both hips, even if you have had only one joint replacement. These exercises can be done on a training (exercise) mat, on the floor, on a table or on a bed. Use whatever works the best and is most comfortable for you. Use music or television while you are exercising so that the exercises are a pleasant break in your day. This will make your life better with the exercises acting as a break in routine you can look forward to.  °Lying on your back, slowly slide your foot toward your buttocks, raising your knee up off the floor. Then slowly slide your foot back down until your leg is straight again.  °Lying on your back spread your legs as far apart as you can without causing  discomfort.  °Lying on your side, raise your upper leg and foot straight up from the floor as far as is comfortable. Slowly lower the leg and repeat.  °Lying on your back, tighten up the muscle in the front of your thigh (quadriceps muscles). You can do this by keeping your leg straight and trying to raise your heel off the floor. This helps strengthen the largest muscle supporting your knee.  °Lying on your back, tighten up the muscles of your buttocks both with the legs straight and with the knee bent at a comfortable angle while keeping your heel on the floor.  ° °POST-OPERATIVE OPIOID TAPER INSTRUCTIONS: °It is important to wean off of your opioid medication as soon as possible. If you do not need pain medication after your surgery it is ok to stop day one. °Opioids include: °Codeine, Hydrocodone(Norco, Vicodin), Oxycodone(Percocet, oxycontin) and hydromorphone amongst others.  °Long term and even short term use of opiods can cause: °Increased pain response °Dependence °Constipation °Depression °Respiratory depression °And more.  °Withdrawal symptoms can include °Flu like symptoms °Nausea, vomiting °And more °Techniques to manage these symptoms °Hydrate well °Eat regular healthy meals °Stay active °Use relaxation techniques(deep breathing, meditating, yoga) °Do Not substitute Alcohol to help with tapering °If you have been on opioids for less than two weeks and do not have pain than it is ok to stop all together.  °Plan to wean off of opioids °This plan should start within one week post op of your joint replacement. °Maintain the same interval or time between taking each dose and first decrease the dose.  °Cut the total daily intake of opioids by one tablet each day °Next start to increase the time between doses. °The last dose that should be eliminated is the evening dose.  ° °IF YOU ARE TRANSFERRED TO A SKILLED REHAB FACILITY °If the patient is transferred to a skilled rehab facility following release from the  hospital, a list of the current medications will be sent to the facility for the patient to continue.  When discharged from the skilled rehab facility, please have the facility set up the patient's Home Health Physical Therapy prior to being released. Also, the skilled facility will be responsible for providing the patient with their medications at time of release from the facility to include their pain medication, the muscle relaxants, and their blood thinner medication. If the patient is still at the rehab facility   at time of the two week follow up appointment, the skilled rehab facility will also need to assist the patient in arranging follow up appointment in our office and any transportation needs. ° °MAKE SURE YOU:  °Understand these instructions.  °Get help right away if you are not doing well or get worse.  ° ° °DENTAL ANTIBIOTICS: ° °In most cases prophylactic antibiotics for Dental procdeures after total joint surgery are not necessary. ° °Exceptions are as follows: ° °1. History of prior total joint infection ° °2. Severely immunocompromised (Organ Transplant, cancer chemotherapy, Rheumatoid biologic °meds such as Humera) ° °3. Poorly controlled diabetes (A1C &gt; 8.0, blood glucose over 200) ° °If you have one of these conditions, contact your surgeon for an antibiotic prescription, prior to your °dental procedure.  ° ° °Pick up stool softner and laxative for home use following surgery while on pain medications. °Do not submerge incision under water. °Please use good hand washing techniques while changing dressing each day. °May shower starting three days after surgery. °Please use a clean towel to pat the incision dry following showers. °Continue to use ice for pain and swelling after surgery. °Do not use any lotions or creams on the incision until instructed by your surgeon. ° °

## 2022-05-14 NOTE — Evaluation (Signed)
Physical Therapy Evaluation Patient Details Name: [name] MRN: [id number] DOB: Jul 24, 1956 Today's Date: 05/14/2022  History of Present Illness  66 yo female S/P R DA THA, PMH: MS, L UKR, revision, COPd.  Clinical Impression  The patient has been up to Br already. The patient ambulated x 100' with Rw. Patient eager to Dc home tomorrow.  Pt admitted with above diagnosis.   Pt currently with functional limitations due to the deficits listed below (see PT Problem List). Pt will benefit from skilled PT to increase their independence and safety with mobility to allow discharge to the venue listed below.          Recommendations for follow up therapy are one component of a multi-disciplinary discharge planning process, led by the attending physician.  Recommendations may be updated based on patient status, additional functional criteria and insurance authorization.  Follow Up Recommendations Follow physician's recommendations for discharge plan and follow up therapies      Assistance Recommended at Discharge Set up Supervision/Assistance  Patient can return home with the following  Help with stairs or ramp for entrance;A little help with bathing/dressing/bathroom;Assistance with cooking/housework;Assist for transportation    Equipment Recommendations None recommended by PT  Recommendations for Other Services       Functional Status Assessment Patient has had a recent decline in their functional status and demonstrates the ability to make significant improvements in function in a reasonable and predictable amount of time.     Precautions / Restrictions Precautions Precautions: Fall Restrictions Weight Bearing Restrictions: No      Mobility  Bed Mobility Overal bed mobility: Needs Assistance Bed Mobility: Supine to Sit, Sit to Supine     Supine to sit: Min guard Sit to supine: Min assist   General bed mobility comments: assist with right leg to place onto bed     Transfers Overall transfer level: Needs assistance Equipment used: Rolling walker (2 wheels) Transfers: Sit to/from Stand Sit to Stand: Supervision                Ambulation/Gait Ambulation/Gait assistance: Min guard Gait Distance (Feet): 100 Feet Assistive device: Rolling walker (2 wheels) Gait Pattern/deviations: Step-to pattern, Step-through pattern       General Gait Details: barely antalgic  Stairs            Wheelchair Mobility    Modified Rankin (Stroke Patients Only)       Balance Overall balance assessment: No apparent balance deficits (not formally assessed)                                           Pertinent Vitals/Pain Pain Assessment Pain Assessment: 0-10 Pain Score: 6  Pain Location: rightnhip Pain Descriptors / Indicators: Discomfort, Aching Pain Intervention(s): Monitored during session, Premedicated before session, Ice applied    Home Living Family/patient expects to be discharged to:: Private residence Living Arrangements: Spouse/significant other Available Help at Discharge: Family;Available 24 hours/day Type of Home: House Home Access: Stairs to enter Entrance Stairs-Rails: None Entrance Stairs-Number of Steps: 2   Home Layout: Two level;Able to live on main level with bedroom/bathroom Home Equipment: Rolling Walker (2 wheels);BSC/3in1      Prior Function Prior Level of Function : Independent/Modified Independent                     Hand Dominance   Dominant Hand: Right  Extremity/Trunk Assessment   Upper Extremity Assessment Upper Extremity Assessment: Overall WFL for tasks assessed         Cervical / Trunk Assessment Cervical / Trunk Assessment: Normal  Communication   Communication: No difficulties  Cognition Arousal/Alertness: Awake/alert Behavior During Therapy: WFL for tasks assessed/performed Overall Cognitive Status: Within Functional Limits for tasks assessed                                           General Comments      Exercises     Assessment/Plan    PT Assessment Patient needs continued PT services  PT Problem List Decreased strength;Decreased safety awareness;Decreased range of motion;Decreased knowledge of precautions;Decreased activity tolerance;Decreased balance;Decreased knowledge of use of DME;Pain       PT Treatment Interventions DME instruction;Therapeutic activities;Gait training;Therapeutic exercise;Patient/family education;Stair training;Functional mobility training    PT Goals (Current goals can be found in the Care Plan section)  Acute Rehab PT Goals Patient Stated Goal: to go home PT Goal Formulation: With patient Time For Goal Achievement: 05/21/22 Potential to Achieve Goals: Good    Frequency 7X/week     Co-evaluation               AM-PAC PT "6 Clicks" Mobility  Outcome Measure Help needed turning from your back to your side while in a flat bed without using bedrails?: A Little Help needed moving from lying on your back to sitting on the side of a flat bed without using bedrails?: A Little Help needed moving to and from a bed to a chair (including a wheelchair)?: A Little Help needed standing up from a chair using your arms (e.g., wheelchair or bedside chair)?: A Little Help needed to walk in hospital room?: A Little Help needed climbing 3-5 steps with a railing? : A Little 6 Click Score: 18    End of Session Equipment Utilized During Treatment: Gait belt Activity Tolerance: Patient tolerated treatment well Patient left: in bed;with call bell/phone within reach;with bed alarm set Nurse Communication: Mobility status PT Visit Diagnosis: Unsteadiness on feet (R26.81);Pain;Difficulty in walking, not elsewhere classified (R26.2) Pain - Right/Left: Right Pain - part of body: Hip    Time: 9371-6967 PT Time Calculation (min) (ACUTE ONLY): 24 min   Charges:   PT Evaluation $PT Eval Low  Complexity: 1 Low PT Treatments $Gait Training: 8-22 mins        [place] Office [phone] Weekend [phone]   Claretha Cooper 05/14/2022, 4:29 PM

## 2022-05-14 NOTE — Op Note (Signed)
OPERATIVE REPORT- TOTAL HIP ARTHROPLASTY   PREOPERATIVE DIAGNOSIS: Osteoarthritis of the Right hip.   POSTOPERATIVE DIAGNOSIS: Osteoarthritis of the Right  hip.   PROCEDURE: Right total hip arthroplasty, anterior approach.   SURGEON: Gaynelle Arabian, MD   ASSISTANT: Theresa Duty, PA-C  ANESTHESIA:  General  ESTIMATED BLOOD LOSS:-350 mL    DRAINS: None  COMPLICATIONS: None   CONDITION: PACU - hemodynamically stable.   BRIEF CLINICAL NOTE: [name] is a 66 y.o. female who has advanced end-  stage arthritis of their Right  hip with progressively worsening pain and  dysfunction.The patient has failed nonoperative management and presents for  total hip arthroplasty.   PROCEDURE IN DETAIL: After successful administration of spinal  anesthetic, the traction boots for the [facility] bed were placed on both  feet and the patient was placed onto the [facility] bed, boots placed into the leg  holders. The Right hip was then isolated from the perineum with plastic  drapes and prepped and draped in the usual sterile fashion. ASIS and  greater trochanter were marked and a oblique incision was made, starting  at about 1 cm lateral and 2 cm distal to the ASIS and coursing towards  the anterior cortex of the femur. The skin was cut with a 10 blade  through subcutaneous tissue to the level of the fascia overlying the  tensor fascia lata muscle. The fascia was then incised in line with the  incision at the junction of the anterior third and posterior 2/3rd. The  muscle was teased off the fascia and then the interval between the TFL  and the rectus was developed. The Hohmann retractor was then placed at  the top of the femoral neck over the capsule. The vessels overlying the  capsule were cauterized and the fat on top of the capsule was removed.  A Hohmann retractor was then placed anterior underneath the rectus  femoris to give exposure to the entire anterior capsule. A T-shaped   capsulotomy was performed. The edges were tagged and the femoral head  was identified.       Osteophytes are removed off the superior acetabulum.  The femoral neck was then cut in situ with an oscillating saw. Traction  was then applied to the left lower extremity utilizing the [facility]  traction. The femoral head was then removed. Retractors were placed  around the acetabulum and then circumferential removal of the labrum was  performed. Osteophytes were also removed. Reaming starts at 45 mm to  medialize and  Increased in 2 mm increments to 47 mm. We reamed in  approximately 40 degrees of abduction, 20 degrees anteversion. A 48 mm  pinnacle acetabular shell was then impacted in anatomic position under  fluoroscopic guidance with excellent purchase. We did not need to place  any additional dome screws. A 28 mm neutral + 4 marathon liner was then  placed into the acetabular shell.       The femoral lift was then placed along the lateral aspect of the femur  just distal to the vastus ridge. The leg was  externally rotated and capsule  was stripped off the inferior aspect of the femoral neck down to the  level of the lesser trochanter, this was done with electrocautery. The femur was lifted after this was performed. The  leg was then placed in an extended and adducted position essentially delivering the femur. We also removed the capsule superiorly and the piriformis from the piriformis fossa to  gain excellent exposure of the  proximal femur. Rongeur was used to remove some cancellous bone to get  into the lateral portion of the proximal femur for placement of the  initial starter reamer. The starter broaches was placed  the starter broach  and was shown to go down the center of the canal. Broaching  with the Actis system was then performed starting at size 0  coursing  Up to size 6. A size 6 had excellent torsional and rotational  and axial stability. The trial standard offset neck was then  placed  with a 28 + 1.5 trial head. The hip was then reduced. We confirmed that  the stem was in the canal both on AP and lateral x-rays. It also has excellent sizing. The hip was reduced with outstanding stability through full extension and full external rotation.. AP pelvis was taken and the leg lengths were measured and found to be equal. Hip was then dislocated again and the femoral head and neck removed. The  femoral broach was removed. Size 6 Actis stem with a standard offset  neck was then impacted into the femur following native anteversion. Has  excellent purchase in the canal. Excellent torsional and rotational and  axial stability. It is confirmed to be in the canal on AP and lateral  fluoroscopic views. The 28 + 1.5 ceramic head was placed and the hip  reduced with outstanding stability. Again AP pelvis was taken and it  confirmed that the leg lengths were equal. The wound was then copiously  irrigated with saline solution and the capsule reattached and repaired  with Ethibond suture. 30 ml of .25% Bupivicaine was  injected into the capsule and into the edge of the tensor fascia lata as well as subcutaneous tissue. The fascia overlying the tensor fascia lata was then closed with a running #1 V-Loc. Subcu was closed with interrupted 2-0 Vicryl and subcuticular running 4-0 Monocryl. Incision was cleaned  and dried. Steri-Strips and a bulky sterile dressing applied. The patient was awakened and transported to  recovery in stable condition.        Please note that a surgical assistant was a medical necessity for this procedure to perform it in a safe and expeditious manner. Assistant was necessary to provide appropriate retraction of vital neurovascular structures and to prevent femoral fracture and allow for anatomic placement of the prosthesis.  Gaynelle Arabian, M.D.

## 2022-05-14 NOTE — Anesthesia Procedure Notes (Signed)
Procedure Name: Intubation Date/Time: 05/14/2022 8:11 AM  Performed by: Niel Hummer, CRNAPre-anesthesia Checklist: Patient identified, Emergency Drugs available, Suction available and Patient being monitored Patient Re-evaluated:Patient Re-evaluated prior to induction Oxygen Delivery Method: Circle system utilized Preoxygenation: Pre-oxygenation with 100% oxygen Induction Type: IV induction Ventilation: Mask ventilation without difficulty Laryngoscope Size: Mac and 4 Grade View: Grade I Tube type: Oral Tube size: 7.0 mm Number of attempts: 1 Airway Equipment and Method: Stylet Placement Confirmation: ETT inserted through vocal cords under direct vision, positive ETCO2 and breath sounds checked- equal and bilateral Secured at: 22 cm Tube secured with: Tape Dental Injury: Teeth and Oropharynx as per pre-operative assessment

## 2022-05-14 NOTE — Interval H&P Note (Signed)
History and Physical Interval Note:  05/14/2022 6:49 AM  [name]  has presented today for surgery, with the diagnosis of right hip osteoarthritis.  The various methods of treatment have been discussed with the patient and family. After consideration of risks, benefits and other options for treatment, the patient has consented to  Procedure(s): TOTAL HIP ARTHROPLASTY ANTERIOR APPROACH (Right) as a surgical intervention.  The patient's history has been reviewed, patient examined, no change in status, stable for surgery.  I have reviewed the patient's chart and labs.  Questions were answered to the patient's satisfaction.     [name]

## 2022-05-14 NOTE — Anesthesia Postprocedure Evaluation (Signed)
Anesthesia Post Note  Patient: [name]  Procedure(s) Performed: TOTAL HIP ARTHROPLASTY ANTERIOR APPROACH (Right: Hip)     Patient location during evaluation: PACU Anesthesia Type: General Level of consciousness: awake and alert Pain management: pain level controlled Vital Signs Assessment: post-procedure vital signs reviewed and stable Respiratory status: spontaneous breathing, nonlabored ventilation, respiratory function stable and patient connected to nasal cannula oxygen Cardiovascular status: blood pressure returned to baseline and stable Postop Assessment: no apparent nausea or vomiting Anesthetic complications: no   No notable events documented.  Last Vitals:  Vitals:   05/14/22 1245 05/14/22 1317  BP: 128/67 123/68  Pulse: 71 64  Resp: 20 16  Temp: 36.7 C 36.9 C  SpO2: 92% 93%    Last Pain:  Vitals:   05/14/22 1317  TempSrc: Oral  PainSc: [place]

## 2022-05-14 NOTE — Anesthesia Preprocedure Evaluation (Addendum)
Anesthesia Evaluation  Patient identified by MRN, date of birth, ID band Patient awake    Reviewed: Allergy & Precautions, H&P , NPO status , Patient's Chart, lab work & pertinent test results  History of Anesthesia Complications (+) PONV and history of anesthetic complications  Airway Mallampati: II   Neck ROM: full    Dental   Pulmonary COPD, Current Smoker and Patient abstained from smoking.,    breath sounds clear to auscultation       Cardiovascular negative cardio ROS   Rhythm:regular Rate:Normal     Neuro/Psych  Headaches, PSYCHIATRIC DISORDERS Anxiety Multiple sclerosis    GI/Hepatic GERD  ,  Endo/Other  diabetes, Type 2  Renal/GU      Musculoskeletal  (+) Arthritis ,   Abdominal   Peds  Hematology   Anesthesia Other Findings   Reproductive/Obstetrics                             Anesthesia Physical Anesthesia Plan  ASA: 3  Anesthesia Plan: General   Post-op Pain Management:    Induction: Intravenous  PONV Risk Score and Plan: 3 and Ondansetron, Propofol infusion, Treatment may vary due to age or medical condition, Midazolam, Scopolamine patch - Pre-op and Dexamethasone  Airway Management Planned: Oral ETT  Additional Equipment:   Intra-op Plan:   Post-operative Plan: Extubation in OR  Informed Consent: I have reviewed the patients History and Physical, chart, labs and discussed the procedure including the risks, benefits and alternatives for the proposed anesthesia with the patient or authorized representative who has indicated his/her understanding and acceptance.     Dental advisory given  Plan Discussed with: CRNA, Anesthesiologist and Surgeon  Anesthesia Plan Comments: (Pt declines option for spinal as she suffered a severe spinal headache following a previous spinal anesthetic.)       Anesthesia Quick Evaluation

## 2022-05-14 NOTE — Transfer of Care (Signed)
Immediate Anesthesia Transfer of Care Note  Patient: [name]  Procedure(s) Performed: TOTAL HIP ARTHROPLASTY ANTERIOR APPROACH (Right: Hip)  Patient Location: PACU  Anesthesia Type:General  Level of Consciousness: drowsy  Airway & Oxygen Therapy: Patient Spontanous Breathing and Patient connected to face mask oxygen  Post-op Assessment: Report given to RN, Post -op Vital signs reviewed and stable and Patient moving all extremities X 4  Post vital signs: Reviewed and stable  Last Vitals:  Vitals Value Taken Time  BP 132/62 05/14/22 0945  Temp    Pulse 70 05/14/22 0945  Resp 17 05/14/22 0945  SpO2 98 % 05/14/22 0945  Vitals shown include unvalidated device data.  Last Pain:  Vitals:   05/14/22 0639  TempSrc: Oral  PainSc:       Patients Stated Pain Goal: 4 (20/25/42 7062)  Complications: No notable events documented.

## 2022-05-15 ENCOUNTER — Encounter (HOSPITAL_COMMUNITY): Payer: Self-pay | Admitting: Orthopedic Surgery

## 2022-05-15 DIAGNOSIS — J449 Chronic obstructive pulmonary disease, unspecified: Secondary | ICD-10-CM | POA: Diagnosis not present

## 2022-05-15 DIAGNOSIS — F17210 Nicotine dependence, cigarettes, uncomplicated: Secondary | ICD-10-CM | POA: Diagnosis not present

## 2022-05-15 DIAGNOSIS — M1611 Unilateral primary osteoarthritis, right hip: Secondary | ICD-10-CM | POA: Diagnosis not present

## 2022-05-15 DIAGNOSIS — Z79899 Other long term (current) drug therapy: Secondary | ICD-10-CM | POA: Diagnosis not present

## 2022-05-15 DIAGNOSIS — Z7984 Long term (current) use of oral hypoglycemic drugs: Secondary | ICD-10-CM | POA: Diagnosis not present

## 2022-05-15 DIAGNOSIS — E119 Type 2 diabetes mellitus without complications: Secondary | ICD-10-CM | POA: Diagnosis not present

## 2022-05-15 DIAGNOSIS — Z85828 Personal history of other malignant neoplasm of skin: Secondary | ICD-10-CM | POA: Diagnosis not present

## 2022-05-15 DIAGNOSIS — Z96653 Presence of artificial knee joint, bilateral: Secondary | ICD-10-CM | POA: Diagnosis not present

## 2022-05-15 LAB — BASIC METABOLIC PANEL
Anion gap: 7 (ref 5–15)
BUN: 10 mg/dL (ref 8–23)
CO2: 26 mmol/L (ref 22–32)
Calcium: 8.2 mg/dL — ABNORMAL LOW (ref 8.9–10.3)
Chloride: 104 mmol/L (ref 98–111)
Creatinine, Ser: 0.7 mg/dL (ref 0.44–1.00)
GFR, Estimated: >60 mL/min (ref >60)
Glucose, Bld: 162 mg/dL — ABNORMAL HIGH (ref 70–99)
Potassium: 3.9 mmol/L (ref 3.5–5.1)
Sodium: 137 mmol/L (ref 135–145)

## 2022-05-15 LAB — CBC
HCT: 38.2 % (ref 36.0–46.0)
Hemoglobin: 11.7 g/dL — ABNORMAL LOW (ref 12.0–15.0)
MCH: 28.1 pg (ref 26.0–34.0)
MCHC: 30.6 g/dL (ref 30.0–36.0)
MCV: 91.8 fL (ref 80.0–100.0)
Platelets: 211 10*3/uL (ref 150–400)
RBC: 4.16 MIL/uL (ref 3.87–5.11)
RDW: 14.8 % (ref 11.5–15.5)
WBC: 17.8 10*3/uL — ABNORMAL HIGH (ref 4.0–10.5)
nRBC: 0 % (ref 0.0–0.2)

## 2022-05-15 LAB — GLUCOSE, CAPILLARY: Glucose-Capillary: 146 mg/dL — ABNORMAL HIGH (ref 70–99)

## 2022-05-15 MED ORDER — HYDROCODONE-ACETAMINOPHEN 5-325 MG PO TABS: 1.0000 | ORAL_TABLET | Freq: Four times a day (QID) | ORAL | 0 refills | Status: DC | PRN

## 2022-05-15 MED ORDER — METHOCARBAMOL 500 MG PO TABS: 500.0000 mg | ORAL_TABLET | Freq: Four times a day (QID) | ORAL | 0 refills | Status: DC | PRN

## 2022-05-15 MED ORDER — ASPIRIN 325 MG PO TBEC: 325.0000 mg | DELAYED_RELEASE_TABLET | Freq: Two times a day (BID) | ORAL | 0 refills | Status: AC

## 2022-05-15 NOTE — Progress Notes (Signed)
   Subjective: 1 Day Post-Op Procedure(s) (LRB): TOTAL HIP ARTHROPLASTY ANTERIOR APPROACH (Right) Patient seen in rounds by Dr. Wynelle Link. Patient is well, and has had no acute complaints or problems. Denies SOB or chest pain. Denies calf pain. Patient reports pain as mild.  Worked with physical therapy yesterday and ambulated 100'. Highly motivated to go home.  Objective: Vital signs in last 24 hours: Temp:  [97.6 F (36.4 C)-99.1 F (37.3 C)] 98.8 F (37.1 C) (10/05 0418) Pulse Rate:  [60-77] 60 (10/05 0418) Resp:  [10-20] 18 (10/05 0418) BP: (119-144)/(58-85) 128/61 (10/05 0418) SpO2:  [90 %-100 %] 94 % (10/05 0418)  Intake/Output from previous day:  Intake/Output Summary (Last 24 hours) at 05/15/2022 0738 Last data filed at 05/15/2022 0643 Gross per 24 hour  Intake 4268.62 ml  Output 2500 ml  Net 1768.62 ml     Intake/Output this shift: No intake/output data recorded.  Labs: Recent Labs    05/15/22 0358  HGB 11.7*   Recent Labs    05/15/22 0358  WBC 17.8*  RBC 4.16  HCT 38.2  PLT 211   Recent Labs    05/15/22 0358  NA 137  K 3.9  CL 104  CO2 26  BUN 10  CREATININE 0.70  GLUCOSE 162*  CALCIUM 8.2*   No results for input(s): "LABPT", "INR" in the last 72 hours.  Exam: General - Patient is Alert and Oriented Extremity - Neurologically intact Neurovascular intact Sensation intact distally Dorsiflexion/Plantar flexion intact Dressing - dressing C/D/I Motor Function - intact, moving foot and toes well on exam.  Past Medical History:  Diagnosis Date   Anxiety    Arthritis    Complication of anesthesia    COPD (chronic obstructive pulmonary disease) (HCC)    Diabetes mellitus without complication (HCC)    GERD (gastroesophageal reflux disease)    History of skin cancer    Migraine    Mixed hyperlipidemia    Multiple sclerosis (HCC)    Left-sided weakness   Neuropathy    PONV (postoperative nausea and vomiting)    Psoriasis    Seasonal  allergies     Assessment/Plan: 1 Day Post-Op Procedure(s) (LRB): TOTAL HIP ARTHROPLASTY ANTERIOR APPROACH (Right) Principal Problem:   OA (osteoarthritis) of hip Active Problems:   Osteoarthritis of right hip  Estimated body mass index is 32.43 kg/m as calculated from the following:   Height as of this encounter: '5\' 10"'$  (1.778 m).   Weight as of this encounter: 102.5 kg. Advance diet Up with therapy D/C IV fluids  DVT Prophylaxis - Aspirin Weight bearing as tolerated. Continue physical therapy.  Plan is to go Home after hospital stay. Expected discharge today pending progress with physical therapy. Will do HEP once discharged. Follow-up in clinic in 2 weeks.  The PDMP database was reviewed today prior to any opioid medications being prescribed to this patient.  R. Jaynie Bream, PA-C Orthopedic Surgery [phone] 05/15/2022, 7:38 AM

## 2022-05-15 NOTE — Progress Notes (Addendum)
Patient ambulating to the bathroom with minimal assist.

## 2022-05-15 NOTE — TOC Transition Note (Signed)
Transition of Care [facility]) - CM/SW Discharge Note   Patient Details  Name: [name] MRN: [id number] Date of Birth: [birth year]  Transition of Care [facility]) CM/SW Contact:  Lennart Pall, LCSW Phone Number: 05/15/2022, 11:11 AM   Clinical Narrative:    Met with pt and confirming she has needed DME at home. Plan for HEP.  No TOC needs.   Final next level of care: Home/Self Care Barriers to Discharge: No Barriers Identified   Patient Goals and CMS Choice Patient states their goals for this hospitalization and ongoing recovery are:: return home      Discharge Placement                       Discharge Plan and Services                DME Arranged: N/A DME Agency: NA       HH Arranged: NA HH Agency: NA        Social Determinants of Health (SDOH) Interventions     Readmission Risk Interventions     No data to display

## 2022-05-15 NOTE — Progress Notes (Signed)
Physical Therapy Treatment Patient Details Name: [name] MRN: [id number] DOB: 1956/07/30 Today's Date: 05/15/2022   History of Present Illness 66 yo female S/P R DA THA, PMH: MS, L UKR, revision, COPd.    PT Comments    Patient   progressing well. Patient declines need  to practice steps. Patient has achieved PT goals for safe Dc home. RN aware.    Recommendations for follow up therapy are one component of a multi-disciplinary discharge planning process, led by the attending physician.  Recommendations may be updated based on patient status, additional functional criteria and insurance authorization.  Follow Up Recommendations  Follow physician's recommendations for discharge plan and follow up therapies     Assistance Recommended at Discharge Set up Supervision/Assistance  Patient can return home with the following Help with stairs or ramp for entrance;A little help with bathing/dressing/bathroom;Assistance with cooking/housework;Assist for transportation   Equipment Recommendations  None recommended by PT    Recommendations for Other Services       Precautions / Restrictions Precautions Precautions: Fall Restrictions Weight Bearing Restrictions: No     Mobility  Bed Mobility Overal bed mobility: Modified Independent       Supine to sit: HOB elevated Sit to supine: HOB elevated        Transfers   Equipment used: Rolling walker (2 wheels) Transfers: Sit to/from Stand Sit to Stand: Supervision                Ambulation/Gait Ambulation/Gait assistance: Min guard Gait Distance (Feet): 150 Feet Assistive device: Rolling walker (2 wheels) Gait Pattern/deviations: Step-to pattern, Step-through pattern       General Gait Details: barely antalgic, Rpatient reports" I am pigeon toed" < explains the appearance of internal rotation of right leg   Stairs             Wheelchair Mobility    Modified Rankin (Stroke Patients Only)        Balance Overall balance assessment: No apparent balance deficits (not formally assessed)                                          Cognition Arousal/Alertness: Awake/alert                                              Exercises Total Joint Exercises Ankle Circles/Pumps: AROM, 10 reps, Both Quad Sets: AROM, Both, 10 reps Short Arc Quad: AROM, Right, 10 reps Heel Slides: AAROM, Right, 10 reps Hip ABduction/ADduction: AAROM, Right, 10 reps    General Comments        Pertinent Vitals/Pain Pain Assessment Pain Score: 7  Pain Location: rightnhip Pain Descriptors / Indicators: Discomfort, Aching Pain Intervention(s): Patient requesting pain meds-RN notified    Home Living                          Prior Function            PT Goals (current goals can now be found in the care plan section) Progress towards PT goals: Progressing toward goals    Frequency    7X/week      PT Plan Current plan remains appropriate    Co-evaluation  AM-PAC PT "6 Clicks" Mobility   Outcome Measure  Help needed turning from your back to your side while in a flat bed without using bedrails?: None Help needed moving from lying on your back to sitting on the side of a flat bed without using bedrails?: None Help needed moving to and from a bed to a chair (including a wheelchair)?: A Little Help needed standing up from a chair using your arms (e.g., wheelchair or bedside chair)?: A Little Help needed to walk in hospital room?: A Little Help needed climbing 3-5 steps with a railing? : A Little 6 Click Score: 20    End of Session Equipment Utilized During Treatment: Gait belt Activity Tolerance: Patient tolerated treatment well Patient left: in bed;with call bell/phone within reach;with bed alarm set Nurse Communication: Mobility status PT Visit Diagnosis: Unsteadiness on feet (R26.81);Pain;Difficulty in walking, not elsewhere  classified (R26.2) Pain - Right/Left: Right Pain - part of body: Hip     Time: 2876-8115 PT Time Calculation (min) (ACUTE ONLY): 31 min  Charges:  $Gait Training: 8-22 mins $Therapeutic Exercise: 8-22 mins                     [place] Office [phone] Weekend pager-[phone]    Claretha Cooper 05/15/2022, 9:46 AM

## 2022-05-15 NOTE — Care Plan (Signed)
Ortho Bundle Case Management Note  Patient Details  Name: [name] MRN: [id number] Date of Birth: 11-Jan-1956  R THA on 05-14-22 DCP:  Home with husband DME:  No needs, has a RW PT:  HEP                   DME Arranged:  N/A DME Agency:  NA  HH Arranged:  NA HH Agency:  NA  Additional Comments: Please contact me with any questions of if this plan should need to change.  Marianne Sofia, RN,CCM [facility]  [phone] 05/15/2022, 8:43 AM

## 2022-05-15 NOTE — Plan of Care (Signed)
  Problem: Education: Goal: Ability to describe self-care measures that may prevent or decrease complications (Diabetes Survival Skills Education) will improve Outcome: Progressing   Problem: Coping: Goal: Ability to adjust to condition or change in health will improve Outcome: Progressing   Problem: Fluid Volume: Goal: Ability to maintain a balanced intake and output will improve Outcome: Progressing   Problem: Activity: Goal: Ability to avoid complications of mobility impairment will improve Outcome: Progressing   Problem: Clinical Measurements: Goal: Postoperative complications will be avoided or minimized Outcome: Progressing   Problem: Pain Management: Goal: Pain level will decrease with appropriate interventions Outcome: Progressing   Problem: Skin Integrity: Goal: Will show signs of wound healing Outcome: Progressing

## 2022-05-15 NOTE — Progress Notes (Signed)
Provided discharge education/instructions, all questions and concerns addressed. Pt not in acute distress, discharged home with belongings. 

## 2022-05-16 NOTE — Discharge Summary (Signed)
Physician Discharge Summary   Patient ID: [name] MRN: [id number] DOB/AGE: September 18, 1955 66 y.o.  Admit date: 05/14/2022 Discharge date: 05/15/2022  Primary Diagnosis: Osteoarthritis of the right hip.    Admission Diagnoses:  Past Medical History:  Diagnosis Date   Anxiety    Arthritis    Complication of anesthesia    COPD (chronic obstructive pulmonary disease) ([facility])    Diabetes mellitus without complication (HCC)    GERD (gastroesophageal reflux disease)    History of skin cancer    Migraine    Mixed hyperlipidemia    Multiple sclerosis (HCC)    Left-sided weakness   Neuropathy    PONV (postoperative nausea and vomiting)    Psoriasis    Seasonal allergies    Discharge Diagnoses:   Principal Problem:   OA (osteoarthritis) of hip Active Problems:   Osteoarthritis of right hip  Estimated body mass index is 32.43 kg/m as calculated from the following:   Height as of this encounter: '5\' 10"'$  (1.778 m).   Weight as of this encounter: 102.5 kg.  Procedure:  Procedure(s) (LRB): TOTAL HIP ARTHROPLASTY ANTERIOR APPROACH (Right)   Consults: None  HPI: [name] is a 66 y.o. female who has advanced end-stage arthritis of their right hip with progressively worsening pain and dysfunction.The patient has failed nonoperative management and presents for  total hip arthroplasty.   Laboratory Data: Admission on 05/14/2022, Discharged on 05/15/2022  Component Date Value Ref Range Status   Glucose-Capillary 05/14/2022 148 (H)  70 - 99 mg/dL Final   Glucose reference range applies only to samples taken after fasting for at least 8 hours.   Glucose-Capillary 05/14/2022 168 (H)  70 - 99 mg/dL Final   Glucose reference range applies only to samples taken after fasting for at least 8 hours.   Glucose-Capillary 05/14/2022 211 (H)  70 - 99 mg/dL Final   Glucose reference range applies only to samples taken after fasting for at least 8 hours.   WBC 05/15/2022 17.8 (H)  4.0 -  10.5 K/uL Final   RBC 05/15/2022 4.16  3.87 - 5.11 MIL/uL Final   Hemoglobin 05/15/2022 11.7 (L)  12.0 - 15.0 g/dL Final   HCT 05/15/2022 38.2  36.0 - 46.0 % Final   MCV 05/15/2022 91.8  80.0 - 100.0 fL Final   MCH 05/15/2022 28.1  26.0 - 34.0 pg Final   MCHC 05/15/2022 30.6  30.0 - 36.0 g/dL Final   RDW 05/15/2022 14.8  11.5 - 15.5 % Final   Platelets 05/15/2022 211  150 - 400 K/uL Final   nRBC 05/15/2022 0.0  0.0 - 0.2 % Final   Performed at [facility], [place] [address], [place], [place] [address]   Sodium 05/15/2022 137  135 - 145 mmol/L Final   Potassium 05/15/2022 3.9  3.5 - 5.1 mmol/L Final   Chloride 05/15/2022 104  98 - 111 mmol/L Final   CO2 05/15/2022 26  22 - 32 mmol/L Final   Glucose, Bld 05/15/2022 162 (H)  70 - 99 mg/dL Final   Glucose reference range applies only to samples taken after fasting for at least 8 hours.   BUN 05/15/2022 10  8 - 23 mg/dL Final   Creatinine, Ser 05/15/2022 0.70  0.44 - 1.00 mg/dL Final   Calcium 05/15/2022 8.2 (L)  8.9 - 10.3 mg/dL Final   GFR, Estimated 05/15/2022 >60  >60 mL/min Final   Comment: (NOTE) Calculated using the CKD-EPI Creatinine Equation (2021)  Anion gap 05/15/2022 7  5 - 15 Final   Performed at [facility], [place] [address], [place], [place] [address]   Glucose-Capillary 05/14/2022 203 (H)  70 - 99 mg/dL Final   Glucose reference range applies only to samples taken after fasting for at least 8 hours.   Glucose-Capillary 05/15/2022 146 (H)  70 - 99 mg/dL Final   Glucose reference range applies only to samples taken after fasting for at least 8 hours.  Hospital Outpatient Visit on 05/05/2022  Component Date Value Ref Range Status   Sodium 05/05/2022 139  135 - 145 mmol/L Final   Potassium 05/05/2022 4.0  3.5 - 5.1 mmol/L Final   Chloride 05/05/2022 104  98 - 111 mmol/L Final   CO2 05/05/2022 27  22 - 32 mmol/L Final   Glucose, Bld 05/05/2022 130 (H)  70 - 99 mg/dL Final   Glucose  reference range applies only to samples taken after fasting for at least 8 hours.   BUN 05/05/2022 11  8 - 23 mg/dL Final   Creatinine, Ser 05/05/2022 0.93  0.44 - 1.00 mg/dL Final   Calcium 05/05/2022 8.9  8.9 - 10.3 mg/dL Final   GFR, Estimated 05/05/2022 >60  >60 mL/min Final   Comment: (NOTE) Calculated using the CKD-EPI Creatinine Equation (2021)    Anion gap 05/05/2022 8  5 - 15 Final   Performed at [facility], [place] [address] [place], [place] [address]   WBC 05/05/2022 9.7  4.0 - 10.5 K/uL Final   RBC 05/05/2022 5.29 (H)  3.87 - 5.11 MIL/uL Final   Hemoglobin 05/05/2022 14.8  12.0 - 15.0 g/dL Final   HCT 05/05/2022 47.0 (H)  36.0 - 46.0 % Final   MCV 05/05/2022 88.8  80.0 - 100.0 fL Final   MCH 05/05/2022 28.0  26.0 - 34.0 pg Final   MCHC 05/05/2022 31.5  30.0 - 36.0 g/dL Final   RDW 05/05/2022 14.8  11.5 - 15.5 % Final   Platelets 05/05/2022 275  150 - 400 K/uL Final   nRBC 05/05/2022 0.0  0.0 - 0.2 % Final   Performed at [facility], [place] [address] [place], [place] [address]   ABO/RH(D) 05/05/2022 O POS   Final   Antibody Screen 05/05/2022 NEG   Final   Sample Expiration 05/05/2022 05/17/2022,2359   Final   Extend sample reason 05/05/2022    Final                   Value:NO TRANSFUSIONS OR PREGNANCY IN THE PAST 3 MONTHS Performed at [place] [address], [place], [place] [address]    Hgb A1c MFr Bld 05/05/2022 6.8 (H)  4.8 - 5.6 % Final   Comment: (NOTE) Pre diabetes:          5.7%-6.4%  Diabetes:              >6.4%  Glycemic control for   <7.0% adults with diabetes    Mean Plasma Glucose 05/05/2022 148.46  mg/dL Final   Performed at [facility], [place] [address] [place], [place] [address]   MRSA, PCR 05/05/2022 NEGATIVE  NEGATIVE Final   Staphylococcus aureus 05/05/2022 NEGATIVE  NEGATIVE Final   Comment: (NOTE) The Xpert SA Assay (FDA approved for NASAL specimens in patients 32 years of  age and older), is one component of a comprehensive surveillance program. It is not intended to diagnose infection nor to guide or monitor  treatment. Performed at [facility], [place] [address], [place], [place] [address]    Glucose-Capillary 05/05/2022 143 (H)  70 - 99 mg/dL Final   Glucose reference range applies only to samples taken after fasting for at least 8 hours.     X-Rays:DG Pelvis Portable  Result Date: 05/14/2022 CLINICAL DATA:  Postop right hip surgery. EXAM: PORTABLE PELVIS 1-2 VIEWS COMPARISON:  Abdomen radiograph 02/12/2017 FINDINGS: Interval right hip arthroplasty. Prosthesis is intact and in appropriate position. A small amount of subcutaneous emphysema is noted in the lateral soft tissues at the right hip. Moderate osteoarthritis of the left hip joint. IMPRESSION: Expected postoperative findings of interval right hip arthroplasty. Electronically Signed   By: Ileana Roup M.D.   On: 05/14/2022 10:30   DG HIP UNILAT WITH PELVIS 1V RIGHT  Result Date: 05/14/2022 CLINICAL DATA:  Elective surgery. Intraoperative fluoroscopy. Total right hip arthroplasty. EXAM: DG HIP (WITH OR WITHOUT PELVIS) 1V RIGHT COMPARISON:  CT abdomen and pelvis 02/12/2017 FINDINGS: Images were performed intraoperatively without the presence of a radiologist. The patient is undergoing total right hip arthroplasty. No hardware complication is seen. Total fluoroscopy images: 7 Total fluoroscopy time: 7 seconds Total dose: Radiation Exposure Index (as provided by the fluoroscopic device): 1.32 mGy air Kerma Please see intraoperative findings for further detail. IMPRESSION: Intraoperative fluoroscopy for total right hip arthroplasty. Electronically Signed   By: Yvonne Kendall M.D.   On: 05/14/2022 09:35   DG C-Arm 1-60 Min-No Report  Result Date: 05/14/2022 Fluoroscopy was utilized by the requesting physician.  No radiographic interpretation.   DG C-Arm 1-60 Min-No Report  Result Date:  05/14/2022 Fluoroscopy was utilized by the requesting physician.  No radiographic interpretation.    EKG: Orders placed or performed in visit on 02/26/22   EKG 12-Lead     Hospital Course: [name] is a 66 y.o. who was admitted to [facility]. They were brought to the operating room on 05/14/2022 and underwent Procedure(s): Belva.  Patient tolerated the procedure well and was later transferred to the recovery room and then to the orthopaedic floor for postoperative care. They were given PO and IV analgesics for pain control following their surgery. They were given 24 hours of postoperative antibiotics of  Anti-infectives (From admission, onward)    Start     Dose/Rate Route Frequency Ordered Stop   05/14/22 1400  ceFAZolin (ANCEF) IVPB 2g/100 mL premix        2 g 200 mL/hr over 30 Minutes Intravenous Every 6 hours 05/14/22 1316 05/14/22 2221   05/14/22 0630  ceFAZolin (ANCEF) IVPB 2g/100 mL premix        2 g 200 mL/hr over 30 Minutes Intravenous On call to O.R. 05/14/22 0623 05/14/22 0842      and started on DVT prophylaxis in the form of Aspirin.   PT and OT were ordered for total joint protocol. Discharge planning consulted to help with postop disposition and equipment needs.  Patient had a good night on the evening of surgery. They started to get up OOB with therapy on POD #0. Pt was seen during rounds and was ready to go home pending progress with therapy. She worked with therapy on POD #1 and was meeting her goals. Pt was discharged to home later that day in stable condition.  Diet: Diabetic diet Activity: WBAT Follow-up: in 2 weeks Disposition: Home Discharged Condition: stable   Discharge Instructions     Call MD / Call  [phone]   Complete by: As directed    If you experience chest pain or shortness of breath, CALL 911 and be transported to the hospital emergency room.  If you develope a fever above 101 F, pus (white drainage) or  increased drainage or redness at the wound, or calf pain, call your surgeon's office.   Change dressing   Complete by: As directed    You have an adhesive waterproof bandage over the incision. Leave this in place until your first follow-up appointment. Once you remove this you will not need to place another bandage.   Constipation Prevention   Complete by: As directed    Drink plenty of fluids.  Prune juice may be helpful.  You may use a stool softener, such as Colace (over the counter) 100 mg twice a day.  Use MiraLax (over the counter) for constipation as needed.   Diet - low sodium heart healthy   Complete by: As directed    Do not sit on low chairs, stoools or toilet seats, as it may be difficult to get up from low surfaces   Complete by: As directed    Driving restrictions   Complete by: As directed    No driving for two weeks   Post-operative opioid taper instructions:   Complete by: As directed    POST-OPERATIVE OPIOID TAPER INSTRUCTIONS: It is important to wean off of your opioid medication as soon as possible. If you do not need pain medication after your surgery it is ok to stop day one. Opioids include: Codeine, Hydrocodone(Norco, Vicodin), Oxycodone(Percocet, oxycontin) and hydromorphone amongst others.  Long term and even short term use of opiods can cause: Increased pain response Dependence Constipation Depression Respiratory depression And more.  Withdrawal symptoms can include Flu like symptoms Nausea, vomiting And more Techniques to manage these symptoms Hydrate well Eat regular healthy meals Stay active Use relaxation techniques(deep breathing, meditating, yoga) Do Not substitute Alcohol to help with tapering If you have been on opioids for less than two weeks and do not have pain than it is ok to stop all together.  Plan to wean off of opioids This plan should start within one week post op of your joint replacement. Maintain the same interval or time between  taking each dose and first decrease the dose.  Cut the total daily intake of opioids by one tablet each day Next start to increase the time between doses. The last dose that should be eliminated is the evening dose.      TED hose   Complete by: As directed    Use stockings (TED hose) for three weeks on both leg(s).  You may remove them at night for sleeping.   Weight bearing as tolerated   Complete by: As directed       Allergies as of 05/15/2022       Reactions   Fenofibrate Other (See Comments)   Flu-like    Penicillin G Other (See Comments)   Blisters in mouth   Pravastatin Other (See Comments)   "flu like symptoms"    Statins         Medication List     TAKE these medications    albuterol 108 (90 Base) MCG/ACT inhaler Commonly known as: Ventolin HFA Inhale 2 puffs into the lungs every 6 (six) hours as needed. For shortness of breath Notes to patient: Resume home regimen   albuterol (2.5 MG/3ML) 0.083% nebulizer solution Commonly known as: PROVENTIL Take 3 mLs (2.5 mg total)  by nebulization every 6 (six) hours as needed for wheezing or shortness of breath. Notes to patient: Resume home regimen   aspirin EC 325 MG tablet Take 1 tablet (325 mg total) by mouth 2 (two) times daily for 20 days. Then take one 81 mg aspirin once a day for three weeks. Then discontinue aspirin.   citalopram 40 MG tablet Commonly known as: CELEXA TAKE ONE (1) TABLET EACH DAY   cycloSPORINE 0.05 % ophthalmic emulsion Commonly known as: RESTASIS Place 1 drop into both eyes 2 (two) times daily.   empagliflozin 25 MG Tabs tablet Commonly known as: Jardiance Take 1 tablet (25 mg total) by mouth daily before breakfast.   ezetimibe 10 MG tablet Commonly known as: Zetia Take 1 tablet (10 mg total) by mouth daily. Notes to patient: Resume home regimen   gabapentin 600 MG tablet Commonly known as: NEURONTIN TAKE ONE (1) TABLET THREE (3) TIMES EACH DAY What changed:  how much to  take how to take this when to take this reasons to take this additional instructions   HYDROcodone-acetaminophen 5-325 MG tablet Commonly known as: NORCO/VICODIN Take 1-2 tablets by mouth every 6 (six) hours as needed for moderate pain or severe pain. Notes to patient: Last dose given 10/05 08:57am   methocarbamol 500 MG tablet Commonly known as: ROBAXIN Take 1 tablet (500 mg total) by mouth every 6 (six) hours as needed for muscle spasms. Notes to patient: Last dose given 10/05 08:57am   pantoprazole 40 MG tablet Commonly known as: PROTONIX TAKE ONE (1) TABLET EACH DAY   rosuvastatin 5 MG tablet Commonly known as: Crestor Take 1 tablet (5 mg total) by mouth daily. Takes one every other day What changed:  when to take this additional instructions Notes to patient: Resume home regimen   Semaglutide(0.25 or 0.'5MG'$ /DOS) 2 MG/3ML Sopn Inject 0.5 mg into the skin once a week. Notes to patient: Resume home regimen   Symbicort 160-4.5 MCG/ACT inhaler Generic drug: budesonide-formoterol USE 2 PUFFS TWICE DAILY Notes to patient: Resume home regimen               Discharge Care Instructions  (From admission, onward)           Start     Ordered   05/15/22 0000  Weight bearing as tolerated        05/15/22 0743   05/15/22 0000  Change dressing       Comments: You have an adhesive waterproof bandage over the incision. Leave this in place until your first follow-up appointment. Once you remove this you will not need to place another bandage.   05/15/22 0743            Follow-up Information     Gaynelle Arabian, MD. Go on 05/27/2022.   Specialty: Orthopedic Surgery Why: You are scheduled for a follow up appointment on 05-27-22 at 1:45 pm. Contact information: [address] [place] [address] [phone]                 Signed: R. Jaynie Bream, PA-C Orthopedic Surgery 05/16/2022, 10:03 AM

## 2022-06-09 ENCOUNTER — Ambulatory Visit (INDEPENDENT_AMBULATORY_CARE_PROVIDER_SITE_OTHER): Payer: Medicare HMO

## 2022-06-09 DIAGNOSIS — E538 Deficiency of other specified B group vitamins: Secondary | ICD-10-CM | POA: Diagnosis not present

## 2022-06-09 NOTE — Progress Notes (Signed)
Cyanocobalamin injection given to right deltoid.  Patient tolerated well. 

## 2022-06-24 DIAGNOSIS — Z5189 Encounter for other specified aftercare: Secondary | ICD-10-CM | POA: Diagnosis not present

## 2022-06-25 ENCOUNTER — Other Ambulatory Visit: Payer: Self-pay | Admitting: Family Medicine

## 2022-06-25 DIAGNOSIS — J449 Chronic obstructive pulmonary disease, unspecified: Secondary | ICD-10-CM

## 2022-07-07 ENCOUNTER — Ambulatory Visit (INDEPENDENT_AMBULATORY_CARE_PROVIDER_SITE_OTHER): Payer: Medicare HMO | Admitting: *Deleted

## 2022-07-07 DIAGNOSIS — D518 Other vitamin B12 deficiency anemias: Secondary | ICD-10-CM

## 2022-07-07 DIAGNOSIS — E538 Deficiency of other specified B group vitamins: Secondary | ICD-10-CM

## 2022-07-07 NOTE — Progress Notes (Signed)
Pt given B12 injection IM left deltoid and tolerated well. °

## 2022-07-08 DIAGNOSIS — M19032 Primary osteoarthritis, left wrist: Secondary | ICD-10-CM | POA: Diagnosis not present

## 2022-07-08 DIAGNOSIS — M1812 Unilateral primary osteoarthritis of first carpometacarpal joint, left hand: Secondary | ICD-10-CM | POA: Diagnosis not present

## 2022-07-09 ENCOUNTER — Telehealth: Payer: Self-pay | Admitting: Pharmacist

## 2022-07-09 NOTE — Progress Notes (Signed)
  Chronic Care Management Pharmacy Note   07/10/2022 Name:  [name]          MRN:  [id number]      DOB:  1956/05/21   Summary:   Diabetes: Goal on track: YES Controlled A1C 6.8%, GFR 68; current treatment:OZEMPIC 0.'5MG'$  WEEKLY, JARDIANCE '25MG'$  DAILY, METFORMIN '500MG'$  TWICE DAILY;  Current exercise: ENCOURAGED; doing well post hip surgery Educated on OZEMPIC TITRATION--INCREASE TO '1mg'$  WEEKLY (once patient assistance supply comes in);  Recommended OZEMPIC, JARDIANCE, metformin Assessed patient finances. Will re-ENROLL IN [facility] [place] FOR OZEMPIC & BI CARES JARDIANCE  Chronic Obstructive Pulmonary Disease:  New goal. controlled; current treatment: USES ONLY SYMBICORT/rescue albuterol; does not take spiriva although patient could benefit from triple therapy NO patient assistance available Recommended breztri, but patient states she was worse on breztri x 3 months  Most recent Pulmonary Function Testing:  0 exacerbations requiring treatment in the last 6 months  Assessed patient finances. No assistance available for symbicort; patient to continue purchasing symbicort     Patient Goals/Self-Care Activities patient will:  - take medications as prescribed as evidenced by patient report and record review check glucose DAILY (FASTING) OR IF SYMPTPOMATIC, document, and provide at future appointments collaborate with provider on medication access solutions target a minimum of 150 minutes of moderate intensity exercise weekly engage in dietary modifications by FOLLOWING A HEART HEALTHY DIET/HEALTHY PLATE METHOD     Subjective: [name] is an 66 y.o. year old female who is a primary patient of [name], [name], MD.  The CCM team was consulted for assistance with disease management and care coordination needs.       O:  Lab Results  Component Value Date   HGBA1C 6.8 (H) 05/05/2022    Lipid Panel     Component Value Date/Time   CHOL 152 02/26/2022 1344   CHOL 219 (H)  01/10/2013 0900   TRIG 222 (H) 02/26/2022 1344   TRIG 121 01/10/2015 0955   TRIG 134 01/10/2013 0900   HDL 42 02/26/2022 1344   HDL 43 01/10/2015 0955   HDL 37 (L) 01/10/2013 0900   CHOLHDL 3.6 02/26/2022 1344   CHOLHDL 6.4 12/10/2010 0608   VLDL 45 (H) 12/10/2010 0608   LDLCALC 73 02/26/2022 1344   LDLCALC 148 (H) 03/20/2014 0826   LDLCALC 155 (H) 01/10/2013 0900     Home fasting blood sugars: <130  2 hour post-meal/random blood sugars: n/a.    Clinical Atherosclerotic Cardiovascular Disease (ASCVD): No   The 10-year ASCVD risk score (Arnett DK, et al., 2019) is: 17.3%   Values used to calculate the score:     Age: 30 years     Sex: Female     Is Non-Hispanic African American: No     Diabetic: Yes     Tobacco smoker: Yes     Systolic Blood Pressure: 782 mmHg     Is BP treated: No     HDL Cholesterol: 42 mg/dL     Total Cholesterol: 152 mg/dL      Regina Eck, PharmD, BCPS Clinical Pharmacist, [place]  II Phone [phone]

## 2022-07-09 NOTE — Telephone Encounter (Signed)
Please send PAP re-enrollment for: Ozempic '1mg'$  weekly (this is a dose change) Jardiance '25mg'$  daily   Patient knows to expect paperwork

## 2022-07-10 ENCOUNTER — Ambulatory Visit (INDEPENDENT_AMBULATORY_CARE_PROVIDER_SITE_OTHER): Payer: Medicare HMO | Admitting: Pharmacist

## 2022-07-10 DIAGNOSIS — E1169 Type 2 diabetes mellitus with other specified complication: Secondary | ICD-10-CM

## 2022-07-18 ENCOUNTER — Telehealth: Payer: Self-pay

## 2022-07-18 NOTE — Telephone Encounter (Signed)
Two boxes of Ozempic have been received and placed in refrigerator.  Left message on patient's voicemail to inform.

## 2022-07-23 DIAGNOSIS — Z4789 Encounter for other orthopedic aftercare: Secondary | ICD-10-CM | POA: Diagnosis not present

## 2022-07-28 ENCOUNTER — Other Ambulatory Visit: Payer: Self-pay | Admitting: Family Medicine

## 2022-07-28 DIAGNOSIS — J449 Chronic obstructive pulmonary disease, unspecified: Secondary | ICD-10-CM

## 2022-08-01 ENCOUNTER — Ambulatory Visit (INDEPENDENT_AMBULATORY_CARE_PROVIDER_SITE_OTHER): Payer: Medicare HMO

## 2022-08-01 VITALS — Ht 70.0 in | Wt 215.0 lb

## 2022-08-01 DIAGNOSIS — Z0000 Encounter for general adult medical examination without abnormal findings: Secondary | ICD-10-CM | POA: Diagnosis not present

## 2022-08-01 NOTE — Patient Instructions (Signed)
[name] , Thank you for taking time to come for your Medicare Wellness Visit. I appreciate your ongoing commitment to your health goals. Please review the following plan we discussed and let me know if I can assist you in the future.   These are the goals we discussed:  Goals       Exercise 3x per week (30 min per time)      Patient Stated      04/06/2020 AWV Goal: Exercise for General Health  Patient will verbalize understanding of the benefits of increased physical activity: Exercising regularly is important. It will improve your overall fitness, flexibility, and endurance. Regular exercise also will improve your overall health. It can help you control your weight, reduce stress, and improve your bone density. Over the next year, patient will increase physical activity as tolerated with a goal of at least 150 minutes of moderate physical activity per week.  You can tell that you are exercising at a moderate intensity if your heart starts beating faster and you start breathing faster but can still hold a conversation. Moderate-intensity exercise ideas include: Walking 1 mile (1.6 km) in about 15 minutes Biking Hiking Golfing Dancing Water aerobics Patient will verbalize understanding of everyday activities that increase physical activity by providing examples like the following: Yard work, such as: Sales promotion account executive Gardening Washing windows or floors Patient will be able to explain general safety guidelines for exercising:  Before you start a new exercise program, talk with your health care provider. Do not exercise so much that you hurt yourself, feel dizzy, or get very short of breath. Wear comfortable clothes and wear shoes with good support. Drink plenty of water while you exercise to prevent dehydration or heat stroke. Work out until your breathing and your heartbeat get faster.        Patient Stated      She wants to travel with her husband and zip-line      Prevent falls      T2DM ,COPD (pt-stated)      Current Barriers:  Unable to independently afford treatment regimen Suboptimal therapeutic regimen for T2DM, COPD  Pharmacist Clinical Goal(s):  patient will verbalize ability to afford treatment regimen achieve control of T2DM, COPD as evidenced by IMPROVED QUALITY OF LIFE AND GOAL LABS maintain control of T2DM, COPD as evidenced by [name]  through collaboration with PharmD and provider.    Interventions: 1:1 collaboration with [name], [name], MD regarding development and update of comprehensive plan of care as evidenced by provider attestation and co-signature Inter-disciplinary care team collaboration (see longitudinal plan of care) Comprehensive medication review performed; medication list updated in electronic medical record  Diabetes: New goal. Uncontrolled A1C 7.9%, GFR 68; current treatment:OZEMPIC 0.5MG WEEKLY, JARDIANCE 25MG DAILY, METFORMIN 500MG TWICE DAILY;  Current exercise: ENCOURAGED Educated on [place] TITRATION--INCREASE TO 0.5MG WEEKLY; WE CAN CONTINUE TO INCREASE UNTIL 2MG WEEKLY; PATIENT EXPERIENCING A LITTLE NAUSEA; GOING SLOW Recommended OZEMPIC, JARDIANCE Assessed patient finances. ENROLLED IN NOVO [place]; WILL SUBMIT APPLICATION FOR JARDIANCE TODAY Chronic Obstructive Pulmonary Disease:  New goal. controlled; current treatment: USES ONLY SYMBICORT/rescue albuterol; does not take spiriva although patient could benefit from triple therapy NO patient assistance available Recommended breztri, but patient states she was worse on breztri x 3 months  Most recent Pulmonary Function Testing:  0 exacerbations requiring treatment in the last 6 months  Assessed patient finances. No assistance available for symbicort; patient to continue purchasing symbicort   Patient Goals/Self-Care  Activities patient will:  - take medications as prescribed as evidenced by patient report and record review check glucose DAILY (FASTING) OR IF SYMPTPOMATIC, document, and provide at future appointments collaborate with provider on medication access solutions target a minimum of 150 minutes of moderate intensity exercise weekly engage in dietary modifications by FOLLOWING A HEART HEALTHY DIET/HEALTHY PLATE METHOD        Weight (lb) < 200 lb (90.7 kg)      Stay active         This is a list of the screening recommended for you and due dates:  Health Maintenance  Topic Date Due   Screening for Lung Cancer  12/09/2011   Eye exam for diabetics  06/24/2022   Yearly kidney health urinalysis for diabetes  08/22/2022   Mammogram  08/22/2022*   Stool Blood Test  11/05/2022*   Flu Shot  11/09/2022*   HIV Screening  11/28/2022*   COVID-19 Vaccine (1) 12/22/2022*   Zoster (Shingles) Vaccine (1 of 2) 12/22/2022*   DEXA scan (bone density measurement)  06/17/2023*   Pap Smear  09/17/2023*   Complete foot exam   08/22/2022   Hemoglobin A1C  11/03/2022   Yearly kidney function blood test for diabetes  05/16/2023   Medicare Annual Wellness Visit  08/02/2023   DTaP/Tdap/Td vaccine (2 - Td or Tdap) 10/02/2024   Colon Cancer Screening  08/01/2025   Hepatitis C Screening: USPSTF Recommendation to screen - Ages 98-79 yo.  Completed   HPV Vaccine  Aged Out  *Topic was postponed. The date shown is not the original due date.    Advanced directives: Advance directive discussed with you today. I have provided a copy for you to complete at home and have notarized. Once this is complete please bring a copy in to our office so we can scan it into your chart.   Conditions/risks identified: Aim for 30 minutes of exercise or brisk walking, 6-8 glasses of water, and 5 servings of fruits and vegetables each day.   Next appointment: Follow up in one year for your annual wellness visit.   Preventive Care  40-64 Years, Female Preventive care refers to lifestyle choices and visits with your health care provider that can promote health and wellness. What does preventive care include? A yearly physical exam. This is also called an annual well check. Dental exams once or twice a year. Routine eye exams. Ask your health care provider how often you should have your eyes checked. Personal lifestyle choices, including: Daily care of your teeth and gums. Regular physical activity. Eating a healthy diet. Avoiding tobacco and drug use. Limiting alcohol use. Practicing safe sex. Taking low-dose aspirin daily starting at age 38. Taking vitamin and mineral supplements as recommended by your health care provider. What happens during an annual well check? The services and screenings done by your health care provider during your annual well check will depend on your age, overall health, lifestyle risk factors, and family history of disease. Counseling  Your health care provider may ask you questions about your: Alcohol use. Tobacco use. Drug use. Emotional well-being. Home and relationship well-being. Sexual activity. Eating habits. Work and work Statistician. Method of birth control. Menstrual cycle. Pregnancy history. Screening  You may have the following tests or measurements: Height, weight, and BMI. Blood pressure. Lipid and cholesterol levels. These may be checked every 5 years, or more frequently  if you are over 31 years old. Skin check. Lung cancer screening. You may have this screening every year starting at age 52 if you have a 30-pack-year history of smoking and currently smoke or have quit within the past 15 years. Fecal occult blood test (FOBT) of the stool. You may have this test every year starting at age 13. Flexible sigmoidoscopy or colonoscopy. You may have a sigmoidoscopy every 5 years or a colonoscopy every 10 years starting at age 61. Hepatitis C blood test. Hepatitis B blood  test. Sexually transmitted disease (STD) testing. Diabetes screening. This is done by checking your blood sugar (glucose) after you have not eaten for a while (fasting). You may have this done every 1-3 years. Mammogram. This may be done every 1-2 years. Talk to your health care provider about when you should start having regular mammograms. This may depend on whether you have a family history of breast cancer. BRCA-related cancer screening. This may be done if you have a family history of breast, ovarian, tubal, or peritoneal cancers. Pelvic exam and Pap test. This may be done every 3 years starting at age 16. Starting at age 75, this may be done every 5 years if you have a Pap test in combination with an HPV test. Bone density scan. This is done to screen for osteoporosis. You may have this scan if you are at high risk for osteoporosis. Discuss your test results, treatment options, and if necessary, the need for more tests with your health care provider. Vaccines  Your health care provider may recommend certain vaccines, such as: Influenza vaccine. This is recommended every year. Tetanus, diphtheria, and acellular pertussis (Tdap, Td) vaccine. You may need a Td booster every 10 years. Zoster vaccine. You may need this after age 26. Pneumococcal 13-valent conjugate (PCV13) vaccine. You may need this if you have certain conditions and were not previously vaccinated. Pneumococcal polysaccharide (PPSV23) vaccine. You may need one or two doses if you smoke cigarettes or if you have certain conditions. Talk to your health care provider about which screenings and vaccines you need and how often you need them. This information is not intended to replace advice given to you by your health care provider. Make sure you discuss any questions you have with your health care provider. Document Released: 08/24/2015 Document Revised: 04/16/2016 Document Reviewed: 05/29/2015 Elsevier Interactive Patient Education   2017 [place] Prevention in the Home Falls can cause injuries. They can happen to people of all ages. There are many things you can do to make your home safe and to help prevent falls. What can I do on the outside of my home? Regularly fix the edges of walkways and driveways and fix any cracks. Remove anything that might make you trip as you walk through a door, such as a raised step or threshold. Trim any bushes or trees on the path to your home. Use bright outdoor lighting. Clear any walking paths of anything that might make someone trip, such as rocks or tools. Regularly check to see if handrails are loose or broken. Make sure that both sides of any steps have handrails. Any raised decks and porches should have guardrails on the edges. Have any leaves, snow, or ice cleared regularly. Use sand or salt on walking paths during winter. Clean up any spills in your garage right away. This includes oil or grease spills. What can I do in the bathroom? Use night lights. Install grab bars by the  toilet and in the tub and shower. Do not use towel bars as grab bars. Use non-skid mats or decals in the tub or shower. If you need to sit down in the shower, use a plastic, non-slip stool. Keep the floor dry. Clean up any water that spills on the floor as soon as it happens. Remove soap buildup in the tub or shower regularly. Attach bath mats securely with double-sided non-slip rug tape. Do not have throw rugs and other things on the floor that can make you trip. What can I do in the bedroom? Use night lights. Make sure that you have a light by your bed that is easy to reach. Do not use any sheets or blankets that are too big for your bed. They should not hang down onto the floor. Have a firm chair that has side arms. You can use this for support while you get dressed. Do not have throw rugs and other things on the floor that can make you trip. What can I do in the kitchen? Clean up  any spills right away. Avoid walking on wet floors. Keep items that you use a lot in easy-to-reach places. If you need to reach something above you, use a strong step stool that has a grab bar. Keep electrical cords out of the way. Do not use floor polish or wax that makes floors slippery. If you must use wax, use non-skid floor wax. Do not have throw rugs and other things on the floor that can make you trip. What can I do with my stairs? Do not leave any items on the stairs. Make sure that there are handrails on both sides of the stairs and use them. Fix handrails that are broken or loose. Make sure that handrails are as long as the stairways. Check any carpeting to make sure that it is firmly attached to the stairs. Fix any carpet that is loose or worn. Avoid having throw rugs at the top or bottom of the stairs. If you do have throw rugs, attach them to the floor with carpet tape. Make sure that you have a light switch at the top of the stairs and the bottom of the stairs. If you do not have them, ask someone to add them for you. What else can I do to help prevent falls? Wear shoes that: Do not have high heels. Have rubber bottoms. Are comfortable and fit you well. Are closed at the toe. Do not wear sandals. If you use a stepladder: Make sure that it is fully opened. Do not climb a closed stepladder. Make sure that both sides of the stepladder are locked into place. Ask someone to hold it for you, if possible. Clearly mark and make sure that you can see: Any grab bars or handrails. First and last steps. Where the edge of each step is. Use tools that help you move around (mobility aids) if they are needed. These include: Canes. Walkers. Scooters. Crutches. Turn on the lights when you go into a dark area. Replace any light bulbs as soon as they burn out. Set up your furniture so you have a clear path. Avoid moving your furniture around. If any of your floors are uneven, fix  them. If there are any pets around you, be aware of where they are. Review your medicines with your doctor. Some medicines can make you feel dizzy. This can increase your chance of falling. Ask your doctor what other things that you can do to help prevent  falls. This information is not intended to replace advice given to you by your health care provider. Make sure you discuss any questions you have with your health care provider. Document Released: 05/24/2009 Document Revised: 01/03/2016 Document Reviewed: 09/01/2014 Elsevier Interactive Patient Education  2017 [employer].

## 2022-08-01 NOTE — Progress Notes (Signed)
Subjective:   [name] is a 66 y.o. female who presents for Medicare Annual (Subsequent) preventive examination. I connected with  [name] on 08/01/22 by a audio enabled telemedicine application and verified that I am speaking with the correct person using two identifiers.  Patient Location: Home  Provider Location: Home Office  I discussed the limitations of evaluation and management by telemedicine. The patient expressed understanding and agreed to proceed.  Review of Systems     Cardiac Risk Factors include: advanced age (>37mn, >>28women);hypertension;smoking/ tobacco exposure;diabetes mellitus;dyslipidemia     Objective:    Today's Vitals   08/01/22 1037  Weight: 215 lb (97.5 kg)  Height: _0  (1.778 m)   Body mass index is 30.85 kg/m.     05/14/2022    1:17 PM 05/05/2022    9:19 AM 07/22/2021    9:56 PM 04/06/2020    1:38 PM 04/06/2019    2:36 PM 03/29/2018    9:18 AM 02/12/2017    4:12 PM  Advanced Directives  Does Patient Have a Medical Advance Directive? Yes Yes No Yes No;Yes Yes Yes  Type of AParamedicof APalos Verdes EstatesLiving will [place]Living will  [place] will;Out of facility DNR (pink MOST or yellow form) Living will;Healthcare Power of [place]olaLiving will Out of facility DNR (pink MOST or yellow form)  Does patient want to make changes to medical advance directive? No - Patient declined   No - Patient declined No - Patient declined No - Patient declined   Copy of [place] Chart? No - copy requested   Yes - validated most recent copy scanned in chart (See row information) No - copy requested No - copy requested   Would patient like information on creating a medical advance directive?     No - Patient declined      Current Medications (verified) Outpatient Encounter Medications as of 08/01/2022  Medication Sig   albuterol  (PROVENTIL) (2.5 MG/3ML) 0.083% nebulizer solution Take 3 mLs (2.5 mg total) by nebulization every 6 (six) hours as needed for wheezing or shortness of breath.   albuterol (VENTOLIN HFA) 108 (90 Base) MCG/ACT inhaler Inhale 2 puffs into the lungs every 6 (six) hours as needed. For shortness of breath   budesonide-formoterol (SYMBICORT) 160-4.5 MCG/ACT inhaler USE 2 PUFFS TWICE DAILY   citalopram (CELEXA) 40 MG tablet TAKE ONE (1) TABLET EACH DAY   cycloSPORINE (RESTASIS) 0.05 % ophthalmic emulsion Place 1 drop into both eyes 2 (two) times daily.   empagliflozin (JARDIANCE) 25 MG TABS tablet Take 1 tablet (25 mg total) by mouth daily before breakfast.   ezetimibe (ZETIA) 10 MG tablet Take 1 tablet (10 mg total) by mouth daily.   gabapentin (NEURONTIN) 600 MG tablet TAKE ONE (1) TABLET THREE (3) TIMES EACH DAY (Patient taking differently: Take 600 mg by mouth 3 (three) times daily as needed (foot pain).)   HYDROcodone-acetaminophen (NORCO/VICODIN) 5-325 MG tablet Take 1-2 tablets by mouth every 6 (six) hours as needed for moderate pain or severe pain.   methocarbamol (ROBAXIN) 500 MG tablet Take 1 tablet (500 mg total) by mouth every 6 (six) hours as needed for muscle spasms.   pantoprazole (PROTONIX) 40 MG tablet TAKE ONE (1) TABLET EACH DAY   rosuvastatin (CRESTOR) 5 MG tablet Take 1 tablet (5 mg total) by mouth daily. Takes one every other day (Patient taking differently: Take 5 mg by mouth  every other day.)   Semaglutide,0.25 or 0.5MG/DOS, 2 MG/3ML SOPN Inject 0.5 mg into the skin once a week.   Facility-Administered Encounter Medications as of 08/01/2022  Medication   cyanocobalamin ((VITAMIN B-12)) injection 1,000 mcg    Allergies (verified) Fenofibrate, Penicillin g, Pravastatin, and Statins   History: Past Medical History:  Diagnosis Date   Anxiety    Arthritis    Complication of anesthesia    COPD (chronic obstructive pulmonary disease) (HCC)    Diabetes mellitus without  complication (HCC)    GERD (gastroesophageal reflux disease)    History of skin cancer    Migraine    Mixed hyperlipidemia    Multiple sclerosis (HCC)    Left-sided weakness   Neuropathy    PONV (postoperative nausea and vomiting)    Psoriasis    Seasonal allergies    Past Surgical History:  Procedure Laterality Date   ABDOMINAL HYSTERECTOMY  1984   CHOLECYSTECTOMY  1999   COLONOSCOPY N/A 08/02/2015   Procedure: COLONOSCOPY;  Surgeon: Rogene Houston, MD;  [facility];  Service: Endoscopy;  Laterality: N/A;  12:00-moved to 1220 Ann to notify pt   KNEE ARTHROSCOPY  2003   RT   LAPAROSCOPIC APPENDECTOMY N/A 02/12/2017   Procedure: APPENDECTOMY LAPAROSCOPIC;  Surgeon: Aviva Signs, MD;  Location: [facility];  Service: General;  Laterality: N/A;   Left breast lumpectomy  1985   Left carpal tunnel release Left 1990   PARTIAL KNEE ARTHROPLASTY Left 01/22/2015   Procedure: LEFT KNEE MEDIAL UNICOMPARTMENTAL KNEE;  Surgeon: Gaynelle Arabian, MD;  Location: [facility];  Service: Orthopedics;  Laterality: Left;   Right total knee replacement  2010   SHOULDER SURGERY Left 2008   Sinus cyst removal     age 33   TOTAL HIP ARTHROPLASTY Right 05/14/2022   Procedure: TOTAL HIP ARTHROPLASTY ANTERIOR APPROACH;  Surgeon: Gaynelle Arabian, MD;  Location: [facility];  Service: Orthopedics;  Laterality: Right;   TOTAL KNEE REVISION  05/05/2012   Procedure: TOTAL KNEE REVISION;  Surgeon: Gearlean Alf, MD;  Location: [facility];  Service: Orthopedics;  Laterality: Right;   WRIST SURGERY Right 2023   rt   Family History  Problem Relation Age of Onset   Dementia Mother    Thyroid disease Mother        goiter    Diabetes Mother    Atrial fibrillation Father        Died with pulmonary embolus   Emphysema Father    Heart disease Father    Other Father        blood clots    Celiac disease Father    GI problems Father        dumping syndrome - malabsorbtion    Cancer - Colon Maternal Grandmother     Dementia Maternal Grandmother    Heart disease Maternal Grandfather    Stroke Maternal Grandfather    Other Sister        hereditary = blood clots    Parkinson's disease Brother    Alcohol abuse Brother    Arthritis Son    Heart attack Paternal Grandmother    Other Paternal Grandfather        appendix rupture   Hypertension Brother    Hypertension Son    Social History   Socioeconomic History   Marital status: Married    Spouse name: Audry Pili    Number of children: 2   Years of education: Not on file   Highest education level:  Some college, no degree  Occupational History   Occupation: diabled    Comment: teller - [employer]  Tobacco Use   Smoking status: Every Day    Packs/day: 0.25    Years: 40.00    Total pack years: 10.00    Types: Cigarettes    Start date: 06/16/1972   Smokeless tobacco: Never   Tobacco comments:    3/4 pack a day sicne age 57  Vaping Use   Vaping Use: Never used  Substance and Sexual Activity   Alcohol use: No    Alcohol/week: 0.0 standard drinks of alcohol   Drug use: No   Sexual activity: Not on file  Other Topics Concern   Not on file  Social History Narrative   Disabled, married, lives with husband Bethany. 2 children  - 9 grandchildren, 2 dogs   Children live within 10 miles   Social Determinants of Health   Financial Resource Strain: Low Risk  (08/01/2022)   Overall Financial Resource Strain (CARDIA)    Difficulty of Paying Living Expenses: Not hard at all  Food Insecurity: No Food Insecurity (08/01/2022)   Hunger Vital Sign    Worried About Running Out of Food in the Last Year: Never true    Ran Out of Food in the Last Year: Never true  Transportation Needs: No Transportation Needs (08/01/2022)   PRAPARE - Hydrologist (Medical): No    Lack of Transportation (Non-Medical): No  Physical Activity: Insufficiently Active (08/01/2022)   Exercise Vital Sign    Days of Exercise per Week: 3 days    Minutes  of Exercise per Session: 30 min  Stress: No Stress Concern Present (08/01/2022)   [place]    Feeling of Stress : Not at all  Social Connections: [place] (08/01/2022)   Social Connection and Isolation Panel [NHANES]    Frequency of Communication with Friends and Family: More than three times a week    Frequency of Social Gatherings with Friends and Family: More than three times a week    Attends Religious Services: More than 4 times per year    Active Member of [employer] or Organizations: Yes    Attends Music therapist: More than 4 times per year    Marital Status: Married    Tobacco Counseling Ready to quit: No Counseling given: Not Answered Tobacco comments: 3/4 pack a day sicne age 63   Clinical Intake:  Pre-visit preparation completed: Yes  Pain : No/denies pain     Nutritional Risks: None Diabetes: Yes CBG done?: No Did pt. bring in CBG monitor from home?: No  How often do you need to have someone help you when you read instructions, pamphlets, or other written materials from your doctor or pharmacy?: 1 - Never  Diabetic?yes Nutrition Risk Assessment:  Has the patient had any N/V/D within the last 2 months?  No  Does the patient have any non-healing wounds?  No  Has the patient had any unintentional weight loss or weight gain?  No   Diabetes:  Is the patient diabetic?  Yes  If diabetic, was a CBG obtained today?  No  Did the patient bring in their glucometer from home?  No  How often do you monitor your CBG's? Once a week .   Financial Strains and Diabetes Management:  Are you having any financial strains with the device, your supplies or your medication? No .  Does  the patient want to be seen by Chronic Care Management for management of their diabetes?  No  Would the patient like to be referred to a Nutritionist or for Diabetic Management?  No   Diabetic  Exams:  Diabetic Eye Exam: Completed 06/2021 Diabetic Foot Exam: Overdue, Pt has been advised about the importance in completing this exam. Pt is scheduled for diabetic foot exam on next office visit .   Interpreter Needed?: No  Information entered by :: Jadene Pierini, LPN   Activities of Daily Living    08/01/2022   10:42 AM 05/14/2022    1:20 PM  In your present state of health, do you have any difficulty performing the following activities:  Hearing? 0   Vision? 0   Difficulty concentrating or making decisions? 0   Walking or climbing stairs? 0   Dressing or bathing? 0   Doing errands, shopping? 0 0  Preparing Food and eating ? N   Using the Toilet? N   In the past six months, have you accidently leaked urine? N   Do you have problems with loss of bowel control? N   Managing your Medications? N   Managing your Finances? N   Housekeeping or managing your Housekeeping? N     Patient Care Team: [name], [name], MD as PCP - General (Family Medicine) Satira Sark, MD as PCP - Cardiology (Cardiology) Rogene Houston, MD as Consulting Physician (Gastroenterology) Weber Cooks, MD as Referring Physician (Orthopedic Surgery) Lavera Guise, [facility] as Pharmacist (Family Medicine)  Indicate any recent Medical Services you may have received from other than [facility] providers in the past year (date may be approximate).     Assessment:   This is a routine wellness examination for Elissia.  Hearing/Vision screen Vision Screening - Comments:: Wears rx glasses - up to date with routine eye exams with  Dr.johnson   Dietary issues and exercise activities discussed: Current Exercise Habits: Home exercise routine, Type of exercise: walking, Time (Minutes): 30, Frequency (Times/Week): 3, Weekly Exercise (Minutes/Week): 90, Intensity: Mild, Exercise limited by: None identified   Goals Addressed             This Visit's Progress    Exercise 3x per week (30 min per time)          Depression Screen    08/01/2022   10:39 AM 05/07/2022   11:23 AM 02/26/2022    2:05 PM 11/27/2021   11:20 AM 08/22/2021    3:39 PM 08/22/2021    3:36 PM 07/23/2021    9:42 AM  PHQ 2/9 Scores  PHQ - 2 Score 0 _0 0 0  PHQ- 9 Score  _1 0 0    Fall Risk    08/01/2022   10:37 AM 05/07/2022   11:23 AM 02/26/2022    2:05 PM 11/27/2021   11:20 AM 08/22/2021    3:36 PM  Fall Risk   Falls in the past year? 0 0 0 0 0  Number falls in past yr: 0      Injury with Fall? 0      Risk for fall due to : No Fall Risks      Follow up Falls prevention discussed        FALL RISK PREVENTION PERTAINING TO THE HOME:  Any stairs in or around the home? Yes  If so, are there any without handrails? No  Home free of loose throw rugs in walkways,  pet beds, electrical cords, etc? Yes  Adequate lighting in your home to reduce risk of falls? Yes   ASSISTIVE DEVICES UTILIZED TO PREVENT FALLS:  Life alert? Yes  Use of a cane, walker or w/c? No  Grab bars in the bathroom? No  Shower chair or bench in shower? No  Elevated toilet seat or a handicapped toilet? No       03/29/2018    9:22 AM  MMSE - Mini Mental State Exam  Orientation to time 5  Orientation to Place 5  Registration 3  Attention/ Calculation 5  Recall 2  Language- name 2 objects 2  Language- repeat 1  Language- follow 3 step command 3  Language- read & follow direction 1  Write a sentence 1  Copy design 1  Total score 29        08/01/2022   10:43 AM 04/06/2020    1:42 PM 04/06/2019    2:44 PM  6CIT Screen  What Year? 0 points 0 points 0 points  What month? 0 points 0 points 0 points  What time? 0 points 0 points 0 points  Count back from 20 0 points 0 points 0 points  Months in reverse 0 points 0 points 0 points  Repeat phrase 0 points 0 points 0 points  Total Score 0 points 0 points 0 points    Immunizations Immunization History  Administered Date(s) Administered   Influenza,inj,Quad PF,6+ Mos  06/06/2013   Pneumococcal Conjugate-13 03/29/2018   Pneumococcal Polysaccharide-23 09/12/2013   Tdap 10/02/2014    TDAP status: Up to date  Flu Vaccine status: Due, Education has been provided regarding the importance of this vaccine. Advised may receive this vaccine at local pharmacy or Health Dept. Aware to provide a copy of the vaccination record if obtained from local pharmacy or Health Dept. Verbalized acceptance and understanding.  Pneumococcal vaccine status: Up to date  Covid-19 vaccine status: Completed vaccines  Qualifies for Shingles Vaccine? Yes   Zostavax completed No   Shingrix Completed?: No.    Education has been provided regarding the importance of this vaccine. Patient has been advised to call insurance company to determine out of pocket expense if they have not yet received this vaccine. Advised may also receive vaccine at local pharmacy or Health Dept. Verbalized acceptance and understanding.  Screening Tests Health Maintenance  Topic Date Due   Lung Cancer Screening  12/09/2011   OPHTHALMOLOGY EXAM  06/24/2022   Diabetic kidney evaluation - Urine ACR  08/22/2022   MAMMOGRAM  08/22/2022 (Originally 04/18/2017)   COLON CANCER SCREENING ANNUAL FOBT  11/05/2022 (Originally 03/27/2022)   INFLUENZA VACCINE  11/09/2022 (Originally 03/11/2022)   HIV Screening  11/28/2022 (Originally 06/16/1973)   COVID-19 Vaccine (1) 12/22/2022 (Originally 06/17/1963)   Zoster Vaccines- Shingrix (1 of 2) 12/22/2022 (Originally 06/16/1977)   DEXA SCAN  06/17/2023 (Originally 03/30/2019)   PAP SMEAR-Modifier  09/17/2023 (Originally 06/17/1979)   FOOT EXAM  08/22/2022   HEMOGLOBIN A1C  11/03/2022   Diabetic kidney evaluation - eGFR measurement  05/16/2023   Medicare Annual Wellness (AWV)  08/02/2023   DTaP/Tdap/Td (2 - Td or Tdap) 10/02/2024   COLONOSCOPY (Pts 45-31yr Insurance coverage will need to be confirmed)  08/01/2025   Hepatitis C Screening  Completed   HPV VACCINES  Aged Out     Health Maintenance  Health Maintenance Due  Topic Date Due   Lung Cancer Screening  12/09/2011   OPHTHALMOLOGY EXAM  06/24/2022   Diabetic kidney evaluation - Urine ACR  08/22/2022    Colorectal cancer screening: Referral to GI placed declined . Pt aware the office will call re: appt.  Mammogram status: Ordered declined . Pt provided with contact info and advised to call to schedule appt.   Bone Density status: Ordered not of age . Pt provided with contact info and advised to call to schedule appt.  Lung Cancer Screening: (Low Dose CT Chest recommended if Age 85-80 years, 30 pack-year currently smoking OR have quit w/in 15years.) does not qualify.   Lung Cancer Screening Referral: n/a  Additional Screening:  Hepatitis C Screening: does not qualify;   Vision Screening: Recommended annual ophthalmology exams for early detection of glaucoma and other disorders of the eye. Is the patient up to date with their annual eye exam?  Yes  Who is the provider or what is the name of the office in which the patient attends annual eye exams? [facility]  If pt is not established with a provider, would they like to be referred to a provider to establish care? No .   Dental Screening: Recommended annual dental exams for proper oral hygiene  Community Resource Referral / Chronic Care Management: CRR required this visit?  No   CCM required this visit?  No      Plan:     I have personally reviewed and noted the following in the patient's chart:   Medical and social history Use of alcohol, tobacco or illicit drugs  Current medications and supplements including opioid prescriptions. Patient is not currently taking opioid prescriptions. Functional ability and status Nutritional status Physical activity Advanced directives List of other physicians Hospitalizations, surgeries, and ER visits in previous 12 months Vitals Screenings to include cognitive, depression, and  falls Referrals and appointments  In addition, I have reviewed and discussed with patient certain preventive protocols, quality metrics, and best practice recommendations. A written personalized care plan for preventive services as well as general preventive health recommendations were provided to patient.     Daphane Shepherd, LPN   37/29/0211   Nurse Notes: Due mammogram , Colonoscopy

## 2022-08-06 ENCOUNTER — Ambulatory Visit (INDEPENDENT_AMBULATORY_CARE_PROVIDER_SITE_OTHER): Payer: Medicare HMO | Admitting: *Deleted

## 2022-08-06 DIAGNOSIS — E538 Deficiency of other specified B group vitamins: Secondary | ICD-10-CM | POA: Diagnosis not present

## 2022-08-07 DIAGNOSIS — Z4789 Encounter for other orthopedic aftercare: Secondary | ICD-10-CM | POA: Diagnosis not present

## 2022-08-07 DIAGNOSIS — M25642 Stiffness of left hand, not elsewhere classified: Secondary | ICD-10-CM | POA: Diagnosis not present

## 2022-08-09 DIAGNOSIS — H02885 Meibomian gland dysfunction left lower eyelid: Secondary | ICD-10-CM | POA: Diagnosis not present

## 2022-08-09 DIAGNOSIS — H02882 Meibomian gland dysfunction right lower eyelid: Secondary | ICD-10-CM | POA: Diagnosis not present

## 2022-08-09 DIAGNOSIS — H52223 Regular astigmatism, bilateral: Secondary | ICD-10-CM | POA: Diagnosis not present

## 2022-08-09 DIAGNOSIS — H2513 Age-related nuclear cataract, bilateral: Secondary | ICD-10-CM | POA: Diagnosis not present

## 2022-08-09 DIAGNOSIS — H5213 Myopia, bilateral: Secondary | ICD-10-CM | POA: Diagnosis not present

## 2022-08-09 DIAGNOSIS — H524 Presbyopia: Secondary | ICD-10-CM | POA: Diagnosis not present

## 2022-08-09 DIAGNOSIS — H04123 Dry eye syndrome of bilateral lacrimal glands: Secondary | ICD-10-CM | POA: Diagnosis not present

## 2022-08-09 DIAGNOSIS — E119 Type 2 diabetes mellitus without complications: Secondary | ICD-10-CM | POA: Diagnosis not present

## 2022-08-12 DIAGNOSIS — Z5189 Encounter for other specified aftercare: Secondary | ICD-10-CM | POA: Diagnosis not present

## 2022-08-13 ENCOUNTER — Encounter: Payer: Self-pay | Admitting: Family Medicine

## 2022-08-13 ENCOUNTER — Ambulatory Visit (INDEPENDENT_AMBULATORY_CARE_PROVIDER_SITE_OTHER): Payer: Medicare HMO | Admitting: Family Medicine

## 2022-08-13 VITALS — BP 110/67 | HR 78 | Temp 98.3°F | Ht 70.0 in | Wt 214.0 lb

## 2022-08-13 DIAGNOSIS — K219 Gastro-esophageal reflux disease without esophagitis: Secondary | ICD-10-CM | POA: Diagnosis not present

## 2022-08-13 DIAGNOSIS — E782 Mixed hyperlipidemia: Secondary | ICD-10-CM

## 2022-08-13 DIAGNOSIS — J449 Chronic obstructive pulmonary disease, unspecified: Secondary | ICD-10-CM

## 2022-08-13 DIAGNOSIS — E1169 Type 2 diabetes mellitus with other specified complication: Secondary | ICD-10-CM | POA: Diagnosis not present

## 2022-08-13 DIAGNOSIS — J4489 Other specified chronic obstructive pulmonary disease: Secondary | ICD-10-CM

## 2022-08-13 DIAGNOSIS — G35 Multiple sclerosis: Secondary | ICD-10-CM | POA: Diagnosis not present

## 2022-08-13 DIAGNOSIS — Z7984 Long term (current) use of oral hypoglycemic drugs: Secondary | ICD-10-CM | POA: Diagnosis not present

## 2022-08-13 LAB — BAYER DCA HB A1C WAIVED: HB A1C (BAYER DCA - WAIVED): 7 % — ABNORMAL HIGH (ref 4.8–5.6)

## 2022-08-13 MED ORDER — EMPAGLIFLOZIN 25 MG PO TABS: 25.0000 mg | ORAL_TABLET | Freq: Every day | ORAL | 3 refills | Status: DC

## 2022-08-13 MED ORDER — PANTOPRAZOLE SODIUM 40 MG PO TBEC: DELAYED_RELEASE_TABLET | ORAL | 3 refills | Status: DC

## 2022-08-13 MED ORDER — ROSUVASTATIN CALCIUM 5 MG PO TABS: 5.0000 mg | ORAL_TABLET | Freq: Every day | ORAL | 3 refills | Status: DC

## 2022-08-13 NOTE — Progress Notes (Signed)
BP 110/67   Pulse 78   Temp 98.3 F (36.8 C)   Ht _0  (1.778 m)   Wt 214 lb (97.1 kg)   SpO2 97%   BMI 30.71 kg/m    Subjective:   Patient ID: [name], female    DOB: 09-13-1955, 67 y.o.   MRN: [id number]  HPI: [name] is a 68 y.o. female presenting on 08/13/2022 for Medical Management of Chronic Issues, Diabetes, Hyperlipidemia, and Loss of Consciousness (Last week after receiving B12 inj in our office. )   HPI Type 2 diabetes mellitus Patient comes in today for recheck of his diabetes. Patient has been currently taking Jardiance and Ozempic. Patient is not currently on an ACE inhibitor/ARB. Patient has not seen an ophthalmologist this year. Patient denies any issues with their feet. The symptom started onset as an adult hyperlipidemia and COPD ARE RELATED TO DM   COPD Patient is coming in for COPD recheck today.  He is currently on albuterol and Symbicort.  He has a mild chronic cough but denies any major coughing spells or wheezing spells.  He has 1 nighttime symptoms per week and 1 daytime symptoms per week currently.   Hyperlipidemia Patient is coming in for recheck of his hyperlipidemia. The patient is currently taking Crestor and Zetia. They deny any issues with myalgias or history of liver damage from it. They deny any focal numbness or weakness or chest pain.   GERD Patient is currently on Protonix.  She denies any major symptoms or abdominal pain or belching or burping. She denies any blood in her stool or lightheadedness or dizziness.   Relevant past medical, surgical, family and social history reviewed and updated as indicated. Interim medical history since our last visit reviewed. Allergies and medications reviewed and updated.  Review of Systems  Constitutional:  Negative for chills and fever.  Eyes:  Negative for visual disturbance.  Respiratory:  Negative for chest tightness and shortness of breath.   Cardiovascular:  Negative for chest pain  and leg swelling.  Skin:  Negative for rash.  Neurological:  Negative for dizziness, light-headedness and headaches.  Psychiatric/Behavioral:  Negative for agitation and behavioral problems.   All other systems reviewed and are negative.   Per HPI unless specifically indicated above   Allergies as of 08/13/2022       Reactions   Fenofibrate Other (See Comments)   Flu-like    Penicillin G Other (See Comments)   Blisters in mouth   Pravastatin Other (See Comments)   "flu like symptoms"    Statins         Medication List        Accurate as of August 13, 2022  2:30 PM. If you have any questions, ask your nurse or doctor.          albuterol 108 (90 Base) MCG/ACT inhaler Commonly known as: Ventolin HFA Inhale 2 puffs into the lungs every 6 (six) hours as needed. For shortness of breath   albuterol (2.5 MG/3ML) 0.083% nebulizer solution Commonly known as: PROVENTIL Take 3 mLs (2.5 mg total) by nebulization every 6 (six) hours as needed for wheezing or shortness of breath.   citalopram 40 MG tablet Commonly known as: CELEXA TAKE ONE (1) TABLET EACH DAY   cycloSPORINE 0.05 % ophthalmic emulsion Commonly known as: RESTASIS Place 1 drop into both eyes 2 (two) times daily.   empagliflozin 25 MG Tabs tablet Commonly known as: Jardiance Take 1 tablet (25 mg  total) by mouth daily before breakfast.   ezetimibe 10 MG tablet Commonly known as: Zetia Take 1 tablet (10 mg total) by mouth daily.   gabapentin 600 MG tablet Commonly known as: NEURONTIN TAKE ONE (1) TABLET THREE (3) TIMES EACH DAY What changed:  how much to take how to take this when to take this reasons to take this additional instructions   HYDROcodone-acetaminophen 5-325 MG tablet Commonly known as: NORCO/VICODIN Take 1-2 tablets by mouth every 6 (six) hours as needed for moderate pain or severe pain.   methocarbamol 500 MG tablet Commonly known as: ROBAXIN Take 1 tablet (500 mg total) by mouth  every 6 (six) hours as needed for muscle spasms.   pantoprazole 40 MG tablet Commonly known as: PROTONIX TAKE ONE (1) TABLET EACH DAY   rosuvastatin 5 MG tablet Commonly known as: Crestor Take 1 tablet (5 mg total) by mouth daily. Takes one every other day What changed:  when to take this additional instructions   Semaglutide(0.25 or 0.5MG/DOS) 2 MG/3ML Sopn Inject 0.5 mg into the skin once a week. What changed: how much to take   Symbicort 160-4.5 MCG/ACT inhaler Generic drug: budesonide-formoterol USE 2 PUFFS TWICE DAILY         Objective:   BP 110/67   Pulse 78   Temp 98.3 F (36.8 C)   Ht _0  (1.778 m)   Wt 214 lb (97.1 kg)   SpO2 97%   BMI 30.71 kg/m   Wt Readings from Last 3 Encounters:  08/13/22 214 lb (97.1 kg)  08/01/22 215 lb (97.5 kg)  05/14/22 226 lb (102.5 kg)    Physical Exam Vitals and nursing note reviewed.  Constitutional:      General: She is not in acute distress.    Appearance: She is well-developed. She is not diaphoretic.  Eyes:     Conjunctiva/sclera: Conjunctivae normal.  Cardiovascular:     Rate and Rhythm: Normal rate and regular rhythm.     Heart sounds: Normal heart sounds. No murmur heard. Pulmonary:     Effort: Pulmonary effort is normal. No respiratory distress.     Breath sounds: Normal breath sounds. No wheezing.  Musculoskeletal:        General: No swelling or tenderness. Normal range of motion.  Skin:    General: Skin is warm and dry.     Findings: No rash.  Neurological:     Mental Status: She is alert and oriented to person, place, and time.     Coordination: Coordination normal.  Psychiatric:        Behavior: Behavior normal.       Assessment & Plan:   Problem List Items Addressed This Visit       Respiratory   COPD with chronic bronchitis (HCC)   COPD  GOLD II/ AB     Digestive   Gastroesophageal reflux disease without esophagitis     Endocrine   Type 2 diabetes mellitus with other specified  complication ([place]) - Primary   Relevant Orders   CBC with Differential/Platelet   CMP14+EGFR   Lipid panel   Bayer DCA Hb A1c Waived     Other   Mixed hyperlipidemia   Relevant Orders   CBC with Differential/Platelet   CMP14+EGFR   Lipid panel   Bayer DCA Hb A1c Waived    A1c looks decent at 7.0.  Blood pressure looks good.  No changes.  Follow up plan: Return in about 3 months (around 11/12/2022), or if symptoms  worsen or fail to improve, for Diabetes and hyperlipidemia recheck.  Counseling provided for all of the vaccine components Orders Placed This Encounter  Procedures   CBC with Differential/Platelet   CMP14+EGFR   Lipid panel   Bayer DCA Hb A1c Waived    Caryl Pina, MD [facility] 08/13/2022, 2:30 PM

## 2022-08-14 LAB — CBC WITH DIFFERENTIAL/PLATELET
Basophils Absolute: 0 10*3/uL (ref 0.0–0.2)
Basos: 1 %
EOS (ABSOLUTE): 0.1 10*3/uL (ref 0.0–0.4)
Eos: 2 %
Hematocrit: 41.7 % (ref 34.0–46.6)
Hemoglobin: 13.3 g/dL (ref 11.1–15.9)
Immature Grans (Abs): 0 10*3/uL (ref 0.0–0.1)
Immature Granulocytes: 0 %
Lymphocytes Absolute: 2.5 10*3/uL (ref 0.7–3.1)
Lymphs: 29 %
MCH: 26.5 pg — ABNORMAL LOW (ref 26.6–33.0)
MCHC: 31.9 g/dL (ref 31.5–35.7)
MCV: 83 fL (ref 79–97)
Monocytes Absolute: 0.4 10*3/uL (ref 0.1–0.9)
Monocytes: 5 %
Neutrophils Absolute: 5.6 10*3/uL (ref 1.4–7.0)
Neutrophils: 63 %
Platelets: 256 10*3/uL (ref 150–450)
RBC: 5.01 x10E6/uL (ref 3.77–5.28)
RDW: 13.4 % (ref 11.7–15.4)
WBC: 8.8 10*3/uL (ref 3.4–10.8)

## 2022-08-14 LAB — CMP14+EGFR
ALT: 9 IU/L (ref 0–32)
AST: 12 IU/L (ref 0–40)
Albumin/Globulin Ratio: 2 (ref 1.2–2.2)
Albumin: 3.9 g/dL (ref 3.9–4.9)
Alkaline Phosphatase: 94 IU/L (ref 44–121)
BUN/Creatinine Ratio: 9 — ABNORMAL LOW (ref 12–28)
BUN: 7 mg/dL — ABNORMAL LOW (ref 8–27)
Bilirubin Total: 0.3 mg/dL (ref 0.0–1.2)
CO2: 25 mmol/L (ref 20–29)
Calcium: 9 mg/dL (ref 8.7–10.3)
Chloride: 104 mmol/L (ref 96–106)
Creatinine, Ser: 0.74 mg/dL (ref 0.57–1.00)
Globulin, Total: 2 g/dL (ref 1.5–4.5)
Glucose: 130 mg/dL — ABNORMAL HIGH (ref 70–99)
Potassium: 4 mmol/L (ref 3.5–5.2)
Sodium: 143 mmol/L (ref 134–144)
Total Protein: 5.9 g/dL — ABNORMAL LOW (ref 6.0–8.5)
eGFR: 90 mL/min/{1.73_m2} (ref >59)

## 2022-08-14 LAB — LIPID PANEL
Chol/HDL Ratio: 3 ratio (ref 0.0–4.4)
Cholesterol, Total: 142 mg/dL (ref 100–199)
HDL: 47 mg/dL (ref >39)
LDL Chol Calc (NIH): 72 mg/dL (ref 0–99)
Triglycerides: 130 mg/dL (ref 0–149)
VLDL Cholesterol Cal: 23 mg/dL (ref 5–40)

## 2022-08-21 DIAGNOSIS — M25642 Stiffness of left hand, not elsewhere classified: Secondary | ICD-10-CM | POA: Diagnosis not present

## 2022-08-25 DIAGNOSIS — M25642 Stiffness of left hand, not elsewhere classified: Secondary | ICD-10-CM | POA: Diagnosis not present

## 2022-08-26 ENCOUNTER — Other Ambulatory Visit (HOSPITAL_COMMUNITY): Payer: Self-pay

## 2022-08-26 NOTE — Telephone Encounter (Signed)
Submitted application for OZEMPIC to [place] for patient assistance.   Submitted application for [employer] to [place] [employer]) for patient assistance.

## 2022-09-03 ENCOUNTER — Telehealth: Payer: Self-pay | Admitting: Family Medicine

## 2022-09-04 DIAGNOSIS — Z4789 Encounter for other orthopedic aftercare: Secondary | ICD-10-CM | POA: Diagnosis not present

## 2022-09-04 DIAGNOSIS — M25642 Stiffness of left hand, not elsewhere classified: Secondary | ICD-10-CM | POA: Diagnosis not present

## 2022-09-05 NOTE — Telephone Encounter (Signed)
Left message requesting phone call to follow up on PAP documents needed.   Will need copy of tax return from previous year or income statement to submit to [employer].  [employer] wants patient to apply for LIS with social security.   Left call back number [phone] to discuss in detail.

## 2022-09-08 ENCOUNTER — Ambulatory Visit (INDEPENDENT_AMBULATORY_CARE_PROVIDER_SITE_OTHER): Payer: Medicare HMO | Admitting: *Deleted

## 2022-09-08 ENCOUNTER — Other Ambulatory Visit: Payer: Self-pay | Admitting: Family Medicine

## 2022-09-08 DIAGNOSIS — E538 Deficiency of other specified B group vitamins: Secondary | ICD-10-CM | POA: Diagnosis not present

## 2022-09-08 DIAGNOSIS — J449 Chronic obstructive pulmonary disease, unspecified: Secondary | ICD-10-CM

## 2022-09-08 NOTE — Patient Instructions (Signed)

## 2022-09-08 NOTE — Progress Notes (Signed)
Vitamin b12 injection given and patient tolerated well.  

## 2022-09-11 DIAGNOSIS — M25642 Stiffness of left hand, not elsewhere classified: Secondary | ICD-10-CM | POA: Diagnosis not present

## 2022-09-22 DIAGNOSIS — M25642 Stiffness of left hand, not elsewhere classified: Secondary | ICD-10-CM | POA: Diagnosis not present

## 2022-10-02 DIAGNOSIS — Z4789 Encounter for other orthopedic aftercare: Secondary | ICD-10-CM | POA: Diagnosis not present

## 2022-10-03 NOTE — Telephone Encounter (Signed)
Faxed proof of income to [employer].

## 2022-10-09 ENCOUNTER — Ambulatory Visit (INDEPENDENT_AMBULATORY_CARE_PROVIDER_SITE_OTHER): Payer: Medicare HMO

## 2022-10-09 ENCOUNTER — Telehealth: Payer: Self-pay

## 2022-10-09 DIAGNOSIS — E538 Deficiency of other specified B group vitamins: Secondary | ICD-10-CM | POA: Diagnosis not present

## 2022-10-09 NOTE — Progress Notes (Signed)
Cyanocobalamin injection given to right deltoid.  Patient tolerated well. 

## 2022-10-09 NOTE — Telephone Encounter (Signed)
Patient is out of her Ozempic and is still waiting to hear back about patient assistance.  Please advise.

## 2022-10-15 ENCOUNTER — Telehealth: Payer: Self-pay

## 2022-10-15 NOTE — Telephone Encounter (Signed)
Four boxes of Ozempic were received from patient assistance and placed in refrigerator for pick up.  Patient informed.

## 2022-10-16 NOTE — Telephone Encounter (Signed)
Received notification from Nathalie regarding approval for [place]. Patient assistance approved until 08/11/23.  Phone: [phone]

## 2022-11-03 ENCOUNTER — Encounter: Payer: Self-pay | Admitting: Family Medicine

## 2022-11-04 ENCOUNTER — Other Ambulatory Visit: Payer: Self-pay | Admitting: Family Medicine

## 2022-11-04 DIAGNOSIS — E782 Mixed hyperlipidemia: Secondary | ICD-10-CM

## 2022-11-06 ENCOUNTER — Ambulatory Visit (INDEPENDENT_AMBULATORY_CARE_PROVIDER_SITE_OTHER): Payer: Medicare HMO

## 2022-11-06 DIAGNOSIS — E538 Deficiency of other specified B group vitamins: Secondary | ICD-10-CM | POA: Diagnosis not present

## 2022-11-06 NOTE — Progress Notes (Signed)
Cyanocobalamin injection given to right deltoid.  Patient tolerated well. 

## 2022-11-12 NOTE — Patient Instructions (Signed)
Our records indicate that you are due for your annual mammogram/breast imaging. While there is no way to prevent breast cancer, early detection provides the best opportunity for curing it. For women over the age of 40, the American Cancer Society recommends a yearly clinical breast exam and a yearly mammogram. These practices have saved thousands of lives. We need your help to ensure that you are receiving optimal medical care. Please call the imaging location that has done you previous mammograms. Please remember to list us as your primary care. This helps make sure we receive a report and can update your chart.  Below is the contact information for several local breast imaging centers. You may call the location that works best for you, and they will be happy to assistance in making you an appointment. You do not need an order for a regular screening mammogram. However, if you are having any problems or concerns with you breast area, please let your primary care provider know, and appropriate orders will be placed. Please let our office know if you have any questions or concerns. Or if you need information for another imaging center not on this list or outside of the area. We are commented to working with you on your health care journey.   The mobile unit/bus ([facility]) - they come twice a month to our location.  These appointments can be made through our office or by call The Breast Center  [facility]  [address] [place], [place] [address] Phone [phone]  [facility] Radiology Department  [address]  [place], [place] [address] [phone]  [facility]  [address] [place], [place] [address] [phone]  [facility] [place]  [address] [place] [address] [phone]  [facility]  [address], [address] [place] [address] [phone]  [facility] [place]  [address] [place], [place] [address] [phone]  [facility] [address] [place], [place] [address] [phone]  [facility] [address]  [address] [place], [place] [address] [phone]  [facility] [address] [place] [address] [phone]     

## 2022-11-13 ENCOUNTER — Encounter: Payer: Self-pay | Admitting: Family Medicine

## 2022-11-13 ENCOUNTER — Ambulatory Visit (INDEPENDENT_AMBULATORY_CARE_PROVIDER_SITE_OTHER): Payer: Medicare HMO | Admitting: Family Medicine

## 2022-11-13 VITALS — BP 109/65 | HR 61 | Ht 70.0 in | Wt 211.0 lb

## 2022-11-13 DIAGNOSIS — J449 Chronic obstructive pulmonary disease, unspecified: Secondary | ICD-10-CM | POA: Diagnosis not present

## 2022-11-13 DIAGNOSIS — Z7985 Long-term (current) use of injectable non-insulin antidiabetic drugs: Secondary | ICD-10-CM | POA: Diagnosis not present

## 2022-11-13 DIAGNOSIS — E1140 Type 2 diabetes mellitus with diabetic neuropathy, unspecified: Secondary | ICD-10-CM

## 2022-11-13 DIAGNOSIS — J4489 Other specified chronic obstructive pulmonary disease: Secondary | ICD-10-CM

## 2022-11-13 DIAGNOSIS — E782 Mixed hyperlipidemia: Secondary | ICD-10-CM

## 2022-11-13 DIAGNOSIS — E1169 Type 2 diabetes mellitus with other specified complication: Secondary | ICD-10-CM | POA: Diagnosis not present

## 2022-11-13 DIAGNOSIS — F17210 Nicotine dependence, cigarettes, uncomplicated: Secondary | ICD-10-CM

## 2022-11-13 LAB — BAYER DCA HB A1C WAIVED: HB A1C (BAYER DCA - WAIVED): 6.5 % — ABNORMAL HIGH (ref 4.8–5.6)

## 2022-11-13 NOTE — Progress Notes (Signed)
BP 109/65   Pulse 61   Ht 5\' 10"  (1.778 m)   Wt 211 lb (95.7 kg)   SpO2 95%   BMI 30.28 kg/m    Subjective:   Patient ID: [name], female    DOB: 28-Feb-1956, 67 y.o.   MRN: [id number]  HPI: [name] is a 67 y.o. female presenting on 11/13/2022 for Medical Management of Chronic Issues, Diabetes, and Hyperlipidemia   HPI Type 2 diabetes mellitus Patient comes in today for recheck of his diabetes. Patient has been currently taking Ozempic and Jardiance. Patient is not currently on an ACE inhibitor/ARB. Patient has not seen an ophthalmologist this year. Patient denies any new issues with their feet. The symptom started onset as an adult neuropathy and hyperlipidemia ARE RELATED TO DM   COPD Patient is coming in for COPD recheck today.  He is currently on albuterol and Symbicort.  He has a mild chronic cough but denies any major coughing spells or wheezing spells.  He has 1nighttime symptoms per week and 3daytime symptoms per week currently.  During pollen season she has picked up a little bit but she seems to be doing okay right now.  Hyperlipidemia Patient is coming in for recheck of his hyperlipidemia. The patient is currently taking Zetia and Crestor. They deny any issues with myalgias or history of liver damage from it. They deny any focal numbness or weakness or chest pain.   Relevant past medical, surgical, family and social history reviewed and updated as indicated. Interim medical history since our last visit reviewed. Allergies and medications reviewed and updated.  Review of Systems  Constitutional:  Negative for chills and fever.  Eyes:  Negative for visual disturbance.  Respiratory:  Negative for chest tightness and shortness of breath.   Cardiovascular:  Negative for chest pain and leg swelling.  Musculoskeletal:  Negative for back pain and gait problem.  Skin:  Negative for rash.  Neurological:  Negative for dizziness, light-headedness and headaches.   Psychiatric/Behavioral:  Negative for agitation and behavioral problems.   All other systems reviewed and are negative.   Per HPI unless specifically indicated above   Allergies as of 11/13/2022       Reactions   Fenofibrate Other (See Comments)   Flu-like    Penicillin G Other (See Comments)   Blisters in mouth   Pravastatin Other (See Comments)   "flu like symptoms"    Statins         Medication List        Accurate as of November 13, 2022 11:54 AM. If you have any questions, ask your nurse or doctor.          STOP taking these medications    HYDROcodone-acetaminophen 5-325 MG tablet Commonly known as: NORCO/VICODIN Stopped by: [name] Nazanin Kinner, MD   methocarbamol 500 MG tablet Commonly known as: ROBAXIN Stopped by: [name] Aika Brzoska, MD       TAKE these medications    albuterol 108 (90 Base) MCG/ACT inhaler Commonly known as: Ventolin HFA Inhale 2 puffs into the lungs every 6 (six) hours as needed. For shortness of breath   albuterol (2.5 MG/3ML) 0.083% nebulizer solution Commonly known as: PROVENTIL Take 3 mLs (2.5 mg total) by nebulization every 6 (six) hours as needed for wheezing or shortness of breath.   citalopram 40 MG tablet Commonly known as: CELEXA TAKE ONE (1) TABLET EACH DAY   clindamycin 150 MG capsule Commonly known as: CLEOCIN Take 150 mg  by mouth 3 (three) times daily.   cycloSPORINE 0.05 % ophthalmic emulsion Commonly known as: RESTASIS Place 1 drop into both eyes 2 (two) times daily.   empagliflozin 25 MG Tabs tablet Commonly known as: Jardiance Take 1 tablet (25 mg total) by mouth daily before breakfast.   ezetimibe 10 MG tablet Commonly known as: ZETIA TAKE ONE (1) TABLET BY MOUTH EVERY DAY   gabapentin 600 MG tablet Commonly known as: NEURONTIN TAKE ONE (1) TABLET THREE (3) TIMES EACH DAY What changed:  how much to take how to take this when to take this reasons to take this additional instructions    pantoprazole 40 MG tablet Commonly known as: PROTONIX TAKE ONE (1) TABLET EACH DAY   rosuvastatin 5 MG tablet Commonly known as: Crestor Take 1 tablet (5 mg total) by mouth daily. Takes one every other day   Semaglutide(0.25 or 0.5MG /DOS) 2 MG/3ML Sopn Inject 0.5 mg into the skin once a week. What changed: how much to take   Symbicort 160-4.5 MCG/ACT inhaler Generic drug: budesonide-formoterol USE 2 PUFFS TWICE DAILY         Objective:   BP 109/65   Pulse 61   Ht 5\' 10"  (1.778 m)   Wt 211 lb (95.7 kg)   SpO2 95%   BMI 30.28 kg/m   Wt Readings from Last 3 Encounters:  11/13/22 211 lb (95.7 kg)  08/13/22 214 lb (97.1 kg)  08/01/22 215 lb (97.5 kg)    Physical Exam Vitals and nursing note reviewed.  Constitutional:      General: She is not in acute distress.    Appearance: She is well-developed. She is not diaphoretic.  Eyes:     Conjunctiva/sclera: Conjunctivae normal.  Cardiovascular:     Rate and Rhythm: Normal rate and regular rhythm.     Heart sounds: Normal heart sounds. No murmur heard. Pulmonary:     Effort: Pulmonary effort is normal. No respiratory distress.     Breath sounds: Normal breath sounds. No wheezing.  Musculoskeletal:        General: No tenderness. Normal range of motion.  Skin:    General: Skin is warm and dry.     Findings: No rash.  Neurological:     Mental Status: She is alert and oriented to person, place, and time.     Coordination: Coordination normal.  Psychiatric:        Behavior: Behavior normal.     Diabetic Foot Exam - Simple   Simple Foot Form Diabetic Foot exam was performed with the following findings: Yes 11/13/2022 11:53 AM  Visual Inspection No deformities, no ulcerations, no other skin breakdown bilaterally: Yes Sensation Testing See comments: Yes Pulse Check Posterior Tibialis and Dorsalis pulse intact bilaterally: Yes Comments No fine touch sensation on any of her toes on both feet      Assessment &  Plan:   Problem List Items Addressed This Visit       Respiratory   COPD with chronic bronchitis ([place])   Relevant Orders   Ambulatory Referral for Lung Cancer Scre   COPD  GOLD II/ AB   Relevant Orders   Ambulatory Referral for Lung Cancer Scre     Endocrine   Type 2 diabetes mellitus with other specified complication - Primary   Relevant Orders   Bayer DCA Hb A1c Waived   Microalbumin / creatinine urine ratio     Other   Mixed hyperlipidemia   Cigarette smoker   Relevant Orders  Ambulatory Referral for Lung Cancer Scre   Other Visit Diagnoses     Smokes with greater than 30 pack year history       Relevant Orders   Ambulatory Referral for Lung Cancer Scre   Type 2 diabetes mellitus with diabetic neuropathy, without long-term current use of insulin           Blood pressure looks decent, A1c looks good at 6.5.  No changes Follow up plan: Return in about 3 months (around 02/12/2023), or if symptoms worsen or fail to improve, for Diabetes recheck.  Counseling provided for all of the vaccine components Orders Placed This Encounter  Procedures   Bayer DCA Hb A1c Waived   Microalbumin / creatinine urine ratio   Ambulatory Referral for Lung Cancer Scre    Caryl Pina, MD [facility] 11/13/2022, 11:54 AM

## 2022-11-18 ENCOUNTER — Other Ambulatory Visit: Payer: Self-pay | Admitting: Family Medicine

## 2022-11-18 DIAGNOSIS — J449 Chronic obstructive pulmonary disease, unspecified: Secondary | ICD-10-CM

## 2022-11-26 ENCOUNTER — Telehealth: Payer: Self-pay

## 2022-11-26 NOTE — Telephone Encounter (Signed)
Received notification from [employer]) regarding approval for [employer]. Patient assistance approved from 11/24/22 to 08/11/23.  Phone: [phone]

## 2022-11-26 NOTE — Telephone Encounter (Signed)
Received notification from [employer] regarding approval for OZEMPIC. Patient assistance approved until 08/11/23.  Medication will ship to office  Phone: [phone]

## 2022-12-08 ENCOUNTER — Ambulatory Visit: Payer: Medicare HMO

## 2022-12-10 ENCOUNTER — Ambulatory Visit (INDEPENDENT_AMBULATORY_CARE_PROVIDER_SITE_OTHER): Payer: Medicare HMO | Admitting: *Deleted

## 2022-12-10 DIAGNOSIS — E538 Deficiency of other specified B group vitamins: Secondary | ICD-10-CM | POA: Diagnosis not present

## 2022-12-10 NOTE — Progress Notes (Signed)
Pt in today for B12 left deltoid, tolerated well. 

## 2023-01-12 ENCOUNTER — Ambulatory Visit (INDEPENDENT_AMBULATORY_CARE_PROVIDER_SITE_OTHER): Payer: Medicare HMO

## 2023-01-12 DIAGNOSIS — E538 Deficiency of other specified B group vitamins: Secondary | ICD-10-CM | POA: Diagnosis not present

## 2023-01-12 NOTE — Progress Notes (Signed)
Cyanocobalamin injection given to right deltoid.  Patient tolerated well. 

## 2023-01-19 ENCOUNTER — Ambulatory Visit: Payer: Medicare HMO

## 2023-01-23 IMAGING — DX DG CHEST 2V
2 series · 2 of 2 positions shown · non-contrast
Comparison: X-ray chest 10/26/2018

CLINICAL DATA: Wheezing.

EXAM:
CHEST - 2 VIEW

[chest pa]
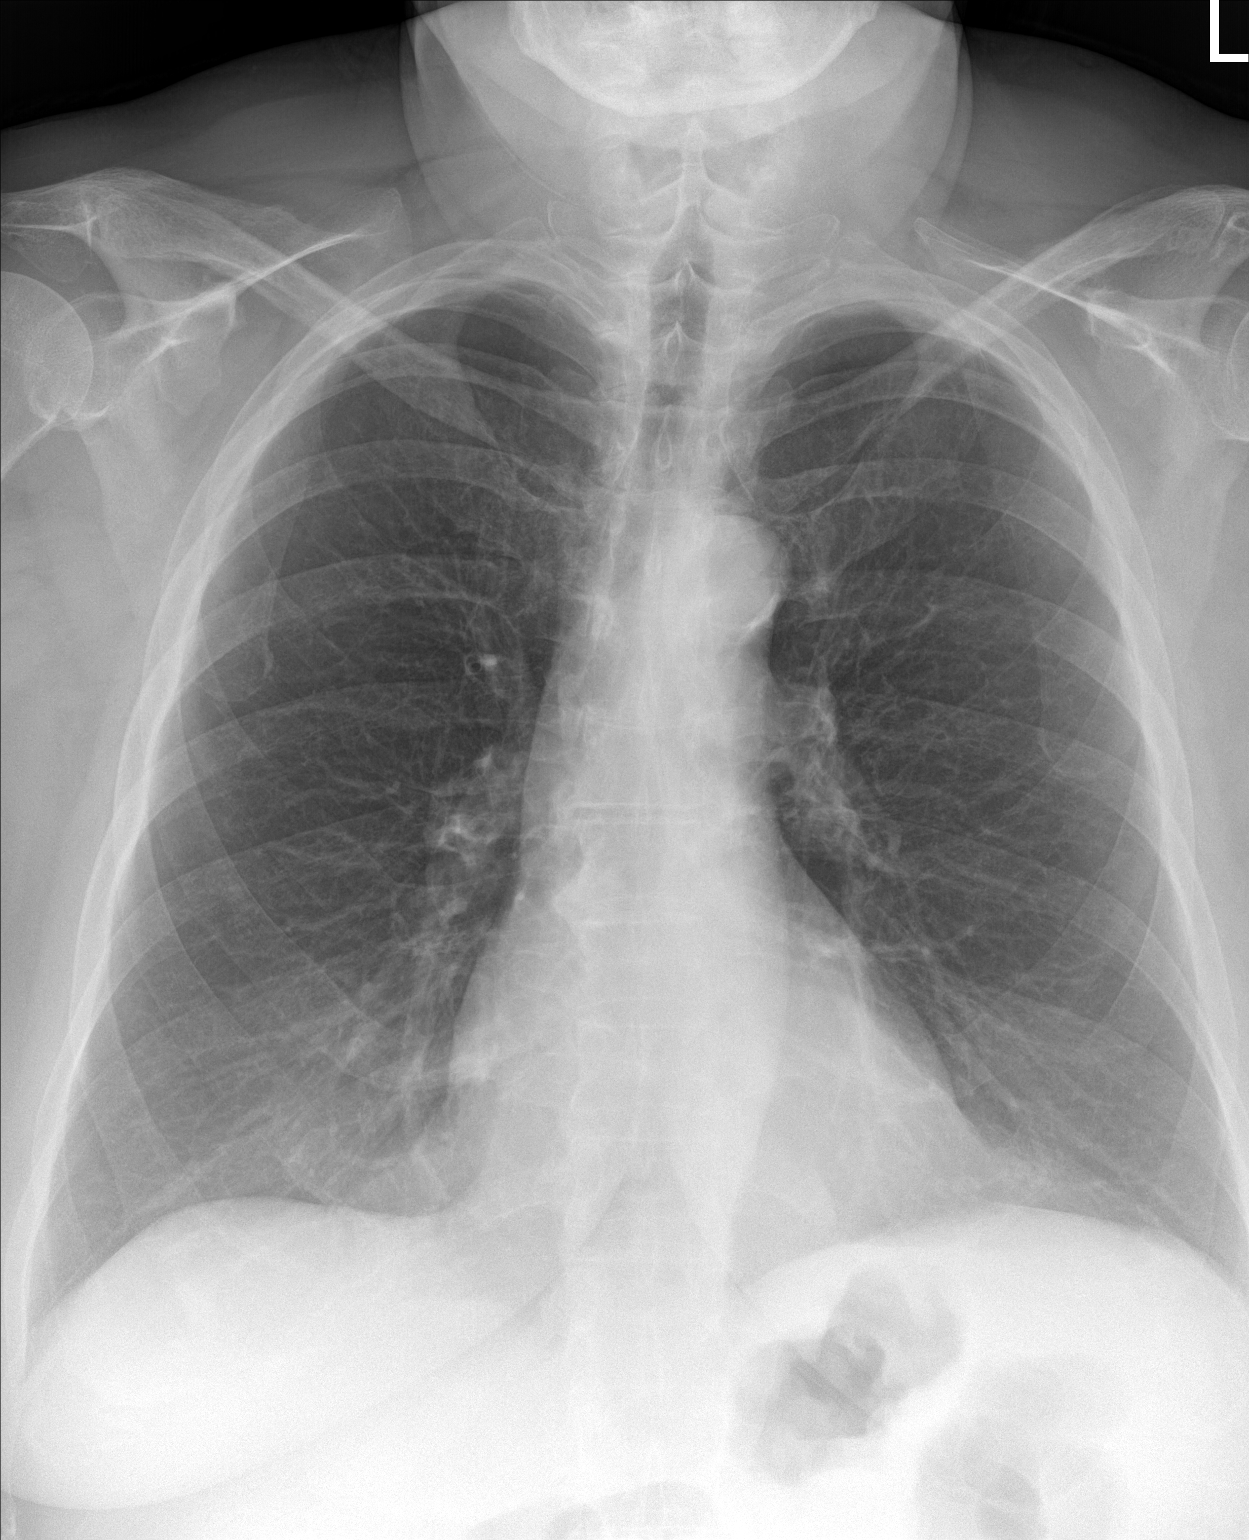

[chest lat]
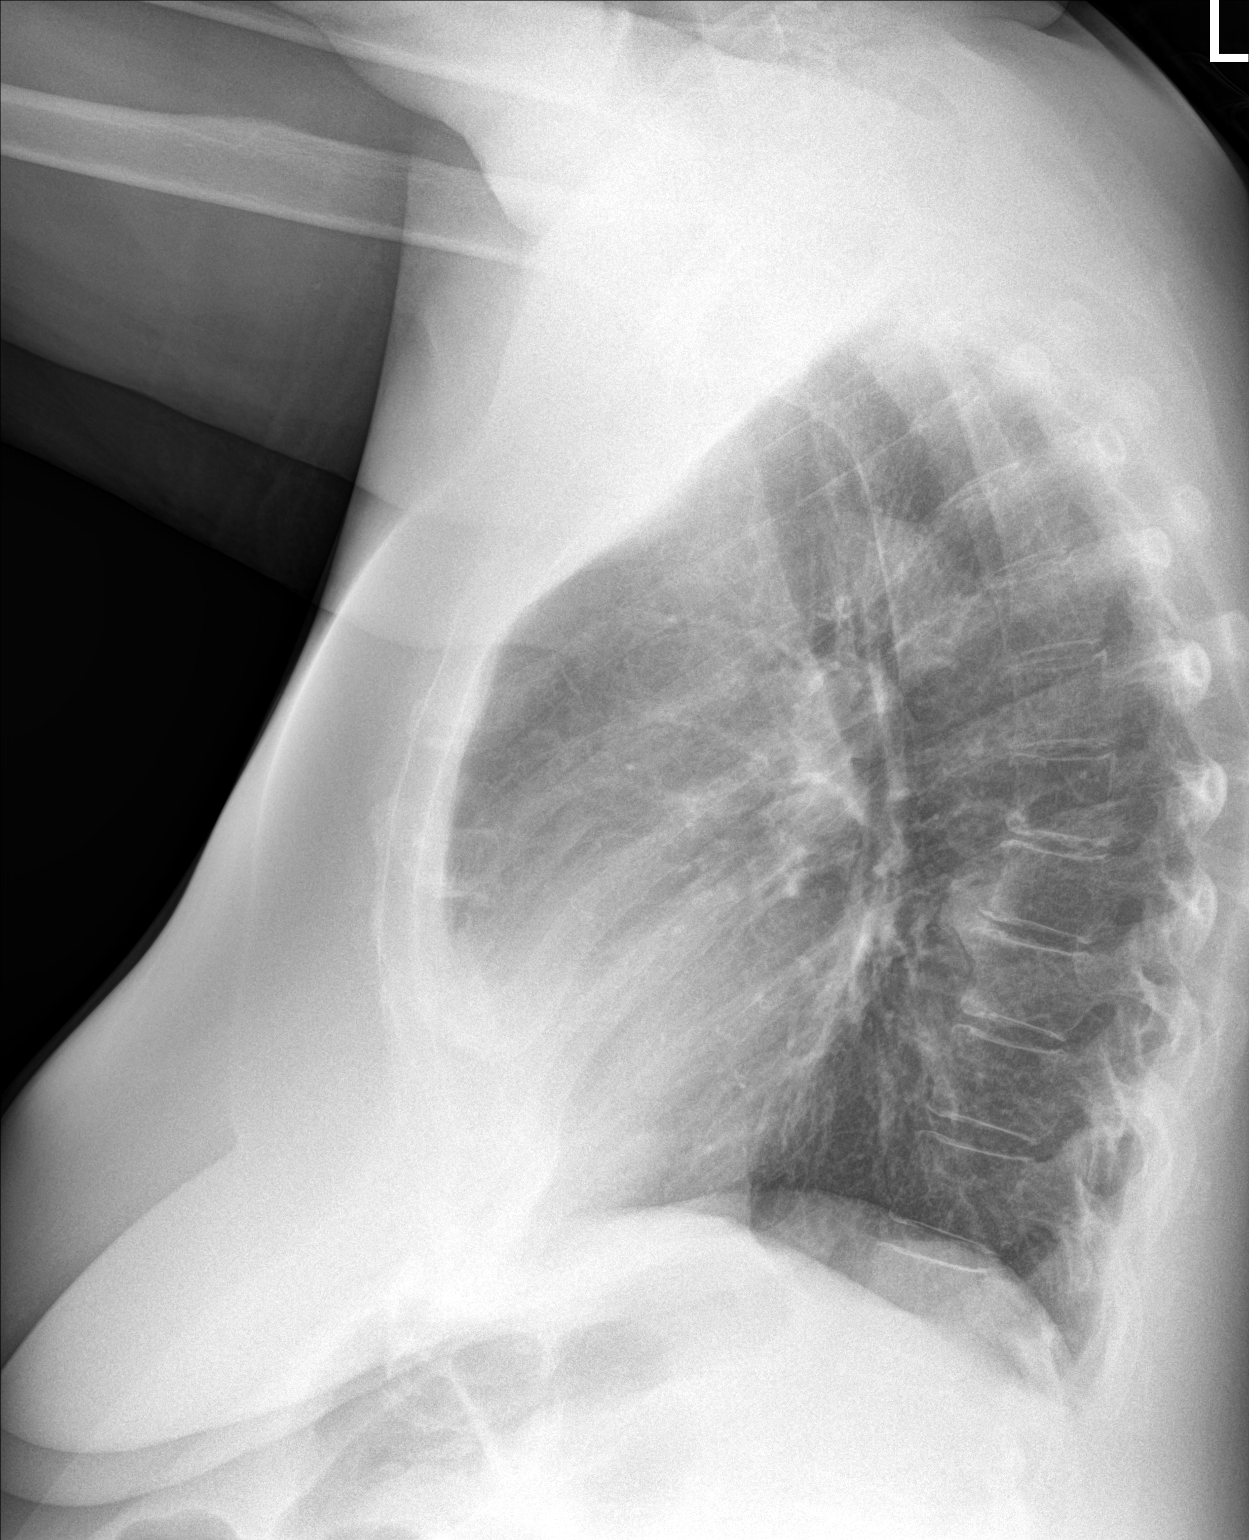

[2 of 2 positions shown; findings below may reference images not displayed]

FINDINGS: The heart size and mediastinal contours are within normal limits.
Both lungs are clear. The visualized skeletal structures are
unremarkable.
IMPRESSION: No active cardiopulmonary disease.

## 2023-02-02 ENCOUNTER — Telehealth: Payer: Self-pay | Admitting: Family Medicine

## 2023-02-02 NOTE — Telephone Encounter (Signed)
Pt has three boxes of Ozempic in lab fridge.  Pt made aware and will come by and pick it up.

## 2023-02-02 NOTE — Telephone Encounter (Signed)
Calling to see if we have received her Ozempic. She only has one dose left for next Monday.

## 2023-02-05 ENCOUNTER — Other Ambulatory Visit: Payer: Self-pay | Admitting: Family Medicine

## 2023-02-05 DIAGNOSIS — E782 Mixed hyperlipidemia: Secondary | ICD-10-CM

## 2023-02-16 ENCOUNTER — Other Ambulatory Visit: Payer: Self-pay | Admitting: Family Medicine

## 2023-02-16 ENCOUNTER — Ambulatory Visit (INDEPENDENT_AMBULATORY_CARE_PROVIDER_SITE_OTHER): Payer: Medicare HMO

## 2023-02-16 DIAGNOSIS — E538 Deficiency of other specified B group vitamins: Secondary | ICD-10-CM | POA: Diagnosis not present

## 2023-02-16 DIAGNOSIS — F411 Generalized anxiety disorder: Secondary | ICD-10-CM

## 2023-02-16 NOTE — Progress Notes (Signed)
Cyanocobalamin injection given to left deltoid.  Patient tolerated well. 

## 2023-02-18 ENCOUNTER — Encounter: Payer: Self-pay | Admitting: Family Medicine

## 2023-02-18 ENCOUNTER — Ambulatory Visit (INDEPENDENT_AMBULATORY_CARE_PROVIDER_SITE_OTHER): Payer: Medicare HMO | Admitting: Family Medicine

## 2023-02-18 VITALS — BP 113/67 | HR 73 | Ht 70.0 in | Wt 210.0 lb

## 2023-02-18 DIAGNOSIS — F17210 Nicotine dependence, cigarettes, uncomplicated: Secondary | ICD-10-CM | POA: Diagnosis not present

## 2023-02-18 DIAGNOSIS — E1169 Type 2 diabetes mellitus with other specified complication: Secondary | ICD-10-CM | POA: Diagnosis not present

## 2023-02-18 DIAGNOSIS — J449 Chronic obstructive pulmonary disease, unspecified: Secondary | ICD-10-CM

## 2023-02-18 DIAGNOSIS — F411 Generalized anxiety disorder: Secondary | ICD-10-CM | POA: Diagnosis not present

## 2023-02-18 DIAGNOSIS — J4489 Other specified chronic obstructive pulmonary disease: Secondary | ICD-10-CM | POA: Diagnosis not present

## 2023-02-18 DIAGNOSIS — E782 Mixed hyperlipidemia: Secondary | ICD-10-CM | POA: Diagnosis not present

## 2023-02-18 LAB — BAYER DCA HB A1C WAIVED: HB A1C (BAYER DCA - WAIVED): 6.5 % — ABNORMAL HIGH (ref 4.8–5.6)

## 2023-02-18 MED ORDER — GABAPENTIN 600 MG PO TABS: ORAL_TABLET | ORAL | 3 refills | Status: DC

## 2023-02-18 MED ORDER — CITALOPRAM HYDROBROMIDE 40 MG PO TABS: ORAL_TABLET | ORAL | 3 refills | Status: DC

## 2023-02-18 MED ORDER — EZETIMIBE 10 MG PO TABS: 10.0000 mg | ORAL_TABLET | Freq: Every day | ORAL | 3 refills | Status: DC

## 2023-02-18 NOTE — Progress Notes (Signed)
BP 113/67   Pulse 73   Ht 5\' 10"  (1.778 m)   Wt 210 lb (95.3 kg)   SpO2 95%   BMI 30.13 kg/m    Subjective:   Patient ID: [name], female    DOB: 08-25-1955, 67 y.o.   MRN: [id number]  HPI: [name] is a 67 y.o. female presenting on 02/18/2023 for Medical Management of Chronic Issues, Diabetes, and Hyperlipidemia   HPI Type 2 diabetes mellitus Patient comes in today for recheck of his diabetes. Patient has been currently taking Jardiance and Ozempic. Patient is not currently on an ACE inhibitor/ARB. Patient has not seen an ophthalmologist this year. Patient denies any new issues with their feet. The symptom started onset as an adult hyperlipidemia and COPD ARE RELATED TO DM   Hyperlipidemia Patient is coming in for recheck of his hyperlipidemia. The patient is currently taking Crestor. They deny any issues with myalgias or history of liver damage from it. They deny any focal numbness or weakness or chest pain.   COPD Patient is coming in for COPD recheck today.  He is currently on albuterol and Symbicort.  He has a mild chronic cough but denies any major coughing spells or wheezing spells.  He has 0 nighttime symptoms per week and 0 daytime symptoms per week currently.  Her only complaint is some nasal congestion that she has been fighting where she feels like 1 side is stopped up.  She is using Nettie pot for it currently.  Relevant past medical, surgical, family and social history reviewed and updated as indicated. Interim medical history since our last visit reviewed. Allergies and medications reviewed and updated.  Review of Systems  Constitutional:  Negative for chills and fever.  HENT:  Positive for congestion. Negative for ear discharge and ear pain.   Eyes:  Negative for redness and visual disturbance.  Respiratory:  Positive for cough. Negative for chest tightness, shortness of breath and wheezing.   Cardiovascular:  Negative for chest pain and leg swelling.   Genitourinary:  Negative for difficulty urinating and dysuria.  Musculoskeletal:  Negative for back pain and gait problem.  Skin:  Negative for rash.  Neurological:  Negative for dizziness, light-headedness and headaches.  Psychiatric/Behavioral:  Negative for agitation and behavioral problems.   All other systems reviewed and are negative.   Per HPI unless specifically indicated above   Allergies as of 02/18/2023       Reactions   Fenofibrate Other (See Comments)   Flu-like    Penicillin G Other (See Comments)   Blisters in mouth   Pravastatin Other (See Comments)   "flu like symptoms"    Statins         Medication List        Accurate as of February 18, 2023 10:00 AM. If you have any questions, ask your nurse or doctor.          STOP taking these medications    clindamycin 150 MG capsule Commonly known as: CLEOCIN Stopped by: Elige Radon Yutaka Holberg, MD       TAKE these medications    albuterol 108 (90 Base) MCG/ACT inhaler Commonly known as: Ventolin HFA Inhale 2 puffs into the lungs every 6 (six) hours as needed. For shortness of breath   albuterol (2.5 MG/3ML) 0.083% nebulizer solution Commonly known as: PROVENTIL Take 3 mLs (2.5 mg total) by nebulization every 6 (six) hours as needed for wheezing or shortness of breath.   citalopram 40 MG  tablet Commonly known as: CELEXA TAKE ONE (1) TABLET BY MOUTH EVERY DAY   cycloSPORINE 0.05 % ophthalmic emulsion Commonly known as: RESTASIS Place 1 drop into both eyes 2 (two) times daily.   empagliflozin 25 MG Tabs tablet Commonly known as: Jardiance Take 1 tablet (25 mg total) by mouth daily before breakfast.   ezetimibe 10 MG tablet Commonly known as: ZETIA Take 1 tablet (10 mg total) by mouth daily. What changed: how much to take Changed by: Elige Radon Gerldine Suleiman, MD   gabapentin 600 MG tablet Commonly known as: NEURONTIN TAKE ONE (1) TABLET THREE (3) TIMES EACH DAY What changed:  how much to take how to  take this when to take this reasons to take this additional instructions   pantoprazole 40 MG tablet Commonly known as: PROTONIX TAKE ONE (1) TABLET EACH DAY   rosuvastatin 5 MG tablet Commonly known as: Crestor Take 1 tablet (5 mg total) by mouth daily. Takes one every other day   Semaglutide(0.25 or 0.5MG /DOS) 2 MG/3ML Sopn Inject 0.5 mg into the skin once a week. What changed: how much to take   Symbicort 160-4.5 MCG/ACT inhaler Generic drug: budesonide-formoterol USE 2 PUFFS TWICE DAILY         Objective:   BP 113/67   Pulse 73   Ht 5\' 10"  (1.778 m)   Wt 210 lb (95.3 kg)   SpO2 95%   BMI 30.13 kg/m   Wt Readings from Last 3 Encounters:  02/18/23 210 lb (95.3 kg)  11/13/22 211 lb (95.7 kg)  08/13/22 214 lb (97.1 kg)    Physical Exam Vitals and nursing note reviewed.  Constitutional:      General: She is not in acute distress.    Appearance: She is well-developed. She is not diaphoretic.  Eyes:     Conjunctiva/sclera: Conjunctivae normal.  Cardiovascular:     Rate and Rhythm: Normal rate and regular rhythm.     Heart sounds: Normal heart sounds. No murmur heard. Pulmonary:     Effort: Pulmonary effort is normal. No respiratory distress.     Breath sounds: Normal breath sounds. No wheezing.  Musculoskeletal:        General: No swelling or tenderness. Normal range of motion.  Skin:    General: Skin is warm and dry.     Findings: No rash.  Neurological:     Mental Status: She is alert and oriented to person, place, and time.     Coordination: Coordination normal.  Psychiatric:        Behavior: Behavior normal.       Assessment & Plan:   Problem List Items Addressed This Visit       Respiratory   COPD with chronic bronchitis (HCC) - Primary   Relevant Orders   Ambulatory Referral for Lung Cancer Scre   COPD  GOLD II/ AB   Relevant Orders   Ambulatory Referral for Lung Cancer Scre     Endocrine   Type 2 diabetes mellitus with other  specified complication (HCC)   Relevant Medications   citalopram (CELEXA) 40 MG tablet   ezetimibe (ZETIA) 10 MG tablet   gabapentin (NEURONTIN) 600 MG tablet   Other Relevant Orders   CBC with Differential/Platelet   CMP14+EGFR   Lipid panel   Bayer DCA Hb A1c Waived   Microalbumin / creatinine urine ratio     Other   Mixed hyperlipidemia   Relevant Medications   ezetimibe (ZETIA) 10 MG tablet   Generalized anxiety disorder  Relevant Medications   citalopram (CELEXA) 40 MG tablet   Other Visit Diagnoses     Smokes less than 1 pack a day with greater than 20 pack year history       Relevant Orders   Ambulatory Referral for Lung Cancer Scre       A1c 6.5, same as last time, no changes.  Patient has 40-pack-year history, currently only smoking a fourth of a pack but has been smoking almost a pack per day since she was 15. Follow up plan: Return in about 3 months (around 05/21/2023), or if symptoms worsen or fail to improve, for Diabetes recheck.  Counseling provided for all of the vaccine components Orders Placed This Encounter  Procedures   CBC with Differential/Platelet   CMP14+EGFR   Lipid panel   Bayer DCA Hb A1c Waived   Microalbumin / creatinine urine ratio   Ambulatory Referral for Lung Cancer Scre    Arville Care, MD [place] [facility] 02/18/2023, 10:00 AM

## 2023-02-19 LAB — CBC WITH DIFFERENTIAL/PLATELET
Basophils Absolute: 0.1 10*3/uL (ref 0.0–0.2)
Basos: 1 %
EOS (ABSOLUTE): 0.1 10*3/uL (ref 0.0–0.4)
Eos: 2 %
Hematocrit: 41.5 % (ref 34.0–46.6)
Hemoglobin: 13.8 g/dL (ref 11.1–15.9)
Immature Grans (Abs): 0 10*3/uL (ref 0.0–0.1)
Immature Granulocytes: 0 %
Lymphocytes Absolute: 2.1 10*3/uL (ref 0.7–3.1)
Lymphs: 25 %
MCH: 28.2 pg (ref 26.6–33.0)
MCHC: 33.3 g/dL (ref 31.5–35.7)
MCV: 85 fL (ref 79–97)
Monocytes Absolute: 0.4 10*3/uL (ref 0.1–0.9)
Monocytes: 5 %
Neutrophils Absolute: 5.6 10*3/uL (ref 1.4–7.0)
Neutrophils: 67 %
Platelets: 248 10*3/uL (ref 150–450)
RBC: 4.89 x10E6/uL (ref 3.77–5.28)
RDW: 13.2 % (ref 11.7–15.4)
WBC: 8.2 10*3/uL (ref 3.4–10.8)

## 2023-02-19 LAB — CMP14+EGFR
ALT: 11 IU/L (ref 0–32)
AST: 12 IU/L (ref 0–40)
Albumin: 3.8 g/dL — ABNORMAL LOW (ref 3.9–4.9)
Alkaline Phosphatase: 82 IU/L (ref 44–121)
BUN/Creatinine Ratio: 12 (ref 12–28)
BUN: 10 mg/dL (ref 8–27)
Bilirubin Total: 0.5 mg/dL (ref 0.0–1.2)
CO2: 25 mmol/L (ref 20–29)
Calcium: 9.1 mg/dL (ref 8.7–10.3)
Chloride: 104 mmol/L (ref 96–106)
Creatinine, Ser: 0.84 mg/dL (ref 0.57–1.00)
Globulin, Total: 1.9 g/dL (ref 1.5–4.5)
Glucose: 124 mg/dL — ABNORMAL HIGH (ref 70–99)
Potassium: 4.1 mmol/L (ref 3.5–5.2)
Sodium: 142 mmol/L (ref 134–144)
Total Protein: 5.7 g/dL — ABNORMAL LOW (ref 6.0–8.5)
eGFR: 78 mL/min/{1.73_m2} (ref >59)

## 2023-02-19 LAB — LIPID PANEL
Chol/HDL Ratio: 2.9 ratio (ref 0.0–4.4)
Cholesterol, Total: 156 mg/dL (ref 100–199)
HDL: 53 mg/dL (ref >39)
LDL Chol Calc (NIH): 87 mg/dL (ref 0–99)
Triglycerides: 84 mg/dL (ref 0–149)
VLDL Cholesterol Cal: 16 mg/dL (ref 5–40)

## 2023-02-19 LAB — MICROALBUMIN / CREATININE URINE RATIO
Creatinine, Urine: 62.4 mg/dL
Microalb/Creat Ratio: <5 mg/g creat (ref 0–29)
Microalbumin, Urine: <3 ug/mL

## 2023-02-27 ENCOUNTER — Encounter: Payer: Self-pay | Admitting: *Deleted

## 2023-03-19 ENCOUNTER — Ambulatory Visit (INDEPENDENT_AMBULATORY_CARE_PROVIDER_SITE_OTHER): Payer: Medicare HMO

## 2023-03-19 DIAGNOSIS — E538 Deficiency of other specified B group vitamins: Secondary | ICD-10-CM

## 2023-03-19 DIAGNOSIS — D518 Other vitamin B12 deficiency anemias: Secondary | ICD-10-CM

## 2023-03-19 NOTE — Progress Notes (Signed)
Cyanocobalamin injection given to right deltoid.  Patient tolerated well. 

## 2023-04-07 DIAGNOSIS — M542 Cervicalgia: Secondary | ICD-10-CM | POA: Diagnosis not present

## 2023-04-07 DIAGNOSIS — M25512 Pain in left shoulder: Secondary | ICD-10-CM | POA: Diagnosis not present

## 2023-04-16 ENCOUNTER — Other Ambulatory Visit: Payer: Self-pay | Admitting: *Deleted

## 2023-04-16 DIAGNOSIS — F17210 Nicotine dependence, cigarettes, uncomplicated: Secondary | ICD-10-CM

## 2023-04-16 DIAGNOSIS — Z87891 Personal history of nicotine dependence: Secondary | ICD-10-CM

## 2023-04-16 DIAGNOSIS — Z122 Encounter for screening for malignant neoplasm of respiratory organs: Secondary | ICD-10-CM

## 2023-04-17 ENCOUNTER — Ambulatory Visit (INDEPENDENT_AMBULATORY_CARE_PROVIDER_SITE_OTHER): Payer: Medicare HMO

## 2023-04-17 DIAGNOSIS — E538 Deficiency of other specified B group vitamins: Secondary | ICD-10-CM

## 2023-04-17 NOTE — Progress Notes (Signed)
Cyanocobalamin injection given to left deltoid.  Patient tolerated well. 

## 2023-05-01 ENCOUNTER — Telehealth: Payer: Self-pay | Admitting: Pharmacist

## 2023-05-01 DIAGNOSIS — E1169 Type 2 diabetes mellitus with other specified complication: Secondary | ICD-10-CM

## 2023-05-01 MED ORDER — SEMAGLUTIDE (1 MG/DOSE) 4 MG/3ML ~~LOC~~ SOPN: 1.0000 mg | PEN_INJECTOR | SUBCUTANEOUS | Status: DC

## 2023-05-01 NOTE — Telephone Encounter (Signed)
   Patient enrolled in the [employer] PAP for Ozempic.  4 month supply arrived to PCP office.  Patient notified to pick up.  Currently controlled.  Patient denies personal or family history of medullary thyroid carcinoma (MTC) or in patients with Multiple Endocrine Neoplasia syndrome type 2 (MEN 2).  A1c 6.5% on 02/18/23.  Discussed constipation and how to manage.  Recommend Miralax plus senna if needed. Increase water.  Kieth Brightly, PharmD, BCACP Clinical Pharmacist, [facility]

## 2023-05-18 ENCOUNTER — Ambulatory Visit (INDEPENDENT_AMBULATORY_CARE_PROVIDER_SITE_OTHER): Payer: Medicare HMO | Admitting: *Deleted

## 2023-05-18 DIAGNOSIS — D518 Other vitamin B12 deficiency anemias: Secondary | ICD-10-CM

## 2023-05-18 DIAGNOSIS — E538 Deficiency of other specified B group vitamins: Secondary | ICD-10-CM

## 2023-05-18 NOTE — Progress Notes (Signed)
Patient is in office today for a nurse visit for B12 Injection. Patient Injection was given in the  Right deltoid. Patient tolerated injection well.

## 2023-05-20 ENCOUNTER — Encounter: Payer: Medicare HMO | Admitting: Adult Health

## 2023-05-21 ENCOUNTER — Ambulatory Visit (HOSPITAL_COMMUNITY): Payer: Medicare HMO

## 2023-05-21 ENCOUNTER — Ambulatory Visit: Payer: Medicare HMO | Admitting: Family Medicine

## 2023-05-21 ENCOUNTER — Encounter: Payer: Self-pay | Admitting: Family Medicine

## 2023-05-21 VITALS — BP 109/58 | HR 77 | Ht 70.0 in | Wt 211.0 lb

## 2023-05-21 DIAGNOSIS — Z7985 Long-term (current) use of injectable non-insulin antidiabetic drugs: Secondary | ICD-10-CM

## 2023-05-21 DIAGNOSIS — E1169 Type 2 diabetes mellitus with other specified complication: Secondary | ICD-10-CM | POA: Diagnosis not present

## 2023-05-21 DIAGNOSIS — J4489 Other specified chronic obstructive pulmonary disease: Secondary | ICD-10-CM | POA: Diagnosis not present

## 2023-05-21 DIAGNOSIS — E782 Mixed hyperlipidemia: Secondary | ICD-10-CM | POA: Diagnosis not present

## 2023-05-21 DIAGNOSIS — J449 Chronic obstructive pulmonary disease, unspecified: Secondary | ICD-10-CM | POA: Diagnosis not present

## 2023-05-21 DIAGNOSIS — N898 Other specified noninflammatory disorders of vagina: Secondary | ICD-10-CM

## 2023-05-21 LAB — BAYER DCA HB A1C WAIVED: HB A1C (BAYER DCA - WAIVED): 6.6 % — ABNORMAL HIGH (ref 4.8–5.6)

## 2023-05-21 MED ORDER — ALBUTEROL SULFATE HFA 108 (90 BASE) MCG/ACT IN AERS: 2.0000 | INHALATION_SPRAY | Freq: Four times a day (QID) | RESPIRATORY_TRACT | 1 refills | Status: DC | PRN

## 2023-05-21 MED ORDER — ALBUTEROL SULFATE (2.5 MG/3ML) 0.083% IN NEBU: 2.5000 mg | INHALATION_SOLUTION | Freq: Four times a day (QID) | RESPIRATORY_TRACT | 1 refills | Status: DC | PRN

## 2023-05-21 MED ORDER — SPIRIVA RESPIMAT 1.25 MCG/ACT IN AERS: 2.0000 | INHALATION_SPRAY | Freq: Every day | RESPIRATORY_TRACT | Status: DC

## 2023-05-21 MED ORDER — K-Y LUBRICATING EX GEL: 1.0000 g | Freq: Every day | CUTANEOUS | 5 refills | Status: AC

## 2023-05-21 NOTE — Addendum Note (Signed)
Addended by: Dorene Sorrow on: 05/21/2023 04:43 PM   Modules accepted: Orders

## 2023-05-21 NOTE — Progress Notes (Signed)
BP (!) 109/58   Pulse 77   Ht 5\' 10"  (1.778 m)   Wt 211 lb (95.7 kg)   SpO2 98%   BMI 30.28 kg/m    Subjective:   Patient ID: [name], female    DOB: 1955-10-05, 67 y.o.   MRN: [id number]  HPI: [name] is a 67 y.o. female presenting on 05/21/2023 for Medical Management of Chronic Issues, Hyperlipidemia, and Diabetes   HPI Type 2 diabetes mellitus Patient comes in today for recheck of his diabetes. Patient has been currently taking Jardiance and Ozempic and Zetia. Patient is not currently on an ACE inhibitor/ARB. Patient has not seen an ophthalmologist this year. Patient denies any new issues with their feet. The symptom started onset as an adult hyperlipidemia and COPD ARE RELATED TO DM   Hyperlipidemia Patient is coming in for recheck of his hyperlipidemia. The patient is currently taking Crestor. They deny any issues with myalgias or history of liver damage from it. They deny any focal numbness or weakness or chest pain.   COPD Patient is coming in for COPD recheck today.  She over the past few weeks has been having little bit more respiratory issues and congestion and drainage and she thinks it is related to allergies and she has been having your cough as well and wheezing along with that has been using her albuterol inhaler more frequently and it is helping.  Relevant past medical, surgical, family and social history reviewed and updated as indicated. Interim medical history since our last visit reviewed. Allergies and medications reviewed and updated.  Review of Systems  Constitutional:  Negative for chills and fever.  HENT:  Positive for congestion.   Eyes:  Negative for visual disturbance.  Respiratory:  Positive for cough, shortness of breath and wheezing. Negative for chest tightness.   Cardiovascular:  Negative for chest pain and leg swelling.  Genitourinary:  Negative for difficulty urinating and dysuria.  Musculoskeletal:  Negative for back pain and  gait problem.  Skin:  Negative for rash.  Neurological:  Negative for dizziness, light-headedness and headaches.  Psychiatric/Behavioral:  Negative for agitation and behavioral problems.   All other systems reviewed and are negative.   Per HPI unless specifically indicated above   Allergies as of 05/21/2023       Reactions   Fenofibrate Other (See Comments)   Flu-like    Penicillin G Other (See Comments)   Blisters in mouth   Pravastatin Other (See Comments)   "flu like symptoms"    Statins         Medication List        Accurate as of May 21, 2023 11:40 AM. If you have any questions, ask your nurse or doctor.          albuterol 108 (90 Base) MCG/ACT inhaler Commonly known as: Ventolin HFA Inhale 2 puffs into the lungs every 6 (six) hours as needed. For shortness of breath   albuterol (2.5 MG/3ML) 0.083% nebulizer solution Commonly known as: PROVENTIL Take 3 mLs (2.5 mg total) by nebulization every 6 (six) hours as needed for wheezing or shortness of breath.   citalopram 40 MG tablet Commonly known as: CELEXA TAKE ONE (1) TABLET BY MOUTH EVERY DAY   cycloSPORINE 0.05 % ophthalmic emulsion Commonly known as: RESTASIS Place 1 drop into both eyes 2 (two) times daily.   empagliflozin 25 MG Tabs tablet Commonly known as: Jardiance Take 1 tablet (25 mg total) by mouth daily before breakfast.  ezetimibe 10 MG tablet Commonly known as: ZETIA Take 1 tablet (10 mg total) by mouth daily.   gabapentin 600 MG tablet Commonly known as: NEURONTIN TAKE ONE (1) TABLET THREE (3) TIMES EACH DAY   K-Y Lubricating Gel Apply 1 g topically daily. Started by: Elige Radon Jasun Gasparini   pantoprazole 40 MG tablet Commonly known as: PROTONIX TAKE ONE (1) TABLET EACH DAY   rosuvastatin 5 MG tablet Commonly known as: Crestor Take 1 tablet (5 mg total) by mouth daily. Takes one every other day   Semaglutide (1 MG/DOSE) 4 MG/3ML Sopn Inject 1 mg as directed once a week.    Spiriva Respimat 1.25 MCG/ACT Aers Generic drug: Tiotropium Bromide Monohydrate Inhale 2 puffs into the lungs daily. Started by: Elige Radon Juliannah Ohmann   Symbicort 160-4.5 MCG/ACT inhaler Generic drug: budesonide-formoterol USE 2 PUFFS TWICE DAILY         Objective:   BP (!) 109/58   Pulse 77   Ht 5\' 10"  (1.778 m)   Wt 211 lb (95.7 kg)   SpO2 98%   BMI 30.28 kg/m   Wt Readings from Last 3 Encounters:  05/21/23 211 lb (95.7 kg)  02/18/23 210 lb (95.3 kg)  11/13/22 211 lb (95.7 kg)    Physical Exam Vitals and nursing note reviewed.  Constitutional:      General: She is not in acute distress.    Appearance: She is well-developed. She is not diaphoretic.  Eyes:     Conjunctiva/sclera: Conjunctivae normal.  Cardiovascular:     Rate and Rhythm: Normal rate and regular rhythm.     Heart sounds: Normal heart sounds. No murmur heard. Pulmonary:     Effort: Pulmonary effort is normal. No respiratory distress.     Breath sounds: Normal breath sounds. No wheezing, rhonchi or rales.  Chest:     Chest wall: No tenderness.  Musculoskeletal:        General: No swelling. Normal range of motion.  Skin:    General: Skin is warm and dry.     Findings: No rash.  Neurological:     Mental Status: She is alert and oriented to person, place, and time.     Coordination: Coordination normal.  Psychiatric:        Behavior: Behavior normal.       Assessment & Plan:   Problem List Items Addressed This Visit       Respiratory   COPD with chronic bronchitis (HCC)   Relevant Medications   Tiotropium Bromide Monohydrate (SPIRIVA RESPIMAT) 1.25 MCG/ACT AERS   albuterol (VENTOLIN HFA) 108 (90 Base) MCG/ACT inhaler   albuterol (PROVENTIL) (2.5 MG/3ML) 0.083% nebulizer solution     Endocrine   Type 2 diabetes mellitus with other specified complication (HCC) - Primary   Relevant Orders   Bayer DCA Hb A1c Waived     Other   Mixed hyperlipidemia   Other Visit Diagnoses      Vaginal dryness       Relevant Medications   Lubricants (K-Y LUBRICATING) GEL   Chronic obstructive pulmonary disease, unspecified COPD type (HCC)       Relevant Medications   Tiotropium Bromide Monohydrate (SPIRIVA RESPIMAT) 1.25 MCG/ACT AERS   albuterol (VENTOLIN HFA) 108 (90 Base) MCG/ACT inhaler   albuterol (PROVENTIL) (2.5 MG/3ML) 0.083% nebulizer solution     Recommended for her to add this Spiriva to her Symbicort and see if that does better for her.  Also gave refill on the albuterol and recommended for her to  get an antihistamine over-the-counter such as Claritin and start taking it daily and to call us back if she is not doing better. A1c looks good at 6.6.  No changes there.  Gave vaginal lubricant for her that she can use  Follow up plan: Return in about 3 months (around 08/21/2023), or if symptoms worsen or fail to improve, for dm and copd.  Counseling provided for all of the vaccine components Orders Placed This Encounter  Procedures   Bayer DCA Hb A1c Waived    Arville Care, MD [facility] 05/21/2023, 11:40 AM

## 2023-05-27 ENCOUNTER — Encounter: Payer: Self-pay | Admitting: Physician Assistant

## 2023-05-27 ENCOUNTER — Ambulatory Visit: Payer: Medicare HMO | Admitting: Physician Assistant

## 2023-05-27 DIAGNOSIS — F17210 Nicotine dependence, cigarettes, uncomplicated: Secondary | ICD-10-CM | POA: Diagnosis not present

## 2023-05-27 NOTE — Progress Notes (Signed)
Virtual Visit via Telephone Note  I connected with [name] on 05/27/23 at  1000 by telephone and verified that I am speaking with the correct person using two identifiers.  Location: Patient: home Provider: working virtually from home   I discussed the limitations, risks, security and privacy concerns of performing an evaluation and management service by telephone and the availability of in person appointments. I also discussed with the patient that there may be a patient responsible charge related to this service. The patient expressed understanding and agreed to proceed.     Shared Decision Making Visit Lung Cancer Screening Program [phone])   Eligibility: Age 67 Pack Years Smoking History Calculation 30 (# packs/per year x # years smoked) Recent History of coughing up blood  No Unexplained weight loss? No ( >Than 15 pounds within the last 6 months ) Prior History Lung / other cancer No (Diagnosis within the last 5 years already requiring surveillance chest CT Scans). Smoking Status Current Smoker  Visit Components: Discussion included one or more decision making aids. Yes Discussion included risk/benefits of screening. Yes Discussion included potential follow up diagnostic testing for abnormal scans. Yes Discussion included meaning and risk of over diagnosis. Yes Discussion included meaning and risk of False Positives. Yes Discussion included meaning of total radiation exposure. Yes  Counseling Included: Importance of adherence to annual lung cancer LDCT screening. Yes Impact of comorbidities on ability to participate in the program. Yes Ability and willingness to under diagnostic treatment: Yes  Smoking Cessation Counseling: Current Smokers:  Discussed importance of smoking cessation. Yes Information about tobacco cessation classes and interventions provided to patient. Yes Symptomatic Patient. No Diagnosis Code: Tobacco Use Z72.0 Asymptomatic Patient  Yes  Counseling (Intermediate counseling: > three minutes counseling) [id number] Information about tobacco cessation classes and interventions provided to patient. Yes Written Order for Lung Cancer Screening with LDCT placed in Epic. Yes (CT Chest Lung Cancer Screening Low Dose W/O CM) [id number] Z12.2-Screening of respiratory organs Z87.891-Personal history of nicotine dependence   I have spent 25 minutes of face to face/ virtual visit  time with the patient discussing the risks and benefits of lung cancer screening. We discussed the above noted topics. We paused at intervals to allow for questions to be asked and answered to ensure understanding.We discussed that the single most powerful action that anyone can take to decrease their risk of developing lung cancer is to quit smoking.  We discussed options for tools to aid in quitting smoking including nicotine replacement therapy, non-nicotine medications, support groups, Quit Smart classes, and behavior modification. We discussed that often times setting smaller, more achievable goals, such as eliminating 1 cigarette a day for a week and then 2 cigarettes a day for a week can be helpful in slowly decreasing the number of cigarettes smoked. I provided  them  with smoking cessation  information  with contact information for community resources, classes, free nicotine replacement therapy, and access to mobile apps, text messaging, and on-line smoking cessation help. I have also provided  them  the office contact information in the event they have any questions. We discussed the time and location of the scan, and that either Abigail Miyamoto RN, Karlton Lemon, RN  or I will call / send a letter with the results within 24-72 hours of receiving them. The patient verbalized understanding of all of  the above and had no further questions upon leaving the office. They have my contact information in the event they have any further questions.  I spent 3 minutes counseling  on smoking cessation and the health risks of continued tobacco abuse.  I explained to the patient that there has been a high incidence of coronary artery disease noted on these exams. I explained that this is a non-gated exam therefore degree or severity cannot be determined. This patient is on statin therapy. I have asked the patient to follow-up with their PCP regarding any incidental finding of coronary artery disease and management with diet or medication as their PCP  feels is clinically indicated. The patient verbalized understanding of the above and had no further questions upon completion of the visit.    Darcella Gasman Gwendolyne Welford, PA-C

## 2023-05-27 NOTE — Patient Instructions (Signed)

## 2023-06-01 ENCOUNTER — Ambulatory Visit (HOSPITAL_COMMUNITY)
Admission: RE | Admit: 2023-06-01 | Discharge: 2023-06-01 | Disposition: A | Discharge status: Home / Self Care | Payer: Medicare HMO | Source: Ambulatory Visit | Attending: Acute Care | Admitting: Acute Care

## 2023-06-01 DIAGNOSIS — Z122 Encounter for screening for malignant neoplasm of respiratory organs: Secondary | ICD-10-CM | POA: Insufficient documentation

## 2023-06-01 DIAGNOSIS — F17210 Nicotine dependence, cigarettes, uncomplicated: Secondary | ICD-10-CM | POA: Diagnosis not present

## 2023-06-01 DIAGNOSIS — Z87891 Personal history of nicotine dependence: Secondary | ICD-10-CM | POA: Insufficient documentation

## 2023-06-11 ENCOUNTER — Encounter: Payer: Self-pay | Admitting: Family Medicine

## 2023-06-19 ENCOUNTER — Ambulatory Visit (INDEPENDENT_AMBULATORY_CARE_PROVIDER_SITE_OTHER): Payer: Medicare HMO | Admitting: *Deleted

## 2023-06-19 ENCOUNTER — Telehealth: Payer: Self-pay | Admitting: Family Medicine

## 2023-06-19 DIAGNOSIS — E538 Deficiency of other specified B group vitamins: Secondary | ICD-10-CM

## 2023-06-19 DIAGNOSIS — D518 Other vitamin B12 deficiency anemias: Secondary | ICD-10-CM

## 2023-06-19 DIAGNOSIS — Z0279 Encounter for issue of other medical certificate: Secondary | ICD-10-CM

## 2023-06-19 DIAGNOSIS — Z23 Encounter for immunization: Secondary | ICD-10-CM

## 2023-06-19 NOTE — Telephone Encounter (Signed)
Elpidio Anis dropped off handicap forms to be completed and signed.  Form Fee Paid? (Y/N)       y     If NO, form is placed on front office manager desk to hold until payment received. If YES, then form will be placed in the RX/HH Nurse Coordinators box for completion.  Form will not be processed until payment is received

## 2023-06-19 NOTE — Progress Notes (Signed)
In today for B12 injection, tolerated well.

## 2023-06-26 ENCOUNTER — Other Ambulatory Visit: Payer: Self-pay

## 2023-06-26 DIAGNOSIS — Z122 Encounter for screening for malignant neoplasm of respiratory organs: Secondary | ICD-10-CM

## 2023-06-26 DIAGNOSIS — F17210 Nicotine dependence, cigarettes, uncomplicated: Secondary | ICD-10-CM

## 2023-06-26 DIAGNOSIS — Z87891 Personal history of nicotine dependence: Secondary | ICD-10-CM

## 2023-06-30 ENCOUNTER — Telehealth: Payer: Self-pay

## 2023-06-30 DIAGNOSIS — I2510 Atherosclerotic heart disease of native coronary artery without angina pectoris: Secondary | ICD-10-CM

## 2023-06-30 NOTE — Telephone Encounter (Signed)
-----   Message from Elige Radon [name] sent at 06/26/2023 11:12 AM EST ----- Patient's CT for lung cancer screening shows that she has COPD changes in her lungs but also shows that she has some plaques and thickening of the arteries surrounding the heart, these could be typical for age but it may not be a bad idea for her to go be evaluated by cardiologist.  If she would like this referral, we can place this, diagnosis coronary atherosclerosis, if she wants to wait to discuss at her next visit we can discuss then.  No signs of lung cancer ----- Message ----- From: Alphonsa Gin, RN Sent: 06/26/2023  11:04 AM EST To: Elige Radon [name], MD

## 2023-06-30 NOTE — Telephone Encounter (Signed)
Pt has been made aware of Dr. Darrol Poke recommendations. She is alright for cardio referral. Referral placed.

## 2023-07-07 ENCOUNTER — Other Ambulatory Visit: Payer: Self-pay | Admitting: Family Medicine

## 2023-07-07 DIAGNOSIS — J449 Chronic obstructive pulmonary disease, unspecified: Secondary | ICD-10-CM

## 2023-07-20 ENCOUNTER — Ambulatory Visit (INDEPENDENT_AMBULATORY_CARE_PROVIDER_SITE_OTHER): Payer: Medicare HMO

## 2023-07-20 DIAGNOSIS — D518 Other vitamin B12 deficiency anemias: Secondary | ICD-10-CM

## 2023-07-20 DIAGNOSIS — E538 Deficiency of other specified B group vitamins: Secondary | ICD-10-CM

## 2023-07-20 NOTE — Progress Notes (Signed)
Cyanocobalamin injection given to right deltoid.  Patient tolerated well. 

## 2023-07-31 ENCOUNTER — Ambulatory Visit: Payer: Medicare HMO

## 2023-07-31 VITALS — Ht 70.0 in | Wt 211.0 lb

## 2023-07-31 DIAGNOSIS — Z0000 Encounter for general adult medical examination without abnormal findings: Secondary | ICD-10-CM | POA: Diagnosis not present

## 2023-07-31 DIAGNOSIS — Z780 Asymptomatic menopausal state: Secondary | ICD-10-CM

## 2023-07-31 NOTE — Progress Notes (Signed)
Subjective:   Michelle MOLDENHAUER is a 67 y.o. female who presents for Medicare Annual (Subsequent) preventive examination.  Visit Complete: Virtual I connected with  Elpidio Anis on 07/31/23 by a audio enabled telemedicine application and verified that I am speaking with the correct person using two identifiers.  Patient Location: Home  Provider Location: Home Office  I discussed the limitations of evaluation and management by telemedicine. The patient expressed understanding and agreed to proceed.  Vital Signs: Because this visit was a virtual/telehealth visit, some criteria may be missing or patient reported. Any vitals not documented were not able to be obtained and vitals that have been documented are patient reported.  Cardiac Risk Factors include: advanced age (>81men, >69 women);diabetes mellitus;dyslipidemia;hypertension     Objective:    Today's Vitals   07/31/23 1636  Weight: 211 lb (95.7 kg)  Height: 5\' 10"  (1.778 m)   Body mass index is 30.28 kg/m.     07/31/2023    4:39 PM 05/14/2022    1:17 PM 05/05/2022    9:19 AM 07/22/2021    9:56 PM 04/06/2020    1:38 PM 04/06/2019    2:36 PM 03/29/2018    9:18 AM  Advanced Directives  Does Patient Have a Medical Advance Directive? Yes Yes Yes No Yes No;Yes Yes  Type of Estate agent of [place];Living will Healthcare Power of [place];Living will Healthcare Power of [place];Living will  Healthcare Power of [place];Living will;Out of facility DNR (pink MOST or yellow form) Living will;Healthcare Power of [employer] of [place];Living will  Does patient want to make changes to medical advance directive? No - Patient declined No - Patient declined   No - Patient declined No - Patient declined No - Patient declined  Copy of Healthcare Power of Attorney in Chart? Yes - validated most recent copy scanned in chart (See row information) No - copy requested   Yes - validated most recent copy  scanned in chart (See row information) No - copy requested No - copy requested  Would patient like information on creating a medical advance directive?      No - Patient declined     Current Medications (verified) Outpatient Encounter Medications as of 07/31/2023  Medication Sig   albuterol (PROVENTIL) (2.5 MG/3ML) 0.083% nebulizer solution Take 3 mLs (2.5 mg total) by nebulization every 6 (six) hours as needed for wheezing or shortness of breath.   albuterol (VENTOLIN HFA) 108 (90 Base) MCG/ACT inhaler Inhale 2 puffs into the lungs every 6 (six) hours as needed. For shortness of breath   budesonide-formoterol (SYMBICORT) 160-4.5 MCG/ACT inhaler USE 2 PUFFS TWICE DAILY   citalopram (CELEXA) 40 MG tablet TAKE ONE (1) TABLET BY MOUTH EVERY DAY   cycloSPORINE (RESTASIS) 0.05 % ophthalmic emulsion Place 1 drop into both eyes 2 (two) times daily.   empagliflozin (JARDIANCE) 25 MG TABS tablet Take 1 tablet (25 mg total) by mouth daily before breakfast.   ezetimibe (ZETIA) 10 MG tablet Take 1 tablet (10 mg total) by mouth daily.   gabapentin (NEURONTIN) 600 MG tablet TAKE ONE (1) TABLET THREE (3) TIMES EACH DAY   Lubricants (K-Y LUBRICATING) GEL Apply 1 g topically daily.   pantoprazole (PROTONIX) 40 MG tablet TAKE ONE (1) TABLET EACH DAY   rosuvastatin (CRESTOR) 5 MG tablet Take 1 tablet (5 mg total) by mouth daily. Takes one every other day   Semaglutide, 1 MG/DOSE, 4 MG/3ML SOPN Inject 1 mg as directed once a week.  Tiotropium Bromide Monohydrate (SPIRIVA RESPIMAT) 1.25 MCG/ACT AERS Inhale 2 puffs into the lungs daily.   Tiotropium Bromide Monohydrate (SPIRIVA RESPIMAT) 1.25 MCG/ACT AERS Inhale 2 puffs into the lungs daily.   Facility-Administered Encounter Medications as of 07/31/2023  Medication   cyanocobalamin ((VITAMIN B-12)) injection 1,000 mcg    Allergies (verified) Fenofibrate, Penicillin g, Pravastatin, and Statins   History: Past Medical History:  Diagnosis Date   Anxiety     Arthritis    Complication of anesthesia    COPD (chronic obstructive pulmonary disease) (HCC)    Diabetes mellitus without complication (HCC)    GERD (gastroesophageal reflux disease)    History of skin cancer    Migraine    Mixed hyperlipidemia    Multiple sclerosis (HCC)    Left-sided weakness   Neuropathy    PONV (postoperative nausea and vomiting)    Psoriasis    Seasonal allergies    Past Surgical History:  Procedure Laterality Date   ABDOMINAL HYSTERECTOMY  1984   CHOLECYSTECTOMY  1999   COLONOSCOPY N/A 08/02/2015   Procedure: COLONOSCOPY;  Surgeon: Malissa Hippo, MD;  [facility];  Service: Endoscopy;  Laterality: N/A;  12:00-moved to 1220 Ann to notify pt   KNEE ARTHROSCOPY  2003   RT   LAPAROSCOPIC APPENDECTOMY N/A 02/12/2017   Procedure: APPENDECTOMY LAPAROSCOPIC;  Surgeon: Franky Macho, MD;  Location: [facility];  Service: General;  Laterality: N/A;   Left breast lumpectomy  1985   Left carpal tunnel release Left 1990   PARTIAL KNEE ARTHROPLASTY Left 01/22/2015   Procedure: LEFT KNEE MEDIAL UNICOMPARTMENTAL KNEE;  Surgeon: Ollen Gross, MD;  Location: [facility];  Service: Orthopedics;  Laterality: Left;   Right total knee replacement  2010   SHOULDER SURGERY Left 2008   Sinus cyst removal     age 49   TOTAL HIP ARTHROPLASTY Right 05/14/2022   Procedure: TOTAL HIP ARTHROPLASTY ANTERIOR APPROACH;  Surgeon: Ollen Gross, MD;  Location: [facility];  Service: Orthopedics;  Laterality: Right;   TOTAL KNEE REVISION  05/05/2012   Procedure: TOTAL KNEE REVISION;  Surgeon: Loanne Drilling, MD;  Location: [facility];  Service: Orthopedics;  Laterality: Right;   WRIST SURGERY Right 2023   rt   Family History  Problem Relation Age of Onset   Dementia Mother    Thyroid disease Mother        goiter    Diabetes Mother    Atrial fibrillation Father        Died with pulmonary embolus   Emphysema Father    Heart disease Father    Other Father        blood clots    Celiac  disease Father    GI problems Father        dumping syndrome - malabsorbtion    Cancer - Colon Maternal Grandmother    Dementia Maternal Grandmother    Heart disease Maternal Grandfather    Stroke Maternal Grandfather    Other Sister        hereditary = blood clots    Parkinson's disease Brother    Alcohol abuse Brother    Arthritis Son    Heart attack Paternal Grandmother    Other Paternal Grandfather        appendix rupture   Hypertension Brother    Hypertension Son    Social History   Socioeconomic History   Marital status: Married    Spouse name: Clide Cliff    Number of children: 2  Years of education: Not on file   Highest education level: Some college, no degree  Occupational History   Occupation: diabled    Comment: teller - [employer]  Tobacco Use   Smoking status: Every Day    Current packs/day: 0.25    Average packs/day: 0.3 packs/day for 51.1 years (12.8 ttl pk-yrs)    Types: Cigarettes    Start date: 06/16/1972   Smokeless tobacco: Never   Tobacco comments:    3/4 pack a day sicne age 36  Vaping Use   Vaping status: Never Used  Substance and Sexual Activity   Alcohol use: No    Alcohol/week: 0.0 standard drinks of alcohol   Drug use: No   Sexual activity: Not on file  Other Topics Concern   Not on file  Social History Narrative   Disabled, married, lives with husband Parsons. 2 children  - 9 grandchildren, 2 dogs   Children live within 10 miles   Social Drivers of Health   Financial Resource Strain: Low Risk  (07/31/2023)   Overall Financial Resource Strain (CARDIA)    Difficulty of Paying Living Expenses: Not hard at all  Food Insecurity: No Food Insecurity (07/31/2023)   Hunger Vital Sign    Worried About Running Out of Food in the Last Year: Never true    Ran Out of Food in the Last Year: Never true  Transportation Needs: No Transportation Needs (07/31/2023)   PRAPARE - Administrator, Civil Service (Medical): No    Lack of  Transportation (Non-Medical): No  Physical Activity: Insufficiently Active (07/31/2023)   Exercise Vital Sign    Days of Exercise per Week: 3 days    Minutes of Exercise per Session: 20 min  Stress: No Stress Concern Present (07/31/2023)   [employer] of Occupational Health - Occupational Stress Questionnaire    Feeling of Stress : Not at all  Social Connections: Socially Integrated (07/31/2023)   Social Connection and Isolation Panel [NHANES]    Frequency of Communication with Friends and Family: More than three times a week    Frequency of Social Gatherings with Friends and Family: Three times a week    Attends Religious Services: More than 4 times per year    Active Member of Clubs or Organizations: Yes    Attends Banker Meetings: 1 to 4 times per year    Marital Status: Married    Tobacco Counseling Ready to quit: Not Answered Counseling given: Not Answered Tobacco comments: 3/4 pack a day sicne age 73   Clinical Intake:  Pre-visit preparation completed: Yes  Pain : No/denies pain     Diabetes: Yes CBG done?: No Did pt. bring in CBG monitor from home?: No  How often do you need to have someone help you when you read instructions, pamphlets, or other written materials from your doctor or pharmacy?: 1 - Never  Interpreter Needed?: No  Information entered by :: Kandis Fantasia LPN   Activities of Daily Living    07/31/2023    4:39 PM 08/01/2022   10:42 AM  In your present state of health, do you have any difficulty performing the following activities:  Hearing? 0 0  Vision? 0 0  Difficulty concentrating or making decisions? 0 0  Walking or climbing stairs? 0 0  Dressing or bathing? 0 0  Doing errands, shopping? 0 0  Preparing Food and eating ? N N  Using the Toilet? N N  In the past six months, have  you accidently leaked urine? N N  Do you have problems with loss of bowel control? N N  Managing your Medications? N N  Managing your  Finances? N N  Housekeeping or managing your Housekeeping? N N    Patient Care Team: [name], Elige Radon, MD as PCP - General (Family Medicine) Jonelle Sidle, MD as PCP - Cardiology (Cardiology) Malissa Hippo, MD (Inactive) as Consulting Physician (Gastroenterology) Erlene Senters, MD as Referring Physician (Orthopedic Surgery) Danella Maiers, [facility] as Pharmacist (Family Medicine) [facility]  Indicate any recent Medical Services you may have received from other than [facility] providers in the past year (date may be approximate).     Assessment:   This is a routine wellness examination for Brandey.  Hearing/Vision screen Hearing Screening - Comments:: Denies hearing difficulties   Vision Screening - Comments:: Wears rx glasses - up to date with routine eye exams with [facility] [place]     Goals Addressed   None   Depression Screen    07/31/2023    4:38 PM 05/21/2023   10:46 AM 02/18/2023    9:28 AM 11/13/2022   11:15 AM 08/01/2022   10:39 AM 05/07/2022   11:23 AM 02/26/2022    2:05 PM  PHQ 2/9 Scores  PHQ - 2 Score 0 0 1 2 0 2 2  PHQ- 9 Score  3 5 6  4 7     Fall Risk    07/31/2023    4:39 PM 05/21/2023   10:46 AM 02/18/2023    9:28 AM 11/13/2022   11:14 AM 08/13/2022    1:57 PM  Fall Risk   Falls in the past year? 0 0 1 1 1   Number falls in past yr: 0  0 0 0  Injury with Fall? 0  0 0 1  Risk for fall due to : No Fall Risks  Impaired balance/gait Impaired balance/gait Impaired balance/gait;Orthopedic patient  Follow up Falls prevention discussed;Education provided;Falls evaluation completed  Falls evaluation completed Falls evaluation completed Falls evaluation completed    MEDICARE RISK AT HOME: Medicare Risk at Home Any stairs in or around the home?: No If so, are there any without handrails?: No Home free of loose throw rugs in walkways, pet beds, electrical cords, etc?: Yes Adequate lighting in your home to reduce risk of falls?:  Yes Life alert?: No Use of a cane, walker or w/c?: No Grab bars in the bathroom?: Yes Shower chair or bench in shower?: No Elevated toilet seat or a handicapped toilet?: Yes  TIMED UP AND GO:  Was the test performed?  No    Cognitive Function:    03/29/2018    9:22 AM  MMSE - Mini Mental State Exam  Orientation to time 5  Orientation to Place 5  Registration 3  Attention/ Calculation 5  Recall 2  Language- name 2 objects 2  Language- repeat 1  Language- follow 3 step command 3  Language- read & follow direction 1  Write a sentence 1  Copy design 1  Total score 29        07/31/2023    4:39 PM 08/01/2022   10:43 AM 04/06/2020    1:42 PM 04/06/2019    2:44 PM  6CIT Screen  What Year? 0 points 0 points 0 points 0 points  What month? 0 points 0 points 0 points 0 points  What time? 0 points 0 points 0 points 0 points  Count back from 20 0  points 0 points 0 points 0 points  Months in reverse 0 points 0 points 0 points 0 points  Repeat phrase 0 points 0 points 0 points 0 points  Total Score 0 points 0 points 0 points 0 points    Immunizations Immunization History  Administered Date(s) Administered   Fluad Trivalent(High Dose 65+) 06/19/2023   Influenza,inj,Quad PF,6+ Mos 06/06/2013   Pneumococcal Conjugate-13 03/29/2018   Pneumococcal Polysaccharide-23 09/12/2013   Tdap 10/02/2014    TDAP status: Up to date  Flu Vaccine status: Declined, Education has been provided regarding the importance of this vaccine but patient still declined. Advised may receive this vaccine at local pharmacy or Health Dept. Aware to provide a copy of the vaccination record if obtained from local pharmacy or Health Dept. Verbalized acceptance and understanding.  Pneumococcal vaccine status: Declined,  Education has been provided regarding the importance of this vaccine but patient still declined. Advised may receive this vaccine at local pharmacy or Health Dept. Aware to provide a copy of the  vaccination record if obtained from local pharmacy or Health Dept. Verbalized acceptance and understanding.   Covid-19 vaccine status: Information provided on how to obtain vaccines.   Qualifies for Shingles Vaccine? Yes   Zostavax completed No   Shingrix Completed?: No.    Education has been provided regarding the importance of this vaccine. Patient has been advised to call insurance company to determine out of pocket expense if they have not yet received this vaccine. Advised may also receive vaccine at local pharmacy or Health Dept. Verbalized acceptance and understanding.  Screening Tests Health Maintenance  Topic Date Due   COVID-19 Vaccine (1) Never done   DEXA SCAN  03/30/2019   Pneumonia Vaccine 24+ Years old (3 of 3 - PPSV23 or PCV20) 06/17/2023   COLON CANCER SCREENING ANNUAL FOBT  08/21/2023 (Originally 03/27/2022)   MAMMOGRAM  11/13/2023 (Originally 04/18/2017)   HIV Screening  02/18/2024 (Originally 06/16/1971)   Zoster Vaccines- Shingrix (1 of 2) 05/23/2024 (Originally 06/16/1977)   OPHTHALMOLOGY EXAM  08/19/2023   FOOT EXAM  11/13/2023   HEMOGLOBIN A1C  11/19/2023   Diabetic kidney evaluation - eGFR measurement  02/18/2024   Diabetic kidney evaluation - Urine ACR  02/18/2024   Lung Cancer Screening  05/31/2024   Medicare Annual Wellness (AWV)  07/30/2024   DTaP/Tdap/Td (2 - Td or Tdap) 10/02/2024   Colonoscopy  08/01/2025   INFLUENZA VACCINE  Completed   Hepatitis C Screening  Completed   HPV VACCINES  Aged Out    Health Maintenance  Health Maintenance Due  Topic Date Due   COVID-19 Vaccine (1) Never done   DEXA SCAN  03/30/2019   Pneumonia Vaccine 79+ Years old (3 of 3 - PPSV23 or PCV20) 06/17/2023    Colorectal cancer screening: Type of screening: Colonoscopy. Completed 08/02/15. Repeat every 10 years  Mammogram status:  Patient declines   Bone Density status: Ordered today. Pt provided with contact info and advised to call to schedule appt.  Lung Cancer  Screening: (Low Dose CT Chest recommended if Age 30-80 years, 20 pack-year currently smoking OR have quit w/in 15years.) does qualify.   Lung Cancer Screening Referral: last 06/01/23  Additional Screening:  Hepatitis C Screening: does qualify; Completed 03/03/16  Vision Screening: Recommended annual ophthalmology exams for early detection of glaucoma and other disorders of the eye. Is the patient up to date with their annual eye exam?  Yes  Who is the provider or what is the name of the office  in which the patient attends annual eye exams? [facility] If pt is not established with a provider, would they like to be referred to a provider to establish care? No .   Dental Screening: Recommended annual dental exams for proper oral hygiene  Diabetic Foot Exam: Diabetic Foot Exam: Completed 11/13/22  Community Resource Referral / Chronic Care Management: CRR required this visit?  No   CCM required this visit?  No     Plan:     I have personally reviewed and noted the following in the patient's chart:   Medical and social history Use of alcohol, tobacco or illicit drugs  Current medications and supplements including opioid prescriptions. Patient is not currently taking opioid prescriptions. Functional ability and status Nutritional status Physical activity Advanced directives List of other physicians Hospitalizations, surgeries, and ER visits in previous 12 months Vitals Screenings to include cognitive, depression, and falls Referrals and appointments  In addition, I have reviewed and discussed with patient certain preventive protocols, quality metrics, and best practice recommendations. A written personalized care plan for preventive services as well as general preventive health recommendations were provided to patient.     Kandis Fantasia [place], [place]   [id number]   After Visit Summary: (MyChart) Due to this being a telephonic visit, the after visit summary with  patients personalized plan was offered to patient via MyChart   Nurse Notes: Patient is having problems in obtaining Jardiance through manufacturer program.

## 2023-07-31 NOTE — Patient Instructions (Signed)
[name] , Thank you for taking time to come for your Medicare Wellness Visit. I appreciate your ongoing commitment to your health goals. Please review the following plan we discussed and let me know if I can assist you in the future.   Referrals/Orders/Follow-Ups/Clinician Recommendations: Aim for 30 minutes of exercise or brisk walking, 6-8 glasses of water, and 5 servings of fruits and vegetables each day.  This is a list of the screening recommended for you and due dates:  Health Maintenance  Topic Date Due   COVID-19 Vaccine (1) Never done   DEXA scan (bone density measurement)  03/30/2019   Pneumonia Vaccine (3 of 3 - PPSV23 or PCV20) 06/17/2023   Stool Blood Test  08/21/2023*   Mammogram  11/13/2023*   HIV Screening  02/18/2024*   Zoster (Shingles) Vaccine (1 of 2) 05/23/2024*   Eye exam for diabetics  08/19/2023   Complete foot exam   11/13/2023   Hemoglobin A1C  11/19/2023   Yearly kidney function blood test for diabetes  02/18/2024   Yearly kidney health urinalysis for diabetes  02/18/2024   Screening for Lung Cancer  05/31/2024   Medicare Annual Wellness Visit  07/30/2024   DTaP/Tdap/Td vaccine (2 - Td or Tdap) 10/02/2024   Colon Cancer Screening  08/01/2025   Flu Shot  Completed   Hepatitis C Screening  Completed   HPV Vaccine  Aged Out  *Topic was postponed. The date shown is not the original due date.    Advanced directives: (ACP Link)Information on Advanced Care Planning can be found at [facility] [place] Advance Health Care Directives Advance Health Care Directives ([email or web address]   Next Medicare Annual Wellness Visit scheduled for next year: Yes

## 2023-08-14 ENCOUNTER — Encounter: Payer: Self-pay | Admitting: Family Medicine

## 2023-08-15 ENCOUNTER — Other Ambulatory Visit: Payer: Self-pay | Admitting: Family Medicine

## 2023-08-15 DIAGNOSIS — E1169 Type 2 diabetes mellitus with other specified complication: Secondary | ICD-10-CM

## 2023-08-20 ENCOUNTER — Ambulatory Visit (INDEPENDENT_AMBULATORY_CARE_PROVIDER_SITE_OTHER): Payer: Medicare HMO

## 2023-08-20 DIAGNOSIS — D518 Other vitamin B12 deficiency anemias: Secondary | ICD-10-CM

## 2023-08-20 DIAGNOSIS — E538 Deficiency of other specified B group vitamins: Secondary | ICD-10-CM

## 2023-08-20 NOTE — Progress Notes (Signed)
 Patient is in office today for a nurse visit for B12 Injection. Patient Injection was given in the  Left deltoid. Patient tolerated injection well.

## 2023-08-24 ENCOUNTER — Encounter: Payer: Self-pay | Admitting: Family Medicine

## 2023-08-24 ENCOUNTER — Ambulatory Visit (INDEPENDENT_AMBULATORY_CARE_PROVIDER_SITE_OTHER): Payer: Medicare HMO | Admitting: Family Medicine

## 2023-08-24 VITALS — BP 130/82 | HR 72 | Ht 70.0 in | Wt 211.0 lb

## 2023-08-24 DIAGNOSIS — J449 Chronic obstructive pulmonary disease, unspecified: Secondary | ICD-10-CM

## 2023-08-24 DIAGNOSIS — Z7985 Long-term (current) use of injectable non-insulin antidiabetic drugs: Secondary | ICD-10-CM | POA: Diagnosis not present

## 2023-08-24 DIAGNOSIS — J4489 Other specified chronic obstructive pulmonary disease: Secondary | ICD-10-CM

## 2023-08-24 DIAGNOSIS — E1169 Type 2 diabetes mellitus with other specified complication: Secondary | ICD-10-CM

## 2023-08-24 DIAGNOSIS — K219 Gastro-esophageal reflux disease without esophagitis: Secondary | ICD-10-CM

## 2023-08-24 DIAGNOSIS — B372 Candidiasis of skin and nail: Secondary | ICD-10-CM | POA: Diagnosis not present

## 2023-08-24 DIAGNOSIS — I2089 Other forms of angina pectoris: Secondary | ICD-10-CM | POA: Diagnosis not present

## 2023-08-24 DIAGNOSIS — E782 Mixed hyperlipidemia: Secondary | ICD-10-CM | POA: Diagnosis not present

## 2023-08-24 LAB — BAYER DCA HB A1C WAIVED: HB A1C (BAYER DCA - WAIVED): 6.3 % — ABNORMAL HIGH (ref 4.8–5.6)

## 2023-08-24 LAB — LIPID PANEL

## 2023-08-24 MED ORDER — KETOCONAZOLE 2 % EX CREA: TOPICAL_CREAM | Freq: Every day | CUTANEOUS | 1 refills | Status: DC | PRN

## 2023-08-24 NOTE — Progress Notes (Signed)
 BP 130/82   Pulse 72   Ht 5' 10 (1.778 m)   Wt 211 lb (95.7 kg)   SpO2 96%   BMI 30.28 kg/m    Subjective:   Patient ID: [name], female    DOB: September 08, 1955, 68 y.o.   MRN: [id number]  HPI: [name] is a 68 y.o. female presenting on 08/24/2023 for Medical Management of Chronic Issues and Diabetes   HPI Type 2 diabetes mellitus Patient comes in today for recheck of his diabetes. Patient has been currently taking Ozempic  and Jardiance . Patient is not currently on an ACE inhibitor/ARB. Patient has not seen an ophthalmologist this year. Patient denies any new issues with their feet. The symptom started onset as an adult hyperlipidemia ARE RELATED TO DM   Hyperlipidemia Patient is coming in for recheck of his hyperlipidemia. The patient is currently taking Zetia  and Crestor . They deny any issues with myalgias or history of liver damage from it. They deny any focal numbness or weakness or chest pain.   GERD Patient is currently on pantoprazole .  She denies any major symptoms or abdominal pain or belching or burping. She denies any blood in her stool or lightheadedness or dizziness.   COPD Patient is coming in for COPD recheck today.  He is currently on Symbicort , as she has not had the Spiriva  for some time due to cost.  Also has Ventolin ..  When she returned from [place]  back to the cold she did get a lot more wheezing and congestion and feeling more short of breath which she says it is improving and she still has a little bit of wheezing but is getting better.  She has been back for about 10 days.  Patient does complain of some left-sided chest pains that occur intermittently.  She says it has been random and does not necessarily have it when she is up and active or when she is sitting or when she is moving but sometimes she does have it when she is up and active.  She says sometimes the last 1 to 2 minutes and then last week she had 1 day that lasted most of the day where  she was having the chest pain.  She does have an appointment to go see a cardiologist in 2 days.  Patient does get the occasional yeast dermatitis sometimes in her groin and sometimes under her breasts and she uses ketoconazole  fluticasone  cream and it helps and would like a refill.  She got this originally from her dermatologist.  Relevant past medical, surgical, family and social history reviewed and updated as indicated. Interim medical history since our last visit reviewed. Allergies and medications reviewed and updated.  Review of Systems  Constitutional:  Negative for chills and fever.  HENT:  Positive for congestion. Negative for ear discharge, ear pain, postnasal drip, rhinorrhea, sinus pressure, sneezing and sore throat.   Eyes:  Negative for pain, redness and visual disturbance.  Respiratory:  Positive for cough and wheezing. Negative for chest tightness and shortness of breath.   Cardiovascular:  Negative for chest pain and leg swelling.  Genitourinary:  Negative for difficulty urinating and dysuria.  Musculoskeletal:  Negative for back pain and gait problem.  Skin:  Negative for rash.  Neurological:  Negative for light-headedness and headaches.  Psychiatric/Behavioral:  Negative for agitation and behavioral problems.   All other systems reviewed and are negative.   Per HPI unless specifically indicated above   Allergies as of 08/24/2023  Reactions   Fenofibrate Other (See Comments)   Flu-like    Penicillin G Other (See Comments)   Blisters in mouth   Pravastatin Other (See Comments)   flu like symptoms    Statins         Medication List        Accurate as of August 24, 2023 11:08 AM. If you have any questions, ask your nurse or doctor.          STOP taking these medications    Spiriva  Respimat 1.25 MCG/ACT Aers Generic drug: Tiotropium Bromide  Monohydrate Stopped by: Fonda LABOR Lisvet Rasheed       TAKE these medications    albuterol  108 (90  Base) MCG/ACT inhaler Commonly known as: Ventolin  HFA Inhale 2 puffs into the lungs every 6 (six) hours as needed. For shortness of breath   albuterol  (2.5 MG/3ML) 0.083% nebulizer solution Commonly known as: PROVENTIL  Take 3 mLs (2.5 mg total) by nebulization every 6 (six) hours as needed for wheezing or shortness of breath.   citalopram  40 MG tablet Commonly known as: CELEXA  TAKE ONE (1) TABLET BY MOUTH EVERY DAY   cycloSPORINE  0.05 % ophthalmic emulsion Commonly known as: RESTASIS  Place 1 drop into both eyes 2 (two) times daily.   ezetimibe  10 MG tablet Commonly known as: ZETIA  Take 1 tablet (10 mg total) by mouth daily.   fluticasone  0.05%-ketoconazole  2% 1:1 cream mixture Apply topically daily as needed. Equal parts ketoconazole  and fluticasone  Started by: Fonda LABOR Benigno Check   gabapentin  600 MG tablet Commonly known as: NEURONTIN  TAKE ONE (1) TABLET THREE (3) TIMES EACH DAY   Jardiance  25 MG Tabs tablet Generic drug: empagliflozin  TAKE 1 TABLET BY MOUTH EVERY MORNING BEFORE BREAKFAST   K-Y Lubricating Gel Apply 1 g topically daily.   pantoprazole  40 MG tablet Commonly known as: PROTONIX  TAKE ONE (1) TABLET EACH DAY   rosuvastatin  5 MG tablet Commonly known as: Crestor  Take 1 tablet (5 mg total) by mouth daily. Takes one every other day   Semaglutide  (1 MG/DOSE) 4 MG/3ML Sopn Inject 1 mg as directed once a week.   Symbicort  160-4.5 MCG/ACT inhaler Generic drug: budesonide -formoterol  USE 2 PUFFS TWICE DAILY         Objective:   BP 130/82   Pulse 72   Ht 5' 10 (1.778 m)   Wt 211 lb (95.7 kg)   SpO2 96%   BMI 30.28 kg/m   Wt Readings from Last 3 Encounters:  08/24/23 211 lb (95.7 kg)  07/31/23 211 lb (95.7 kg)  05/21/23 211 lb (95.7 kg)    Physical Exam Vitals and nursing note reviewed.  Constitutional:      General: She is not in acute distress.    Appearance: She is well-developed. She is not diaphoretic.  Eyes:     Conjunctiva/sclera:  Conjunctivae normal.  Cardiovascular:     Rate and Rhythm: Normal rate and regular rhythm.     Heart sounds: Normal heart sounds. No murmur heard. Pulmonary:     Effort: Pulmonary effort is normal. No respiratory distress.     Breath sounds: Normal breath sounds. No wheezing, rhonchi or rales.  Musculoskeletal:        General: No swelling or tenderness. Normal range of motion.  Skin:    General: Skin is warm and dry.     Findings: No rash.  Neurological:     Mental Status: She is alert and oriented to person, place, and time.     Coordination: Coordination normal.  Psychiatric:        Behavior: Behavior normal.       Assessment & Plan:   Problem List Items Addressed This Visit       Respiratory   COPD with chronic bronchitis (HCC)   COPD  GOLD II/ AB     Digestive   Gastroesophageal reflux disease without esophagitis     Endocrine   Type 2 diabetes mellitus with other specified complication (HCC) - Primary   Relevant Orders   CBC with Differential/Platelet   CMP14+EGFR   Lipid panel   Bayer DCA Hb A1c Waived     Other   Mixed hyperlipidemia   Relevant Orders   CBC with Differential/Platelet   CMP14+EGFR   Lipid panel   Bayer DCA Hb A1c Waived   Other Visit Diagnoses       Atypical angina (HCC)         Yeast dermatitis       Relevant Medications   fluticasone  0.05%-ketoconazole  2% 1:1 cream mixture       A1c is 6.3 and looks good, no changes Patient's blood pressure and breathing sounds okay today, recommended that she does have that cardiology appointment for the intermittent chest pain.  Her breathing seems to be improving per her and sounds good today.  Continue the Symbicort  and the Ventolin . Follow up plan: Return in about 3 months (around 11/22/2023), or if symptoms worsen or fail to improve, for dm and hld and copd.  Counseling provided for all of the vaccine components Orders Placed This Encounter  Procedures   CBC with Differential/Platelet    CMP14+EGFR   Lipid panel   Bayer DCA Hb A1c Waived    Fonda Levins, MD [facility] 08/24/2023, 11:08 AM

## 2023-08-25 LAB — CMP14+EGFR
ALT: 9 IU/L (ref 0–32)
AST: 10 IU/L (ref 0–40)
Albumin: 3.8 g/dL — ABNORMAL LOW (ref 3.9–4.9)
Alkaline Phosphatase: 84 IU/L (ref 44–121)
BUN/Creatinine Ratio: 6 — ABNORMAL LOW (ref 12–28)
BUN: 6 mg/dL — ABNORMAL LOW (ref 8–27)
Bilirubin Total: 0.5 mg/dL (ref 0.0–1.2)
CO2: 22 mmol/L (ref 20–29)
Calcium: 8.6 mg/dL — ABNORMAL LOW (ref 8.7–10.3)
Chloride: 106 mmol/L (ref 96–106)
Creatinine, Ser: 0.93 mg/dL (ref 0.57–1.00)
Globulin, Total: 2 g/dL (ref 1.5–4.5)
Glucose: 145 mg/dL — ABNORMAL HIGH (ref 70–99)
Potassium: 3.6 mmol/L (ref 3.5–5.2)
Sodium: 145 mmol/L — ABNORMAL HIGH (ref 134–144)
Total Protein: 5.8 g/dL — ABNORMAL LOW (ref 6.0–8.5)
eGFR: 68 mL/min/{1.73_m2} (ref >59)

## 2023-08-25 LAB — CBC WITH DIFFERENTIAL/PLATELET
Basophils Absolute: 0 10*3/uL (ref 0.0–0.2)
Basos: 1 %
EOS (ABSOLUTE): 0.2 10*3/uL (ref 0.0–0.4)
Eos: 2 %
Hematocrit: 43.4 % (ref 34.0–46.6)
Hemoglobin: 14.3 g/dL (ref 11.1–15.9)
Immature Grans (Abs): 0 10*3/uL (ref 0.0–0.1)
Immature Granulocytes: 0 %
Lymphocytes Absolute: 1.9 10*3/uL (ref 0.7–3.1)
Lymphs: 26 %
MCH: 29.2 pg (ref 26.6–33.0)
MCHC: 32.9 g/dL (ref 31.5–35.7)
MCV: 89 fL (ref 79–97)
Monocytes Absolute: 0.3 10*3/uL (ref 0.1–0.9)
Monocytes: 5 %
Neutrophils Absolute: 4.9 10*3/uL (ref 1.4–7.0)
Neutrophils: 66 %
Platelets: 233 10*3/uL (ref 150–450)
RBC: 4.89 x10E6/uL (ref 3.77–5.28)
RDW: 11.7 % (ref 11.7–15.4)
WBC: 7.4 10*3/uL (ref 3.4–10.8)

## 2023-08-25 LAB — LIPID PANEL
Cholesterol, Total: 141 mg/dL (ref 100–199)
HDL: 45 mg/dL (ref >39)
LDL CALC COMMENT:: 3.1 ratio (ref 0.0–4.4)
LDL Chol Calc (NIH): 77 mg/dL (ref 0–99)
Triglycerides: 102 mg/dL (ref 0–149)
VLDL Cholesterol Cal: 19 mg/dL (ref 5–40)

## 2023-08-26 ENCOUNTER — Encounter: Payer: Self-pay | Admitting: Internal Medicine

## 2023-08-26 ENCOUNTER — Ambulatory Visit: Payer: Medicare HMO | Attending: Internal Medicine | Admitting: Internal Medicine

## 2023-08-26 VITALS — BP 138/90 | HR 79 | Ht 70.0 in | Wt 212.8 lb

## 2023-08-26 DIAGNOSIS — I2510 Atherosclerotic heart disease of native coronary artery without angina pectoris: Secondary | ICD-10-CM | POA: Diagnosis not present

## 2023-08-26 DIAGNOSIS — R079 Chest pain, unspecified: Secondary | ICD-10-CM | POA: Diagnosis not present

## 2023-08-26 DIAGNOSIS — Z136 Encounter for screening for cardiovascular disorders: Secondary | ICD-10-CM

## 2023-08-26 DIAGNOSIS — I872 Venous insufficiency (chronic) (peripheral): Secondary | ICD-10-CM | POA: Insufficient documentation

## 2023-08-26 MED ORDER — NITROGLYCERIN 0.4 MG SL SUBL: 0.4000 mg | SUBLINGUAL_TABLET | SUBLINGUAL | 2 refills | Status: AC | PRN

## 2023-08-26 NOTE — Patient Instructions (Addendum)
 Medication Instructions:  Your physician has recommended you make the following change in your medication:  Start taking Nitroglycerin  0.4 mg for chest pain as needed. Dissolve one under tongue for chest pain every 5 minutes up to 3 doses. If no relief, proceed to ED or call 911. Continue taking all other medications as prescribed  Labwork: None  Testing/Procedures: Your physician has requested that you have a lexiscan  myoview . For further information please visit [email or web address] Please follow instruction sheet, as given.  Your physician has requested that you have an echocardiogram. Echocardiography is a painless test that uses sound waves to create images of your heart. It provides your doctor with information about the size and shape of your heart and how well your heart's chambers and valves are working. This procedure takes approximately one hour. There are no restrictions for this procedure. Please do NOT wear cologne, perfume, aftershave, or lotions (deodorant is allowed). Please arrive 15 minutes prior to your appointment time.  Please note: We ask at that you not bring children with you during ultrasound (echo/ vascular) testing. Due to room size and safety concerns, children are not allowed in the ultrasound rooms during exams. Our front office staff cannot provide observation of children in our lobby area while testing is being conducted. An adult accompanying a patient to their appointment will only be allowed in the ultrasound room at the discretion of the ultrasound technician under special circumstances. We apologize for any inconvenience.   Follow-Up: Your physician recommends that you schedule a follow-up appointment in: 6 months  Thank you for choosing [facility]!     Any Other Special Instructions Will Be Listed Below (If Applicable).  If you need a refill on your cardiac medications before your next appointment, please call your pharmacy.

## 2023-08-26 NOTE — Progress Notes (Signed)
 Cardiology Office Note  Date: 08/26/2023   ID: Taquasha, Torney January 09, 1956, MRN [id number]  PCP:  [name], Lucio Sabin, MD  Cardiologist:  Lasalle Pointer, MD Electrophysiologist:  None   History of Present Illness: [name] is a 68 y.o. female known to have multiple sclerosis, COPD, GERD, DM 2, nicotine abuse was referred to cardiology clinic for evaluation of imaging evidence of coronary artery calcifications.   Ongoing chest pains for the last couple of months, at rest and exertion, lasting between minutes and hours.  Associated with SOB.  No orthopnea, PND, leg swelling.  No dizziness with exertion, syncope.  She does endorse positional dizziness.  Currently smoking 10 cigarettes/day.  She has a great granddaughter at home that she takes care of.  Past Medical History:  Diagnosis Date   Anxiety    Arthritis    Complication of anesthesia    COPD (chronic obstructive pulmonary disease) (HCC)    Diabetes mellitus without complication (HCC)    GERD (gastroesophageal reflux disease)    History of skin cancer    Migraine    Mixed hyperlipidemia    Multiple sclerosis (HCC)    Left-sided weakness   Neuropathy    PONV (postoperative nausea and vomiting)    Psoriasis    Seasonal allergies     Past Surgical History:  Procedure Laterality Date   ABDOMINAL HYSTERECTOMY  1984   CHOLECYSTECTOMY  1999   COLONOSCOPY N/A 08/02/2015   Procedure: COLONOSCOPY;  Surgeon: Ruby Corporal, MD;  [facility];  Service: Endoscopy;  Laterality: N/A;  12:00-moved to 1220 Ann to notify pt   KNEE ARTHROSCOPY  2003   RT   LAPAROSCOPIC APPENDECTOMY N/A 02/12/2017   Procedure: APPENDECTOMY LAPAROSCOPIC;  Surgeon: Alanda Allegra, MD;  Location: [facility];  Service: General;  Laterality: N/A;   Left breast lumpectomy  1985   Left carpal tunnel release Left 1990   PARTIAL KNEE ARTHROPLASTY Left 01/22/2015   Procedure: LEFT KNEE MEDIAL UNICOMPARTMENTAL KNEE;  Surgeon: Liliane Rei, MD;  Location: [facility];  Service: Orthopedics;  Laterality: Left;   Right total knee replacement  2010   SHOULDER SURGERY Left 2008   Sinus cyst removal     age 49   TOTAL HIP ARTHROPLASTY Right 05/14/2022   Procedure: TOTAL HIP ARTHROPLASTY ANTERIOR APPROACH;  Surgeon: Liliane Rei, MD;  Location: [facility];  Service: Orthopedics;  Laterality: Right;   TOTAL KNEE REVISION  05/05/2012   Procedure: TOTAL KNEE REVISION;  Surgeon: Aurther Blue, MD;  Location: [facility];  Service: Orthopedics;  Laterality: Right;   WRIST SURGERY Right 2023   rt    Current Outpatient Medications  Medication Sig Dispense Refill   albuterol  (PROVENTIL ) (2.5 MG/3ML) 0.083% nebulizer solution Take 3 mLs (2.5 mg total) by nebulization every 6 (six) hours as needed for wheezing or shortness of breath. 150 mL 1   albuterol  (VENTOLIN  HFA) 108 (90 Base) MCG/ACT inhaler Inhale 2 puffs into the lungs every 6 (six) hours as needed. For shortness of breath 1 each 1   budesonide -formoterol  (SYMBICORT ) 160-4.5 MCG/ACT inhaler USE 2 PUFFS TWICE DAILY 10.2 g 1   citalopram  (CELEXA ) 40 MG tablet TAKE ONE (1) TABLET BY MOUTH EVERY DAY 90 tablet 3   cycloSPORINE  (RESTASIS ) 0.05 % ophthalmic emulsion Place 1 drop into both eyes 2 (two) times daily.     ezetimibe  (ZETIA ) 10 MG tablet Take 1 tablet (10 mg total) by mouth daily. 90 tablet 3  fluticasone  0.05%-ketoconazole  2% 1:1 cream mixture Apply topically daily as needed. Equal parts ketoconazole  and fluticasone  60 g 1   gabapentin  (NEURONTIN ) 600 MG tablet TAKE ONE (1) TABLET THREE (3) TIMES EACH DAY 270 tablet 3   JARDIANCE  25 MG TABS tablet TAKE 1 TABLET BY MOUTH EVERY MORNING BEFORE BREAKFAST 90 tablet 3   Lubricants (K-Y LUBRICATING) GEL Apply 1 g topically daily. 120 g 5   nitroGLYCERIN  (NITROSTAT ) 0.4 MG SL tablet Place 1 tablet (0.4 mg total) under the tongue every 5 (five) minutes x 3 doses as needed (if no relief after 3 dose proceed to ED or call 911). 25 tablet 2    pantoprazole  (PROTONIX ) 40 MG tablet TAKE ONE (1) TABLET EACH DAY 90 tablet 3   rosuvastatin  (CRESTOR ) 5 MG tablet Take 1 tablet (5 mg total) by mouth daily. Takes one every other day 45 tablet 3   Semaglutide , 1 MG/DOSE, 4 MG/3ML SOPN Inject 1 mg as directed once a week.     Current Facility-Administered Medications  Medication Dose Route Frequency Provider Last Rate Last Admin   cyanocobalamin  ((VITAMIN B-12)) injection 1,000 mcg  1,000 mcg Intramuscular Q30 days [name], Lucio Sabin, MD   1,000 mcg at 08/20/23 9147   Allergies:  Fenofibrate, Penicillin g, Pravastatin, and Statins   Social History: The patient  reports that she has been smoking cigarettes. She started smoking about 51 years ago. She has a 12.8 pack-year smoking history. She has never used smokeless tobacco. She reports that she does not drink alcohol  and does not use drugs.   Family History: The patient's family history includes Alcohol  abuse in her brother; Arthritis in her son; Atrial fibrillation in her father; Cancer - Colon in her maternal grandmother; Celiac disease in her father; Dementia in her maternal grandmother and mother; Diabetes in her mother; Emphysema in her father; GI problems in her father; Heart attack in her paternal grandmother; Heart disease in her father and maternal grandfather; Hypertension in her brother and son; Other in her father, paternal grandfather, and sister; Parkinson's disease in her brother; Stroke in her maternal grandfather; Thyroid  disease in her mother.   ROS:  Please see the history of present illness. Otherwise, complete review of systems is positive for none  All other systems are reviewed and negative.   Physical Exam: VS:  BP (!) 138/90   Pulse 79   Ht 5\' 10"  (1.778 m)   Wt 212 lb 12.8 oz (96.5 kg)   SpO2 95%   BMI 30.53 kg/m , BMI Body mass index is 30.53 kg/m.  Wt Readings from Last 3 Encounters:  08/26/23 212 lb 12.8 oz (96.5 kg)  08/24/23 211 lb (95.7 kg)  07/31/23  211 lb (95.7 kg)    General: Patient appears comfortable at rest. HEENT: Conjunctiva and lids normal, oropharynx clear with moist mucosa. Neck: Supple, no elevated JVP or carotid bruits, no thyromegaly. Lungs: Clear to auscultation, nonlabored breathing at rest. Cardiac: Regular rate and rhythm, no S3 or significant systolic murmur, no pericardial rub. Abdomen: Soft, nontender, no hepatomegaly, bowel sounds present, no guarding or rebound. Extremities: No pitting edema, distal pulses 2+. Skin: Warm and dry. Musculoskeletal: No kyphosis. Neuropsychiatric: Alert and oriented x3, affect grossly appropriate.  Recent Labwork: 08/24/2023: ALT 9; AST 10; BUN 6; Creatinine, Ser 0.93; Hemoglobin 14.3; Platelets 233; Potassium 3.6; Sodium 145     Component Value Date/Time   CHOL 141 08/24/2023 1003   CHOL 219 (H) 01/10/2013 0900   TRIG 102 08/24/2023 1003  TRIG 121 01/10/2015 0955   TRIG 134 01/10/2013 0900   HDL 45 08/24/2023 1003   HDL 43 01/10/2015 0955   HDL 37 (L) 01/10/2013 0900   CHOLHDL 3.1 08/24/2023 1003   CHOLHDL 6.4 12/10/2010 0608   VLDL 45 (H) 12/10/2010 0608   LDLCALC 77 08/24/2023 1003   LDLCALC 148 (H) 03/20/2014 0826   LDLCALC 155 (H) 01/10/2013 0900      Assessment and Plan:  Imaging evidence of coronary artery calcifications: Ongoing chest pains with rest and exertion for the last 2 months lasting between minutes and hours.  Associated with SOB with no orthopnea, PND or leg swelling.  Does not have SL NTG at home. Obtain echocardiogram and Lexiscan .  Prescribe SL NTG 0.4 mg as needed for chest pain.  HLD, unknown values: Continue rosuvastatin  5 mg p.o at bedtime.  Goal LDL less than 100.  Chronic venous insufficiency: No leg swelling, elevates legs symptoms.  Nicotine abuse: Smokes 10 cigarettes/day.  Smoking cessation counseling provided.       Medication Adjustments/Labs and Tests Ordered: Current medicines are reviewed at length with the patient today.   Concerns regarding medicines are outlined above.    Disposition:  Follow up  6 months  Signed Cardelia Sassano Priya Jhordan Mckibben, MD, 08/26/2023 11:25 AM    [facility] [address] [place], [place], [place] [address]

## 2023-08-28 ENCOUNTER — Encounter: Payer: Self-pay | Admitting: Family Medicine

## 2023-08-31 ENCOUNTER — Ambulatory Visit: Payer: Medicare HMO | Attending: Internal Medicine

## 2023-08-31 DIAGNOSIS — R079 Chest pain, unspecified: Secondary | ICD-10-CM

## 2023-08-31 LAB — ECHOCARDIOGRAM COMPLETE
AR max vel: 2.41 cm2
AV Area VTI: 2.91 cm2
AV Area mean vel: 2.74 cm2
AV Mean grad: 2 mm[Hg]
AV Peak grad: 4.8 mm[Hg]
Ao pk vel: 1.09 m/s
Area-P 1/2: 3.65 cm2
Calc EF: 64.4 %
MV VTI: 2.22 cm2
S' Lateral: 3.1 cm
Single Plane A2C EF: 67.8 %
Single Plane A4C EF: 63.2 %

## 2023-09-02 ENCOUNTER — Ambulatory Visit (HOSPITAL_COMMUNITY)
Admission: RE | Admit: 2023-09-02 | Discharge: 2023-09-02 | Disposition: A | Discharge status: Home / Self Care | Payer: Medicare HMO | Source: Ambulatory Visit | Attending: Internal Medicine | Admitting: Internal Medicine

## 2023-09-02 ENCOUNTER — Telehealth: Payer: Self-pay | Admitting: Family Medicine

## 2023-09-02 DIAGNOSIS — R079 Chest pain, unspecified: Secondary | ICD-10-CM | POA: Diagnosis not present

## 2023-09-02 LAB — NM MYOCAR MULTI W/SPECT W/WALL MOTION / EF
LV dias vol: 67 mL (ref 46–106)
LV sys vol: 21 mL
Nuc Stress EF: 69 %
Peak HR: 101 {beats}/min
Rest HR: 69 {beats}/min
Rest Nuclear Isotope Dose: 10 mCi
ST Depression (mm): 0 mm
Stress Nuclear Isotope Dose: 30 mCi
TID: 1.01

## 2023-09-02 MED ORDER — TECHNETIUM TC 99M TETROFOSMIN IV KIT
10.0000 | PACK | Freq: Once | INTRAVENOUS | Status: AC | PRN
  Administered 2023-09-02: 10.01 via INTRAVENOUS

## 2023-09-02 MED ORDER — REGADENOSON 0.4 MG/5ML IV SOLN
INTRAVENOUS | Status: AC
  Administered 2023-09-02: 0.4 mg via INTRAVENOUS
  Filled 2023-09-02: qty 5

## 2023-09-02 MED ORDER — TECHNETIUM TC 99M TETROFOSMIN IV KIT
30.0000 | PACK | Freq: Once | INTRAVENOUS | Status: AC | PRN
  Administered 2023-09-02: 31 via INTRAVENOUS

## 2023-09-02 MED ORDER — SODIUM CHLORIDE FLUSH 0.9 % IV SOLN
INTRAVENOUS | Status: AC
  Administered 2023-09-02: 10 mL via INTRAVENOUS
  Filled 2023-09-02: qty 10

## 2023-09-02 NOTE — Telephone Encounter (Signed)
Referring to message from [place] on 05/01/23, looks like a 4 mth supply was delivered to our office at that time. Does pt have more refills? Does she just need to call Novo Nordisk to check on status of shipment?  Copied from CRM [id number]. Topic: General - Other >> Sep 01, 2023  4:22 PM Antony Haste wrote: Reason for CRM: This patient is requesting to speak with Raynelle Fanning the Pharmacist for her Ozempic. She states it has not been refilled and Raynelle Fanning is the one who usually takes care of that for her. She states she is unsure if she will need to bring tax forms to the front desk for this medication. Callback [phone]

## 2023-09-03 ENCOUNTER — Other Ambulatory Visit (HOSPITAL_COMMUNITY): Payer: Self-pay

## 2023-09-04 ENCOUNTER — Telehealth: Payer: Self-pay

## 2023-09-04 NOTE — Progress Notes (Signed)
Pharmacy Medication Assistance Program Note    09/23/2023  Patient ID: [name], female   DOB: 10/15/1955, 68 y.o.   MRN: [id number]     09/04/2023  Outreach Medication One  Manufacturer Medication One [employer] Drugs Ozempic  Dose of Ozempic 1MG   Type of Radiographer, therapeutic Assistance  Date Application Submitted to Manufacturer 09/04/2023  Method Application Sent to Manufacturer Online  Patient Assistance Determination Approved  Approval Start Date 09/18/2023  Approval End Date 08/10/2024

## 2023-09-08 DIAGNOSIS — H5213 Myopia, bilateral: Secondary | ICD-10-CM | POA: Diagnosis not present

## 2023-09-09 NOTE — Progress Notes (Signed)
 Pharmacy Medication Assistance Program Note    11/26/2023  Patient ID: [name], female  DOB: 21-Oct-1955, 68 y.o.  MRN:  [id number]     09/04/2023  Outreach Medication Two  Manufacturer Medication Two [employer]  [employer] Drugs Jardiance  Dose of Jardiance 25MG   Type of Radiographer, therapeutic Assistance  Date Application Sent to Patient 09/09/2023  Application Items Requested Application  Date Application Sent to Prescriber 10/02/2023  Date Application Received From Patient 09/29/2023  Application Items Received From Patient Application  Date Application Received From Provider 10/06/2023  Method Application Sent to Manufacturer Fax  Date Application Submitted to Manufacturer 10/13/2023  Patient Assistance Determination Approved  Approval Start Date 10/19/2023    Renewal approved

## 2023-09-18 ENCOUNTER — Other Ambulatory Visit: Payer: Self-pay | Admitting: Family Medicine

## 2023-09-18 DIAGNOSIS — J449 Chronic obstructive pulmonary disease, unspecified: Secondary | ICD-10-CM

## 2023-09-21 ENCOUNTER — Ambulatory Visit (INDEPENDENT_AMBULATORY_CARE_PROVIDER_SITE_OTHER): Payer: Medicare HMO | Admitting: Family Medicine

## 2023-09-21 DIAGNOSIS — D518 Other vitamin B12 deficiency anemias: Secondary | ICD-10-CM

## 2023-09-21 DIAGNOSIS — E538 Deficiency of other specified B group vitamins: Secondary | ICD-10-CM

## 2023-09-21 NOTE — Progress Notes (Signed)
Vitamin b12 injection given and tolerated well.  

## 2023-10-19 ENCOUNTER — Ambulatory Visit: Payer: Medicare HMO

## 2023-10-19 ENCOUNTER — Ambulatory Visit (INDEPENDENT_AMBULATORY_CARE_PROVIDER_SITE_OTHER)

## 2023-10-19 DIAGNOSIS — E538 Deficiency of other specified B group vitamins: Secondary | ICD-10-CM

## 2023-10-19 DIAGNOSIS — D518 Other vitamin B12 deficiency anemias: Secondary | ICD-10-CM

## 2023-10-19 NOTE — Progress Notes (Signed)
 Patient is in office today for a nurse visit for B12 Injection. Patient Injection was given in the  Left deltoid. Patient tolerated injection well.

## 2023-11-10 DIAGNOSIS — S8992XA Unspecified injury of left lower leg, initial encounter: Secondary | ICD-10-CM | POA: Diagnosis not present

## 2023-11-10 DIAGNOSIS — Z23 Encounter for immunization: Secondary | ICD-10-CM | POA: Diagnosis not present

## 2023-11-10 DIAGNOSIS — W0110XA Fall on same level from slipping, tripping and stumbling with subsequent striking against unspecified object, initial encounter: Secondary | ICD-10-CM | POA: Diagnosis not present

## 2023-11-10 DIAGNOSIS — S81811A Laceration without foreign body, right lower leg, initial encounter: Secondary | ICD-10-CM | POA: Diagnosis not present

## 2023-11-10 DIAGNOSIS — M25562 Pain in left knee: Secondary | ICD-10-CM | POA: Diagnosis not present

## 2023-11-16 ENCOUNTER — Ambulatory Visit: Admitting: Family

## 2023-11-16 ENCOUNTER — Encounter: Payer: Self-pay | Admitting: Family

## 2023-11-16 ENCOUNTER — Inpatient Hospital Stay: Admitting: Nurse Practitioner

## 2023-11-16 VITALS — BP 125/58 | HR 78 | Temp 97.6°F | Ht 70.0 in | Wt 214.0 lb

## 2023-11-16 DIAGNOSIS — Z09 Encounter for follow-up examination after completed treatment for conditions other than malignant neoplasm: Secondary | ICD-10-CM

## 2023-11-16 DIAGNOSIS — Z96659 Presence of unspecified artificial knee joint: Secondary | ICD-10-CM | POA: Diagnosis not present

## 2023-11-16 DIAGNOSIS — W108XXD Fall (on) (from) other stairs and steps, subsequent encounter: Secondary | ICD-10-CM | POA: Diagnosis not present

## 2023-11-16 DIAGNOSIS — S81811D Laceration without foreign body, right lower leg, subsequent encounter: Secondary | ICD-10-CM | POA: Diagnosis not present

## 2023-11-16 DIAGNOSIS — M25561 Pain in right knee: Secondary | ICD-10-CM

## 2023-11-16 DIAGNOSIS — M25562 Pain in left knee: Secondary | ICD-10-CM | POA: Diagnosis not present

## 2023-11-16 MED ORDER — MUPIROCIN 2 % EX OINT
1.0000 | TOPICAL_OINTMENT | Freq: Two times a day (BID) | CUTANEOUS | 0 refills | Status: DC
Start: 2023-11-16 — End: 2024-03-11

## 2023-11-16 NOTE — Progress Notes (Signed)
 Subjective:    Patient ID: [name], female    DOB: 12-21-1955, 68 y.o.   MRN: [id number]  Chief Complaint  Patient presents with   Hospitalization Follow-up   PT presents to the office today for hospital follow up. [place] the beach and was walking inside her camper and fell on 11/10/23. She has a skin tear of right lower leg and bruising of left knee. She received the sutures on the bottom of her her skin tear. Had negative x-ray of bilateral knee and right tibia.  Knee Pain  The incident occurred more than 1 week ago. The injury mechanism was a fall. The pain is present in the left knee. The pain is at a severity of 4/10. The pain is moderate. The pain has been Intermittent since onset. She reports no foreign bodies present. The symptoms are aggravated by weight bearing and movement. She has tried rest for the symptoms. The treatment provided mild relief.  Wound Check She was originally treated 5 to 10 days ago. Her temperature was unmeasured prior to arrival. The redness has improved. The swelling has not changed. The pain has improved.      Review of Systems  All other systems reviewed and are negative.   Social History   Socioeconomic History   Marital status: Married    Spouse name: [name]    Number of children: 2   Years of education: Not on file   Highest education level: Some college, no degree  Occupational History   Occupation: diabled    Comment: Materials engineer  Tobacco Use   Smoking status: Every Day    Current packs/day: 0.25    Average packs/day: 0.3 packs/day for 51.4 years (12.9 ttl pk-yrs)    Types: Cigarettes    Start date: 06/16/1972   Smokeless tobacco: Never   Tobacco comments:    3/4 pack a day sicne age 32  Vaping Use   Vaping status: Never Used  Substance and Sexual Activity   Alcohol use: No    Alcohol/week: 0.0 standard drinks of alcohol   Drug use: No   Sexual activity: Not on file  Other Topics Concern   Not on file   Social History Narrative   Disabled, married, lives with husband [name]. 2 children  - 9 grandchildren, 2 dogs   Children live within 10 miles   Social Drivers of Health   Financial Resource Strain: Low Risk  (08/23/2023)   Overall Financial Resource Strain (CARDIA)    Difficulty of Paying Living Expenses: Not very hard  Food Insecurity: No Food Insecurity (08/23/2023)   Hunger Vital Sign    Worried About Running Out of Food in the Last Year: Never true    Ran Out of Food in the Last Year: Never true  Transportation Needs: No Transportation Needs (08/23/2023)   PRAPARE - Administrator, Civil Service (Medical): No    Lack of Transportation (Non-Medical): No  Physical Activity: Insufficiently Active (08/23/2023)   Exercise Vital Sign    Days of Exercise per Week: 2 days    Minutes of Exercise per Session: 20 min  Stress: No Stress Concern Present (08/23/2023)   [employer] of Occupational Health - Occupational Stress Questionnaire    Feeling of Stress : Only a little  Social Connections: Socially Integrated (08/23/2023)   Social Connection and Isolation Panel [NHANES]    Frequency of Communication with Friends and Family: More than three times a week  Frequency of Social Gatherings with Friends and Family: Once a week    Attends Religious Services: More than 4 times per year    Active Member of [employer] or Organizations: Yes    Attends Engineer, structural: More than 4 times per year    Marital Status: Married   Family History  Problem Relation Age of Onset   Dementia Mother    Thyroid disease Mother        goiter    Diabetes Mother    Atrial fibrillation Father        Died with pulmonary embolus   Emphysema Father    Heart disease Father    Other Father        blood clots    Celiac disease Father    GI problems Father        dumping syndrome - malabsorbtion    Cancer - Colon Maternal Grandmother    Dementia Maternal Grandmother    Heart disease  Maternal Grandfather    Stroke Maternal Grandfather    Other Sister        hereditary = blood clots    Parkinson's disease Brother    Alcohol abuse Brother    Arthritis Son    Heart attack Paternal Grandmother    Other Paternal Grandfather        appendix rupture   Hypertension Brother    Hypertension Son         Objective:   Physical Exam Vitals reviewed.  Constitutional:      General: She is not in acute distress.    Appearance: She is well-developed.  HENT:     Head: Normocephalic and atraumatic.  Eyes:     Pupils: Pupils are equal, round, and reactive to light.  Neck:     Thyroid: No thyromegaly.  Cardiovascular:     Rate and Rhythm: Normal rate and regular rhythm.     Heart sounds: Normal heart sounds. No murmur heard. Pulmonary:     Effort: Pulmonary effort is normal. No respiratory distress.     Breath sounds: Normal breath sounds. No wheezing.  Abdominal:     General: Bowel sounds are normal. There is no distension.     Palpations: Abdomen is soft.     Tenderness: There is no abdominal tenderness.  Musculoskeletal:        General: No tenderness. Normal range of motion.     Cervical back: Normal range of motion and neck supple.  Skin:    General: Skin is warm and dry.     Findings: Bruising, erythema and wound present.     Comments: Right lower leg skin tear, shape of "L" 12X7 cm,  medial wound 1.5cm. Erythemas and mild swelling noted   Neurological:     Mental Status: She is alert and oriented to person, place, and time.     Cranial Nerves: No cranial nerve deficit.     Deep Tendon Reflexes: Reflexes are normal and symmetric.  Psychiatric:        Behavior: Behavior normal.        Thought Content: Thought content normal.        Judgment: Judgment normal.          Ht 5\' 10"  (1.778 m)   Wt 214 lb (97.1 kg)   BMI 30.71 kg/m      Assessment & Plan:  [name] comes in today with chief complaint of Hospitalization Follow-up   Diagnosis  and orders addressed:  1.  Noninfected skin tear of right lower extremity, subsequent encounter (Primary) - mupirocin ointment (BACTROBAN) 2 %; Apply 1 Application topically 2 (two) times daily.  Dispense: 22 g; Refill: 0  2. Hospital discharge follow-up  3. Fall on steps, subsequent encounter  4. Acute pain of both knees   5. History of total knee replacement, unspecified laterality   Keep follow up with PCP to have sutures removed  Report any s/s of infection  Continue daily dressing changes  Fall precautions discussed  Continue to rest and elevate Follow up if symptoms worsen or do not improve     Jannifer Rodney, FNP

## 2023-11-16 NOTE — Patient Instructions (Signed)
 Skin Tear A skin tear is a wound in which the top layers of skin have peeled off from the deeper skin or tissues under the skin. This is a common problem as people get older because the skin becomes thinner and easier to break. Also, some medicines, such as oral corticosteroids, can lead to thinning skin if they are taken for long. Skin tears are often caused by: Falls. Removing tape strips from a wound. Skin tears may be partial or full. The skin may be slit open (with the skin separated from the wound but still attached) or the skin may be completely torn away. Putting the torn skin back to cover the wound helps the skin tear to heal. A skin tear is often repaired with skin tape or tape strips. Depending on the location of the wound and the amount of drainage, a bandage may be put over a skin tape or tape strip. Follow these instructions at home: Wound care  Clean the wound as told by your health care provider. You may be told to keep the wound dry for the first few days. If you are told to clean the wound: Wash the wound as told. This may include using mild soap and water, a wound cleanser, or a saltwater or saline solution. Do not use hydrogen peroxide or alcohol. These can slow healing. If using soap, rinse the wound with water to remove all soap. Do not rub the wound dry. Pat it gently with a clean towel or let it air-dry. Change any bandages. This includes changing the bandage if it gets wet, gets dirty, or starts to smell bad. Use your antibiotic ointment if you are told. To change your bandage: Wash your hands with soap and water for at least 20 seconds before and after you change your bandage. If soap and water are not available, use hand sanitizer. Leave tape strips or bandages in place. These may need to stay in place for days or even weeks. If tape strip edges start to loosen and curl up, you may trim the loose edges. Do not remove tape strips or bandages unless your provider tells you  to do that. Check your wound every day for signs of infection. Check for: Redness, swelling, or pain. More fluid or blood. Warmth. Pus or a bad smell. Do not scratch or pick at the wound. Protect the injured area until it's healed. Medicines Take over-the-counter and prescription medicines only as told by your provider. If you were prescribed antibiotics, take or apply them as told by your provider. Do not stop using the antibiotic even if you start to feel better. General instructions Keep the dressing dry as told by your provider. Do not take baths, swim, use a hot tub, or do anything that puts your wound underwater until your provider approves. Ask your provider if you may take showers. You may only be allowed to take sponge baths. Contact a health care provider if: You have redness, swelling, or pain around your wound. You have more fluid or blood coming from your wound. Your wound, or the area around your wound, feels warm to the touch. You have pus or a bad smell coming from your wound. Get help right away if: You have a red streak that goes away from your wound. You have a fever and chills, and your symptoms suddenly get worse. This information is not intended to replace advice given to you by your health care provider. Make sure you discuss any questions you have  with your health care provider. Document Revised: 10/08/2022 Document Reviewed: 10/08/2022 Elsevier Patient Education  2024 [employer].

## 2023-11-19 ENCOUNTER — Ambulatory Visit (INDEPENDENT_AMBULATORY_CARE_PROVIDER_SITE_OTHER)

## 2023-11-19 DIAGNOSIS — D518 Other vitamin B12 deficiency anemias: Secondary | ICD-10-CM

## 2023-11-19 DIAGNOSIS — E538 Deficiency of other specified B group vitamins: Secondary | ICD-10-CM

## 2023-11-19 NOTE — Progress Notes (Signed)
 Patient is in office today for a nurse visit for B12 Injection. Patient Injection was given in the  Right deltoid. Patient tolerated injection well.

## 2023-11-24 ENCOUNTER — Other Ambulatory Visit: Payer: Self-pay | Admitting: Family Medicine

## 2023-11-24 DIAGNOSIS — J449 Chronic obstructive pulmonary disease, unspecified: Secondary | ICD-10-CM

## 2023-11-24 NOTE — Telephone Encounter (Signed)
 Per automated system, application still not processed. Will call to f/u.

## 2023-11-30 ENCOUNTER — Ambulatory Visit: Payer: Medicare HMO | Admitting: Family Medicine

## 2023-11-30 ENCOUNTER — Other Ambulatory Visit: Payer: Medicare HMO

## 2023-12-10 ENCOUNTER — Encounter: Payer: Self-pay | Admitting: Family Medicine

## 2023-12-10 ENCOUNTER — Ambulatory Visit (INDEPENDENT_AMBULATORY_CARE_PROVIDER_SITE_OTHER): Payer: Medicare HMO

## 2023-12-10 ENCOUNTER — Ambulatory Visit: Payer: Medicare HMO | Admitting: Family Medicine

## 2023-12-10 VITALS — BP 96/64 | HR 75 | Ht 70.0 in | Wt 214.0 lb

## 2023-12-10 DIAGNOSIS — K219 Gastro-esophageal reflux disease without esophagitis: Secondary | ICD-10-CM | POA: Diagnosis not present

## 2023-12-10 DIAGNOSIS — Z7985 Long-term (current) use of injectable non-insulin antidiabetic drugs: Secondary | ICD-10-CM | POA: Diagnosis not present

## 2023-12-10 DIAGNOSIS — Z780 Asymptomatic menopausal state: Secondary | ICD-10-CM

## 2023-12-10 DIAGNOSIS — E1169 Type 2 diabetes mellitus with other specified complication: Secondary | ICD-10-CM | POA: Diagnosis not present

## 2023-12-10 DIAGNOSIS — E782 Mixed hyperlipidemia: Secondary | ICD-10-CM

## 2023-12-10 DIAGNOSIS — J449 Chronic obstructive pulmonary disease, unspecified: Secondary | ICD-10-CM | POA: Diagnosis not present

## 2023-12-10 DIAGNOSIS — M8588 Other specified disorders of bone density and structure, other site: Secondary | ICD-10-CM | POA: Diagnosis not present

## 2023-12-10 DIAGNOSIS — F411 Generalized anxiety disorder: Secondary | ICD-10-CM

## 2023-12-10 LAB — BAYER DCA HB A1C WAIVED: HB A1C (BAYER DCA - WAIVED): 6.6 % — ABNORMAL HIGH (ref 4.8–5.6)

## 2023-12-10 MED ORDER — GABAPENTIN 600 MG PO TABS: ORAL_TABLET | ORAL | 3 refills | Status: AC

## 2023-12-10 MED ORDER — ALBUTEROL SULFATE (2.5 MG/3ML) 0.083% IN NEBU: 2.5000 mg | INHALATION_SOLUTION | Freq: Four times a day (QID) | RESPIRATORY_TRACT | 1 refills | Status: AC | PRN

## 2023-12-10 MED ORDER — ROSUVASTATIN CALCIUM 5 MG PO TABS: 5.0000 mg | ORAL_TABLET | Freq: Every day | ORAL | 3 refills | Status: AC

## 2023-12-10 MED ORDER — PANTOPRAZOLE SODIUM 40 MG PO TBEC: DELAYED_RELEASE_TABLET | ORAL | 3 refills | Status: AC

## 2023-12-10 MED ORDER — BUPROPION HCL ER (XL) 150 MG PO TB24: 150.0000 mg | ORAL_TABLET | Freq: Every day | ORAL | 1 refills | Status: DC

## 2023-12-10 MED ORDER — ALBUTEROL SULFATE HFA 108 (90 BASE) MCG/ACT IN AERS: 2.0000 | INHALATION_SPRAY | Freq: Four times a day (QID) | RESPIRATORY_TRACT | 1 refills | Status: AC | PRN

## 2023-12-10 MED ORDER — EZETIMIBE 10 MG PO TABS: 10.0000 mg | ORAL_TABLET | Freq: Every day | ORAL | 3 refills | Status: AC

## 2023-12-10 NOTE — Progress Notes (Signed)
 BP 96/64   Pulse 75   Ht 5\' 10"  (1.778 m)   Wt 214 lb (97.1 kg)   SpO2 96%   BMI 30.71 kg/m    Subjective:   Patient ID: [name], female    DOB: 1956-06-30, 68 y.o.   MRN: [id number]  HPI: [name] is a 68 y.o. female presenting on 12/10/2023 for Medical Management of Chronic Issues, Hyperlipidemia, Diabetes, skin tear (RLE- present for 49m. Improving.), and Anxiety (Doing well with Celexa  but believes medication is causing skin thinning and bruising.)   HPI Type 2 diabetes mellitus Patient comes in today for recheck of his diabetes. Patient has been currently taking Ozempic . Patient is not currently on an ACE inhibitor/ARB. Patient has not seen an ophthalmologist this year. Patient denies any new issues with their feet. The symptom started onset as an adult hyperlipidemia ARE RELATED TO DM   Hyperlipidemia Patient is coming in for recheck of his hyperlipidemia. The patient is currently taking Crestor  and Zetia . They deny any issues with myalgias or history of liver damage from it. They deny any focal numbness or weakness or chest pain.   Skin tear right leg Right anterior leg shin skin tear, appears to be healing, good tissue base, continue to wrap and change every day.  Anxiety Patient is taking Celexa  and she feels like medicine is helping.  Patient feels like she has had a little more anxiety recently and then she is worried because she feels like her blood is thinner and she is having more bruising would like to come off the citalopram .  She wants to try something else to help with her anxiety.  She feels like she has a lot more anxiety right now also because of her recent fall and skin tear on her right leg.    12/10/2023    1:07 PM 08/24/2023    9:54 AM 07/31/2023    4:38 PM 05/21/2023   10:46 AM 02/18/2023    9:28 AM  Depression screen PHQ 2/9  Decreased Interest 1 2 0 0 1  Down, Depressed, Hopeless 1 2 0 0 0  PHQ - 2 Score 2 4 0 0 1  Altered sleeping 1 1  0 1   Tired, decreased energy 2 2  3 2   Change in appetite 2 1  0 1  Feeling bad or failure about yourself  1 1  0 0  Trouble concentrating 0 1  0 0  Moving slowly or fidgety/restless 0 1  0 0  Suicidal thoughts 0 0  0 0  PHQ-9 Score 8 11  3 5   Difficult doing work/chores Somewhat difficult Somewhat difficult  Not difficult at all Not difficult at all     Relevant past medical, surgical, family and social history reviewed and updated as indicated. Interim medical history since our last visit reviewed. Allergies and medications reviewed and updated.  Review of Systems  Constitutional:  Negative for chills and fever.  Eyes:  Negative for visual disturbance.  Respiratory:  Negative for chest tightness and shortness of breath.   Cardiovascular:  Negative for chest pain and leg swelling.  Genitourinary:  Negative for difficulty urinating and dysuria.  Musculoskeletal:  Negative for back pain and gait problem.  Skin:  Positive for wound. Negative for rash.  Neurological:  Negative for dizziness, light-headedness and headaches.  Psychiatric/Behavioral:  Negative for agitation and behavioral problems.   All other systems reviewed and are negative.   Per HPI unless specifically indicated  above   Allergies as of 12/10/2023       Reactions   Fenofibrate Other (See Comments)   Flu-like    Penicillin G Other (See Comments)   Blisters in mouth   Pravastatin Other (See Comments)   "flu like symptoms"    Statins         Medication List        Accurate as of Dec 10, 2023  2:02 PM. If you have any questions, ask your nurse or doctor.          STOP taking these medications    citalopram  40 MG tablet Commonly known as: CELEXA  Stopped by: Lucio Sabin Georg Ang       TAKE these medications    albuterol  (2.5 MG/3ML) 0.083% nebulizer solution Commonly known as: PROVENTIL  Take 3 mLs (2.5 mg total) by nebulization every 6 (six) hours as needed for wheezing or shortness of breath.    albuterol  108 (90 Base) MCG/ACT inhaler Commonly known as: Ventolin  HFA Inhale 2 puffs into the lungs every 6 (six) hours as needed. For shortness of breath   buPROPion  150 MG 24 hr tablet Commonly known as: Wellbutrin  XL Take 1 tablet (150 mg total) by mouth daily. Started by: Lucio Sabin Leyla Soliz   cycloSPORINE  0.05 % ophthalmic emulsion Commonly known as: RESTASIS  Place 1 drop into both eyes 2 (two) times daily.   ezetimibe  10 MG tablet Commonly known as: ZETIA  Take 1 tablet (10 mg total) by mouth daily.   gabapentin  600 MG tablet Commonly known as: NEURONTIN  TAKE ONE (1) TABLET THREE (3) TIMES EACH DAY   Jardiance  25 MG Tabs tablet Generic drug: empagliflozin  TAKE 1 TABLET BY MOUTH EVERY MORNING BEFORE BREAKFAST   K-Y Lubricating Gel Apply 1 g topically daily.   mupirocin  ointment 2 % Commonly known as: BACTROBAN  Apply 1 Application topically 2 (two) times daily.   nitroGLYCERIN  0.4 MG SL tablet Commonly known as: Nitrostat  Place 1 tablet (0.4 mg total) under the tongue every 5 (five) minutes x 3 doses as needed (if no relief after 3 dose proceed to ED or call 911).   pantoprazole  40 MG tablet Commonly known as: PROTONIX  TAKE ONE (1) TABLET EACH DAY   rosuvastatin  5 MG tablet Commonly known as: Crestor  Take 1 tablet (5 mg total) by mouth daily. Takes one every other day   Semaglutide  (1 MG/DOSE) 4 MG/3ML Sopn Inject 1 mg as directed once a week.   Symbicort  160-4.5 MCG/ACT inhaler Generic drug: budesonide -formoterol  USE 2 PUFFS TWICE DAILY         Objective:   BP 96/64   Pulse 75   Ht 5\' 10"  (1.778 m)   Wt 214 lb (97.1 kg)   SpO2 96%   BMI 30.71 kg/m   Wt Readings from Last 3 Encounters:  12/10/23 214 lb (97.1 kg)  11/16/23 214 lb (97.1 kg)  08/26/23 212 lb 12.8 oz (96.5 kg)    Physical Exam Vitals and nursing note reviewed.  Constitutional:      General: She is not in acute distress.    Appearance: She is well-developed. She is not  diaphoretic.  Eyes:     Conjunctiva/sclera: Conjunctivae normal.  Cardiovascular:     Rate and Rhythm: Normal rate and regular rhythm.     Heart sounds: Normal heart sounds. No murmur heard. Pulmonary:     Effort: Pulmonary effort is normal. No respiratory distress.     Breath sounds: Normal breath sounds. No wheezing.  Skin:  General: Skin is warm and dry.     Findings: No rash.  Neurological:     Mental Status: She is alert and oriented to person, place, and time.     Coordination: Coordination normal.  Psychiatric:        Behavior: Behavior normal.     Assessment & Plan:   Problem List Items Addressed This Visit       Digestive   Gastroesophageal reflux disease without esophagitis   Relevant Medications   pantoprazole  (PROTONIX ) 40 MG tablet     Endocrine   Type 2 diabetes mellitus with other specified complication (HCC) - Primary   Relevant Medications   ezetimibe  (ZETIA ) 10 MG tablet   gabapentin  (NEURONTIN ) 600 MG tablet   rosuvastatin  (CRESTOR ) 5 MG tablet   Other Relevant Orders   Bayer DCA Hb A1c Waived     Other   Mixed hyperlipidemia (Chronic)   Relevant Medications   ezetimibe  (ZETIA ) 10 MG tablet   rosuvastatin  (CRESTOR ) 5 MG tablet   Generalized anxiety disorder   Relevant Medications   buPROPion  (WELLBUTRIN  XL) 150 MG 24 hr tablet   Other Visit Diagnoses       Chronic obstructive pulmonary disease, unspecified COPD type (HCC)       Relevant Medications   albuterol  (PROVENTIL ) (2.5 MG/3ML) 0.083% nebulizer solution   albuterol  (VENTOLIN  HFA) 108 (90 Base) MCG/ACT inhaler       A1c 6.6.  Blood pressure is on the lower side but looks good.  She denies any lightheadedness or dizziness.  Will switch from citalopram  to Wellbutrin . Follow up plan: Return in about 3 months (around 03/11/2024), or if symptoms worsen or fail to improve, for Diabetes recheck.  Counseling provided for all of the vaccine components Orders Placed This Encounter   Procedures   Bayer DCA Hb A1c Waived    Jolyne Needs, MD [facility] 12/10/2023, 2:02 PM

## 2023-12-11 ENCOUNTER — Encounter: Payer: Self-pay | Admitting: Family Medicine

## 2023-12-22 ENCOUNTER — Ambulatory Visit (INDEPENDENT_AMBULATORY_CARE_PROVIDER_SITE_OTHER)

## 2023-12-22 DIAGNOSIS — E538 Deficiency of other specified B group vitamins: Secondary | ICD-10-CM

## 2023-12-22 NOTE — Progress Notes (Signed)
 Patient is in office today for a nurse visit for B12 Injection. Patient Injection was given in the  Left deltoid. Patient tolerated injection well.

## 2024-01-07 ENCOUNTER — Other Ambulatory Visit: Payer: Self-pay | Admitting: Family Medicine

## 2024-01-07 DIAGNOSIS — J449 Chronic obstructive pulmonary disease, unspecified: Secondary | ICD-10-CM

## 2024-01-13 ENCOUNTER — Telehealth: Payer: Self-pay | Admitting: Pharmacist

## 2024-01-13 NOTE — Telephone Encounter (Signed)
 Any approval on Jardiance ?

## 2024-01-13 NOTE — Progress Notes (Signed)
 Pharmacy Medication Assistance Program Note    01/13/2024  Patient ID: [name], female  DOB: [birth year], 68 y.o.  MRN:  [id number]     09/04/2023  Outreach Medication Two  Manufacturer Medication Two [employer]  [employer] Drugs Jardiance   Dose of Jardiance  25MG   Type of Radiographer, therapeutic Assistance  Date Application Sent to Patient 09/09/2023  Application Items Requested Application  Date Application Sent to Prescriber 10/02/2023  Date Application Received From Patient 09/29/2023  Application Items Received From Patient Application  Date Application Received From Provider 10/06/2023  Method Application Sent to Manufacturer Fax  Date Application Submitted to Manufacturer 10/13/2023  Patient Assistance Determination Approved  Approval Start Date 10/19/2023     APPROVED

## 2024-01-13 NOTE — Telephone Encounter (Signed)
   This patient is appearing on a report for being at risk of failing the adherence measure for diabetes medications this calendar year.   Medication: Jardiance  Last fill date: 10/12/23 for 30 day supply  Pending approval from patient assistance; will fail quality metric  Marvell Slider, PharmD, BCACP, CPP Clinical Pharmacist, [facility]

## 2024-01-25 ENCOUNTER — Ambulatory Visit (INDEPENDENT_AMBULATORY_CARE_PROVIDER_SITE_OTHER): Admitting: *Deleted

## 2024-01-25 DIAGNOSIS — E538 Deficiency of other specified B group vitamins: Secondary | ICD-10-CM

## 2024-01-25 DIAGNOSIS — D518 Other vitamin B12 deficiency anemias: Secondary | ICD-10-CM

## 2024-01-25 NOTE — Progress Notes (Signed)
 Patient is in office today for a nurse visit for B12 Injection. Patient Injection was given in the  Right deltoid. Patient tolerated injection well.

## 2024-01-27 ENCOUNTER — Telehealth: Payer: Self-pay

## 2024-01-27 NOTE — Telephone Encounter (Signed)
 Ozempic  #4 boxes received from pt assistance. Left message on pts cell to come by and pick up. Medication placed in lab fridge.

## 2024-02-04 ENCOUNTER — Other Ambulatory Visit: Payer: Self-pay | Admitting: Family Medicine

## 2024-02-04 DIAGNOSIS — J449 Chronic obstructive pulmonary disease, unspecified: Secondary | ICD-10-CM

## 2024-02-19 DIAGNOSIS — M7062 Trochanteric bursitis, left hip: Secondary | ICD-10-CM | POA: Diagnosis not present

## 2024-02-24 ENCOUNTER — Ambulatory Visit (INDEPENDENT_AMBULATORY_CARE_PROVIDER_SITE_OTHER)

## 2024-02-24 DIAGNOSIS — D518 Other vitamin B12 deficiency anemias: Secondary | ICD-10-CM | POA: Diagnosis not present

## 2024-02-24 DIAGNOSIS — E538 Deficiency of other specified B group vitamins: Secondary | ICD-10-CM

## 2024-02-24 NOTE — Progress Notes (Signed)
 Patient is in office today for a nurse visit for B12 Injection. Patient Injection was given in the  Left deltoid. Patient tolerated injection well.

## 2024-02-25 LAB — VITAMIN B12: Vitamin B-12: 478 pg/mL (ref 232–1245)

## 2024-03-03 ENCOUNTER — Ambulatory Visit: Payer: Self-pay | Admitting: Family Medicine

## 2024-03-11 ENCOUNTER — Encounter: Payer: Self-pay | Admitting: Family Medicine

## 2024-03-11 ENCOUNTER — Ambulatory Visit: Admitting: Family Medicine

## 2024-03-11 VITALS — BP 114/48 | HR 82 | Ht 70.0 in | Wt 212.0 lb

## 2024-03-11 DIAGNOSIS — Z7984 Long term (current) use of oral hypoglycemic drugs: Secondary | ICD-10-CM | POA: Diagnosis not present

## 2024-03-11 DIAGNOSIS — R5382 Chronic fatigue, unspecified: Secondary | ICD-10-CM | POA: Diagnosis not present

## 2024-03-11 DIAGNOSIS — E782 Mixed hyperlipidemia: Secondary | ICD-10-CM

## 2024-03-11 DIAGNOSIS — F411 Generalized anxiety disorder: Secondary | ICD-10-CM

## 2024-03-11 DIAGNOSIS — J449 Chronic obstructive pulmonary disease, unspecified: Secondary | ICD-10-CM

## 2024-03-11 DIAGNOSIS — K219 Gastro-esophageal reflux disease without esophagitis: Secondary | ICD-10-CM

## 2024-03-11 DIAGNOSIS — E1169 Type 2 diabetes mellitus with other specified complication: Secondary | ICD-10-CM

## 2024-03-11 DIAGNOSIS — R1013 Epigastric pain: Secondary | ICD-10-CM | POA: Diagnosis not present

## 2024-03-11 LAB — BAYER DCA HB A1C WAIVED: HB A1C (BAYER DCA - WAIVED): 6.8 % — ABNORMAL HIGH (ref 4.8–5.6)

## 2024-03-11 LAB — LIPID PANEL

## 2024-03-11 NOTE — Progress Notes (Signed)
 BP (!) 114/48   Pulse 82   Ht 5' 10 (1.778 m)   Wt 212 lb (96.2 kg)   SpO2 94%   BMI 30.42 kg/m    Subjective:   Patient ID: [name], female    DOB: 05-05-1956, 68 y.o.   MRN: [id number]  HPI: [name] is a 68 y.o. female presenting on 03/11/2024 for Medical Management of Chronic Issues, Diabetes, and Hyperlipidemia   Discussed the use of AI scribe software for clinical note transcription with the patient, who gave verbal consent to proceed.  History of Present Illness   [name] is a 68 year old female with depression and sleep apnea who presents with persistent fatigue and gastrointestinal symptoms.  She experiences persistent fatigue characterized by a constant feeling of tiredness. She has a history of depression and was previously on Wellbutrin , which she discontinued after three weeks due to increased crying and irritability. She restarted Celexa  three weeks after stopping Wellbutrin  and reports improved mood but persistent fatigue. Despite sleeping well at night, she has a history of mild sleep apnea diagnosed ten years ago, which was not treated. She experiences daytime sleepiness, sometimes falling asleep at the table.  She is currently taking Ozempic  1 mg and Jardiance  for diabetes but is unsure of her blood sugar levels. She experiences significant reflux and vomiting and suspects she may have an ulcer. Her father had a history of severe ulcers. She takes Protonix  daily and plans to undergo a stool test for H. pylori. She reports upper abdominal pain and vomiting.  She has COPD and uses Symbicort  and albuterol  as needed. She experiences frequent colds, likely contracted from her great-granddaughter, which result in prolonged coughing. She uses Mucinex  as needed for sinus-related symptoms.  She is on Crestor  and Zetia  for cholesterol management and reports no issues with these medications.        Relevant past medical, surgical, family and social  history reviewed and updated as indicated. Interim medical history since our last visit reviewed. Allergies and medications reviewed and updated.  Review of Systems  Constitutional:  Positive for fatigue. Negative for chills and fever.  HENT:  Negative for congestion, ear discharge and ear pain.   Eyes:  Negative for redness and visual disturbance.  Respiratory:  Negative for chest tightness and shortness of breath.   Cardiovascular:  Negative for chest pain and leg swelling.  Genitourinary:  Negative for difficulty urinating and dysuria.  Skin:  Negative for rash.  Neurological:  Negative for dizziness, light-headedness and headaches.  Psychiatric/Behavioral:  Negative for agitation, behavioral problems, dysphoric mood, self-injury, sleep disturbance and suicidal ideas. The patient is not nervous/anxious.   All other systems reviewed and are negative.   Per HPI unless specifically indicated above   Allergies as of 03/11/2024       Reactions   Fenofibrate Other (See Comments)   Flu-like    Penicillin G Other (See Comments)   Blisters in mouth   Pravastatin Other (See Comments)   flu like symptoms    Statins         Medication List        Accurate as of March 11, 2024 11:54 AM. If you have any questions, ask your nurse or doctor.          STOP taking these medications    buPROPion  150 MG 24 hr tablet Commonly known as: Wellbutrin  XL Stopped by: Fonda LABOR Brindy Higginbotham   mupirocin  ointment 2 % Commonly known  as: BACTROBAN  Stopped by: Fonda LABOR Annais Crafts       TAKE these medications    albuterol  (2.5 MG/3ML) 0.083% nebulizer solution Commonly known as: PROVENTIL  Take 3 mLs (2.5 mg total) by nebulization every 6 (six) hours as needed for wheezing or shortness of breath.   albuterol  108 (90 Base) MCG/ACT inhaler Commonly known as: Ventolin  HFA Inhale 2 puffs into the lungs every 6 (six) hours as needed. For shortness of breath   citalopram  10 MG tablet Commonly  known as: CELEXA  Take 10 mg by mouth daily.   cycloSPORINE  0.05 % ophthalmic emulsion Commonly known as: RESTASIS  Place 1 drop into both eyes 2 (two) times daily.   ezetimibe  10 MG tablet Commonly known as: ZETIA  Take 1 tablet (10 mg total) by mouth daily.   gabapentin  600 MG tablet Commonly known as: NEURONTIN  TAKE ONE (1) TABLET THREE (3) TIMES EACH DAY   Jardiance  25 MG Tabs tablet Generic drug: empagliflozin  TAKE 1 TABLET BY MOUTH EVERY MORNING BEFORE BREAKFAST   K-Y Lubricating Gel Apply 1 g topically daily.   nitroGLYCERIN  0.4 MG SL tablet Commonly known as: Nitrostat  Place 1 tablet (0.4 mg total) under the tongue every 5 (five) minutes x 3 doses as needed (if no relief after 3 dose proceed to ED or call 911).   pantoprazole  40 MG tablet Commonly known as: PROTONIX  TAKE ONE (1) TABLET EACH DAY   rosuvastatin  5 MG tablet Commonly known as: Crestor  Take 1 tablet (5 mg total) by mouth daily. Takes one every other day   Semaglutide  (1 MG/DOSE) 4 MG/3ML Sopn Inject 1 mg as directed once a week.   Symbicort  160-4.5 MCG/ACT inhaler Generic drug: budesonide -formoterol  USE 2 PUFFS TWICE DAILY         Objective:   BP (!) 114/48   Pulse 82   Ht 5' 10 (1.778 m)   Wt 212 lb (96.2 kg)   SpO2 94%   BMI 30.42 kg/m   Wt Readings from Last 3 Encounters:  03/11/24 212 lb (96.2 kg)  12/10/23 214 lb (97.1 kg)  11/16/23 214 lb (97.1 kg)    Physical Exam Physical Exam   NECK: Thyroid  normal. CHEST: Lungs clear to auscultation bilaterally. CARDIOVASCULAR: Heart regular rate and rhythm. ABDOMEN: Tenderness in upper abdomen. No tenderness in lower abdomen. No tenderness in chest.         Assessment & Plan:   Problem List Items Addressed This Visit       Respiratory   COPD  GOLD II/ AB     Digestive   Gastroesophageal reflux disease without esophagitis     Endocrine   Type 2 diabetes mellitus with other specified complication (HCC) - Primary   Relevant  Orders   Bayer DCA Hb A1c Waived   CBC with Differential/Platelet   CMP14+EGFR   Lipid panel   Microalbumin/Creatinine Ratio, Urine     Other   Mixed hyperlipidemia (Chronic)   Relevant Orders   Bayer DCA Hb A1c Waived   CBC with Differential/Platelet   CMP14+EGFR   Lipid panel   Generalized anxiety disorder   Relevant Medications   citalopram  (CELEXA ) 10 MG tablet   Other Visit Diagnoses       Epigastric pain       Relevant Orders   H. pylori antigen, stool     Chronic fatigue       Relevant Orders   Ambulatory referral to Sleep Studies     Assessment and Plan    Epigastric pain  with vomiting, possible peptic ulcer disease and gastroesophageal reflux disease Epigastric pain and vomiting likely due to peptic ulcer disease and gastroesophageal reflux disease. Family history of severe ulcer complications. Differential includes H. pylori infection. - Order stool test for H. pylori. - Double Protonix  dosage for one week. - Advise use of milk for symptomatic relief. - Consider referral to Dr. Marysue if stool test is negative. - Discuss potential for concurrent colonoscopy and endoscopy if referral is needed.  Obstructive sleep apnea, previously diagnosed, not currently treated Mild obstructive sleep apnea untreated for ten years with excessive daytime sleepiness. Possible worsening with age. Differential includes narcolepsy. - Refer to sleep specialist in [place] for evaluation. - Arrange home sleep study due to convenience and insurance coverage.  Chronic obstructive pulmonary disease COPD managed with Symbicort  and albuterol . Frequent colds from exposure to great-granddaughter. - Advise use of Mucinex  as needed for symptomatic relief. - Encourage prompt clinic visit if symptoms worsen or involve lower respiratory tract.  Type 2 diabetes mellitus Type 2 diabetes managed with Ozempic  and Jardiance . Awaiting A1c results for assessment. - Check A1c level.  A1c is  6.8.  Depression Depression improved with Celexa  after Wellbutrin  was ineffective. Persistent fatigue likely related to untreated sleep apnea.  Hyperlipidemia Hyperlipidemia managed with Crestor  and Zetia . Awaiting lipid panel results for assessment. - Check lipid panel.         Follow up plan: Return in about 3 months (around 06/11/2024), or if symptoms worsen or fail to improve, for Diabetes recheck.  Counseling provided for all of the vaccine components Orders Placed This Encounter  Procedures   Bayer DCA Hb A1c Waived   CBC with Differential/Platelet   CMP14+EGFR   Lipid panel   Microalbumin/Creatinine Ratio, Urine   H. pylori antigen, stool   Ambulatory referral to Sleep Studies    Fonda Levins, MD [facility] 03/11/2024, 11:54 AM

## 2024-03-12 LAB — CBC WITH DIFFERENTIAL/PLATELET
Basophils Absolute: 0.1 x10E3/uL (ref 0.0–0.2)
Basos: 1 %
EOS (ABSOLUTE): 0.1 x10E3/uL (ref 0.0–0.4)
Eos: 1 %
Hematocrit: 47.7 % — ABNORMAL HIGH (ref 34.0–46.6)
Hemoglobin: 15.1 g/dL (ref 11.1–15.9)
Immature Grans (Abs): 0 x10E3/uL (ref 0.0–0.1)
Immature Granulocytes: 0 %
Lymphocytes Absolute: 2.2 x10E3/uL (ref 0.7–3.1)
Lymphs: 23 %
MCH: 29.4 pg (ref 26.6–33.0)
MCHC: 31.7 g/dL (ref 31.5–35.7)
MCV: 93 fL (ref 79–97)
Monocytes Absolute: 0.5 x10E3/uL (ref 0.1–0.9)
Monocytes: 5 %
Neutrophils Absolute: 6.6 x10E3/uL (ref 1.4–7.0)
Neutrophils: 70 %
Platelets: 236 x10E3/uL (ref 150–450)
RBC: 5.13 x10E6/uL (ref 3.77–5.28)
RDW: 12.7 % (ref 11.7–15.4)
WBC: 9.5 x10E3/uL (ref 3.4–10.8)

## 2024-03-12 LAB — CMP14+EGFR
ALT: 17 IU/L (ref 0–32)
AST: 11 IU/L (ref 0–40)
Albumin: 3.9 g/dL (ref 3.9–4.9)
Alkaline Phosphatase: 86 IU/L (ref 44–121)
BUN/Creatinine Ratio: 13 (ref 12–28)
BUN: 12 mg/dL (ref 8–27)
Bilirubin Total: 0.5 mg/dL (ref 0.0–1.2)
CO2: 24 mmol/L (ref 20–29)
Calcium: 9.1 mg/dL (ref 8.7–10.3)
Chloride: 104 mmol/L (ref 96–106)
Creatinine, Ser: 0.95 mg/dL (ref 0.57–1.00)
Globulin, Total: 2.1 g/dL (ref 1.5–4.5)
Glucose: 115 mg/dL — ABNORMAL HIGH (ref 70–99)
Potassium: 4.1 mmol/L (ref 3.5–5.2)
Sodium: 142 mmol/L (ref 134–144)
Total Protein: 6 g/dL (ref 6.0–8.5)
eGFR: 66 mL/min/1.73 (ref >59)

## 2024-03-12 LAB — MICROALBUMIN / CREATININE URINE RATIO
Creatinine, Urine: 104.6 mg/dL
Microalb/Creat Ratio: 7 mg/g{creat} (ref 0–29)
Microalbumin, Urine: 6.8 ug/mL

## 2024-03-12 LAB — LIPID PANEL
Chol/HDL Ratio: 2.8 ratio (ref 0.0–4.4)
Cholesterol, Total: 173 mg/dL (ref 100–199)
HDL: 61 mg/dL (ref >39)
LDL Chol Calc (NIH): 93 mg/dL (ref 0–99)
Triglycerides: 105 mg/dL (ref 0–149)
VLDL Cholesterol Cal: 19 mg/dL (ref 5–40)

## 2024-03-14 ENCOUNTER — Other Ambulatory Visit

## 2024-03-14 DIAGNOSIS — R1013 Epigastric pain: Secondary | ICD-10-CM | POA: Diagnosis not present

## 2024-03-16 ENCOUNTER — Ambulatory Visit: Payer: Self-pay | Admitting: Family Medicine

## 2024-03-16 DIAGNOSIS — R1013 Epigastric pain: Secondary | ICD-10-CM

## 2024-03-16 LAB — H. PYLORI ANTIGEN, STOOL: H pylori Ag, Stl: NEGATIVE

## 2024-03-28 ENCOUNTER — Ambulatory Visit (INDEPENDENT_AMBULATORY_CARE_PROVIDER_SITE_OTHER)

## 2024-03-28 DIAGNOSIS — E538 Deficiency of other specified B group vitamins: Secondary | ICD-10-CM

## 2024-03-28 DIAGNOSIS — D518 Other vitamin B12 deficiency anemias: Secondary | ICD-10-CM | POA: Diagnosis not present

## 2024-03-28 NOTE — Progress Notes (Signed)
 Patient is in office today for a nurse visit for B12 Injection. Patient Injection was given in the  Right deltoid. Patient tolerated injection well.

## 2024-04-07 DIAGNOSIS — G4733 Obstructive sleep apnea (adult) (pediatric): Secondary | ICD-10-CM | POA: Diagnosis not present

## 2024-04-08 DIAGNOSIS — G4733 Obstructive sleep apnea (adult) (pediatric): Secondary | ICD-10-CM | POA: Diagnosis not present

## 2024-04-13 ENCOUNTER — Ambulatory Visit: Payer: Self-pay | Admitting: *Deleted

## 2024-04-13 NOTE — Telephone Encounter (Signed)
 Copied from CRM #[id number]. Topic: Clinical - Red Word Triage >> Apr 13, 2024 11:46 AM Myrick T wrote: Red Word that prompted transfer to Nurse Triage: patient called stated her pain is getting worse and she is now vomiting more often with the pain. Reason for Disposition  Abdominal pain is a chronic symptom (recurrent or ongoing AND present > 4 weeks)    GI referral was not put in back on Aug. 7 or 8th.   It's pending now.   Pt is having abd pain with vomiting.  Been seeing Dr. Maryanne for it.   Calling regarding the GI referral.  Answer Assessment - Initial Assessment Questions 1. LOCATION: Where does it hurt?      I'm having pain in my stomach and I'm vomiting.   Dr. Maryanne made a referral for me to see a GI doctor on Aug. 7 or 8.   The referral was never put in when I called the GI doctor.   It's pending now per the lady I just spoke to prior to you.    How long will it be before I can be seen  by the GI doctor?  Another month or so?  I know it's hard to get in to see them.   I don't understand why the referral was not put in. No triage done as she has been seen for this and was calling to check on the referral to the GI doctor.    2. RADIATION: Does the pain shoot anywhere else? (e.g., chest, back)      3. ONSET: When did the pain begin? (e.g., minutes, hours or days ago)       4. SUDDEN: Gradual or sudden onset?      5. PATTERN Does the pain come and go, or is it constant?      6. SEVERITY: How bad is the pain?  (e.g., Scale 1-10; mild, moderate, or severe)      7. RECURRENT SYMPTOM: Have you ever had this type of stomach pain before? If Yes, ask: When was the last time? and What happened that time?       8. CAUSE: What do you think is causing the stomach pain? (e.g., gallstones, recent abdominal surgery)      9. RELIEVING/AGGRAVATING FACTORS: What makes it better or worse? (e.g., antacids, bending or twisting motion, bowel movement)      10. OTHER  SYMPTOMS: Do you have any other symptoms? (e.g., back pain, diarrhea, fever, urination pain, vomiting)        11. PREGNANCY: Is there any chance you are pregnant? When was your last menstrual period?  Protocols used: Abdominal Pain - Female-A-AH FYI Only or Action Required?: Action required by provider: referral request and update on patient condition.  Patient was last seen in primary care on 03/11/2024 by [name], Fonda LABOR, MD.  Called Nurse Triage reporting Abdominal Pain. Still having the abd pain and vomiting.   Symptoms began about a month ago.  Interventions attempted: Other: GI practice never got a referral request from Dr. Williemae office. When pt called..  Looks like the GI referral is pending.   She wants to know if she still has to wait a long time to be seen by the GI doctor since the referral has not been put in?    Symptoms are: gradually worsening The pain is causing her to vomit.  Triage Disposition: Call PCP Now  Patient/caregiver understands and will follow disposition?: Yes

## 2024-04-13 NOTE — Telephone Encounter (Signed)
 Referral has been sent to Dr. Pamula Office as Patient requested.  MyChart Message sent to Patient with Specialty Office contact information.

## 2024-04-14 ENCOUNTER — Telehealth: Payer: Self-pay

## 2024-04-14 ENCOUNTER — Encounter: Payer: Self-pay | Admitting: Physician Assistant

## 2024-04-14 NOTE — Telephone Encounter (Signed)
 In process of completing [facility] refills for patients OZEMPIC  1MG  medication.  Application faxed to [employer]. for signature.

## 2024-04-25 ENCOUNTER — Other Ambulatory Visit: Payer: Self-pay | Admitting: Family Medicine

## 2024-04-25 DIAGNOSIS — J449 Chronic obstructive pulmonary disease, unspecified: Secondary | ICD-10-CM

## 2024-04-25 NOTE — Telephone Encounter (Signed)
 Faxed Ozempic  1mg  dose pen refills to [facility].

## 2024-04-28 ENCOUNTER — Ambulatory Visit (INDEPENDENT_AMBULATORY_CARE_PROVIDER_SITE_OTHER)

## 2024-04-28 DIAGNOSIS — E538 Deficiency of other specified B group vitamins: Secondary | ICD-10-CM

## 2024-04-28 DIAGNOSIS — D518 Other vitamin B12 deficiency anemias: Secondary | ICD-10-CM | POA: Diagnosis not present

## 2024-04-28 NOTE — Progress Notes (Signed)
 Patient presented to office for B-12 injection. Tolerated with no problems

## 2024-05-30 ENCOUNTER — Ambulatory Visit: Admitting: *Deleted

## 2024-05-30 DIAGNOSIS — D518 Other vitamin B12 deficiency anemias: Secondary | ICD-10-CM | POA: Diagnosis not present

## 2024-05-30 DIAGNOSIS — E538 Deficiency of other specified B group vitamins: Secondary | ICD-10-CM

## 2024-05-30 NOTE — Progress Notes (Signed)
 Patient is in office today for a nurse visit for B12 Injection. Patient Injection was given in the  Right deltoid. Patient tolerated injection well.

## 2024-05-31 ENCOUNTER — Ambulatory Visit (HOSPITAL_COMMUNITY)
Admission: RE | Admit: 2024-05-31 | Discharge: 2024-05-31 | Disposition: A | Discharge status: Home / Self Care | Source: Ambulatory Visit | Attending: Acute Care | Admitting: Acute Care

## 2024-05-31 DIAGNOSIS — Z87891 Personal history of nicotine dependence: Secondary | ICD-10-CM | POA: Insufficient documentation

## 2024-05-31 DIAGNOSIS — Z122 Encounter for screening for malignant neoplasm of respiratory organs: Secondary | ICD-10-CM | POA: Insufficient documentation

## 2024-05-31 DIAGNOSIS — F17210 Nicotine dependence, cigarettes, uncomplicated: Secondary | ICD-10-CM | POA: Insufficient documentation

## 2024-06-03 ENCOUNTER — Encounter: Payer: Self-pay | Admitting: Physician Assistant

## 2024-06-03 ENCOUNTER — Ambulatory Visit: Admitting: Physician Assistant

## 2024-06-03 ENCOUNTER — Other Ambulatory Visit (INDEPENDENT_AMBULATORY_CARE_PROVIDER_SITE_OTHER)

## 2024-06-03 VITALS — BP 120/64 | HR 78 | Ht 70.0 in | Wt 223.0 lb

## 2024-06-03 DIAGNOSIS — R1013 Epigastric pain: Secondary | ICD-10-CM

## 2024-06-03 DIAGNOSIS — R112 Nausea with vomiting, unspecified: Secondary | ICD-10-CM | POA: Diagnosis not present

## 2024-06-03 DIAGNOSIS — R1084 Generalized abdominal pain: Secondary | ICD-10-CM

## 2024-06-03 DIAGNOSIS — R194 Change in bowel habit: Secondary | ICD-10-CM

## 2024-06-03 DIAGNOSIS — Z800 Family history of malignant neoplasm of digestive organs: Secondary | ICD-10-CM | POA: Diagnosis not present

## 2024-06-03 LAB — BUN/CREATININE RATIO
BUN: 10 mg/dL (ref 7–25)
Creat: 0.78 mg/dL (ref 0.50–1.05)
eGFR: 84 mL/min/1.73m2 (ref >=60)

## 2024-06-03 MED ORDER — NA SULFATE-K SULFATE-MG SULF 17.5-3.13-1.6 GM/177ML PO SOLN: 1.0000 | Freq: Once | ORAL | 0 refills | Status: AC

## 2024-06-03 MED ORDER — ONDANSETRON HCL 4 MG PO TABS: 4.0000 mg | ORAL_TABLET | Freq: Three times a day (TID) | ORAL | 0 refills | Status: AC | PRN

## 2024-06-03 NOTE — Patient Instructions (Addendum)
 Your provider has requested that you go to the basement level for lab work before leaving today. Press B on the elevator. The lab is located at the first door on the left as you exit the elevator.  You have been scheduled for a CT scan of the abdomen and pelvis at [facility], 1st floor Radiology. You are scheduled on 06/21/24 at 1:30 pm. You should arrive 15 minutes prior to your appointment time for registration. If you have any questions regarding your exam or if you need to reschedule, you may call [facility] at [phone] between the hours of 8:00 am and 5:00 pm, Monday-Friday.   You have been scheduled for an Endoscopy and Colonoscopy. Please follow the written instructions given to you at your visit today.  If you use inhalers (even only as needed), please bring them with you on the day of your procedure.  DO NOT TAKE 7 DAYS PRIOR TO TEST- Trulicity (dulaglutide) Ozempic , Wegovy  (semaglutide ) Mounjaro (tirzepatide) Bydureon Bcise (exanatide extended release)  DO NOT TAKE 1 DAY PRIOR TO YOUR TEST Rybelsus  (semaglutide ) Adlyxin (lixisenatide) Victoza (liraglutide) Byetta (exanatide) ___________________________________________________________________________  Please follow up sooner if symptoms increase or worsen __________________________________________________________________________  Due to recent changes in healthcare laws, you may see the results of your imaging and laboratory studies on MyChart before your provider has had a chance to review them.  We understand that in some cases there may be results that are confusing or concerning to you. Not all laboratory results come back in the same time frame and the provider may be waiting for multiple results in order to interpret others.  Please give us  48 hours in order for your provider to thoroughly review all the results before contacting the office for clarification of your results.   Thank you for trusting  me with your gastrointestinal care!   Ellouise Console, PA-C _______________________________________________________  If your blood pressure at your visit was 140/90 or greater, please contact your primary care physician to follow up on this.  _______________________________________________________  If you are age 93 or older, your body mass index should be between 23-30. Your Body mass index is 32 kg/m. If this is out of the aforementioned range listed, please consider follow up with your Primary Care Provider.  If you are age 71 or younger, your body mass index should be between 19-25. Your Body mass index is 32 kg/m. If this is out of the aformentioned range listed, please consider follow up with your Primary Care Provider.   ________________________________________________________  The [facility] providers would like to encourage you to use MYCHART to communicate with providers for non-urgent requests or questions.  Due to long hold times on the telephone, sending your provider a message by [facility] may be a faster and more efficient way to get a response.  Please allow 48 business hours for a response.  Please remember that this is for non-urgent requests.  _______________________________________________________

## 2024-06-03 NOTE — Progress Notes (Signed)
 Ellouise Console, PA-C [address] [place], [place]  [address] Phone: [phone]   Gastroenterology Consultation  Referring Provider:     [name], Fonda LABOR, MD Primary Care Physician:  [name], Fonda LABOR, MD Primary Gastroenterologist:  Ellouise Console, PA-C / Dr. Gordy Starch  Reason for Consultation:     Epigastric pain, change in bowel habits, weight loss        HPI:   68 year old female, new patient, is referred by her PCP to evaluate epigastric pain. Previous patient of GI Dr. Golda.  Transferring to our office.  Patient request to establish care with Dr. Starch.  She had prior cholecystectomy in 1999.  Prior hysterectomy and appendectomy.   No recent abdominal imaging (US  or CT).  Currently on Ozempic  (semaglutide ) for over a year.  She has intentionally lost weight in the past year on medication and to help chronic health conditions.  She describes pain in the epigastrium which radiates to the left upper quadrant and right upper quadrant.  Pain comes and goes.  It is worse with stress.  Not affected by eating.  She also has irregular bowel habits with diarrhea alternating with constipation.  She denies melena or hematochezia.  She had similar episode of epigastric pain in 2016.  Has taken MiraLAX  for constipation in the past.  Her son has severe celiac disease and is followed by Dr. Starch.  Her grandmother had colon cancer in her 53s.  Uncle died of esophageal cancer.  She saw her PCP 03/11/2024 Dr. Maryanne for epigastric pain and GERD.  Has been treated with pantoprazole  40 mg once daily.    03/2024 H. pylori stool test negative.  CBC and CMP labs normal.  Hgb 15.1.  07/2021: Negative Celiac labs.  07/2015 last colonoscopy by Dr. Golda: Results unavailable.  PMH: COPD, multiple sclerosis, diabetes, neuropathy, psoriasis, GERD, migraines, hyperlipidemia, anxiety, and sleep apnea.  08/2023 echo LVEF 55 to 60%.  Past Medical History:  Diagnosis Date   Anxiety     Arthritis    Complication of anesthesia    COPD (chronic obstructive pulmonary disease) (HCC)    Depression    Diabetes mellitus without complication (HCC)    Gallstones    GERD (gastroesophageal reflux disease)    History of skin cancer    Migraine    Mixed hyperlipidemia    Multiple sclerosis    Left-sided weakness   Neuropathy    Obesity    PONV (postoperative nausea and vomiting)    Psoriasis    Seasonal allergies    Sleep apnea     Past Surgical History:  Procedure Laterality Date   ABDOMINAL HYSTERECTOMY  1984   CHOLECYSTECTOMY  1999   COLONOSCOPY N/A 08/02/2015   Procedure: COLONOSCOPY;  Surgeon: Claudis RAYMOND Golda, MD;  [facility]: [facility];  Service: Endoscopy;  Laterality: N/A;  12:00-moved to 1220 Ann to notify pt   KNEE ARTHROSCOPY  2003   RT   LAPAROSCOPIC APPENDECTOMY N/A 02/12/2017   Procedure: APPENDECTOMY LAPAROSCOPIC;  Surgeon: Mavis Anes, MD;  Location: [facility];  Service: General;  Laterality: N/A;   Left breast lumpectomy  1985   Left carpal tunnel release Left 1990   PARTIAL KNEE ARTHROPLASTY Left 01/22/2015   Procedure: LEFT KNEE MEDIAL UNICOMPARTMENTAL KNEE;  Surgeon: Dempsey Moan, MD;  Location: [facility];  Service: Orthopedics;  Laterality: Left;   Right total knee replacement  2010   SHOULDER SURGERY Left 2008   Sinus cyst removal  age 32   TOTAL HIP ARTHROPLASTY Right 05/14/2022   Procedure: TOTAL HIP ARTHROPLASTY ANTERIOR APPROACH;  Surgeon: Melodi Lerner, MD;  Location: [facility];  Service: Orthopedics;  Laterality: Right;   TOTAL KNEE REVISION  05/05/2012   Procedure: TOTAL KNEE REVISION;  Surgeon: Lerner LULLA Melodi, MD;  Location: [facility];  Service: Orthopedics;  Laterality: Right;   WRIST SURGERY Right 2023   rt    Prior to Admission medications   Medication Sig Start Date End Date Taking? Authorizing Provider  albuterol  (PROVENTIL ) (2.5 MG/3ML) 0.083% nebulizer solution Take 3 mLs (2.5 mg total) by nebulization every 6 (six) hours as  needed for wheezing or shortness of breath. 12/10/23   [name], Fonda LABOR, MD  albuterol  (VENTOLIN  HFA) 108 (90 Base) MCG/ACT inhaler Inhale 2 puffs into the lungs every 6 (six) hours as needed. For shortness of breath 12/10/23   [name], Fonda LABOR, MD  citalopram  (CELEXA ) 10 MG tablet Take 10 mg by mouth daily.    [provider]  cycloSPORINE  (RESTASIS ) 0.05 % ophthalmic emulsion Place 1 drop into both eyes 2 (two) times daily.    [provider]  ezetimibe  (ZETIA ) 10 MG tablet Take 1 tablet (10 mg total) by mouth daily. 12/10/23   [name], Fonda LABOR, MD  gabapentin  (NEURONTIN ) 600 MG tablet TAKE ONE (1) TABLET THREE (3) TIMES EACH DAY 12/10/23   [name], Fonda LABOR, MD  JARDIANCE  25 MG TABS tablet TAKE 1 TABLET BY MOUTH EVERY MORNING BEFORE BREAKFAST 08/17/23   [name], Fonda LABOR, MD  Lubricants (K-Y LUBRICATING) GEL Apply 1 g topically daily. 05/21/23   [name], Fonda LABOR, MD  nitroGLYCERIN  (NITROSTAT ) 0.4 MG SL tablet Place 1 tablet (0.4 mg total) under the tongue every 5 (five) minutes x 3 doses as needed (if no relief after 3 dose proceed to ED or call 911). 08/26/23   Mallipeddi, Vishnu P, MD  pantoprazole  (PROTONIX ) 40 MG tablet TAKE ONE (1) TABLET EACH DAY 12/10/23   [name], Fonda LABOR, MD  rosuvastatin  (CRESTOR ) 5 MG tablet Take 1 tablet (5 mg total) by mouth daily. Takes one every other day 12/10/23   [name], Fonda LABOR, MD  Semaglutide , 1 MG/DOSE, 4 MG/3ML SOPN Inject 1 mg as directed once a week. 05/01/23   [name], Fonda LABOR, MD  SYMBICORT  160-4.5 MCG/ACT inhaler USE 2 PUFFS TWICE DAILY 04/25/24   [name], Fonda LABOR, MD    Family History  Problem Relation Age of Onset   Dementia Mother    Thyroid  disease Mother        goiter    Diabetes Mother    Atrial fibrillation Father        Died with pulmonary embolus   Emphysema Father    Heart disease Father    Other Father        blood clots    Celiac disease Father    GI problems Father        dumping syndrome -  malabsorbtion    Ulcers Father        bleeding stomach ulcers   Other Sister        hereditary = blood clots    Parkinson's disease Brother    Alcohol  abuse Brother    Hypertension Brother    Cancer - Colon Maternal Grandmother    Dementia Maternal Grandmother    Heart disease Maternal Grandfather    Stroke Maternal Grandfather    Heart attack Paternal Grandmother    Other Paternal Grandfather  appendix rupture   Arthritis Son    Hypertension Son    Celiac disease Son    Esophageal cancer Maternal Uncle        spread to lungs and liver and he died in 6 months     Social History   Tobacco Use   Smoking status: Every Day    Current packs/day: 0.25    Average packs/day: 0.3 packs/day for 52.0 years (13.0 ttl pk-yrs)    Types: Cigarettes    Start date: 06/16/1972   Smokeless tobacco: Never   Tobacco comments:    3/4 pack a day sicne age 3  Vaping Use   Vaping status: Never Used  Substance Use Topics   Alcohol  use: No    Alcohol /week: 0.0 standard drinks of alcohol    Drug use: No    Allergies as of 06/03/2024 - Review Complete 06/03/2024  Allergen Reaction Noted   Fenofibrate Other (See Comments) 01/17/2013   Penicillin g Other (See Comments) 04/01/2017   Pravastatin Other (See Comments) 01/17/2013   Statins  07/23/2021    Review of Systems:    All systems reviewed and negative except where noted in HPI.   Physical Exam:  BP 120/64   Pulse 78   Ht 5' 10 (1.778 m)   Wt 223 lb (101.2 kg)   BMI 32.00 kg/m  No LMP recorded. Patient has had a hysterectomy.  General:   Alert,  Well-developed, well-nourished, pleasant and cooperative in NAD Lungs:  Respirations even and unlabored.  Clear throughout to auscultation.   No wheezes, crackles, or rhonchi. No acute distress. Heart:  Regular rate and rhythm; no murmurs, clicks, rubs, or gallops. Abdomen:  Normal bowel sounds.  No bruits.  Soft, and non-distended without masses, hepatosplenomegaly or hernias noted.   There is mild generalized tenderness throughout the entire upper and lower abdomen diffusely.  No guarding or rebound tenderness.    Neurologic:  Alert and oriented x3;  grossly normal neurologically. Psych:  Alert and cooperative. Normal mood and affect.   Imaging Studies: CT CHEST LUNG CA SCREEN LOW DOSE W/O CM Result Date: 06/03/2024 CLINICAL DATA:  68 year old female current smoker with 31 pack-year smoking history EXAM: CT CHEST WITHOUT CONTRAST LOW-DOSE FOR LUNG CANCER SCREENING TECHNIQUE: Multidetector CT imaging of the chest was performed following the standard protocol without IV contrast. RADIATION DOSE REDUCTION: This exam was performed according to the departmental dose-optimization program which includes automated exposure control, adjustment of the mA and/or kV according to patient size and/or use of iterative reconstruction technique. COMPARISON:  06/01/2023 screening chest CT FINDINGS: Cardiovascular: Normal heart size. No significant pericardial effusion/thickening. Three-vessel coronary atherosclerosis. Atherosclerotic nonaneurysmal thoracic aorta. Normal caliber pulmonary arteries. Mediastinum/Nodes: No significant thyroid  nodules. Unremarkable esophagus. No pathologically enlarged axillary, mediastinal or hilar lymph nodes, noting limited sensitivity for the detection of hilar adenopathy on this noncontrast study. Lungs/Pleura: No pneumothorax. No pleural effusion. Mild centrilobular emphysema with diffuse bronchial wall thickening. No acute consolidative airspace disease or lung masses. No significant growth of previously visualized small pulmonary nodules up to 4.2 mm in the right middle lobe on image 199. No new significant pulmonary nodules. Upper abdomen: Subcentimeter hypodense superior right liver lesion is too small to characterize and unchanged, considered benign. Musculoskeletal: No aggressive appearing focal osseous lesions. Moderate thoracic spondylosis. IMPRESSION: 1.  Lung-RADS 2, benign appearance or behavior. Continue annual screening with low-dose chest CT without contrast in 12 months. 2. Three-vessel coronary atherosclerosis. 3. Aortic Atherosclerosis (ICD10-I70.0) and Emphysema (ICD10-J43.9). Electronically Signed  By: Selinda DELENA Blue M.D.   On: 06/03/2024 14:31    Labs: CBC    Component Value Date/Time   WBC 9.5 03/11/2024 1104   WBC 17.8 (H) 05/15/2022 0358   RBC 5.13 03/11/2024 1104   RBC 4.16 05/15/2022 0358   HGB 15.1 03/11/2024 1104   HCT 47.7 (H) 03/11/2024 1104   PLT 236 03/11/2024 1104   MCV 93 03/11/2024 1104    CMP     Component Value Date/Time   NA 142 03/11/2024 1104   K 4.1 03/11/2024 1104   CL 104 03/11/2024 1104   CO2 24 03/11/2024 1104   GLUCOSE 115 (H) 03/11/2024 1104   GLUCOSE 162 (H) 05/15/2022 0358   BUN 10 06/03/2024 1237   BUN 12 03/11/2024 1104   CREATININE 0.78 06/03/2024 1237   CALCIUM  9.1 03/11/2024 1104   PROT 6.0 03/11/2024 1104   ALBUMIN 3.9 03/11/2024 1104   AST 11 03/11/2024 1104   ALT 17 03/11/2024 1104   ALKPHOS 86 03/11/2024 1104   BILITOT 0.5 03/11/2024 1104   GFRNONAA >60 05/15/2022 0358   GFRNONAA 85 01/10/2013 0900   GFRAA 109 07/26/2020 1608   GFRAA >89 01/10/2013 0900    Assessment and Plan:   CHARLYNN SALIH is a 68 y.o. y/o female has been referred for:  1.  Epigastric pain: Not controlled on pantoprazole  40 mg daily.  H. pylori stool test negative.  Prior cholecystectomy in 1999.  No previous EGD.  CBC and CMP labs were normal. - Scheduling EGD I discussed risks of EGD with patient to include risk of bleeding, perforation, and risk of sedation.  Patient expressed understanding and agrees to proceed with EGD.  - Avoid NSAIDs - Abdominal pelvic CT due to significant weight loss in the past year. - If GI symptoms persist, consider holding Ozempic  (semaglutide ) to see if symptoms improve  2.  GERD - Continue pantoprazole  40 mg daily - Recommend Lifestyle Modifications to prevent  Acid Reflux.  Rec. Avoid coffee, sodas, peppermint, garlic, onions, alcohol , citrus fruits, chocolate, tomatoes, fatty and spicey foods.  Avoid eating 2-3 hours before bedtime.    3.  Irregular bowel habits and change in bowel habits in the past year. - Scheduling Colonoscopy I discussed risks of colonoscopy with patient to include risk of bleeding, colon perforation, and risk of sedation.  Patient expressed understanding and agrees to proceed with colonoscopy.   4.  Family history of colon cancer grandmother - Scheduling updated colonoscopy  5.  Weight loss: Likely due to taking Ozempic  and eating healthier. -Schedule abdominal pelvic CT to rule out intra-abdominal neoplasm -Hold Ozempic  if weight loss and GI symptoms persist.   Follow up 4 weeks after EGD and colonoscopy.  Ellouise Console, PA-C

## 2024-06-04 ENCOUNTER — Encounter: Payer: Self-pay | Admitting: Physician Assistant

## 2024-06-05 ENCOUNTER — Ambulatory Visit: Payer: Self-pay | Admitting: Physician Assistant

## 2024-06-06 ENCOUNTER — Other Ambulatory Visit: Payer: Self-pay

## 2024-06-06 DIAGNOSIS — Z87891 Personal history of nicotine dependence: Secondary | ICD-10-CM

## 2024-06-06 DIAGNOSIS — F17210 Nicotine dependence, cigarettes, uncomplicated: Secondary | ICD-10-CM

## 2024-06-06 DIAGNOSIS — Z122 Encounter for screening for malignant neoplasm of respiratory organs: Secondary | ICD-10-CM

## 2024-06-07 DIAGNOSIS — G4733 Obstructive sleep apnea (adult) (pediatric): Secondary | ICD-10-CM | POA: Diagnosis not present

## 2024-06-10 ENCOUNTER — Ambulatory Visit (HOSPITAL_COMMUNITY)

## 2024-06-15 ENCOUNTER — Encounter: Payer: Self-pay | Admitting: Family Medicine

## 2024-06-15 ENCOUNTER — Ambulatory Visit (INDEPENDENT_AMBULATORY_CARE_PROVIDER_SITE_OTHER): Payer: Self-pay | Admitting: Family Medicine

## 2024-06-15 ENCOUNTER — Other Ambulatory Visit: Payer: Self-pay

## 2024-06-15 VITALS — BP 123/73 | HR 72 | Ht 70.0 in | Wt 222.0 lb

## 2024-06-15 DIAGNOSIS — J449 Chronic obstructive pulmonary disease, unspecified: Secondary | ICD-10-CM | POA: Diagnosis not present

## 2024-06-15 DIAGNOSIS — I2510 Atherosclerotic heart disease of native coronary artery without angina pectoris: Secondary | ICD-10-CM

## 2024-06-15 DIAGNOSIS — E782 Mixed hyperlipidemia: Secondary | ICD-10-CM

## 2024-06-15 DIAGNOSIS — E1169 Type 2 diabetes mellitus with other specified complication: Secondary | ICD-10-CM | POA: Diagnosis not present

## 2024-06-15 LAB — BAYER DCA HB A1C WAIVED: HB A1C (BAYER DCA - WAIVED): 6.3 % — ABNORMAL HIGH (ref 4.8–5.6)

## 2024-06-15 MED ORDER — SEMAGLUTIDE (1 MG/DOSE) 4 MG/3ML ~~LOC~~ SOPN: 1.0000 mg | PEN_INJECTOR | SUBCUTANEOUS | 3 refills | Status: DC

## 2024-06-15 MED ORDER — CITALOPRAM HYDROBROMIDE 20 MG PO TABS
20.0000 mg | ORAL_TABLET | Freq: Every day | ORAL | 3 refills | Status: AC
Start: 2024-06-15 — End: ?

## 2024-06-15 NOTE — Addendum Note (Signed)
 Addended by: LANETTE ALETHEA CROME on: 06/15/2024 08:49 AM   Modules accepted: Orders

## 2024-06-15 NOTE — Progress Notes (Signed)
 BP 123/73   Pulse 72   Ht 5' 10 (1.778 m)   Wt 222 lb (100.7 kg)   SpO2 95%   BMI 31.85 kg/m    Subjective:   Patient ID: [name], female    DOB: 1956-03-08, 68 y.o.   MRN: [id number]  HPI: [name] is a 68 y.o. female presenting on 06/15/2024 for Medical Management of Chronic Issues, Diabetes, Abdominal Pain, and Discuss Lung CT Results   Discussed the use of AI scribe software for clinical note transcription with the patient, who gave verbal consent to proceed.  History of Present Illness   [name] is a 68 year old female who presents for follow-up and medication management for gastrointestinal issues and fatigue.  Abdominal pain and fatigue - Persistent, constant stomach pain contributing to ongoing fatigue - Scheduled for CT scan next week and colonoscopy on December 12 for further evaluation  Mood symptoms and psychosocial stressors - Currently taking citalopram  (Celexa ) 10 mg for anxiety and depression - Mood symptoms remain unchanged despite medication - Significant family stressors including brother's legal issues and health decline due to alcoholism - Brother-in-law in poor health with history of poorly managed diabetes - Emotional distress related to family circumstances  Medication access issues - Has not received Jardiance  for two months - Has not received Ozempic  for three months and is out of Ozempic  as of two weeks ago - Concerned about financial burden of these medications          Relevant past medical, surgical, family and social history reviewed and updated as indicated. Interim medical history since our last visit reviewed. Allergies and medications reviewed and updated.  Review of Systems  Constitutional:  Negative for chills and fever.  Eyes:  Negative for visual disturbance.  Respiratory:  Negative for chest tightness and shortness of breath.   Cardiovascular:  Negative for chest pain and leg swelling.  Genitourinary:   Negative for difficulty urinating and dysuria.  Musculoskeletal:  Negative for back pain and gait problem.  Skin:  Negative for rash.  Neurological:  Negative for dizziness, light-headedness and headaches.  Psychiatric/Behavioral:  Negative for agitation and behavioral problems.   All other systems reviewed and are negative.   Per HPI unless specifically indicated above   Allergies as of 06/15/2024       Reactions   Fenofibrate Other (See Comments)   Flu-like    Penicillin G Other (See Comments)   Blisters in mouth   Pravastatin Other (See Comments)   flu like symptoms    Statins         Medication List        Accurate as of June 15, 2024 10:49 AM. If you have any questions, ask your nurse or doctor.          albuterol  (2.5 MG/3ML) 0.083% nebulizer solution Commonly known as: PROVENTIL  Take 3 mLs (2.5 mg total) by nebulization every 6 (six) hours as needed for wheezing or shortness of breath.   albuterol  108 (90 Base) MCG/ACT inhaler Commonly known as: Ventolin  HFA Inhale 2 puffs into the lungs every 6 (six) hours as needed. For shortness of breath   citalopram  20 MG tablet Commonly known as: CELEXA  Take 1 tablet (20 mg total) by mouth daily. What changed:  medication strength how much to take Changed by: Fonda LABOR Catharina Pica   cycloSPORINE  0.05 % ophthalmic emulsion Commonly known as: RESTASIS  Place 1 drop into both eyes 2 (two) times daily.  ezetimibe  10 MG tablet Commonly known as: ZETIA  Take 1 tablet (10 mg total) by mouth daily.   gabapentin  600 MG tablet Commonly known as: NEURONTIN  TAKE ONE (1) TABLET THREE (3) TIMES EACH DAY   Jardiance  25 MG Tabs tablet Generic drug: empagliflozin  TAKE 1 TABLET BY MOUTH EVERY MORNING BEFORE BREAKFAST   K-Y Lubricating Gel Apply 1 g topically daily.   nitroGLYCERIN  0.4 MG SL tablet Commonly known as: Nitrostat  Place 1 tablet (0.4 mg total) under the tongue every 5 (five) minutes x 3 doses as needed  (if no relief after 3 dose proceed to ED or call 911).   ondansetron  4 MG tablet Commonly known as: ZOFRAN  Take 1 tablet (4 mg total) by mouth every 8 (eight) hours as needed for nausea or vomiting.   pantoprazole  40 MG tablet Commonly known as: PROTONIX  TAKE ONE (1) TABLET EACH DAY   rosuvastatin  5 MG tablet Commonly known as: Crestor  Take 1 tablet (5 mg total) by mouth daily. Takes one every other day   Semaglutide  (1 MG/DOSE) 4 MG/3ML Sopn Inject 1 mg as directed once a week.   Symbicort  160-4.5 MCG/ACT inhaler Generic drug: budesonide -formoterol  USE 2 PUFFS TWICE DAILY         Objective:   BP 123/73   Pulse 72   Ht 5' 10 (1.778 m)   Wt 222 lb (100.7 kg)   SpO2 95%   BMI 31.85 kg/m   Wt Readings from Last 3 Encounters:  06/15/24 222 lb (100.7 kg)  06/03/24 223 lb (101.2 kg)  03/11/24 212 lb (96.2 kg)    Physical Exam Vitals and nursing note reviewed.  Constitutional:      Appearance: She is well-developed.  Neurological:     Mental Status: She is alert.    Physical Exam   NECK: Thyroid  normal, no nodules. CHEST: Lungs clear to auscultation bilaterally. CARDIOVASCULAR: Heart regular rate and rhythm, no murmurs.         Assessment & Plan:   Problem List Items Addressed This Visit       Respiratory   COPD  GOLD II/ AB     Endocrine   Type 2 diabetes mellitus with other specified complication (HCC) - Primary   Relevant Medications   Semaglutide , 1 MG/DOSE, 4 MG/3ML SOPN   Other Relevant Orders   Bayer DCA Hb A1c Waived     Other   Mixed hyperlipidemia (Chronic)   Other Visit Diagnoses       Atherosclerosis of native coronary artery of native heart without angina pectoris       Relevant Orders   Ambulatory referral to Cardiology         Gastrointestinal symptoms under evaluation Persistent symptoms causing significant discomfort and fatigue. - Await results of CT scan and colonoscopy. - Request copies of CT scan and colonoscopy  results from GI specialist.  Chronic obstructive pulmonary disease (COPD) COPD contributes to fatigue due to decreased oxygenation and increased work of breathing.  Atherosclerotic heart disease of native coronary artery Plaques around coronary arteries noted. Further cardiac evaluation needed. Fatigue likely related to lung and gastrointestinal issues. - Refer to cardiologist Dr. Malapedi for further evaluation. - Request cardiology appointment within two weeks.  Type 2 diabetes mellitus with other specified complication Issues with medication access for Jardiance  and Ozempic  due to changes in assistance programs. - Coordinate with Mliss or Lavern to address patient assistance for Jardiance  and Ozempic . - Schedule appointment with Mliss or Lavern to discuss patient assistance.  Depression  and anxiety Symptoms may be exacerbated by life stressors. Current citalopram  dose low; increase may improve symptoms and energy. - Increase citalopram  to 20 mg daily. - Send new prescription for citalopram . - Advise to take two 10 mg tablets until new prescription is filled.          Follow up plan: Return in about 3 months (around 09/15/2024), or if symptoms worsen or fail to improve, for Diabetes recheck.  Counseling provided for all of the vaccine components Orders Placed This Encounter  Procedures   Bayer DCA Hb A1c Waived   Ambulatory referral to Cardiology    Fonda Levins, MD [facility] 06/15/2024, 10:49 AM

## 2024-06-16 ENCOUNTER — Ambulatory Visit: Payer: Self-pay | Admitting: Family Medicine

## 2024-06-16 ENCOUNTER — Telehealth: Payer: Self-pay

## 2024-06-16 NOTE — Progress Notes (Signed)
 Care Guide Pharmacy Note  06/16/2024 Name: [name] MRN: [id number] DOB: [birth year]  Referred By: [name], Fonda LABOR, MD Reason for referral: Complex Care Management (Outreach to schedule with Pharm d )   [name] is a 68 y.o. year old female who is a primary care patient of [name], Fonda LABOR, MD.  [name] was referred to the pharmacist for assistance related to: DMII  Successful contact was made with the patient to discuss pharmacy services including being ready for the pharmacist to call at least 5 minutes before the scheduled appointment time and to have medication bottles and any blood pressure readings ready for review. The patient agreed to meet with the pharmacist via telephone visit on (date/time).07/05/2024  Jeoffrey Buffalo , RMA     [place]  [facility], [facility] Guide  Direct Dial: [phone]  Website: [email or web address]

## 2024-06-21 ENCOUNTER — Ambulatory Visit (HOSPITAL_COMMUNITY)
Admission: RE | Admit: 2024-06-21 | Discharge: 2024-06-21 | Disposition: A | Discharge status: Home / Self Care | Source: Ambulatory Visit | Attending: Physician Assistant | Admitting: Physician Assistant

## 2024-06-21 DIAGNOSIS — R1013 Epigastric pain: Secondary | ICD-10-CM | POA: Diagnosis not present

## 2024-06-21 DIAGNOSIS — R1084 Generalized abdominal pain: Secondary | ICD-10-CM | POA: Diagnosis not present

## 2024-06-21 DIAGNOSIS — K7689 Other specified diseases of liver: Secondary | ICD-10-CM | POA: Diagnosis not present

## 2024-06-21 MED ORDER — IOHEXOL 300 MG/ML  SOLN
100.0000 mL | Freq: Once | INTRAMUSCULAR | Status: AC | PRN
  Administered 2024-06-21: 100 mL via INTRAVENOUS

## 2024-06-25 ENCOUNTER — Other Ambulatory Visit: Payer: Self-pay | Admitting: Family Medicine

## 2024-06-25 DIAGNOSIS — J449 Chronic obstructive pulmonary disease, unspecified: Secondary | ICD-10-CM

## 2024-06-29 ENCOUNTER — Ambulatory Visit: Payer: Self-pay | Admitting: *Deleted

## 2024-06-29 DIAGNOSIS — E538 Deficiency of other specified B group vitamins: Secondary | ICD-10-CM

## 2024-06-29 MED ORDER — CYANOCOBALAMIN 1000 MCG/ML IJ SOLN
1000.0000 ug | INTRAMUSCULAR | Status: AC
  Administered 2024-06-29 – 2024-08-30 (×3): 1000 ug via INTRAMUSCULAR

## 2024-06-29 NOTE — Progress Notes (Signed)
 Patient is in office today for a nurse visit for B12 Injection. Injection was given in the  Left deltoid. Patient tolerated injection well.

## 2024-07-01 NOTE — Progress Notes (Signed)
 07/05/2024 Name: [name] MRN: [id number] DOB: 1956/06/30  Chief Complaint  Patient presents with   Diabetes   Medication Access    [name] is a 68 y.o. year old female who presented for a telephone visit. They were referred to the pharmacist by their PCP for assistance in managing medication access.    Subjective: [name] presents for a telephone visit today for medication assistance and management. She states that she has not received a shipment from either of her PAP programs for Jardiance  and Ozempic . She has been picking up Jardiance  from [facility] with a $45 copay for the past couple of months. She has been out of Ozempic  for the last month or so. Ran a test claim for Jardiance , and it was estimated to be $20.80 for a 90-day supply. Discussed potentially transferring pharmacies for a lower copay; patient amenable. She would like to have her prescriptions transferred to [facility]. Provided a 0.5 mg sample to restart dose titration and sent an Rx for Ozempic  1 mg to [facility]; will need a PA.  She reports not being able to test her blood sugar at home because she does not have a glucometer or testing supplies. Will send in a prescription to [facility] for all. Encouraged to call the clinic if she would like education about testing supplies.   The patient is currently on Symbicort  for COPD, which is not guideline-recommended treatment. She states that she was previously taking Spiriva  in conjunction with Symbicort , but over the years the Spiriva  fell off of her medication list. She does endorse some dyspnea of exertion and shortness of breath. Will provide a sample of Breztri . Prescription sent to [facility]. Discontinued Symbicort .   The patient will no longer qualify for patient assistance due to an income of >$85,000 per year. Will attempt to minimize costs with preferred pharmacies.   Care Team: Primary Care Provider: [name], Fonda LABOR, MD ; Next Scheduled Visit:  09/15/2024  Medication Access/Adherence Current Pharmacy:  [facility] - [place], [place] - [address] [place] [address] Phone: [phone] Fax: [phone]  [facility] [address], [place] - [place] [address] [place] [address] Phone: [phone] Fax: [phone]  [id number] - [place], [place] - [address] [place] [address] Phone: [phone] Fax: [phone]  [facility] [address] [place] [address] Phone: [phone] Fax: [phone]   Patient reports affordability concerns with their medications: Yes  Patient reports access/transportation concerns to their pharmacy: No  Patient reports adherence concerns with their medications:  Yes  - affordability concern, hasn't received medication from manufacturers   Diabetes: Current medications: Jardiance  25 mg daily, Ozempic  1 mg weekly (has been out of medication for month, manufacturer stopped sending) Medications tried in the past: metformin  Statin: rosuvastatin  5 mg daily LDL of 93 on 03/11/24 ACEi/ARB: none, opportunity for low-dose ACEi or ARB UACR of 7 on 03/11/24  A1c of 6.3% on 06/15/24, decreased from 6.8% on 03/11/24  Current glucose readings: unable to assess, patient does not have glucometer at home. Sending in Rx to pharmacy.  Patient denies hypoglycemic s/sx including dizziness, shakiness, sweating. Patient denies hyperglycemic symptoms including polyuria, polydipsia, polyphagia, nocturia, neuropathy, blurred vision.  Current meal patterns:  Discussed meal planning options and Plate method for healthy eating Avoid sugary drinks and desserts Incorporate balanced protein, non starchy veggies, 1 serving  of carbohydrate with each meal Increase water  intake Increase physical activity as able  Current medication access support: [employer]   Objective: Lab  Results  Component Value Date   HGBA1C 6.3 (H) 06/15/2024   Lab Results  Component Value Date   CREATININE 0.78 06/03/2024   BUN 10 06/03/2024   NA 142 03/11/2024   K 4.1 03/11/2024   CL 104 03/11/2024   CO2 24 03/11/2024   Lab Results  Component Value Date   CHOL 173 03/11/2024   HDL 61 03/11/2024   LDLCALC 93 03/11/2024   TRIG 105 03/11/2024   CHOLHDL 2.8 03/11/2024   Medications Reviewed Today     Reviewed by Bernette Falling, [facility] (Pharmacist) on 07/05/24 at 1707  Med List Status: <None>   Medication Order Taking? Sig Documenting Provider Last Dose Status Informant  Accu-Chek Softclix Lancets lancets [id number] Yes Use up to four times daily as directed. [name], Fonda LABOR, MD  Active   albuterol  (PROVENTIL ) (2.5 MG/3ML) 0.083% nebulizer solution [id number]  Take 3 mLs (2.5 mg total) by nebulization every 6 (six) hours as needed for wheezing or shortness of breath. [name], Fonda LABOR, MD  Active   albuterol  (VENTOLIN  HFA) 108 [phone] Base) MCG/ACT inhaler [id number]  Inhale 2 puffs into the lungs every 6 (six) hours as needed. For shortness of breath [name], Fonda LABOR, MD  Active   Blood Glucose Monitoring Suppl (ACCU-CHEK GUIDE) w/Device KIT [id number] Yes Use up to four times daily as directed. [name], Fonda LABOR, MD  Active     Discontinued 07/05/24 1626 (Change in therapy)   budesonide -glycopyrrolate -formoterol  (BREZTRI  AEROSPHERE) 160-9-4.8 MCG/ACT AERO inhaler [id number] Yes Inhale 2 puffs into the lungs 2 (two) times daily. [name], Fonda LABOR, MD  Active   citalopram  (CELEXA ) 20 MG tablet [id number]  Take 1 tablet (20 mg total) by mouth daily. [name], Fonda LABOR, MD  Active   cyanocobalamin  (VITAMIN B12) injection 1,000 mcg [id number]   [name], Fonda LABOR, MD  Active   cycloSPORINE  (RESTASIS ) 0.05 % ophthalmic emulsion [id number]  Place 1 drop into both eyes 2 (two) times daily. [provider]  Active Self  ezetimibe  (ZETIA ) 10 MG tablet [id number]  Take 1 tablet (10  mg total) by mouth daily. [name], Fonda LABOR, MD  Active   gabapentin  (NEURONTIN ) 600 MG tablet [id number]  TAKE ONE (1) TABLET THREE (3) TIMES EACH DAY [name], Fonda LABOR, MD  Active   glucose blood (ACCU-CHEK GUIDE TEST) test strip [id number] Yes Use up to four times daily as directed. [name], Fonda LABOR, MD  Active   JARDIANCE  25 MG TABS tablet [id number]  TAKE 1 TABLET BY MOUTH EVERY MORNING BEFORE BREAKFAST [name], Fonda LABOR, MD  Active   Lancet Device MISC [id number] Yes Use up to four times daily as directed. [name], Fonda LABOR, MD  Active   Lubricants (K-Y LUBRICATING) GEL [id number]  Apply 1 g topically daily. [name], Fonda LABOR, MD  Active   nitroGLYCERIN  (NITROSTAT ) 0.4 MG SL tablet [id number]  Place 1 tablet (0.4 mg total) under the tongue every 5 (five) minutes x 3 doses as needed (if no relief after 3 dose proceed to ED or call 911). Mallipeddi, Vishnu P, MD  Active   ondansetron  (ZOFRAN ) 4 MG tablet [id number]  Take 1 tablet (4 mg total) by mouth every 8 (eight) hours as needed for nausea or vomiting. [place], PA-C  Active   pantoprazole  (PROTONIX ) 40 MG tablet [id number]  TAKE ONE (1) TABLET EACH DAY [name], Fonda LABOR, MD  Active   rosuvastatin  (CRESTOR ) 5 MG tablet [id number]  Take 1 tablet (5 mg total) by mouth daily. Takes one every other day [name], Fonda LABOR, MD  Active     Discontinued 07/05/24 1615 (Dose change)   Semaglutide , 1 MG/DOSE, 4 MG/3ML SOPN [id number] Yes Inject 1 mg as directed once a week [name], Fonda LABOR, MD  Active             Assessment/Plan:  Diabetes: Currently controlled. Goal of <7%. Cardiorenal risk reduction is opportunities for improvement. Potential to add a low-dose ACEi or ARB in the future. Made multiple changes to medications during today's visit, will defer to follow-up.  Blood pressure is at goal of <130/80 mmHg LDL is not at goal of <70 mg/dL considering high plaque burden coronary arteries  Reviewed long term cardiovascular and  renal outcomes of uncontrolled blood sugar Reviewed goal A1c, goal fasting, and goal 2 hour post prandial glucose Recommend to:  RESTART Ozempic  0.5 mg weekly, then increase to 1 mg after finishing 0.5 mg dose Sample provided at front desk, 1 mg Rx sent to [facility] Jardiance  25 mg daily Recommend to check glucose daily; she does not have glucometer at home, Rx for glucometer and supplies sent to [facility] Medication copays are unaffordable at [facility], lower copays at [facility] (test billing)   COPD: Currently uncontrolled. Patient reports dyspnea on exertion and increased SOB. Will transition to triple therapy, on previously with good control per patient report.  Reviewed appropriate inhaler technique. Recommend to: STOP Symbicort  START Breztri  Sample left at front desk Rx sent to [facility]   Follow Up Plan:  PCP: 09/15/24  Woodie Jock, PharmD PGY1 Pharmacy Resident  07/05/2024   Mliss Tarry Griffin, PharmD, BCACP, CPP Clinical Pharmacist, [facility]

## 2024-07-04 ENCOUNTER — Telehealth: Payer: Self-pay

## 2024-07-04 NOTE — Telephone Encounter (Signed)
 Patient currently enrolled with [employer] patient assistance company for Jardiance  medication until 08/10/24.  Novo Nordisk assistance Ozempic  ends 08/10/24 and will be discontinued for Medicare patients in 2026 (for Ozempic ).

## 2024-07-05 ENCOUNTER — Telehealth: Payer: Self-pay | Admitting: Family Medicine

## 2024-07-05 ENCOUNTER — Other Ambulatory Visit (HOSPITAL_BASED_OUTPATIENT_CLINIC_OR_DEPARTMENT_OTHER): Payer: Self-pay

## 2024-07-05 ENCOUNTER — Other Ambulatory Visit (HOSPITAL_COMMUNITY): Payer: Self-pay

## 2024-07-05 ENCOUNTER — Other Ambulatory Visit (INDEPENDENT_AMBULATORY_CARE_PROVIDER_SITE_OTHER)

## 2024-07-05 DIAGNOSIS — E1169 Type 2 diabetes mellitus with other specified complication: Secondary | ICD-10-CM

## 2024-07-05 DIAGNOSIS — J449 Chronic obstructive pulmonary disease, unspecified: Secondary | ICD-10-CM

## 2024-07-05 MED ORDER — BREZTRI AEROSPHERE 160-9-4.8 MCG/ACT IN AERO
2.0000 | INHALATION_SPRAY | Freq: Two times a day (BID) | RESPIRATORY_TRACT | 11 refills | Status: AC
  Filled 2024-07-05: qty 10.7, 30d supply, fill #0
  Filled 2024-09-08: qty 10.7, 30d supply, fill #1

## 2024-07-05 MED ORDER — ACCU-CHEK GUIDE W/DEVICE KIT
1.0000 | PACK | 0 refills | Status: AC
  Filled 2024-07-05: qty 1, 30d supply, fill #0

## 2024-07-05 MED ORDER — SEMAGLUTIDE (1 MG/DOSE) 4 MG/3ML ~~LOC~~ SOPN
1.0000 mg | PEN_INJECTOR | SUBCUTANEOUS | 6 refills | Status: AC
  Filled 2024-07-05: qty 3, 28d supply, fill #0

## 2024-07-05 MED ORDER — LANCET DEVICE MISC
1.0000 | 0 refills | Status: AC
  Filled 2024-07-05: qty 1, fill #0

## 2024-07-05 MED ORDER — ACCU-CHEK GUIDE TEST VI STRP
1.0000 | ORAL_STRIP | 0 refills | Status: AC
  Filled 2024-07-05: qty 100, 25d supply, fill #0

## 2024-07-05 MED ORDER — ACCU-CHEK SOFTCLIX LANCETS MISC
1.0000 | 0 refills | Status: AC
  Filled 2024-07-05: qty 100, 25d supply, fill #0

## 2024-07-05 NOTE — Telephone Encounter (Unsigned)
 Copied from CRM #[id number]. Topic: General - Other >> Jul 05, 2024  3:41 PM Tobias L wrote: Reason for CRM: Patient missed call from pharmacist, patient states they called her after 2:30pm. Patient had to go pick up her great-grandkids. Requesting to be rescheduled, [phone] >> Jul 05, 2024  3:54 PM Mitzi M wrote: Sent message to [place].

## 2024-07-06 ENCOUNTER — Other Ambulatory Visit (HOSPITAL_BASED_OUTPATIENT_CLINIC_OR_DEPARTMENT_OTHER): Payer: Self-pay

## 2024-07-06 ENCOUNTER — Telehealth: Payer: Self-pay | Admitting: Pharmacist

## 2024-07-06 NOTE — Telephone Encounter (Signed)
  Attempted to do PA for Ozempic  States patient is enrolled in PAP but we haven't gotten a shipment I gave her a sample Anything we can do or just wait until January? Can we get her 1 more month for Ozempic  through PAP?  Thanks!

## 2024-07-08 ENCOUNTER — Other Ambulatory Visit (HOSPITAL_BASED_OUTPATIENT_CLINIC_OR_DEPARTMENT_OTHER): Payer: Self-pay

## 2024-07-08 MED ORDER — OZEMPIC (1 MG/DOSE) 4 MG/3ML ~~LOC~~ SOPN
1.0000 mg | PEN_INJECTOR | SUBCUTANEOUS | 3 refills | Status: AC
  Filled 2024-08-18: qty 3, 28d supply, fill #0
  Filled 2024-09-08: qty 3, 28d supply, fill #1

## 2024-07-08 MED FILL — Empagliflozin Tab 25 MG: ORAL | 90 days supply | Qty: 90 | Fill #0 | Status: AC

## 2024-07-09 ENCOUNTER — Other Ambulatory Visit (HOSPITAL_BASED_OUTPATIENT_CLINIC_OR_DEPARTMENT_OTHER): Payer: Self-pay

## 2024-07-11 ENCOUNTER — Other Ambulatory Visit (HOSPITAL_BASED_OUTPATIENT_CLINIC_OR_DEPARTMENT_OTHER): Payer: Self-pay

## 2024-07-12 ENCOUNTER — Other Ambulatory Visit (HOSPITAL_BASED_OUTPATIENT_CLINIC_OR_DEPARTMENT_OTHER): Payer: Self-pay

## 2024-07-13 ENCOUNTER — Other Ambulatory Visit (HOSPITAL_BASED_OUTPATIENT_CLINIC_OR_DEPARTMENT_OTHER): Payer: Self-pay

## 2024-07-15 ENCOUNTER — Encounter: Payer: Self-pay | Admitting: Internal Medicine

## 2024-07-19 NOTE — Telephone Encounter (Signed)
 Income provided and documented with [place].

## 2024-07-21 ENCOUNTER — Telehealth: Payer: Self-pay | Admitting: Pharmacist

## 2024-07-22 ENCOUNTER — Encounter: Payer: Self-pay | Admitting: Internal Medicine

## 2024-07-22 ENCOUNTER — Other Ambulatory Visit (HOSPITAL_COMMUNITY): Payer: Self-pay

## 2024-07-22 ENCOUNTER — Ambulatory Visit: Admitting: Internal Medicine

## 2024-07-22 VITALS — BP 114/69 | HR 68 | Temp 98.0°F | Resp 13 | Ht 70.0 in | Wt 223.0 lb

## 2024-07-22 DIAGNOSIS — R194 Change in bowel habit: Secondary | ICD-10-CM

## 2024-07-22 DIAGNOSIS — K2970 Gastritis, unspecified, without bleeding: Secondary | ICD-10-CM

## 2024-07-22 DIAGNOSIS — D122 Benign neoplasm of ascending colon: Secondary | ICD-10-CM

## 2024-07-22 DIAGNOSIS — D123 Benign neoplasm of transverse colon: Secondary | ICD-10-CM

## 2024-07-22 DIAGNOSIS — D124 Benign neoplasm of descending colon: Secondary | ICD-10-CM

## 2024-07-22 DIAGNOSIS — Z800 Family history of malignant neoplasm of digestive organs: Secondary | ICD-10-CM

## 2024-07-22 DIAGNOSIS — D125 Benign neoplasm of sigmoid colon: Secondary | ICD-10-CM

## 2024-07-22 DIAGNOSIS — R1013 Epigastric pain: Secondary | ICD-10-CM

## 2024-07-22 DIAGNOSIS — K229 Disease of esophagus, unspecified: Secondary | ICD-10-CM

## 2024-07-22 DIAGNOSIS — K319 Disease of stomach and duodenum, unspecified: Secondary | ICD-10-CM | POA: Diagnosis not present

## 2024-07-22 MED ORDER — DIFLUCAN 100 MG PO TABS: 100.0000 mg | ORAL_TABLET | Freq: Every day | ORAL | 0 refills | Status: DC

## 2024-07-22 MED ORDER — SODIUM CHLORIDE 0.9 % IV SOLN: 500.0000 mL | Freq: Once | INTRAVENOUS | Status: DC

## 2024-07-22 NOTE — Progress Notes (Signed)
 GASTROENTEROLOGY PROCEDURE H&P NOTE   Primary Care Physician: [name], Fonda LABOR, MD    Reason for Procedure:  GERD, epigastric pain, change in bowel habits  Plan:    Upper and lower endoscopy  Patient is appropriate for endoscopic procedure(s) in the ambulatory ([facility]) setting.  The nature of the procedure, as well as the risks, benefits, and alternatives were carefully and thoroughly reviewed with the patient. Ample time for discussion and questions allowed.  All questions were answered. The patient understood, was satisfied, and agreed with the plan to proceed.    HPI: [name] is a 68 y.o. female who presents for EGD and colonoscopy.  Medical history as below.  Tolerated the prep.  No recent chest pain or shortness of breath.  No abdominal pain today.  Past Medical History:  Diagnosis Date   Anxiety    Arthritis    Complication of anesthesia    COPD (chronic obstructive pulmonary disease) (HCC)    Depression    Diabetes mellitus without complication (HCC)    Gallstones    GERD (gastroesophageal reflux disease)    History of skin cancer    Migraine    Mixed hyperlipidemia    Multiple sclerosis    Left-sided weakness   Neuropathy    Obesity    PONV (postoperative nausea and vomiting)    Psoriasis    Seasonal allergies    Sleep apnea     Past Surgical History:  Procedure Laterality Date   ABDOMINAL HYSTERECTOMY  1984   CHOLECYSTECTOMY  1999   COLONOSCOPY N/A 08/02/2015   Procedure: COLONOSCOPY;  Surgeon: Claudis RAYMOND Rivet, MD;  [facility];  Service: Endoscopy;  Laterality: N/A;  12:00-moved to 1220 Ann to notify pt   KNEE ARTHROSCOPY  2003   RT   LAPAROSCOPIC APPENDECTOMY N/A 02/12/2017   Procedure: APPENDECTOMY LAPAROSCOPIC;  Surgeon: Mavis Anes, MD;  Location: [facility];  Service: General;  Laterality: N/A;   Left breast lumpectomy  1985   Left carpal tunnel release Left 1990   PARTIAL KNEE ARTHROPLASTY Left 01/22/2015   Procedure: LEFT  KNEE MEDIAL UNICOMPARTMENTAL KNEE;  Surgeon: Dempsey Moan, MD;  Location: [facility];  Service: Orthopedics;  Laterality: Left;   Right total knee replacement  2010   SHOULDER SURGERY Left 2008   Sinus cyst removal     age 51   TOTAL HIP ARTHROPLASTY Right 05/14/2022   Procedure: TOTAL HIP ARTHROPLASTY ANTERIOR APPROACH;  Surgeon: Moan Dempsey, MD;  Location: [facility];  Service: Orthopedics;  Laterality: Right;   TOTAL KNEE REVISION  05/05/2012   Procedure: TOTAL KNEE REVISION;  Surgeon: Dempsey LULLA Moan, MD;  Location: [facility];  Service: Orthopedics;  Laterality: Right;   WRIST SURGERY Right 2023   rt    Prior to Admission medications  Medication Sig Start Date End Date Taking? Authorizing Provider  Accu-Chek Softclix Lancets lancets Use up to four times daily as directed. 07/05/24  Yes [name], Fonda LABOR, MD  albuterol  (VENTOLIN  HFA) 108 (90 Base) MCG/ACT inhaler Inhale 2 puffs into the lungs every 6 (six) hours as needed. For shortness of breath 12/10/23  Yes [name], Fonda LABOR, MD  Blood Glucose Monitoring Suppl (ACCU-CHEK GUIDE) w/Device KIT Use up to four times daily as directed. 07/05/24  Yes [name], Fonda LABOR, MD  budesonide -glycopyrrolate -formoterol  (BREZTRI  AEROSPHERE) 160-9-4.8 MCG/ACT AERO inhaler Inhale 2 puffs into the lungs 2 (two) times daily. 07/05/24  Yes [name], Fonda LABOR, MD  citalopram  (CELEXA ) 20 MG tablet Take 1 tablet (  20 mg total) by mouth daily. 06/15/24  Yes [name], Fonda LABOR, MD  cycloSPORINE  (RESTASIS ) 0.05 % ophthalmic emulsion Place 1 drop into both eyes 2 (two) times daily.   Yes [provider]  empagliflozin  (JARDIANCE ) 25 MG TABS tablet TAKE 1 TABLET BY MOUTH EVERY MORNING BEFORE BREAKFAST 08/17/23  Yes [name], Fonda LABOR, MD  ezetimibe  (ZETIA ) 10 MG tablet Take 1 tablet (10 mg total) by mouth daily. 12/10/23  Yes [name], Fonda LABOR, MD  gabapentin  (NEURONTIN ) 600 MG tablet TAKE ONE (1) TABLET THREE (3) TIMES EACH DAY 12/10/23  Yes [name], Fonda LABOR, MD   glucose blood (ACCU-CHEK GUIDE TEST) test strip Use up to four times daily as directed. 07/05/24  Yes [name], Fonda LABOR, MD  Lancet Device MISC Use up to four times daily as directed. 07/05/24  Yes [name], Fonda LABOR, MD  ondansetron  (ZOFRAN ) 4 MG tablet Take 1 tablet (4 mg total) by mouth every 8 (eight) hours as needed for nausea or vomiting. 06/03/24  Yes [place], PA-C  pantoprazole  (PROTONIX ) 40 MG tablet TAKE ONE (1) TABLET EACH DAY 12/10/23  Yes [name], Fonda LABOR, MD  rosuvastatin  (CRESTOR ) 5 MG tablet Take 1 tablet (5 mg total) by mouth daily. Takes one every other day 12/10/23  Yes [name], Fonda LABOR, MD  albuterol  (PROVENTIL ) (2.5 MG/3ML) 0.083% nebulizer solution Take 3 mLs (2.5 mg total) by nebulization every 6 (six) hours as needed for wheezing or shortness of breath. 12/10/23   [name], Fonda LABOR, MD  Lubricants (K-Y LUBRICATING) GEL Apply 1 g topically daily. 05/21/23   [name], Fonda LABOR, MD  nitroGLYCERIN  (NITROSTAT ) 0.4 MG SL tablet Place 1 tablet (0.4 mg total) under the tongue every 5 (five) minutes x 3 doses as needed (if no relief after 3 dose proceed to ED or call 911). 08/26/23   Mallipeddi, Vishnu P, MD  Semaglutide , 1 MG/DOSE, (OZEMPIC , 1 MG/DOSE,) 4 MG/3ML SOPN Inject 1 mg into the skin once a week. 06/15/24   [name], Fonda LABOR, MD  Semaglutide , 1 MG/DOSE, 4 MG/3ML SOPN Inject 1 mg as directed once a week 07/05/24   [name], Fonda LABOR, MD    Current Outpatient Medications  Medication Sig Dispense Refill   Accu-Chek Softclix Lancets lancets Use up to four times daily as directed. 100 each 0   albuterol  (VENTOLIN  HFA) 108 (90 Base) MCG/ACT inhaler Inhale 2 puffs into the lungs every 6 (six) hours as needed. For shortness of breath 1 each 1   Blood Glucose Monitoring Suppl (ACCU-CHEK GUIDE) w/Device KIT Use up to four times daily as directed. 1 kit 0   budesonide -glycopyrrolate -formoterol  (BREZTRI  AEROSPHERE) 160-9-4.8 MCG/ACT AERO inhaler Inhale 2 puffs into the  lungs 2 (two) times daily. 10.7 g 11   citalopram  (CELEXA ) 20 MG tablet Take 1 tablet (20 mg total) by mouth daily. 90 tablet 3   cycloSPORINE  (RESTASIS ) 0.05 % ophthalmic emulsion Place 1 drop into both eyes 2 (two) times daily.     empagliflozin  (JARDIANCE ) 25 MG TABS tablet TAKE 1 TABLET BY MOUTH EVERY MORNING BEFORE BREAKFAST 90 tablet 3   ezetimibe  (ZETIA ) 10 MG tablet Take 1 tablet (10 mg total) by mouth daily. 90 tablet 3   gabapentin  (NEURONTIN ) 600 MG tablet TAKE ONE (1) TABLET THREE (3) TIMES EACH DAY 270 tablet 3   glucose blood (ACCU-CHEK GUIDE TEST) test strip Use up to four times daily as directed. 100 each 0   Lancet Device MISC Use up to four times daily as directed. 1 each 0  ondansetron  (ZOFRAN ) 4 MG tablet Take 1 tablet (4 mg total) by mouth every 8 (eight) hours as needed for nausea or vomiting. 10 tablet 0   pantoprazole  (PROTONIX ) 40 MG tablet TAKE ONE (1) TABLET EACH DAY 90 tablet 3   rosuvastatin  (CRESTOR ) 5 MG tablet Take 1 tablet (5 mg total) by mouth daily. Takes one every other day 45 tablet 3   albuterol  (PROVENTIL ) (2.5 MG/3ML) 0.083% nebulizer solution Take 3 mLs (2.5 mg total) by nebulization every 6 (six) hours as needed for wheezing or shortness of breath. 150 mL 1   Lubricants (K-Y LUBRICATING) GEL Apply 1 g topically daily. 120 g 5   nitroGLYCERIN  (NITROSTAT ) 0.4 MG SL tablet Place 1 tablet (0.4 mg total) under the tongue every 5 (five) minutes x 3 doses as needed (if no relief after 3 dose proceed to ED or call 911). 25 tablet 2   Semaglutide , 1 MG/DOSE, (OZEMPIC , 1 MG/DOSE,) 4 MG/3ML SOPN Inject 1 mg into the skin once a week. 9 mL 3   Semaglutide , 1 MG/DOSE, 4 MG/3ML SOPN Inject 1 mg as directed once a week 3 mL 6   Current Facility-Administered Medications  Medication Dose Route Frequency Provider Last Rate Last Admin   0.9 %  sodium chloride  infusion  500 mL Intravenous Once Zuriel Roskos, Gordy HERO, MD       cyanocobalamin  (VITAMIN B12) injection 1,000 mcg  1,000  mcg Intramuscular Q30 days [name], Fonda LABOR, MD   1,000 mcg at 06/29/24 1431    Allergies as of 07/22/2024 - Review Complete 07/22/2024  Allergen Reaction Noted   Fenofibrate Other (See Comments) 01/17/2013   Penicillin g Other (See Comments) 04/01/2017   Pravastatin Other (See Comments) 01/17/2013   Statins Other (See Comments) 07/23/2021    Family History  Problem Relation Age of Onset   Dementia Mother    Thyroid  disease Mother        goiter    Diabetes Mother    Atrial fibrillation Father        Died with pulmonary embolus   Emphysema Father    Heart disease Father    Other Father        blood clots    Celiac disease Father    GI problems Father        dumping syndrome - malabsorbtion    Ulcers Father        bleeding stomach ulcers   Other Sister        hereditary = blood clots    Parkinson's disease Brother    Alcohol  abuse Brother    Hypertension Brother    Cancer - Colon Maternal Grandmother    Dementia Maternal Grandmother    Heart disease Maternal Grandfather    Stroke Maternal Grandfather    Heart attack Paternal Grandmother    Other Paternal Grandfather        appendix rupture   Arthritis Son    Hypertension Son    Celiac disease Son    Esophageal cancer Maternal Uncle        spread to lungs and liver and he died in 6 months    Social History   Socioeconomic History   Marital status: Married    Spouse name: Daril    Number of children: 2   Years of education: Not on file   Highest education level: Some college, no degree  Occupational History   Occupation: diabled    Comment: teller - engineer, maintenance  Tobacco Use   Smoking  status: Every Day    Current packs/day: 0.25    Average packs/day: 0.3 packs/day for 52.1 years (13.0 ttl pk-yrs)    Types: Cigarettes    Start date: 06/16/1972   Smokeless tobacco: Never   Tobacco comments:    3/4 pack a day sicne age 21  Vaping Use   Vaping status: Never Used  Substance and Sexual Activity   Alcohol   use: No    Alcohol /week: 0.0 standard drinks of alcohol    Drug use: No   Sexual activity: Not on file  Other Topics Concern   Not on file  Social History Narrative   Disabled, married, lives with husband Lee [place]. 2 children  - 9 grandchildren, 2 dogs   Children live within 10 miles   Social Drivers of Health   Tobacco Use: High Risk (06/15/2024)   Patient History    Smoking Tobacco Use: Every Day    Smokeless Tobacco Use: Never    Passive Exposure: Not on file  Financial Resource Strain: Low Risk (03/10/2024)   Overall Financial Resource Strain (CARDIA)    Difficulty of Paying Living Expenses: Not very hard  Food Insecurity: No Food Insecurity (06/14/2024)   Epic    Worried About Programme Researcher, Broadcasting/film/video in the Last Year: Never true    Ran Out of Food in the Last Year: Never true  Transportation Needs: Unknown (06/14/2024)   Epic    Lack of Transportation (Medical): No    Lack of Transportation (Non-Medical): Not on file  Physical Activity: Insufficiently Active (06/14/2024)   Exercise Vital Sign    Days of Exercise per Week: 3 days    Minutes of Exercise per Session: 20 min  Stress: Stress Concern Present (06/14/2024)   [employer] of Occupational Health - Occupational Stress Questionnaire    Feeling of Stress: Rather much  Social Connections: Unknown (06/14/2024)   Social Connection and Isolation Panel    Frequency of Communication with Friends and Family: Not on file    Frequency of Social Gatherings with Friends and Family: Twice a week    Attends Religious Services: Not on file    Active Member of Clubs or Organizations: Yes    Attends Banker Meetings: More than 4 times per year    Marital Status: Married  Catering Manager Violence: Not At Risk (07/31/2023)   Humiliation, Afraid, Rape, and Kick questionnaire    Fear of Current or Ex-Partner: No    Emotionally Abused: No    Physically Abused: No    Sexually Abused: No  Depression (PHQ2-9): High Risk  (06/15/2024)   Depression (PHQ2-9)    PHQ-2 Score: 13  Alcohol  Screen: Low Risk (07/31/2023)   Alcohol  Screen    Last Alcohol  Screening Score (AUDIT): 0  Housing: Low Risk (06/14/2024)   Epic    Unable to Pay for Housing in the Last Year: No    Number of Times Moved in the Last Year: 0    Homeless in the Last Year: No  Utilities: Not At Risk (07/31/2023)   AHC Utilities    Threatened with loss of utilities: No  Health Literacy: Adequate Health Literacy (07/31/2023)   B1300 Health Literacy    Frequency of need for help with medical instructions: Never    Physical Exam: Vital signs in last 24 hours: @BP  (!) 161/84   Pulse 79   Temp 98 F (36.7 C)   Ht 5' 10 (1.778 m)   Wt 223 lb (101.2 kg)   SpO2 92%  BMI 32.00 kg/m  GEN: NAD EYE: Sclerae anicteric ENT: MMM CV: Non-tachycardic Pulm: CTA b/l GI: Soft, NT/ND NEURO:  Alert & Oriented x 3   Gordy Starch, MD [facility]  07/22/2024 8:29 AM

## 2024-07-22 NOTE — Op Note (Signed)
 [facility] Patient Name: [name] Procedure Date: 07/22/2024 8:34 AM MRN: [id number] Endoscopist: Gordy CHRISTELLA Starch , MD, [id number] Age: 68 Referring MD:  Date of Birth: Jun 05, 1956 Gender: Female Account #: [id number] Procedure:                Upper GI endoscopy Indications:              Epigastric abdominal pain (intermittent),                            Gastro-esophageal reflux disease, currently on                            pantoprazole  40 mg daily, H Pylori stool neg, CT                            abd/pelvis unremarkable from GI standpt Medicines:                Monitored Anesthesia Care Procedure:                Pre-Anesthesia Assessment:                           - Prior to the procedure, a History and Physical                            was performed, and patient medications and                            allergies were reviewed. The patient's tolerance of                            previous anesthesia was also reviewed. The risks                            and benefits of the procedure and the sedation                            options and risks were discussed with the patient.                            All questions were answered, and informed consent                            was obtained. Prior Anticoagulants: The patient has                            taken no anticoagulant or antiplatelet agents. ASA                            Grade Assessment: III - A patient with severe                            systemic disease. After reviewing the risks and  benefits, the patient was deemed in satisfactory                            condition to undergo the procedure.                           After obtaining informed consent, the endoscope was                            passed under direct vision. Throughout the                            procedure, the patient's blood pressure, pulse, and                            oxygen saturations were  monitored continuously. The                            GIF HQ190 #[id number] was introduced through the                            mouth, and advanced to the second part of duodenum.                            The upper GI endoscopy was accomplished without                            difficulty. The patient tolerated the procedure                            well. Scope In: Scope Out: Findings:                 Patchy, yellow plaques were found in the entire                            esophagus.                           The Z-line was irregular and was found 38 cm from                            the incisors. This was biopsied with a cold forceps                            for evaluation to rule out Barrett's Esophagus.                           A 2 cm hiatal hernia was present.                           The gastroesophageal flap valve was visualized                            endoscopically and classified as Hill Grade III                            (  minimal fold, loose to endoscope, hiatal hernia                            likely).                           Diffuse moderate inflammation characterized by                            congestion (edema), erythema and granularity was                            found in the entire examined stomach. Biopsies were                            taken with a cold forceps for Helicobacter pylori                            testing.                           The examined duodenum was normal. Complications:            No immediate complications. Estimated Blood Loss:     Estimated blood loss was minimal. Impression:               - Esophageal plaques were found, consistent with                            candidiasis.                           - Z-line irregular, 38 cm from the incisors.                            Biopsied.                           - 2 cm hiatal hernia.                           - Gastritis. Biopsied.                           - Normal  examined duodenum. Recommendation:           - Patient has a contact number available for                            emergencies. The signs and symptoms of potential                            delayed complications were discussed with the                            patient. Return to normal activities tomorrow.  Written discharge instructions were provided to the                            patient.                           - Resume previous diet.                           - Continue present medications.                           - Fluconazole 400 mg x 1 day, 200 mg x 13 days for                            Candida esophagitis.                           - Await pathology results. Gordy CHRISTELLA Starch, MD 07/22/2024 9:18:38 AM This report has been signed electronically.

## 2024-07-22 NOTE — Op Note (Signed)
 [facility] Patient Name: [name] Procedure Date: 07/22/2024 8:33 AM MRN: [id number] Endoscopist: Gordy CHRISTELLA Starch , MD, [id number] Age: 68 Referring MD:  Date of Birth: [birth year] Gender: Female Account #: [id number] Procedure:                Colonoscopy Indications:              Last colonoscopy: 2016 (Dr. Golda), Change in                            bowel habits Medicines:                Monitored Anesthesia Care Procedure:                Pre-Anesthesia Assessment:                           - Prior to the procedure, a History and Physical                            was performed, and patient medications and                            allergies were reviewed. The patient's tolerance of                            previous anesthesia was also reviewed. The risks                            and benefits of the procedure and the sedation                            options and risks were discussed with the patient.                            All questions were answered, and informed consent                            was obtained. Prior Anticoagulants: The patient has                            taken no anticoagulant or antiplatelet agents. ASA                            Grade Assessment: III - A patient with severe                            systemic disease. After reviewing the risks and                            benefits, the patient was deemed in satisfactory                            condition to undergo the procedure.  After obtaining informed consent, the colonoscope                            was passed under direct vision. Throughout the                            procedure, the patient's blood pressure, pulse, and                            oxygen saturations were monitored continuously. The                            CF HQ190L #[id number] was introduced through the anus                            and advanced to the cecum, identified by                             appendiceal orifice and ileocecal valve. The                            colonoscopy was performed without difficulty. The                            patient tolerated the procedure well. The quality                            of the bowel preparation was good. The ileocecal                            valve, appendiceal orifice, and rectum were                            photographed. Scope In: 8:54:32 AM Scope Out: 9:12:22 AM Scope Withdrawal Time: 0 hours 15 minutes 2 seconds  Total Procedure Duration: 0 hours 17 minutes 50 seconds  Findings:                 The digital rectal exam was normal.                           Two sessile polyps were found in the ascending                            colon. The polyps were 5 to 6 mm in size. These                            polyps were removed with a cold snare. Resection                            and retrieval were complete.                           Two sessile polyps were found in the transverse  colon. The polyps were 3 to 6 mm in size. These                            polyps were removed with a cold snare. Resection                            and retrieval were complete.                           A 7 mm polyp was found in the descending colon. The                            polyp was sessile. The polyp was removed with a                            cold snare. Resection and retrieval were complete.                           Two sessile polyps were found in the sigmoid colon.                            The polyps were 6 to 7 mm in size. These polyps                            were removed with a cold snare. Resection and                            retrieval were complete.                           The retroflexed view of the distal rectum and anal                            verge was normal and showed no anal or rectal                            abnormalities. Complications:            No  immediate complications. Estimated Blood Loss:     Estimated blood loss was minimal. Impression:               - Two 5 to 6 mm polyps in the ascending colon,                            removed with a cold snare. Resected and retrieved.                           - Two 3 to 6 mm polyps in the transverse colon,                            removed with a cold snare. Resected and retrieved.                           -  One 7 mm polyp in the descending colon, removed                            with a cold snare. Resected and retrieved.                           - Two 6 to 7 mm polyps in the sigmoid colon,                            removed with a cold snare. Resected and retrieved.                           - The distal rectum and anal verge are normal on                            retroflexion view. Recommendation:           - Patient has a contact number available for                            emergencies. The signs and symptoms of potential                            delayed complications were discussed with the                            patient. Return to normal activities tomorrow.                            Written discharge instructions were provided to the                            patient.                           - Resume previous diet.                           - Continue present medications.                           - Await pathology results.                           - Repeat colonoscopy is recommended for                            surveillance. The colonoscopy date will be                            determined after pathology results from today's                            exam become available for review. Gordy CHRISTELLA Starch, MD 07/22/2024 9:21:28 AM This report has been signed electronically.

## 2024-07-22 NOTE — Progress Notes (Signed)
 Sedate, gd SR, tolerated procedure well, VSS, report to RN

## 2024-07-22 NOTE — Progress Notes (Signed)
 Called to room to assist during endoscopic procedure.  Patient ID and intended procedure confirmed with present staff. Received instructions for my participation in the procedure from the performing physician.

## 2024-07-22 NOTE — Patient Instructions (Signed)
 Handouts provided about hiatal hernia, gastritis and polyps.   Resume previous diet.  Continue present medications.  Fluconazole 400 mg x 1 day; 200 mg x 13 days for Candida esophagitis.  Await pathology results.  Repeat colonoscopy is recommended for surveillance.  The colonoscopy date will be determined after pathology results from today's exam become available for review.    YOU HAD AN ENDOSCOPIC PROCEDURE TODAY AT THE [facility]:   Refer to the procedure report that was given to you for any specific questions about what was found during the examination.  If the procedure report does not answer your questions, please call your gastroenterologist to clarify.  If you requested that your care partner not be given the details of your procedure findings, then the procedure report has been included in a sealed envelope for you to review at your convenience later.  YOU SHOULD EXPECT: Some feelings of bloating in the abdomen. Passage of more gas than usual.  Walking can help get rid of the air that was put into your GI tract during the procedure and reduce the bloating. If you had a lower endoscopy (such as a colonoscopy or flexible sigmoidoscopy) you may notice spotting of blood in your stool or on the toilet paper. If you underwent a bowel prep for your procedure, you may not have a normal bowel movement for a few days.  Please Note:  You might notice some irritation and congestion in your nose or some drainage.  This is from the oxygen used during your procedure.  There is no need for concern and it should clear up in a day or so.  SYMPTOMS TO REPORT IMMEDIATELY:  Following lower endoscopy (colonoscopy or flexible sigmoidoscopy):  Excessive amounts of blood in the stool  Significant tenderness or worsening of abdominal pains  Swelling of the abdomen that is new, acute  Fever of 100F or higher  Following upper endoscopy (EGD)  Vomiting of blood or coffee ground material  New  chest pain or pain under the shoulder blades  Painful or persistently difficult swallowing  New shortness of breath  Fever of 100F or higher  Black, tarry-looking stools  For urgent or emergent issues, a gastroenterologist can be reached at any hour by calling [phone]. Do not use MyChart messaging for urgent concerns.    DIET:  We do recommend a small meal at first, but then you may proceed to your regular diet.  Drink plenty of fluids but you should avoid alcoholic beverages for 24 hours.  ACTIVITY:  You should plan to take it easy for the rest of today and you should NOT DRIVE or use heavy machinery until tomorrow (because of the sedation medicines used during the test).    FOLLOW UP: Our staff will call the number listed on your records the next business day following your procedure.  We will call around 7:15- 8:00 am to check on you and address any questions or concerns that you may have regarding the information given to you following your procedure. If we do not reach you, we will leave a message.     If any biopsies were taken you will be contacted by phone or by letter within the next 1-3 weeks.  Please call us  at [phone] if you have not heard about the biopsies in 3 weeks.    SIGNATURES/CONFIDENTIALITY: You and/or your care partner have signed paperwork which will be entered into your electronic medical record.  These signatures attest to the fact  that that the information above on your After Visit Summary has been reviewed and is understood.  Full responsibility of the confidentiality of this discharge information lies with you and/or your care-partner.

## 2024-07-22 NOTE — Progress Notes (Signed)
 Vs by MO  Pt's states no medical or surgical changes since previsit or office visit.

## 2024-07-25 ENCOUNTER — Telehealth: Payer: Self-pay | Admitting: *Deleted

## 2024-07-25 NOTE — Telephone Encounter (Signed)
 No answer on f/u call

## 2024-07-26 LAB — SURGICAL PATHOLOGY

## 2024-07-27 ENCOUNTER — Ambulatory Visit: Payer: Self-pay | Admitting: Internal Medicine

## 2024-07-29 ENCOUNTER — Ambulatory Visit

## 2024-07-29 DIAGNOSIS — E538 Deficiency of other specified B group vitamins: Secondary | ICD-10-CM

## 2024-07-29 NOTE — Progress Notes (Signed)
 Patient is in office today for a nurse visit for B12 Injection.  Injection was given in the  Right deltoid. Patient tolerated injection well.

## 2024-08-17 ENCOUNTER — Ambulatory Visit: Attending: Internal Medicine | Admitting: Internal Medicine

## 2024-08-17 ENCOUNTER — Encounter: Payer: Self-pay | Admitting: Internal Medicine

## 2024-08-17 VITALS — BP 128/78 | HR 79 | Ht 70.0 in | Wt 228.0 lb

## 2024-08-17 DIAGNOSIS — G4733 Obstructive sleep apnea (adult) (pediatric): Secondary | ICD-10-CM | POA: Insufficient documentation

## 2024-08-17 DIAGNOSIS — R079 Chest pain, unspecified: Secondary | ICD-10-CM | POA: Diagnosis not present

## 2024-08-17 DIAGNOSIS — Z136 Encounter for screening for cardiovascular disorders: Secondary | ICD-10-CM

## 2024-08-17 DIAGNOSIS — R0602 Shortness of breath: Secondary | ICD-10-CM | POA: Diagnosis not present

## 2024-08-17 MED ORDER — METOPROLOL TARTRATE 25 MG PO TABS: 25.0000 mg | ORAL_TABLET | Freq: Two times a day (BID) | ORAL | 3 refills | Status: AC

## 2024-08-17 NOTE — Progress Notes (Signed)
 "    Cardiology Office Note  Date: 08/17/2024   ID: Michelle, Rodriguez May 20, 1956, MRN [id number]  PCP:  [name], Fonda LABOR, MD  Cardiologist:  Michelle SHAUNNA Maywood, MD Electrophysiologist:  None   History of Present Illness: [name] is a 69 y.o. female known to have multiple sclerosis, COPD, GERD, DM 2, nicotine abuse is here for follow-up visit of coronary calcifications.  Longstanding chest pains and SOB for the last 1 year.  Worsening in the last 4 months.  Exertional chest pain lasting between 5 and 15 minutes is currently occurring once a week.  She is not active at baseline.  DOE worsened in the last 4 months.  She was previously on Ozempic  per patient assistance program however due to recent insurance changes nationally, Ozempic  had to be discontinued 4 months ago after which she gained 18 pounds.  Does not have other symptoms of dizziness, syncope, palpitations.  No orthopnea, PND.  Sleeps with CPAP.  Past Medical History:  Diagnosis Date   Anxiety    Arthritis    Complication of anesthesia    COPD (chronic obstructive pulmonary disease) (HCC)    Depression    Diabetes mellitus without complication (HCC)    Gallstones    GERD (gastroesophageal reflux disease)    History of skin cancer    Migraine    Mixed hyperlipidemia    Multiple sclerosis    Left-sided weakness   Neuropathy    Obesity    PONV (postoperative nausea and vomiting)    Psoriasis    Seasonal allergies    Sleep apnea     Past Surgical History:  Procedure Laterality Date   ABDOMINAL HYSTERECTOMY  1984   CHOLECYSTECTOMY  1999   COLONOSCOPY N/A 08/02/2015   Procedure: COLONOSCOPY;  Surgeon: Claudis RAYMOND Rivet, MD;  [facility];  Service: Endoscopy;  Laterality: N/A;  12:00-moved to 1220 Ann to notify pt   KNEE ARTHROSCOPY  2003   RT   LAPAROSCOPIC APPENDECTOMY N/A 02/12/2017   Procedure: APPENDECTOMY LAPAROSCOPIC;  Surgeon: Mavis Anes, MD;  Location: [facility];  Service: General;   Laterality: N/A;   Left breast lumpectomy  1985   Left carpal tunnel release Left 1990   PARTIAL KNEE ARTHROPLASTY Left 01/22/2015   Procedure: LEFT KNEE MEDIAL UNICOMPARTMENTAL KNEE;  Surgeon: Dempsey Moan, MD;  Location: [facility];  Service: Orthopedics;  Laterality: Left;   Right total knee replacement  2010   SHOULDER SURGERY Left 2008   Sinus cyst removal     age 4   TOTAL HIP ARTHROPLASTY Right 05/14/2022   Procedure: TOTAL HIP ARTHROPLASTY ANTERIOR APPROACH;  Surgeon: Moan Dempsey, MD;  Location: [facility];  Service: Orthopedics;  Laterality: Right;   TOTAL KNEE REVISION  05/05/2012   Procedure: TOTAL KNEE REVISION;  Surgeon: Dempsey LULLA Moan, MD;  Location: [facility];  Service: Orthopedics;  Laterality: Right;   WRIST SURGERY Right 2023   rt    Current Outpatient Medications  Medication Sig Dispense Refill   Accu-Chek Softclix Lancets lancets Use up to four times daily as directed. 100 each 0   albuterol  (PROVENTIL ) (2.5 MG/3ML) 0.083% nebulizer solution Take 3 mLs (2.5 mg total) by nebulization every 6 (six) hours as needed for wheezing or shortness of breath. 150 mL 1   albuterol  (VENTOLIN  HFA) 108 (90 Base) MCG/ACT inhaler Inhale 2 puffs into the lungs every 6 (six) hours as needed. For shortness of breath 1 each 1   Blood Glucose Monitoring Suppl (  ACCU-CHEK GUIDE) w/Device KIT Use up to four times daily as directed. 1 kit 0   budesonide -glycopyrrolate -formoterol  (BREZTRI  AEROSPHERE) 160-9-4.8 MCG/ACT AERO inhaler Inhale 2 puffs into the lungs 2 (two) times daily. 10.7 g 11   citalopram  (CELEXA ) 20 MG tablet Take 1 tablet (20 mg total) by mouth daily. 90 tablet 3   cycloSPORINE  (RESTASIS ) 0.05 % ophthalmic emulsion Place 1 drop into both eyes 2 (two) times daily.     DIFLUCAN  100 MG tablet Take 1 tablet (100 mg total) by mouth daily. 30 tablet 0   empagliflozin  (JARDIANCE ) 25 MG TABS tablet TAKE 1 TABLET BY MOUTH EVERY MORNING BEFORE BREAKFAST 90 tablet 3   ezetimibe  (ZETIA ) 10 MG  tablet Take 1 tablet (10 mg total) by mouth daily. 90 tablet 3   gabapentin  (NEURONTIN ) 600 MG tablet TAKE ONE (1) TABLET THREE (3) TIMES EACH DAY 270 tablet 3   glucose blood (ACCU-CHEK GUIDE TEST) test strip Use up to four times daily as directed. 100 each 0   Lancet Device MISC Use up to four times daily as directed. 1 each 0   Lubricants (K-Y LUBRICATING) GEL Apply 1 g topically daily. 120 g 5   nitroGLYCERIN  (NITROSTAT ) 0.4 MG SL tablet Place 1 tablet (0.4 mg total) under the tongue every 5 (five) minutes x 3 doses as needed (if no relief after 3 dose proceed to ED or call 911). 25 tablet 2   ondansetron  (ZOFRAN ) 4 MG tablet Take 1 tablet (4 mg total) by mouth every 8 (eight) hours as needed for nausea or vomiting. 10 tablet 0   pantoprazole  (PROTONIX ) 40 MG tablet TAKE ONE (1) TABLET EACH DAY 90 tablet 3   rosuvastatin  (CRESTOR ) 5 MG tablet Take 1 tablet (5 mg total) by mouth daily. Takes one every other day 45 tablet 3   Semaglutide , 1 MG/DOSE, (OZEMPIC , 1 MG/DOSE,) 4 MG/3ML SOPN Inject 1 mg into the skin once a week. 9 mL 3   Semaglutide , 1 MG/DOSE, 4 MG/3ML SOPN Inject 1 mg as directed once a week 3 mL 6   Current Facility-Administered Medications  Medication Dose Route Frequency Provider Last Rate Last Admin   cyanocobalamin  (VITAMIN B12) injection 1,000 mcg  1,000 mcg Intramuscular Q30 days [name], Fonda LABOR, MD   1,000 mcg at 07/29/24 1425   Allergies:  Fenofibrate, Penicillin g, Pravastatin, and Statins   Social History: The patient  reports that she has been smoking cigarettes. She started smoking about 52 years ago. She has a 13 pack-year smoking history. She has never used smokeless tobacco. She reports that she does not drink alcohol  and does not use drugs.   Family History: The patient's family history includes Alcohol  abuse in her brother; Arthritis in her son; Atrial fibrillation in her father; Cancer - Colon in her maternal grandmother; Celiac disease in her father and son;  Dementia in her maternal grandmother and mother; Diabetes in her mother; Emphysema in her father; Esophageal cancer in her maternal uncle; GI problems in her father; Heart attack in her paternal grandmother; Heart disease in her father and maternal grandfather; Hypertension in her brother and son; Other in her father, paternal grandfather, and sister; Parkinson's disease in her brother; Stroke in her maternal grandfather; Thyroid  disease in her mother; Ulcers in her father.   ROS:  Please see the history of present illness. Otherwise, complete review of systems is positive for none  All other systems are reviewed and negative.   Physical Exam: VS:  BP 128/78  Pulse 79   Ht 5' 10 (1.778 m)   Wt 228 lb (103.4 kg)   SpO2 97%   BMI 32.71 kg/m , BMI Body mass index is 32.71 kg/m.  Wt Readings from Last 3 Encounters:  08/17/24 228 lb (103.4 kg)  07/22/24 223 lb (101.2 kg)  06/15/24 222 lb (100.7 kg)    General: Patient appears comfortable at rest. HEENT: Conjunctiva and lids normal, oropharynx clear with moist mucosa. Neck: Supple, no elevated JVP or carotid bruits, no thyromegaly. Lungs: Clear to auscultation, nonlabored breathing at rest. Cardiac: Regular rate and rhythm, no S3 or significant systolic murmur, no pericardial rub. Abdomen: Soft, nontender, no hepatomegaly, bowel sounds present, no guarding or rebound. Extremities: No pitting edema, distal pulses 2+. Skin: Warm and dry. Musculoskeletal: No kyphosis. Neuropsychiatric: Alert and oriented x3, affect grossly appropriate.  Recent Labwork: 03/11/2024: ALT 17; AST 11; Hemoglobin 15.1; Platelets 236; Potassium 4.1; Sodium 142 06/03/2024: BUN 10; Creat 0.78     Component Value Date/Time   CHOL 173 03/11/2024 1104   CHOL 219 (H) 01/10/2013 0900   TRIG 105 03/11/2024 1104   TRIG 121 01/10/2015 0955   TRIG 134 01/10/2013 0900   HDL 61 03/11/2024 1104   HDL 43 01/10/2015 0955   HDL 37 (L) 01/10/2013 0900   CHOLHDL 2.8  03/11/2024 1104   CHOLHDL 6.4 12/10/2010 0608   VLDL 45 (H) 12/10/2010 0608   LDLCALC 93 03/11/2024 1104   LDLCALC 148 (H) 03/20/2014 0826   LDLCALC 155 (H) 01/10/2013 0900      Assessment and Plan:  Imaging evidence of coronary artery calcifications and cardiac chest pain: Ongoing exertional chest pain for the last 1 year but in the last 4 months, reported worsening exertional chest pain, occurring once a week and lasting between 5 and 15 minutes.  Resolves with rest.  Not active at baseline.  She also noticed worsening DOE in the last 4 months.  She was on Ozempic  (from the patient assistance program) however 4 months ago, this had to be discontinued and she gained 18 pounds weight.  Unclear if she is feeling short of breath from weight gain, obtain BNP today.  Start metoprolol  tartrate 25 mg twice daily today for exertional chest pain.  Echo and Lexiscan  normal in January 2025 however due to concern for balanced ischemia from DM 2, will obtain CT cardiac for definitive evaluation.  HLD, at goal: Continue rosuvastatin  5 mg p.o at bedtime.  Goal LDL less than 100.  Nicotine abuse: Current smoker.  Counseling.  OSA on CPAP: Continue CPAP.   I spent 30 minutes reviewing primary care records, reports, more than the labs, discussion and documentation  Medication Adjustments/Labs and Tests Ordered: Current medicines are reviewed at length with the patient today.  Concerns regarding medicines are outlined above.    Disposition:  Follow up in 3 months, March end 2026  Signed Mercy Leppla Arleta Maywood, MD, 08/17/2024 10:08 AM    [facility] at [facility] [address] [place], [place], [place] [address]  "

## 2024-08-17 NOTE — Patient Instructions (Addendum)
 Medication Instructions:  Your physician has recommended you make the following change in your medication:  Start taking Metoprolol  Tartrate 25 mg twice daily  Continue taking all other medications as prescribed  Labwork: BNP and BMET to be completed at [facility]  Testing/Procedures:   Your cardiac CT will be scheduled at one of the below locations:   [facility] [address] [place], [place] [address] [phone] (Severe contrast allergies only)  OR   [facility] [address]  [place], [place] [address]  If scheduled at [facility], please arrive at the [facility] and Children's Entrance (Entrance C2) of [facility] 30 minutes prior to test start time. You can use the FREE valet parking offered at entrance C (encouraged to control the heart rate for the test)  Proceed to the [facility] Radiology Department (first floor) to check-in and test prep.  All radiology patients and guests should use entrance C2 at [facility], accessed from [facility], even though the hospital's physical address listed is [address]  If scheduled at the Heart and Vascular Tower at [address], please enter the parking lot using the [address] entrance and use the FREE valet service at the patient drop-off area. Enter the building and check-in with registration on the main floor.  There is spacious parking and easy access to the radiology department from the [facility] V[facility] entrance. Please enter here and check-in with the desk attendant.   If scheduled at [facility], please arrive 30 minutes early for check-in and test prep.  Please follow these instructions carefully (unless otherwise directed):  An IV will be required for this test and Nitroglycerin  will be given.  Hold all erectile dysfunction medications at least 3 days (72 hrs) prior to test. (Ie viagra, cialis, sildenafil,  tadalafil, etc)   On the Night Before the Test: Be sure to Drink plenty of water . Do not consume any caffeinated/decaffeinated beverages or chocolate 12 hours prior to your test. Do not take any antihistamines 12 hours prior to your test.  If the patient has contrast allergy: No allergy   On the Day of the Test: Drink plenty of water  until 1 hour prior to the test. Do not eat any food 1 hour prior to test. You may take your regular medications prior to the test.  Take Metoprolol  (Lopressor ) two hours prior to test.(4 tablets of the 25 mg) If you take Furosemide /Hydrochlorothiazide/Spironolactone/Chlorthalidone, please HOLD on the morning of the test. Patients who wear a continuous glucose monitor MUST remove the device prior to scanning. FEMALES- please wear underwire-free bra if available, avoid dresses & tight clothing      After the Test: Drink plenty of water . After receiving IV contrast, you may experience a mild flushed feeling. This is normal. On occasion, you may experience a mild rash up to 24 hours after the test. This is not dangerous. If this occurs, you can take Benadryl  25 mg, Zyrtec, Claritin, or Allegra  and increase your fluid intake. (Patients taking Tikosyn should avoid Benadryl , and may take Zyrtec, Claritin, or Allegra ) If you experience trouble breathing, this can be serious. If it is severe call 911 IMMEDIATELY. If it is mild, please call our office.  We will call to schedule your test 2-4 weeks out understanding that some insurance companies will need an authorization prior to the service being performed.   For more information and frequently asked questions, please visit  our website : [email or web address]  For non-scheduling related questions, please contact the cardiac imaging nurse navigator should you have any questions/concerns: Cardiac Imaging Nurse Navigators Direct Office Dial: [phone]   For scheduling needs, including cancellations  and rescheduling, please call Brittany, [phone].   Follow-Up: Your physician recommends that you schedule a follow-up appointment in: 3 months  Any Other Special Instructions Will Be Listed Below (If Thank you for choosing [facility]!    Applicable).  If you need a refill on your cardiac medications before your next appointment, please call your pharmacy.

## 2024-08-18 ENCOUNTER — Ambulatory Visit

## 2024-08-18 ENCOUNTER — Other Ambulatory Visit (HOSPITAL_BASED_OUTPATIENT_CLINIC_OR_DEPARTMENT_OTHER): Payer: Self-pay

## 2024-08-19 ENCOUNTER — Other Ambulatory Visit

## 2024-08-21 LAB — BASIC METABOLIC PANEL WITH GFR
BUN/Creatinine Ratio: 12 (ref 12–28)
BUN: 9 mg/dL (ref 8–27)
CO2: 27 mmol/L (ref 20–29)
Calcium: 9.1 mg/dL (ref 8.7–10.3)
Chloride: 103 mmol/L (ref 96–106)
Creatinine, Ser: 0.78 mg/dL (ref 0.57–1.00)
Glucose: 100 mg/dL — ABNORMAL HIGH (ref 70–99)
Potassium: 4.3 mmol/L (ref 3.5–5.2)
Sodium: 144 mmol/L (ref 134–144)
eGFR: 84 mL/min/1.73

## 2024-08-21 LAB — BRAIN NATRIURETIC PEPTIDE: BNP: 22.9 pg/mL (ref 0.0–100.0)

## 2024-08-22 ENCOUNTER — Ambulatory Visit: Payer: Self-pay | Admitting: Internal Medicine

## 2024-08-30 ENCOUNTER — Telehealth (HOSPITAL_COMMUNITY): Payer: Self-pay | Admitting: *Deleted

## 2024-08-30 ENCOUNTER — Ambulatory Visit: Admitting: *Deleted

## 2024-08-30 DIAGNOSIS — E538 Deficiency of other specified B group vitamins: Secondary | ICD-10-CM

## 2024-08-30 NOTE — Telephone Encounter (Signed)

## 2024-08-30 NOTE — Progress Notes (Signed)
 Patient is in office today for a nurse visit for B12 Injection. Patient Injection was given in the  Left deltoid. Patient tolerated injection well.

## 2024-08-31 ENCOUNTER — Ambulatory Visit (HOSPITAL_COMMUNITY)
Admission: RE | Admit: 2024-08-31 | Discharge: 2024-08-31 | Disposition: A | Discharge status: Home / Self Care | Source: Ambulatory Visit | Attending: Cardiology | Admitting: Cardiology

## 2024-08-31 DIAGNOSIS — I2510 Atherosclerotic heart disease of native coronary artery without angina pectoris: Secondary | ICD-10-CM | POA: Insufficient documentation

## 2024-08-31 DIAGNOSIS — R079 Chest pain, unspecified: Secondary | ICD-10-CM | POA: Insufficient documentation

## 2024-08-31 DIAGNOSIS — R931 Abnormal findings on diagnostic imaging of heart and coronary circulation: Secondary | ICD-10-CM | POA: Insufficient documentation

## 2024-08-31 MED ORDER — NITROGLYCERIN 0.4 MG SL SUBL
0.8000 mg | SUBLINGUAL_TABLET | Freq: Once | SUBLINGUAL | Status: AC
  Administered 2024-08-31: 0.8 mg via SUBLINGUAL

## 2024-08-31 MED ORDER — IOHEXOL 350 MG/ML SOLN
100.0000 mL | Freq: Once | INTRAVENOUS | Status: AC | PRN
  Administered 2024-08-31: 100 mL via INTRAVENOUS

## 2024-09-01 ENCOUNTER — Other Ambulatory Visit: Payer: Self-pay | Admitting: Cardiology

## 2024-09-01 ENCOUNTER — Ambulatory Visit (HOSPITAL_BASED_OUTPATIENT_CLINIC_OR_DEPARTMENT_OTHER)
Admission: RE | Admit: 2024-09-01 | Discharge: 2024-09-01 | Disposition: A | Discharge status: Home / Self Care | Source: Ambulatory Visit | Attending: Cardiology | Admitting: Cardiology

## 2024-09-01 DIAGNOSIS — R931 Abnormal findings on diagnostic imaging of heart and coronary circulation: Secondary | ICD-10-CM | POA: Diagnosis not present

## 2024-09-08 ENCOUNTER — Other Ambulatory Visit (HOSPITAL_BASED_OUTPATIENT_CLINIC_OR_DEPARTMENT_OTHER): Payer: Self-pay

## 2024-09-08 ENCOUNTER — Encounter: Payer: Self-pay | Admitting: Family Medicine

## 2024-09-08 ENCOUNTER — Ambulatory Visit (INDEPENDENT_AMBULATORY_CARE_PROVIDER_SITE_OTHER): Admitting: Family Medicine

## 2024-09-08 VITALS — BP 113/61 | HR 68 | Ht 70.0 in | Wt 229.0 lb

## 2024-09-08 DIAGNOSIS — J449 Chronic obstructive pulmonary disease, unspecified: Secondary | ICD-10-CM | POA: Diagnosis not present

## 2024-09-08 DIAGNOSIS — J4489 Other specified chronic obstructive pulmonary disease: Secondary | ICD-10-CM | POA: Diagnosis not present

## 2024-09-08 DIAGNOSIS — E782 Mixed hyperlipidemia: Secondary | ICD-10-CM

## 2024-09-08 DIAGNOSIS — Z7985 Long-term (current) use of injectable non-insulin antidiabetic drugs: Secondary | ICD-10-CM | POA: Diagnosis not present

## 2024-09-08 DIAGNOSIS — E1169 Type 2 diabetes mellitus with other specified complication: Secondary | ICD-10-CM | POA: Diagnosis not present

## 2024-09-08 DIAGNOSIS — E66811 Obesity, class 1: Secondary | ICD-10-CM | POA: Diagnosis not present

## 2024-09-08 LAB — BAYER DCA HB A1C WAIVED: HB A1C (BAYER DCA - WAIVED): 6.7 % — ABNORMAL HIGH (ref 4.8–5.6)

## 2024-09-08 NOTE — Progress Notes (Signed)
 "  BP 113/61   Pulse 68   Ht 5' 10 (1.778 m)   Wt 229 lb (103.9 kg)   SpO2 97%   BMI 32.86 kg/m    Subjective:   Patient ID: [name], female    DOB: 1955-10-03, 69 y.o.   MRN: [id number]  HPI: [name] is a 69 y.o. female presenting on 09/08/2024 for Medical Management of Chronic Issues, Diabetes, and Hyperlipidemia   Discussed the use of AI scribe software for clinical note transcription with the patient, who gave verbal consent to proceed.  History of Present Illness   [name] is a 69 year old female with diabetes and coronary artery disease who presents for a recheck of her conditions.  Glycemic control - Blood glucose levels fluctuating between 103 mg/dL and 872 mg/dL - Currently on Ozempic , resumed after lapse due to insurance issues - Three Ozempic  injections administered since restarting  Coronary artery disease and cardiac symptoms - Recent cardiac testing revealed one artery with 59% stenosis and two arteries with >50% stenosis - On metoprolol , heart rate reduced to 60 bpm - Fatigue present - Headache following nitroglycerin  administration  Respiratory symptoms - Continues Symbicort  for breathing issues - Postnasal drip persists  Candidiasis - Thrush present in mouth, esophagus, under breast, and lower areas - Onset associated with recent antibiotic course for dental abscess - Treated with Lotrimin  Gastrointestinal findings - Recent GI evaluation with removal and biopsy of seven polyps, all benign - Stomach inflammation identified during GI evaluation, etiology unknown          Relevant past medical, surgical, family and social history reviewed and updated as indicated. Interim medical history since our last visit reviewed. Allergies and medications reviewed and updated.  Review of Systems  Constitutional:  Negative for chills and fever.  Eyes:  Negative for redness and visual disturbance.  Respiratory:  Negative for chest  tightness and shortness of breath.   Cardiovascular:  Negative for chest pain and leg swelling.  Genitourinary:  Negative for difficulty urinating and dysuria.  Musculoskeletal:  Negative for back pain and gait problem.  Skin:  Negative for rash.  Neurological:  Negative for light-headedness and headaches.  Psychiatric/Behavioral:  Negative for agitation and behavioral problems.   All other systems reviewed and are negative.   Per HPI unless specifically indicated above   Allergies as of 09/08/2024       Reactions   Fenofibrate Other (See Comments)   Flu-like    Penicillin G Other (See Comments)   Blisters in mouth   Pravastatin Other (See Comments)   flu like symptoms    Statins Other (See Comments)   Flu like symptoms        Medication List        Accurate as of September 08, 2024  2:33 PM. If you have any questions, ask your nurse or doctor.          STOP taking these medications    Diflucan  100 MG tablet Generic drug: fluconazole  Stopped by: Fonda Levins, MD       TAKE these medications    Accu-Chek Guide Test test strip Generic drug: glucose blood Use up to four times daily as directed.   Accu-Chek Guide w/Device Kit Use up to four times daily as directed.   Accu-Chek Softclix Lancets lancets Use up to four times daily as directed.   albuterol  (2.5 MG/3ML) 0.083% nebulizer solution Commonly known as: PROVENTIL  Take 3 mLs (2.5 mg total)  by nebulization every 6 (six) hours as needed for wheezing or shortness of breath.   albuterol  108 (90 Base) MCG/ACT inhaler Commonly known as: Ventolin  HFA Inhale 2 puffs into the lungs every 6 (six) hours as needed. For shortness of breath   Breztri  Aerosphere 160-9-4.8 MCG/ACT Aero inhaler Generic drug: budesonide -glycopyrrolate -formoterol  Inhale 2 puffs into the lungs 2 (two) times daily.   citalopram  20 MG tablet Commonly known as: CELEXA  Take 1 tablet (20 mg total) by mouth daily.   cycloSPORINE   0.05 % ophthalmic emulsion Commonly known as: RESTASIS  Place 1 drop into both eyes 2 (two) times daily.   ezetimibe  10 MG tablet Commonly known as: ZETIA  Take 1 tablet (10 mg total) by mouth daily.   gabapentin  600 MG tablet Commonly known as: NEURONTIN  TAKE ONE (1) TABLET THREE (3) TIMES EACH DAY   Jardiance  25 MG Tabs tablet Generic drug: empagliflozin  TAKE 1 TABLET BY MOUTH EVERY MORNING BEFORE BREAKFAST   K-Y Lubricating Gel Apply 1 g topically daily.   Lancet Device Misc Use up to four times daily as directed.   metoprolol  tartrate 25 MG tablet Commonly known as: LOPRESSOR  Take 1 tablet (25 mg total) by mouth 2 (two) times daily.   nitroGLYCERIN  0.4 MG SL tablet Commonly known as: Nitrostat  Place 1 tablet (0.4 mg total) under the tongue every 5 (five) minutes x 3 doses as needed (if no relief after 3 dose proceed to ED or call 911).   ondansetron  4 MG tablet Commonly known as: ZOFRAN  Take 1 tablet (4 mg total) by mouth every 8 (eight) hours as needed for nausea or vomiting.   pantoprazole  40 MG tablet Commonly known as: PROTONIX  TAKE ONE (1) TABLET EACH DAY   rosuvastatin  5 MG tablet Commonly known as: Crestor  Take 1 tablet (5 mg total) by mouth daily. Takes one every other day   Ozempic  (1 MG/DOSE) 4 MG/3ML Sopn Generic drug: Semaglutide  (1 MG/DOSE) Inject 1 mg into the skin once a week.   Semaglutide  (1 MG/DOSE) 4 MG/3ML Sopn Inject 1 mg as directed once a week         Objective:   BP 113/61   Pulse 68   Ht 5' 10 (1.778 m)   Wt 229 lb (103.9 kg)   SpO2 97%   BMI 32.86 kg/m   Wt Readings from Last 3 Encounters:  09/08/24 229 lb (103.9 kg)  08/17/24 228 lb (103.4 kg)  07/22/24 223 lb (101.2 kg)    Physical Exam Vitals and nursing note reviewed.  Constitutional:      General: She is not in acute distress.    Appearance: She is well-developed. She is not diaphoretic.  Eyes:     Conjunctiva/sclera: Conjunctivae normal.  Cardiovascular:      Rate and Rhythm: Normal rate and regular rhythm.     Heart sounds: Normal heart sounds. No murmur heard. Pulmonary:     Effort: Pulmonary effort is normal. No respiratory distress.     Breath sounds: Normal breath sounds. No wheezing.  Musculoskeletal:        General: No swelling. Normal range of motion.  Skin:    General: Skin is warm and dry.     Findings: No rash.  Neurological:     Mental Status: She is alert and oriented to person, place, and time.     Coordination: Coordination normal.  Psychiatric:        Behavior: Behavior normal.    Physical Exam   VITALS: P- 60, BP- 113/61 CHEST:  Lungs clear to auscultation bilaterally. CARDIOVASCULAR: Regular heart sounds, no abnormalities.         Assessment & Plan:   Problem List Items Addressed This Visit       Respiratory   COPD with chronic bronchitis (HCC)   COPD  GOLD II/ AB     Endocrine   Type 2 diabetes mellitus with other specified complication (HCC) - Primary   Relevant Orders   Bayer DCA Hb A1c Waived   CBC with Differential/Platelet   CMP14+EGFR   Lipid panel   TSH   Vitamin B12     Other   Mixed hyperlipidemia (Chronic)   Relevant Orders   Bayer DCA Hb A1c Waived   CBC with Differential/Platelet   CMP14+EGFR   Lipid panel   TSH   Morbid obesity (HCC)      Type 2 diabetes mellitus with other specified complication Blood glucose levels fluctuate between 103 and 127 mg/dL. Discontinued Ozempic  due to insurance issues. Trulicity may be an alternative. - Contact [facility] or [facility] to discuss switching to Trulicity due to recent changes in assistance program eligibility.  Chronic obstructive pulmonary disease Continues Symbicort  inhaler use. Reports postmenopausal drip and thrush, likely related to inhaler use. Recent antibiotics for dental abscess may have exacerbated thrush. - Continue using Symbicort  inhaler. - Monitor for recurrence of thrush and report if it occurs in the mouth or  throat.  Atherosclerotic heart disease of native coronary artery Recent evaluation showed significant arterial blockage. Metoprolol  reduced heart rate and improved symptoms. Nitroglycerin  caused headache. - Continue metoprolol  for heart rate management and symptom improvement.  Oral and cutaneous candidiasis Experiencing thrush in mouth, esophagus, and skin. Using Lotrimin for skin. Recent antibiotics may have contributed to recurrence. - Continue using Lotrimin for skin thrush. - Report if thrush recurs in the mouth or throat for potential oral treatment options.      A1c looks decent at 6.7, no changes    Follow up plan: Return in about 3 months (around 12/07/2024), or if symptoms worsen or fail to improve, for Diabetes recheck.  Counseling provided for all of the vaccine components Orders Placed This Encounter  Procedures   Bayer DCA Hb A1c Waived   CBC with Differential/Platelet   CMP14+EGFR   Lipid panel   TSH   Vitamin B12    Fonda Levins, MD [facility] 09/08/2024, 2:33 PM     "

## 2024-09-09 LAB — CBC WITH DIFFERENTIAL/PLATELET
Basophils Absolute: 0.1 10*3/uL (ref 0.0–0.2)
Basos: 1 %
EOS (ABSOLUTE): 0.2 10*3/uL (ref 0.0–0.4)
Eos: 2 %
Hematocrit: 45.5 % (ref 34.0–46.6)
Hemoglobin: 14.7 g/dL (ref 11.1–15.9)
Immature Grans (Abs): 0 10*3/uL (ref 0.0–0.1)
Immature Granulocytes: 0 %
Lymphocytes Absolute: 2.6 10*3/uL (ref 0.7–3.1)
Lymphs: 28 %
MCH: 29.3 pg (ref 26.6–33.0)
MCHC: 32.3 g/dL (ref 31.5–35.7)
MCV: 91 fL (ref 79–97)
Monocytes Absolute: 0.4 10*3/uL (ref 0.1–0.9)
Monocytes: 5 %
Neutrophils Absolute: 5.9 10*3/uL (ref 1.4–7.0)
Neutrophils: 64 %
Platelets: 265 10*3/uL (ref 150–450)
RBC: 5.02 x10E6/uL (ref 3.77–5.28)
RDW: 13.1 % (ref 11.7–15.4)
WBC: 9.2 10*3/uL (ref 3.4–10.8)

## 2024-09-09 LAB — LIPID PANEL
Chol/HDL Ratio: 2.9 ratio (ref 0.0–4.4)
Cholesterol, Total: 190 mg/dL (ref 100–199)
HDL: 65 mg/dL
LDL Chol Calc (NIH): 104 mg/dL — ABNORMAL HIGH (ref 0–99)
Triglycerides: 117 mg/dL (ref 0–149)
VLDL Cholesterol Cal: 21 mg/dL (ref 5–40)

## 2024-09-09 LAB — CMP14+EGFR
ALT: 12 [IU]/L (ref 0–32)
AST: 15 [IU]/L (ref 0–40)
Albumin: 4 g/dL (ref 3.9–4.9)
Alkaline Phosphatase: 85 [IU]/L (ref 49–135)
BUN/Creatinine Ratio: 11 — ABNORMAL LOW (ref 12–28)
BUN: 9 mg/dL (ref 8–27)
Bilirubin Total: 0.5 mg/dL (ref 0.0–1.2)
CO2: 26 mmol/L (ref 20–29)
Calcium: 9 mg/dL (ref 8.7–10.3)
Chloride: 105 mmol/L (ref 96–106)
Creatinine, Ser: 0.84 mg/dL (ref 0.57–1.00)
Globulin, Total: 1.8 g/dL (ref 1.5–4.5)
Glucose: 118 mg/dL — ABNORMAL HIGH (ref 70–99)
Potassium: 4.6 mmol/L (ref 3.5–5.2)
Sodium: 144 mmol/L (ref 134–144)
Total Protein: 5.8 g/dL — ABNORMAL LOW (ref 6.0–8.5)
eGFR: 77 mL/min/{1.73_m2}

## 2024-09-09 LAB — VITAMIN B12: Vitamin B-12: 612 pg/mL (ref 232–1245)

## 2024-09-09 LAB — TSH: TSH: 6.33 u[IU]/mL — ABNORMAL HIGH (ref 0.450–4.500)

## 2024-09-12 ENCOUNTER — Ambulatory Visit: Payer: Self-pay | Admitting: Family Medicine

## 2024-09-15 ENCOUNTER — Ambulatory Visit: Admitting: Family Medicine

## 2024-09-19 ENCOUNTER — Ambulatory Visit

## 2024-09-30 ENCOUNTER — Ambulatory Visit

## 2024-11-24 ENCOUNTER — Ambulatory Visit: Admitting: Internal Medicine

## 2024-12-07 ENCOUNTER — Ambulatory Visit: Admitting: Family Medicine
